# Patient Record
Sex: Female | Born: 1975 | Race: Black or African American | Hispanic: No | Marital: Single | Smoking: Never smoker
Health system: Southern US, Community
[De-identification: ages and names within clinical notes are randomized; demographics above are authoritative.]

## PROBLEM LIST (undated history)

## (undated) ENCOUNTER — Ambulatory Visit: Discharge status: Absent without leave | Payer: BC Managed Care – PPO

## (undated) DIAGNOSIS — E278 Other specified disorders of adrenal gland: Secondary | ICD-10-CM

## (undated) DIAGNOSIS — M549 Dorsalgia, unspecified: Secondary | ICD-10-CM

## (undated) DIAGNOSIS — M2550 Pain in unspecified joint: Secondary | ICD-10-CM

## (undated) DIAGNOSIS — E282 Polycystic ovarian syndrome: Secondary | ICD-10-CM

## (undated) DIAGNOSIS — G43009 Migraine without aura, not intractable, without status migrainosus: Secondary | ICD-10-CM

## (undated) DIAGNOSIS — N6001 Solitary cyst of right breast: Secondary | ICD-10-CM

## (undated) DIAGNOSIS — Z803 Family history of malignant neoplasm of breast: Secondary | ICD-10-CM

## (undated) DIAGNOSIS — C801 Malignant (primary) neoplasm, unspecified: Secondary | ICD-10-CM

## (undated) DIAGNOSIS — E559 Vitamin D deficiency, unspecified: Secondary | ICD-10-CM

## (undated) DIAGNOSIS — R87619 Unspecified abnormal cytological findings in specimens from cervix uteri: Secondary | ICD-10-CM

## (undated) DIAGNOSIS — M7989 Other specified soft tissue disorders: Secondary | ICD-10-CM

## (undated) DIAGNOSIS — K5900 Constipation, unspecified: Secondary | ICD-10-CM

## (undated) DIAGNOSIS — E7800 Pure hypercholesterolemia, unspecified: Secondary | ICD-10-CM

## (undated) DIAGNOSIS — R7303 Prediabetes: Secondary | ICD-10-CM

## (undated) DIAGNOSIS — B009 Herpesviral infection, unspecified: Secondary | ICD-10-CM

## (undated) HISTORY — PX: UMBILICAL HERNIA REPAIR: SHX196

## (undated) HISTORY — DX: Pure hypercholesterolemia, unspecified: E78.00

## (undated) HISTORY — DX: Family history of malignant neoplasm of breast: Z80.3

## (undated) HISTORY — DX: Migraine without aura, not intractable, without status migrainosus: G43.009

## (undated) HISTORY — DX: Herpesviral infection, unspecified: B00.9

## (undated) HISTORY — PX: COLPOSCOPY: SHX161

## (undated) HISTORY — DX: Dorsalgia, unspecified: M54.9

## (undated) HISTORY — DX: Solitary cyst of right breast: N60.01

## (undated) HISTORY — DX: Constipation, unspecified: K59.00

## (undated) HISTORY — DX: Other specified soft tissue disorders: M79.89

## (undated) HISTORY — DX: Vitamin D deficiency, unspecified: E55.9

## (undated) HISTORY — DX: Pain in unspecified joint: M25.50

## (undated) HISTORY — DX: Polycystic ovarian syndrome: E28.2

## (undated) HISTORY — DX: Unspecified abnormal cytological findings in specimens from cervix uteri: R87.619

## (undated) HISTORY — DX: Other specified disorders of adrenal gland: E27.8

---

## 2014-06-02 DIAGNOSIS — R87619 Unspecified abnormal cytological findings in specimens from cervix uteri: Secondary | ICD-10-CM

## 2014-06-02 HISTORY — DX: Unspecified abnormal cytological findings in specimens from cervix uteri: R87.619

## 2014-06-17 ENCOUNTER — Telehealth: Payer: Self-pay | Admitting: Obstetrics and Gynecology

## 2014-06-17 DIAGNOSIS — R87612 Low grade squamous intraepithelial lesion on cytologic smear of cervix (LGSIL): Secondary | ICD-10-CM

## 2014-06-17 NOTE — Telephone Encounter (Signed)
Pre-cert complete

## 2014-06-17 NOTE — Telephone Encounter (Signed)
Order placed for colposcopy.

## 2014-06-17 NOTE — Telephone Encounter (Signed)
Triage, Please enter order for colposcopy.  Thank you

## 2014-06-17 NOTE — Telephone Encounter (Signed)
Thank you for the update.  [place].

## 2014-06-17 NOTE — Telephone Encounter (Signed)
I called the referring doctor's office, [facility], to make sure the patient is aware she will be having a colposcopy at her new patient appointment. The doctor's nurse, Kathlee Nations, will be calling the patient to inform her of the procedure. Kathlee Nations will then call us back to confirm with [place] so we can schedule the patient.  Routing to [place] for pre-authorization per Dr. Quincy Simmonds.  Cc: Dr. Quincy Simmonds for [place].

## 2014-06-18 NOTE — Telephone Encounter (Signed)
Spoke with patient. Advised of message as seen below from [place]. Patient is agreeable. NGYN appointment scheduled for 07/01/2014 at [facility] with Dr.Silva. Patient is agreeable to date and time. Address provided to office.  Routing to provider for final review. Patient agreeable to disposition. Will close encounter

## 2014-06-18 NOTE — Telephone Encounter (Signed)
Spoke with patient. Patient is not currently on any form of birth control. Advised patient will need to call with first day of next menses to schedule NGYN appointment with colposcopy. "My cycles are really irregular. I had my last period right before my pap and before that I had not had my cycle in almost a year. I have no idea when my next one will be." Advised patient will speak with Dr.Silva regarding scheduling with cycles and return call to get appointment set up. Patient is agreeable.

## 2014-06-18 NOTE — Telephone Encounter (Signed)
Kathlee Nations from [facility] called to let our office know the patient is now aware she will be having a colposcopy procedure in our office when she comes in as a new patient. Routing to [place] to relay pre-cert information to patient.

## 2014-06-18 NOTE — Telephone Encounter (Signed)
Gail Erickson is to contact patient for scheduling as procedure needs to be scheduled based on cycle. Patient made aware of pre-cert details.

## 2014-06-18 NOTE — Telephone Encounter (Signed)
Schedule new GYN appointment first - history and exam.  I will need to understand her medical status first before we can schedule the colposcopy.

## 2014-06-25 ENCOUNTER — Telehealth: Payer: Self-pay | Admitting: Obstetrics and Gynecology

## 2014-06-25 NOTE — Telephone Encounter (Signed)
Spoke with patient. Advised patient okay to keep [place] appointment as scheduled for 07/01/2014 with Dr.Silva. Advised we will not be performing colposcopy at time of this appointment.  Advised this appointment will allow her to get to meet Dr.Silva and for them to review her medical history and discuss colposcopy. Patient is agreeable. "Did they only send my pap results to you? Or did they send my labs and mammogram history over as well?" Advised patient will speak with front desk supervisor regarding records that have been sent and return call tomorrow to let her know what we have on file. Patient is agreeable.

## 2014-06-25 NOTE — Telephone Encounter (Signed)
Pt states she was told to call back on first day of her cycle - pt started today. Pt is scheduled for NGYN referral for colpo. Is her scheduled appt on 4/20 still ok or does she need to reschedule sooner?

## 2014-06-26 NOTE — Telephone Encounter (Signed)
Left message to call [place] at [phone].  Need to advise. I have reviewed records received from referring office which include pap results only.

## 2014-06-29 NOTE — Telephone Encounter (Signed)
Spoke with patient. Advised patient I have reviewed incoming records from referring office and we have only received her pap smear. Patient will call to referring office to have other records sent as she would like Dr.Silva to have this on file. Advised once we have received these we will add them to her chart. Patient is agreeable.  Routing to provider for final review. Patient agreeable to disposition. Will close encounter

## 2014-07-01 ENCOUNTER — Encounter: Payer: Self-pay | Admitting: Obstetrics and Gynecology

## 2014-07-01 ENCOUNTER — Ambulatory Visit (INDEPENDENT_AMBULATORY_CARE_PROVIDER_SITE_OTHER): Payer: BLUE CROSS/BLUE SHIELD | Admitting: Obstetrics and Gynecology

## 2014-07-01 VITALS — BP 110/76 | HR 70 | Resp 20 | Ht 68.0 in | Wt 228.8 lb

## 2014-07-01 DIAGNOSIS — R87612 Low grade squamous intraepithelial lesion on cytologic smear of cervix (LGSIL): Secondary | ICD-10-CM | POA: Diagnosis not present

## 2014-07-01 DIAGNOSIS — N915 Oligomenorrhea, unspecified: Secondary | ICD-10-CM | POA: Diagnosis not present

## 2014-07-01 DIAGNOSIS — Z803 Family history of malignant neoplasm of breast: Secondary | ICD-10-CM

## 2014-07-01 NOTE — Progress Notes (Signed)
Patient ID: Gail Erickson, female   DOB: 1975/04/29, 39 y.o.   MRN: [id number] GYNECOLOGY VISIT  PCP:  Marlou Porch, MD  Referring provider:   HPI: 39 y.o.   Single  African American  female   [place]aden with Patient's last menstrual period was 06/25/2014 (exact date).   here for evaluation of abnormal pap smear.  Patient states this is her first abnormal pap smear.    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  No HR HPV testing done.   Irregular menses - June 2015, March 2016, and April 2016.  History of irregular menses life long.  At most 3 cycles per year.  No prior evaluation or hormonal testing.  If looses weight, menses are more regular.  Some acne.  No increased hair growth since doing laser to her face. Some night sweats.   Condoms/vasectomy  for contraception.   FH of breast cancer - mother age 45, maternal cousin died of breat cancer age 10, paternal aunt breast cancer in her 23s.  No family history of ovarian cancer.    GYNECOLOGIC HISTORY: Patient's last menstrual period was 06/25/2014 (exact date). Sexually active:  yes Partner preference: female Contraception:  Condoms sometimes and vasectomy.  Menopausal hormone therapy: n/a DES exposure: no   Blood transfusions:  no Sexually transmitted diseases:  no  GYN procedures and prior surgeries:  no Last mammogram: 05-29-14 dense/nl:[place], [place]               Last pap and high risk HPV testing:  LSIL:no HPV testing--in [place], [place]  History of abnormal pap smear:  no   OB History    Gravida Para Term Preterm AB TAB SAB Ectopic Multiple Living   0 0 0 0 0 0 0 0 0 0        Past Medical History  Diagnosis Date  . Cyst of right breast     --stable in 2015 U/S done in [place], [place]    Past Surgical History  Procedure Laterality Date  . Umbilical hernia repair      --as a child    Current Outpatient Prescriptions  Medication Sig Dispense Refill  . ciclopirox (PENLAC) 8 % solution Apply 1 application topically daily.  0  . Mefenamic  Acid 250 MG CAPS   2  . nystatin-triamcinolone (MYCOLOG II) cream Apply 1 application topically as needed.  0  . Vitamin D, Ergocalciferol, (DRISDOL) 50000 UNITS CAPS capsule Take 1 capsule by mouth once a week.  0   No current facility-administered medications for this visit.     ALLERGIES: Review of patient's allergies indicates no known allergies.  Family History  Problem Relation Age of Onset  . Breast cancer Mother 55  . Diabetes Mother   . Hypertension Mother   . Hyperlipidemia Mother   . Thyroid disease Mother   . Breast cancer Paternal Aunt 92    bil. Breast ca--deceased CHF  . Breast cancer Cousin 35    Dec Breast ca age 66  . Hyperlipidemia Father     History   Social History  . Marital Status: Single    Spouse Name: N/A  . Number of Children: N/A  . Years of Education: N/A   Occupational History  . Not on file.   Social History Main Topics  . Smoking status: Never Smoker   . Smokeless tobacco: Not on file  . Alcohol Use: 0.0 oz/week    0 Standard drinks or equivalent per week     Comment: 1 drink per month  .  Drug Use: No  . Sexual Activity:    Partners: Male     Comment: Condoms-sometimes   Other Topics Concern  . Not on file   Social History Narrative  . No narrative on file    ROS:  Pertinent items are noted in HPI.  PHYSICAL EXAMINATION:    BP 110/76 mmHg  Pulse 70  Resp 20  Ht 5\' 8"  (1.727 m)  Wt 228 lb 12.8 oz (103.783 kg)  BMI 34.80 kg/m2  LMP 06/25/2014 (Exact Date)   Wt Readings from Last 3 Encounters:  07/01/14 228 lb 12.8 oz (103.783 kg)     Ht Readings from Last 3 Encounters:  07/01/14 5\' 8"  (1.727 m)    General appearance: alert, cooperative and appears stated age Head: Normocephalic, without obvious abnormality, atraumatic Neck: no adenopathy, supple, symmetrical, trachea midline and thyroid not enlarged, symmetric, no tenderness/mass/nodules Lungs: clear to auscultation bilaterally Breasts: Inspection negative, No  nipple retraction or dimpling, No nipple discharge or bleeding, No axillary or supraclavicular adenopathy, Normal to palpation without dominant masses Heart: regular rate and rhythm Abdomen: soft, non-tender; no masses,  no organomegaly Extremities: extremities normal, atraumatic, no cyanosis or edema Skin: Skin color, texture, turgor normal. No rashes or lesions Lymph nodes: Cervical, supraclavicular, and axillary nodes normal. No abnormal inguinal nodes palpated Neurologic: Grossly normal  Pelvic: External genitalia:  no lesions              Urethra:  normal appearing urethra with no masses, tenderness or lesions              Bartholins and Skenes: normal                 Vagina: normal appearing vagina with normal color and discharge, no lesions              Cervix: normal appearance                 Bimanual Exam:  Uterus:  uterus is normal size, shape, consistency and nontender                                      Adnexa: normal adnexa in size, nontender and no masses                                    ASSESSMENT  LGSIL pap.  FH of breast cancer.  Oligomenorrhea.  PLAN  PCOS labs.  See EPIC.  Discussion of PCOS.  Discussion of abnormal paps and HVP. Return for colposcopy.  Procedure explained.  Referral to cancer center for genetics counseling and potential testing.    __30_____ minutes face to face time of which over 50% was spent in counseling.    An After Visit Summary was printed and given to the patient.

## 2014-07-01 NOTE — Patient Instructions (Signed)
Abnormal Pap Test Information During a Pap test, the cells on the surface of your cervix are checked to see if they look normal, abnormal, or if they show signs of having been altered by a certain type of virus called human papillomavirus, or HPV. Cervical cells that have been affected by HPV are called dysplasia. Dysplasia is not cancer, but describes abnormal cells found on the surface of the cervix. Depending on the degree of dysplasia, some of the cells may be considered pre-cancerous and may turn into cancer over time if follow up with a caregiver is delayed.  WHAT DOES AN ABNORMAL PAP TEST MEAN? Having an abnormal pap test does not mean that you have cancer. However, certain types of abnormal pap tests can be a sign that a person is at a higher risk of developing cancer. Your caregiver will want to do other tests to find out more about the abnormal cells. Your abnormal Pap test results could show:   Small and uncertain changes that should be carefully watched.   Cervical dysplasia that has caused mild changes and can be followed over time.  Cervical dysplasia that is more severe and needs to be followed and treated to ensure the problem goes away.  Cancer.  When severe cervical dysplasia is found and treated early, it rarely will grow into cancer.  WHAT WILL BE DONE ABOUT MY ABNORMAL PAP TEST?  A colposcopy may be needed. This is a procedure where your cervix is examined using light and magnification.  A small tissue sample of your cervix (biopsy) may need to be removed and then examined. This is often performed if there are areas that appear infected.  A sample of cells from the cervical canal may be removed with either a small brush or scraping instrument (curette). Based on the results of the procedures above, some caregivers may recommend either cryotherapy of the cervix or a surgical LEEP where a portion of the cervix is removed. LEEP is short for "loop electrical excisional  procedure." Rarely, a caregiver may recommend a cone biopsy.This is a procedure where a small, cone-shaped sample of your cervix is taken out. The part that is taken out is the area where the abnormal cells are.  WHAT IF I HAVE A DYSPLASIA OR A CANCER? You may be referred to a specialist. Radiation may also be a treatment for more advanced cancer. Having a hysterectomy is the last treatment option for dysplasia, but it is a more common treatment for someone with cancer. All treatment options will be discussed with you by your caregiver. WHAT SHOULD YOU DO AFTER BEING TREATED? If you have had an abnormal pap test, you should continue to have regular pap tests and check-ups as directed by your caregiver. Your cervical problem will be carefully watched so it does not get worse. Also, your caregiver can watch for, and treat, any new problems that may come up. Document Released: 06/14/2010 Document Revised: 06/24/2012 Document Reviewed: 02/23/2011 [facility] Patient Information 2015 [place], [place]. This information is not intended to replace advice given to you by your health care provider. Make sure you discuss any questions you have with your health care provider.  Polycystic Ovarian Syndrome Polycystic ovarian syndrome (PCOS) is a common hormonal disorder among women of reproductive age. Most women with PCOS grow many small cysts on their ovaries. PCOS can cause problems with your periods and make it difficult to get pregnant. It can also cause an increased risk of miscarriage with pregnancy. If left untreated, PCOS  can lead to serious health problems, such as diabetes and heart disease. CAUSES The cause of PCOS is not fully understood, but genetics may be a factor. SIGNS AND SYMPTOMS   Infrequent or no menstrual periods.   Inability to get pregnant (infertility) because of not ovulating.   Increased growth of hair on the face, chest, stomach, back, thumbs, thighs, or toes.   Acne, oily skin,  or dandruff.   Pelvic pain.   Weight gain or obesity, usually carrying extra weight around the waist.   Type 2 diabetes.   High cholesterol.   High blood pressure.   Female-pattern baldness or thinning hair.   Patches of thickened and dark brown or black skin on the neck, arms, breasts, or thighs.   Tiny excess flaps of skin (skin tags) in the armpits or neck area.   Excessive snoring and having breathing stop at times while asleep (sleep apnea).   Deepening of the voice.   Gestational diabetes when pregnant.  DIAGNOSIS  There is no single test to diagnose PCOS.   Your health care provider will:   Take a medical history.   Perform a pelvic exam.   Have ultrasonography done.   Check your female and female hormone levels.   Measure glucose or sugar levels in the blood.   Do other blood tests.   If you are producing too many female hormones, your health care provider will make sure it is from PCOS. At the physical exam, your health care provider will want to evaluate the areas of increased hair growth. Try to allow natural hair growth for a few days before the visit.   During a pelvic exam, the ovaries may be enlarged or swollen because of the increased number of small cysts. This can be seen more easily by using vaginal ultrasonography or screening to examine the ovaries and lining of the uterus (endometrium) for cysts. The uterine lining may become thicker if you have not been having a regular period.  TREATMENT  Because there is no cure for PCOS, it needs to be managed to prevent problems. Treatments are based on your symptoms. Treatment is also based on whether you want to have a baby or whether you need contraception.  Treatment may include:   Progesterone hormone to start a menstrual period.   Birth control pills to make you have regular menstrual periods.   Medicines to make you ovulate, if you want to get pregnant.   Medicines to control  your insulin.   Medicine to control your blood pressure.   Medicine and diet to control your high cholesterol and triglycerides in your blood.  Medicine to reduce excessive hair growth.  Surgery, making small holes in the ovary, to decrease the amount of female hormone production. This is done through a long, lighted tube (laparoscope) placed into the pelvis through a tiny incision in the lower abdomen.  HOME CARE INSTRUCTIONS  Only take over-the-counter or prescription medicine as directed by your health care provider.  Pay attention to the foods you eat and your activity levels. This can help reduce the effects of PCOS.  Keep your weight under control.  Eat foods that are low in carbohydrate and high in fiber.  Exercise regularly. SEEK MEDICAL CARE IF:  Your symptoms do not get better with medicine.  You have new symptoms. Document Released: 06/23/2004 Document Revised: 12/18/2012 Document Reviewed: 08/15/2012 [facility] Patient Information 2015 [place], [place]. This information is not intended to replace advice given to you by your  health care provider. Make sure you discuss any questions you have with your health care provider.  BRCA-1 and BRCA-2 BRCA-1 and BRCA-2 are 2 genes that are linked with hereditary breast and ovarian cancers. About 200,000 women are diagnosed with invasive breast cancer each year and about 23,000 with ovarian cancer (according to the [place]). Of these cancers, about 5% to 10% will be due to a mutation in one of the BRCA genes. Men can also inherit an increased risk of developing breast cancer, primarily from an alteration in the BRCA-2 gene.  Individuals with mutations in BRCA1 or BRCA2 have significantly elevated risks for breast cancer (up to 80% lifetime risk), ovarian cancer (up to 40% lifetime risk), bilateral breast cancer and other types of cancers. BRCA mutations are inherited and passed from generation to generation. One half of  the time, they are passed from the father's side of the family.  The DNA in white blood cells is used to detect mutations in the BRCA genes. While the gene products (proteins) of the BRCA genes act only in breast and ovarian tissue, the genes are present in every cell of the body and blood is the most easily accessible source of that DNA. PREPARATION FOR TEST The test for BRCA mutations is done on a blood sample collected by needle from a vein in the arm. The test does not require surgical biopsy of breast or ovarian tissue.  NORMAL FINDINGS No genetic mutations. Ranges for normal findings may vary among different laboratories and hospitals. You should always check with your doctor after having lab work or other tests done to discuss the meaning of your test results and whether your values are considered within normal limits. MEANING OF TEST  Your caregiver will go over the test results with you and discuss the importance and meaning of your results, as well as treatment options and the need for additional tests if necessary. OBTAINING THE TEST RESULTS It is your responsibility to obtain your test results. Ask the lab or department performing the test when and how you will get your results. OTHER THINGS TO KNOW Your test results may have implications for other family members. When one member of a family is tested for BRCA mutations, issues often arise about how or whether to share this information with other family members. Seek advice from a genetic counselor about communication of result with your family members.  Pre and post test consultation with a health care provider knowledgeable about genetic testing cannot be overemphasized.  There are many issues to be considered when preparing for a genetic test and upon learning the results, and a genetic counselor has the knowledge and experience to help you sort through them.  If the BRCA test is positive, the options include increased frequency of  check-ups (e.g., mammography, blood tests for CA-125, or transvaginal ultrasonography); medications that could reduce risk (e.g., oral contraceptives or tamoxifen); or surgical removal of the ovaries or breasts. There are a number of variables involved and it is important to discuss your options with your doctor and genetic counselor. Research studies have reported that for every 1000 women negative for BRCA mutations, between 12 and 28 of them will develop breast cancer by age 1 and between 76 and 46 will develop ovarian cancer by age 84. The risk increases with age. The test can be ordered by a doctor, preferably by one who can also offer genetic counseling. The blood sample will be sent to a laboratory that specializes in  BRCA testing. The American Society of Clinical Oncology and the [place] encourage women seeking the test to participate in long-term outcome studies to help gather information on the effectiveness of different check-up and treatment options. Document Released: 03/23/2004 Document Revised: 05/22/2011 Document Reviewed: 05/30/2013 [facility] Patient Information 2015 [place], [place]. This information is not intended to replace advice given to you by your health care provider. Make sure you discuss any questions you have with your health care provider.

## 2014-07-02 LAB — DHEA-SULFATE: DHEA-SO4: 389 ug/dL — ABNORMAL HIGH (ref 23–266)

## 2014-07-02 LAB — TESTOSTERONE: TESTOSTERONE: 53 ng/dL (ref 10–70)

## 2014-07-02 LAB — TSH: TSH: 0.596 u[IU]/mL (ref 0.350–4.500)

## 2014-07-02 LAB — ESTRADIOL: Estradiol: 40.5 pg/mL

## 2014-07-02 LAB — FSH/LH
FSH: 7.3 m[IU]/mL
LH: 6.1 m[IU]/mL

## 2014-07-02 LAB — HEMOGLOBIN A1C
Hgb A1c MFr Bld: 5.1 % (ref <5.7)
Mean Plasma Glucose: 100 mg/dL (ref <117)

## 2014-07-02 LAB — PROLACTIN: Prolactin: 9.2 ng/mL

## 2014-07-05 LAB — 17-HYDROXYPROGESTERONE: 17-OH-Progesterone, LC/MS/MS: 39 ng/dL

## 2014-07-09 ENCOUNTER — Telehealth: Payer: Self-pay | Admitting: Obstetrics and Gynecology

## 2014-07-09 ENCOUNTER — Telehealth: Payer: Self-pay | Admitting: *Deleted

## 2014-07-09 NOTE — Telephone Encounter (Signed)
Call to patient. Advised of benefit quote received for colpo.  Patient agreeable. Scheduled procedure.

## 2014-07-09 NOTE — Telephone Encounter (Signed)
Tried calling patient got a message that says "The mailbox is full and cannot accept new messages, goodbye".

## 2014-07-09 NOTE — Telephone Encounter (Signed)
-----   Message from [name], MD sent at 07/06/2014  7:59 PM EDT ----- Please report lab results to patient.  Her DHEAS is elevated, which can occur with polycystic ovarian disease.  All other labs are normal.   I will see patient for her colposcopy.

## 2014-07-13 ENCOUNTER — Encounter: Payer: Self-pay | Admitting: Obstetrics and Gynecology

## 2014-07-13 ENCOUNTER — Ambulatory Visit (INDEPENDENT_AMBULATORY_CARE_PROVIDER_SITE_OTHER): Payer: BLUE CROSS/BLUE SHIELD | Admitting: Obstetrics and Gynecology

## 2014-07-13 VITALS — BP 126/80 | HR 64 | Ht 68.0 in | Wt 228.0 lb

## 2014-07-13 DIAGNOSIS — R87612 Low grade squamous intraepithelial lesion on cytologic smear of cervix (LGSIL): Secondary | ICD-10-CM

## 2014-07-13 DIAGNOSIS — Z803 Family history of malignant neoplasm of breast: Secondary | ICD-10-CM | POA: Diagnosis not present

## 2014-07-13 DIAGNOSIS — N915 Oligomenorrhea, unspecified: Secondary | ICD-10-CM

## 2014-07-13 NOTE — Progress Notes (Signed)
Subjective:     Patient ID: Gail Erickson, female   DOB: 10-16-1975, 39 y.o.   MRN: [id number]  HPI  Here for colposcopy for pap showing LGSIL.  No HR HPV was done.   LMP 06/25/14  BC - vasectomy.   FH of breast CA.  Doing genetic counseling on 07/30/14.   PCOS labs showed elevated DHEAS, o/w normal.   Review of Systems     Objective:   Physical Exam  Genitourinary:        Colposcopy Consent for procedure.  Speculum placed.  3% acetic acid used.  Satisfactory colposcopy.  Acetowhite patch at 12:00 and thickened acetowhite rim at 6 - 8:00. ECC, biopsy at 8:00 and biopsy at 12:00 sent to pathology separately.  Monsel's placed.  Minimal EBL.   Colposcopy of the vulva with 3% acetic acid soaked gauze.  No lesions.   No complications.     Assessment:     LGSIL pap.  No HR HPV status.  Oligomenorrhea.  FH of breast cancer.     Plan:     Discussion of HPV and abnormal paps. Instructions and precautions given. Discussion of oligomenorrhea and need for regularity of cycles.  Discussed hormonal options.  Complete genetic testing.  Follow up in 2 months to create comprehensive plan regarding all of the above.   __15_____ minutes face to face time of which over 50% was spent in counseling.   After visit summary to patient.

## 2014-07-13 NOTE — Patient Instructions (Signed)

## 2014-07-14 NOTE — Telephone Encounter (Signed)
Patient notified of results at 07/13/14 Office Visit.

## 2014-07-15 LAB — IPS OTHER TISSUE BIOPSY

## 2014-07-16 ENCOUNTER — Telehealth: Payer: Self-pay

## 2014-07-16 NOTE — Telephone Encounter (Signed)
Spoke with patient. Advised of message as seen below from [place]. Patient is agreeable and verbalizes understanding. Patient does not wish to proceed with subtyping at this time. 08 recall entered.  Routing to provider for final review. Patient agreeable to disposition. Patient aware MD will review message and nurse will return call with any additional instructions or change of disposition. Will close encounter.

## 2014-07-16 NOTE — Telephone Encounter (Signed)
-----   Message from [name], MD sent at 07/16/2014  5:54 AM EDT ----- Please report colpo pathology to patient.  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  LGSIL can be caused by either low risk types of HPV or high risk types of HPV.   My recommendation is for co-testing in 12 months, which is the protocol. Subtyping the virus at this point would not change the recommendations but can be done for an extra charge to the patient by the laboratory.  They did not do any special staining as they did not see any high grade disease.   Please enter recall - 08 for LGSIL.  Cc- Marisa Sprinkles

## 2014-07-29 ENCOUNTER — Encounter (HOSPITAL_COMMUNITY): Payer: Self-pay | Admitting: Hematology & Oncology

## 2014-07-29 ENCOUNTER — Encounter (HOSPITAL_COMMUNITY): Payer: BLUE CROSS/BLUE SHIELD | Attending: Hematology & Oncology | Admitting: Hematology & Oncology

## 2014-07-29 ENCOUNTER — Ambulatory Visit (HOSPITAL_COMMUNITY): Payer: Self-pay | Admitting: Hematology & Oncology

## 2014-07-29 VITALS — BP 115/78 | HR 65 | Temp 98.6°F | Resp 20 | Ht 69.0 in | Wt 234.0 lb

## 2014-07-29 DIAGNOSIS — Z808 Family history of malignant neoplasm of other organs or systems: Principal | ICD-10-CM

## 2014-07-29 DIAGNOSIS — Z803 Family history of malignant neoplasm of breast: Secondary | ICD-10-CM | POA: Diagnosis not present

## 2014-07-29 DIAGNOSIS — Z809 Family history of malignant neoplasm, unspecified: Secondary | ICD-10-CM

## 2014-07-29 NOTE — Progress Notes (Signed)
[place] at [place] NOTE   CHIEF COMPLAINTS/PURPOSE OF CONSULTATION:  Family history of breast cancer  HISTORY OF PRESENTING ILLNESS:  Gail Erickson 39 y.o. female is here for consultation regarding a family history of breast cancer. Patient is interested in genetic testing and questions if she qualifies. Her mother was diagnosed with breast cancer at the age of 58 and underwent a right mastectomy, chemotherapy. Her mother is now 45. Mother, 70 yo, diagnosed with breast cancer at 40 yo mastectomy on right side and chemotherapy, no radiation.Her mother was never tested genetically to her knowledge. Her mother had multiple siblings but none of them had malignancy. She denies any family history of ovarian cancer.  She notes that her father sister had breast cancer" both breasts" in her mid to late 74s. She did not die from breast cancer but of heart issues. She also states her mother's first cousin died of breast cancer in her late 40s.  She states she has read a lot about breast cancer risk and "she has accepted that not having children at her age puts her at higher risk." First menstrual cycle at age 63. She is not menopausal. She describes her menstrual cycle as very irregular.She has a Higher education careers adviser at [employer] and has been meaning to go more frequently.  She recently saw her gynecologist and was discussing birth control options. She reports he wanted her to speak with an oncologist in regards to breast cancer risk given her family history. She is up-to-date on mammography with her last one on March 18.  MEDICAL HISTORY:  Past Medical History  Diagnosis Date  . Cyst of right breast     --stable in 2015 U/S done in [place], [place]  . Abnormal Pap smear of cervix 06-02-14    LGSIL:neg HR HPV (1st abn. pap)    SURGICAL HISTORY: Past Surgical History  Procedure Laterality Date  . Umbilical hernia repair      --as a child    SOCIAL HISTORY: History   Social  History  . Marital Status: Single    Spouse Name: N/A  . Number of Children: N/A  . Years of Education: N/A   Occupational History  . Not on file.   Social History Main Topics  . Smoking status: Never Smoker   . Smokeless tobacco: Not on file  . Alcohol Use: 0.0 oz/week    0 Standard drinks or equivalent per week     Comment: 1 drink per month  . Drug Use: No  . Sexual Activity:    Partners: Male     Comment: Condoms-sometimes   Other Topics Concern  . Not on file   Social History Narrative  Single. No children. Works for [employer] in [place] Non smoker ETOH, glass of wine twice a month.   FAMILY HISTORY: Family History  Problem Relation Age of Onset  . Breast cancer Mother 13  . Diabetes Mother   . Hypertension Mother   . Hyperlipidemia Mother   . Thyroid disease Mother   . Breast cancer Paternal Aunt 61    bil. Breast ca--deceased CHF  . Breast cancer Cousin 35    Dec Breast ca age 40  . Hyperlipidemia Father    indicated that her mother is alive. She indicated that her father is alive. She indicated that her brother is alive.   Mother, 62 yo, diagnosed with breast cancer at 40 yo mastectomy on right side and chemotherapy, no radiation. Father, 41  yo, hypertension Her mother was never tested genetically to her knowledge. One older brother, healthy. Mother had brothers and sisters, none of them had cancer. No family history of ovarian cancer. Father's side, father sister had cancer in both breasts in mid to late July 10, 2022, she died of heart issues. Mother's side, first cousin died of breast cancer, she died late 54s-early 40s. She had her first child, was diagnosed with breast cancer No prostate cancer in the immediate family.  Mom had a first cousin who died of colon cancer. The first cousins son had prostate cancer but that is the only person she knows of with prostate cancer. Maternal grandmother, 43 yo, died of rare form of adrenal cancer. Paternal  grandmother, thinks she died of breast cancer but is unsure  ALLERGIES:  has No Known Allergies.  MEDICATIONS:  Current Outpatient Prescriptions  Medication Sig Dispense Refill  . ciclopirox (PENLAC) 8 % solution Apply 1 application topically daily.  0  . Mefenamic Acid 250 MG CAPS   2  . nystatin-triamcinolone (MYCOLOG II) cream Apply 1 application topically as needed.  0  . Vitamin D, Ergocalciferol, (DRISDOL) 50000 UNITS CAPS capsule Take 1 capsule by mouth once a week.  0   No current facility-administered medications for this visit.    Review of Systems  Constitutional: Negative for fever, chills, weight loss and malaise/fatigue.  HENT: Negative for congestion, hearing loss, nosebleeds, sore throat and tinnitus.   Eyes: Negative for blurred vision, double vision, pain and discharge.  Respiratory: Negative for cough, hemoptysis, sputum production, shortness of breath and wheezing.   Cardiovascular: Negative for chest pain, palpitations, claudication, leg swelling and PND.  Gastrointestinal: Negative for heartburn, nausea, vomiting, abdominal pain, diarrhea, constipation, blood in stool and melena.  Genitourinary: Negative for dysuria, urgency, frequency and hematuria.  Musculoskeletal: Negative for myalgias, back pain, joint pain, falls and neck pain.  Skin: Negative for itching and rash.  Neurological: Positive for headaches. Negative for dizziness, tingling, tremors, sensory change, speech change, focal weakness, seizures, loss of consciousness and weakness.  Endo/Heme/Allergies: Does not bruise/bleed easily.  Psychiatric/Behavioral: Negative for depression, suicidal ideas, memory loss and substance abuse. The patient is not nervous/anxious and does not have insomnia.    14 point ROS was done and is otherwise as detailed above or in HPI   PHYSICAL EXAMINATION:   ECOG PERFORMANCE STATUS: 0 - Asymptomatic  Filed Vitals:   07/29/14 1519  BP: 115/78  Pulse: 65  Temp: 98.6  F (37 C)  Resp: 20   Filed Weights   07/29/14 1519  Weight: 234 lb (106.142 kg)     Physical Exam  Constitutional: She is oriented to person, place, and time and well-developed, well-nourished, and in no distress.  HENT:  Head: Normocephalic and atraumatic.  Nose: Nose normal.  Mouth/Throat: Oropharynx is clear and moist. No oropharyngeal exudate.  Eyes: Conjunctivae and EOM are normal. Pupils are equal, round, and reactive to light. Right eye exhibits no discharge. Left eye exhibits no discharge. No scleral icterus.  Neck: Normal range of motion. Neck supple. No tracheal deviation present. No thyromegaly present.  Cardiovascular: Normal rate, regular rhythm and normal heart sounds.  Exam reveals no gallop and no friction rub.   No murmur heard. Pulmonary/Chest: Effort normal and breath sounds normal. She has no wheezes. She has no rales.  Abdominal: Soft. Bowel sounds are normal. She exhibits no distension and no mass. There is no tenderness. There is no rebound and no guarding.  Musculoskeletal: Normal range  of motion. She exhibits no edema.  Lymphadenopathy:    She has no cervical adenopathy.  Neurological: She is alert and oriented to person, place, and time. She has normal reflexes. No cranial nerve deficit. Gait normal. Coordination normal.  Skin: Skin is warm and dry. No rash noted.  Psychiatric: Mood, memory, affect and judgment normal.  Nursing note and vitals reviewed.  Breast exam deferred per patient request, she just had one with her gynecologist.   ASSESSMENT & PLAN:  Family history of breast cancer in her mother at an early age Nulliparity  We discussed risks and benefits of genetic testing. We discussed the limitations of genetic testing. I advised her it would be particularly beneficial if her mother had been tested since she has a history of breast cancer at an early age. The patient states her mother does not recall ever being offered that since her diagnosis  and treatment years ago.   The patient is very interested in pursuing genetic counseling. Given her family history I feel she is a candidate for genetic counseling. We will therefore refer her to [facility]. She would like one additional follow-up with me after her testing is complete for additional discussion regarding breast cancer prevention and to review results, especially if positive.  All questions were answered. The patient knows to call the clinic with any problems, questions or concerns.  This note was electronically signed.    This document serves as a record of services personally performed by Ancil Linsey, MD. It was created on her behalf by Arlyce Harman, a trained medical scribe. The creation of this record is based on the scribe's personal observations and the provider's statements to them. This document has been checked and approved by the attending provider.  I have reviewed the above documentation for accuracy and completeness, and I agree with the above.  Molli Hazard, MD

## 2014-07-29 NOTE — Patient Instructions (Addendum)
[facility] at [facility] Discharge Instructions  RECOMMENDATIONS MADE BY THE CONSULTANT AND ANY TEST RESULTS WILL BE SENT TO YOUR REFERRING PHYSICIAN.  Genetic counseling on May 31 @ 10am. Come to [facility] 4th floor and sign in.   If you are negative on your genetic screening test you will not have to follow up with Dr. Whitney Muse but if you test positive you will be seen back.   Thank you for choosing [place] at [facility] to provide your oncology and hematology care.  To afford each patient quality time with our provider, please arrive at least 15 minutes before your scheduled appointment time.    You need to re-schedule your appointment should you arrive 10 or more minutes late.  We strive to give you quality time with our providers, and arriving late affects you and other patients whose appointments are after yours.  Also, if you no show three or more times for appointments you may be dismissed from the clinic at the providers discretion.     Again, thank you for choosing [facility].  Our hope is that these requests will decrease the amount of time that you wait before being seen by our physicians.       _____________________________________________________________  Should you have questions after your visit to [facility], please contact our office at [phone] between the hours of 8:30 a.m. and 4:30 p.m.  Voicemails left after 4:30 p.m. will not be returned until the following business day.  For prescription refill requests, have your pharmacy contact our office.

## 2014-08-11 ENCOUNTER — Encounter (HOSPITAL_COMMUNITY): Payer: Self-pay | Admitting: Genetic Counselor

## 2014-08-11 ENCOUNTER — Encounter (HOSPITAL_BASED_OUTPATIENT_CLINIC_OR_DEPARTMENT_OTHER): Payer: BLUE CROSS/BLUE SHIELD | Admitting: Genetic Counselor

## 2014-08-11 DIAGNOSIS — Z803 Family history of malignant neoplasm of breast: Secondary | ICD-10-CM

## 2014-08-11 DIAGNOSIS — Z809 Family history of malignant neoplasm, unspecified: Secondary | ICD-10-CM

## 2014-08-11 DIAGNOSIS — Z8051 Family history of malignant neoplasm of kidney: Secondary | ICD-10-CM | POA: Diagnosis not present

## 2014-08-11 DIAGNOSIS — Z315 Encounter for genetic counseling: Secondary | ICD-10-CM

## 2014-08-11 DIAGNOSIS — Z808 Family history of malignant neoplasm of other organs or systems: Secondary | ICD-10-CM

## 2014-08-11 NOTE — Progress Notes (Signed)
REFERRING PROVIDER: Conley Simmonds, MD  Gail Messing, MD  PRIMARY PROVIDER:  No PCP Per Patient  PRIMARY REASON FOR VISIT:  1. Family history of cancer in mother   2. Family history of breast cancer      HISTORY OF PRESENT ILLNESS:   Gail Erickson, a 39 y.o. female, was seen for a [place] cancer genetics consultation at the request of Dr. Conley Simmonds due to a family history of cancer.  Gail Erickson presents to clinic today to discuss the possibility of a hereditary predisposition to cancer, genetic testing, and to further clarify her future cancer risks, as well as potential cancer risks for family members. Gail Erickson is a 39 y.o. female with no personal history of cancer.    CANCER HISTORY:   No history exists.     HORMONAL RISK FACTORS:  Menarche was at age 64-13.  First live birth at age N/A.  OCP use for approximately 0 years.  Ovaries intact: yes.  Hysterectomy: no.  Menopausal status: premenopausal.  HRT use: 0 years. Colonoscopy: no; not examined. Mammogram within the last year: yes. Number of breast biopsies: 0. Up to date with pelvic exams:  yes. Any excessive radiation exposure in the past:  no  Past Medical History  Diagnosis Date  . Cyst of right breast     --stable in 2015 U/S done in [place], [place]  . Abnormal Pap smear of cervix 06-02-14    LGSIL:neg HR HPV (1st abn. pap)  . Family history of breast cancer     Past Surgical History  Procedure Laterality Date  . Umbilical hernia repair      --as a child    History   Social History  . Marital Status: Single    Spouse Name: N/A  . Number of Children: 0  . Years of Education: N/A   Social History Main Topics  . Smoking status: Never Smoker   . Smokeless tobacco: Not on file  . Alcohol Use: 0.0 oz/week    0 Standard drinks or equivalent per week     Comment: 1 drink per month  . Drug Use: No  . Sexual Activity:    Partners: Male     Comment: Condoms-sometimes   Other Topics Concern  . None    Social History Narrative     FAMILY HISTORY:  We obtained a detailed, 4-generation family history.  Significant diagnoses are listed below: Family History  Problem Relation Age of Onset  . Breast cancer Mother 5  . Diabetes Mother   . Hypertension Mother   . Hyperlipidemia Mother   . Thyroid disease Mother   . Breast cancer Paternal Aunt 45    bil. Breast ca--deceased CHF  . Breast cancer Cousin 27    maternal first cousin died Breast ca age 74  . Hyperlipidemia Father   . HIV/AIDS Maternal Uncle   . Cancer Paternal Uncle     NOS - needed BMT  . Cancer Maternal Grandmother 56    adrenal gland cancer  . Breast cancer Paternal Grandmother     dx in her mid to late 31s  . Aneurysm Maternal Aunt 26    Brain  . Kidney disease Maternal Uncle   . Cancer Other     2 maternal great uncles with cancer NOS   The patient has one brother who is healthy and cancer free.  Her mother was diagnosed with breast cancer at age 8, and is currently 67 without an additional cancer diagnosis.  Her mother had three brothers and three sisters.  One sister died of a brain aneursym at age 50, one brother died of HIV/AIDS and another brother died of kidney disease.  This last brother had a daughter with breast cancer diagnosed in her early 67s and died at age 77.  Gail Erickson maternal grandmother was diagnosed with adrenal gland cancer and died at 42.  She had two brothers who had a Cancer NOS and a sister who had a daughter with rectal cancer.  Gail Erickson father had two brothers and a sister.  His sister died of bilateral breast cancer, which was diagnosed at 55.  One brother had a cancer NOS in which he has needed a bone marrow transplant.  Her paternal grandmother also had breast cancer and died in her late 55s.   Patient's maternal ancestors are of Caucasian and African American descent, and paternal ancestors are of African American descent. There is no reported Ashkenazi Jewish ancestry. There is no  known consanguinity.  GENETIC COUNSELING ASSESSMENT: Gail Erickson is a 39 y.o. 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 We, therefore, discussed and recommended the following at today's visit.   DISCUSSION: We reviewed the characteristics, features and inheritance patterns of hereditary cancer syndromes. We also discussed genetic testing, including the appropriate family members to test, the process of testing, insurance coverage and turn-around-time for results. We discussed the implications of a negative, positive and/or variant of uncertain significant result. We recommended Gail Erickson pursue genetic testing for the Breast/Ovarian cancer gene panel. The Breast/Ovarian gene panel offered by [facility] includes sequencing and rearrangement analysis for the following 21 genes:  ATM, BARD1, BRCA1, BRCA2, BRIP1, CDH1, CHEK2, EPCAM, FANCC, MLH1, MSH2, MSH6, NBN, PALB2, PMS2, PTEN, RAD51C, RAD51D, STK11, TP53, and XRCC2.     Based on Gail Erickson's family history of cancer, she meets medical criteria for genetic testing. Despite that she meets criteria, she may still have an out of pocket cost. We discussed that if her out of pocket cost for testing is over $100, the laboratory will call and confirm whether she wants to proceed with testing.  If the out of pocket cost of testing is less than $100 she will be billed by the genetic testing laboratory.   A risk assessment through the use of Tobey Bride Cusik could not be performed today because of the location of our clinic and unavailability of the tool.  However, based on her family history, Gail Erickson may need to consider a breast MRI for screening.  We discussed that I will perform a Tyrer Cusik to determine this risk.  PLAN: Despite our recommendation, Gail Erickson did not wish to pursue genetic testing at today's visit. She feels that she wants to think about this information and  determine whether this is the best time to do the testing.  We understand this decision, and remain available to coordinate genetic testing at any time in the future. We; therefore, recommend Gail Erickson continue to follow the cancer screening guidelines given by her primary healthcare provider.  Based on Gail Erickson's family history, we recommended her mother, who was diagnosed with breast cancer at age 29, have genetic counseling and testing. Gail Erickson will let us know if we can be of any assistance in coordinating genetic counseling and/or testing for this family member.   Lastly, we encouraged Gail Erickson to remain in contact with cancer genetics annually so that we  can continuously update the family history and inform her of any changes in cancer genetics and testing that may be of benefit for this family.   Ms.  Erickson questions were answered to her satisfaction today. Our contact information was provided should additional questions or concerns arise. Thank you for the referral and allowing [place] to share in the care of your patient.   Burnie P. Florene Erickson, [place], [facility] Certified Genetic Counselor [email or web address] phone: [phone]  The patient was seen for a total of 60 minutes in face-to-face genetic counseling.  This patient was discussed with Drs. Magrinat, Lindi Adie and/or Burr Medico who agrees with the above.    _______________________________________________________________________ For Office Staff:  Number of people involved in session: 1 Was an Intern/ student involved with case: no

## 2014-09-16 ENCOUNTER — Ambulatory Visit: Payer: BLUE CROSS/BLUE SHIELD | Admitting: Obstetrics and Gynecology

## 2014-09-18 ENCOUNTER — Ambulatory Visit (INDEPENDENT_AMBULATORY_CARE_PROVIDER_SITE_OTHER): Payer: BLUE CROSS/BLUE SHIELD | Admitting: Obstetrics and Gynecology

## 2014-09-18 ENCOUNTER — Encounter: Payer: Self-pay | Admitting: Obstetrics and Gynecology

## 2014-09-18 VITALS — BP 100/70 | HR 80 | Ht 69.0 in | Wt 229.0 lb

## 2014-09-18 DIAGNOSIS — N926 Irregular menstruation, unspecified: Secondary | ICD-10-CM

## 2014-09-18 DIAGNOSIS — B372 Candidiasis of skin and nail: Secondary | ICD-10-CM | POA: Diagnosis not present

## 2014-09-18 DIAGNOSIS — R896 Abnormal cytological findings in specimens from other organs, systems and tissues: Secondary | ICD-10-CM | POA: Diagnosis not present

## 2014-09-18 DIAGNOSIS — IMO0002 Reserved for concepts with insufficient information to code with codable children: Secondary | ICD-10-CM

## 2014-09-18 DIAGNOSIS — Z803 Family history of malignant neoplasm of breast: Secondary | ICD-10-CM

## 2014-09-18 MED ORDER — ETONOGESTREL-ETHINYL ESTRADIOL 0.12-0.015 MG/24HR VA RING: 1.0000 | VAGINAL_RING | VAGINAL | Status: DC

## 2014-09-18 MED ORDER — NYSTATIN 100000 UNIT/GM EX POWD: Freq: Three times a day (TID) | CUTANEOUS | Status: DC

## 2014-09-18 NOTE — Patient Instructions (Signed)

## 2014-09-18 NOTE — Progress Notes (Signed)
GYNECOLOGY  VISIT   HPI: 39 y.o.   Single  African American  female   [name] with Patient's last menstrual period was 09/02/2014.   here to discuss options.    Irregular menses - June 2015, March 2016, April 2016, May 2016, and June 2016. History of irregular menses life long.  At most 3 cycles per year last year.  Seem to be improving this year.  Had PCOS labs and only DHEAS was modestly elevated.  Went to [facility] in [place] for genetic counseling due to mother with breast cancer in her 28s. Declined genetic testing.   Interested in weight loss. Stress eating.   Having external groin yeast infections.  Wants Rx.  Recent colposcopy with biopsy showing LGSIL.  GYNECOLOGIC HISTORY: Patient's last menstrual period was 09/02/2014. Contraception: Condoms Menopausal hormone therapy:  Last mammogram: 05/29/14 bi-rads 1: benign Last pap smear: 05/29/14 LGSIL had colpo bx done 07/13/14 LGSIL         OB History    Gravida Para Term Preterm AB TAB SAB Ectopic Multiple Living   0 0 0 0 0 0 0 0 0 0          Patient Active Problem List   Diagnosis Date Noted  . Family history of breast cancer     Past Medical History  Diagnosis Date  . Cyst of right breast     --stable in 2015 U/S done in [place], [place]  . Abnormal Pap smear of cervix 06-02-14    LGSIL:neg HR HPV (1st abn. pap)  . Family history of breast cancer     Past Surgical History  Procedure Laterality Date  . Umbilical hernia repair      --as a child    Current Outpatient Prescriptions  Medication Sig Dispense Refill  . ciclopirox (PENLAC) 8 % solution Apply 1 application topically daily.  0  . Mefenamic Acid 250 MG CAPS   2  . nystatin-triamcinolone (MYCOLOG II) cream Apply 1 application topically as needed.  0  . Vitamin D, Ergocalciferol, (DRISDOL) 50000 UNITS CAPS capsule Take 1 capsule by mouth once a week.  0   No current facility-administered medications for this visit.     ALLERGIES:  Review of patient's allergies indicates no known allergies.  Family History  Problem Relation Age of Onset  . Breast cancer Mother 39  . Diabetes Mother   . Hypertension Mother   . Hyperlipidemia Mother   . Thyroid disease Mother   . Breast cancer Paternal Aunt 6    bil. Breast ca--deceased CHF  . Breast cancer Cousin 21    maternal first cousin died Breast ca age 69  . Hyperlipidemia Father   . HIV/AIDS Maternal Uncle   . Cancer Paternal Uncle     NOS - needed BMT  . Cancer Maternal Grandmother 71    adrenal gland cancer  . Breast cancer Paternal Grandmother     dx in her mid to late 17s  . Aneurysm Maternal Aunt 26    Brain  . Kidney disease Maternal Uncle   . Cancer Other     2 maternal great uncles with cancer NOS    History   Social History  . Marital Status: Single    Spouse Name: N/A  . Number of Children: 0  . Years of Education: N/A   Occupational History  . Not on file.   Social History Main Topics  . Smoking status: Never Smoker   . Smokeless tobacco: Not on file  .  Alcohol Use: 0.0 oz/week    0 Standard drinks or equivalent per week     Comment: 1 drink per month  . Drug Use: No  . Sexual Activity:    Partners: Male     Comment: Condoms-sometimes   Other Topics Concern  . Not on file   Social History Narrative    ROS:  Pertinent items are noted in HPI.  PHYSICAL EXAMINATION:    Ht 5\' 9"  (1.753 m)  Wt 229 lb (103.874 kg)  BMI 33.80 kg/m2  LMP 09/02/2014    General appearance: alert, cooperative and appears stated age  ASSESSMENT  FH of breast cancer.  Declined genetic testing.  Skipped cycles last year.  Appear to be normalizing more now.  LGSIL. Weight gain.  Yeast infections.   PLAN  Counseled regarding LGSIL, PCOS and irregular cycles, FH of breast cancer.  Declines genetic testing.  Do yearly mammogram. Discussed options for cycle regulation - OCPs, Nuva Ring, Ortho Evra, progesterone IUDs, Depo Provera, periodic  progesterone.  Patient opts for [employer].  Instructed in use.  Discussed warning signs of DVT, PE, MI, and stroke.  See orders Will do follow up in 3 months for a recheck.  Cotesting - pap and HR HPV in one year.  Encouraged to do weight loss which ultimately can help to control irregular menses and PCOS. Nystatin powder.  See Rx. Discussed using natural fabrics and not staying in a wet bathing suit.   An After Visit Summary was printed and given to the patient.  __25____ minutes face to face time of which over 50% was spent in counseling.

## 2014-12-25 ENCOUNTER — Ambulatory Visit: Payer: BLUE CROSS/BLUE SHIELD | Admitting: Obstetrics and Gynecology

## 2015-01-13 ENCOUNTER — Encounter: Payer: Self-pay | Admitting: Obstetrics and Gynecology

## 2015-01-13 ENCOUNTER — Ambulatory Visit (INDEPENDENT_AMBULATORY_CARE_PROVIDER_SITE_OTHER): Payer: BLUE CROSS/BLUE SHIELD | Admitting: Obstetrics and Gynecology

## 2015-01-13 VITALS — BP 118/70 | HR 76 | Resp 16 | Ht 69.0 in | Wt 238.0 lb

## 2015-01-13 DIAGNOSIS — Z803 Family history of malignant neoplasm of breast: Secondary | ICD-10-CM

## 2015-01-13 DIAGNOSIS — R896 Abnormal cytological findings in specimens from other organs, systems and tissues: Secondary | ICD-10-CM

## 2015-01-13 DIAGNOSIS — IMO0002 Reserved for concepts with insufficient information to code with codable children: Secondary | ICD-10-CM

## 2015-01-13 DIAGNOSIS — N762 Acute vulvitis: Secondary | ICD-10-CM | POA: Diagnosis not present

## 2015-01-13 MED ORDER — NYSTATIN 100000 UNIT/GM EX POWD: Freq: Three times a day (TID) | CUTANEOUS | Status: DC

## 2015-01-13 MED ORDER — FLUCONAZOLE 150 MG PO TABS: 150.0000 mg | ORAL_TABLET | Freq: Once | ORAL | Status: DC

## 2015-01-13 NOTE — Progress Notes (Signed)
GYNECOLOGY  VISIT   HPI: 39 y.o.   Single  African American  female   [place] with Patient's last menstrual period was 01/06/2015.   here for Follow up Nuva Ring    Did not start the NuvaRing.  Has been having cycle monthly.  9/23, 10/26. Cycles lasting 4 - 5 days.  Not too heavy.  Not having much cramping.   Having some rash on her inner thighs. Worried about yeast.  No discharge today. Wants refill of Nystatin.  Going on a cruise to [place] this weekend.   Normal HgbA1C.  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  Did genetics counseling for breast cancer and did not do genetic testing.  She will ask her mother to do the testing but this has not been possible to date due to busy schedules.  GYNECOLOGIC HISTORY: Patient's last menstrual period was 01/06/2015. Contraception: Nuva Ring  Menopausal hormone therapy: None Last mammogram: 05/29/14 BIRADS2:Benign Last pap smear: 05/2014 LGSIL        OB History    Gravida Para Term Preterm AB TAB SAB Ectopic Multiple Living   0 0 0 0 0 0 0 0 0 0          Patient Active Problem List   Diagnosis Date Noted  . Family history of breast cancer     Past Medical History  Diagnosis Date  . Cyst of right breast     --stable in 2015 U/S done in [place], [place]  . Abnormal Pap smear of cervix 06-02-14    LGSIL:neg HR HPV (1st abn. pap)  . Family history of breast cancer     Past Surgical History  Procedure Laterality Date  . Umbilical hernia repair      --as a child    Current Outpatient Prescriptions  Medication Sig Dispense Refill  . ciclopirox (PENLAC) 8 % solution Apply 1 application topically daily.  0  . etonogestrel-ethinyl estradiol (NUVARING) 0.12-0.015 MG/24HR vaginal ring Place 1 each vaginally every 28 (twenty-eight) days. Insert vaginally and leave in place for 3 consecutive weeks, then remove for 1 week. 1 each 3  . Mefenamic Acid 250 MG CAPS   2  . nystatin (MYCOSTATIN) powder Apply topically 3 (three) times daily. Apply to affected  area for up to 7 days 15 g 2  . nystatin-triamcinolone (MYCOLOG II) cream Apply 1 application topically as needed.  0   No current facility-administered medications for this visit.     ALLERGIES: Review of patient's allergies indicates no known allergies.  Family History  Problem Relation Age of Onset  . Breast cancer Mother 45  . Diabetes Mother   . Hypertension Mother   . Hyperlipidemia Mother   . Thyroid disease Mother   . Breast cancer Paternal Aunt 38    bil. Breast ca--deceased CHF  . Breast cancer Cousin 77    maternal first cousin died Breast ca age 57  . Hyperlipidemia Father   . HIV/AIDS Maternal Uncle   . Cancer Paternal Uncle     NOS - needed BMT  . Cancer Maternal Grandmother 38    adrenal gland cancer  . Breast cancer Paternal Grandmother     dx in her mid to late 74s  . Aneurysm Maternal Aunt 26    Brain  . Kidney disease Maternal Uncle   . Cancer Other     2 maternal great uncles with cancer NOS    Social History   Social History  . Marital Status: Single    Spouse Name:  N/A  . Number of Children: 0  . Years of Education: N/A   Occupational History  . Not on file.   Social History Main Topics  . Smoking status: Never Smoker   . Smokeless tobacco: Not on file  . Alcohol Use: 0.0 oz/week    0 Standard drinks or equivalent per week     Comment: 1 drink per month  . Drug Use: No  . Sexual Activity:    Partners: Male     Comment: Condoms-sometimes   Other Topics Concern  . Not on file   Social History Narrative    ROS:  Pertinent items are noted in HPI.  PHYSICAL EXAMINATION:    BP 118/70 mmHg  Pulse 76  Resp 16  Ht 5\' 9"  (1.753 m)  Wt 238 lb (107.956 kg)  BMI 35.13 kg/m2  LMP 01/06/2015    General appearance: alert, cooperative and appears stated age  Pelvic: External genitalia:   Medial thighs with darker colored skin. No fissures or breakdown.               Urethra:  normal appearing urethra with no masses, tenderness or  lesions              Bartholins and Skenes: normal                 Vagina: normal appearing vagina with yellow slightly frothy discharge, no lesions.              Cervix: no lesions.  Affirm taken.              Bimanual Exam:  Uterus:  normal size, contour, position, consistency, mobility, non-tender              Adnexa: normal adnexa and no mass, fullness, tenderness                Chaperone was present for exam.  ASSESSMENT  Vulvovaginitis.  Cycles normal.  PCOS.  FH breast cancer.  LGSIL.  PLAN  Counseled regarding vulvovaginitis.  Affirn done.  Diflucan and refill on Nystatin powder.  OK to stay off NuvaRing.  Will call if cycles become irregular and needs to restart this.  Understands how it helps to reduce endometrial hyperplasia in PCOS if cycles are irregular.  She will need to call for this Rx. Discussed LGSIL colpo results. Annual exam and pap in May 2016.  Annual mammogram - 3D recommended.  Will start going to [place]. Information given.   An After Visit Summary was printed and given to the patient.  ___25___ minutes face to face time of which over 50% was spent in counseling.

## 2015-01-14 ENCOUNTER — Telehealth: Payer: Self-pay

## 2015-01-14 LAB — WET PREP BY MOLECULAR PROBE
Candida species: NEGATIVE
GARDNERELLA VAGINALIS: POSITIVE — AB
Trichomonas vaginosis: NEGATIVE

## 2015-01-14 MED ORDER — METRONIDAZOLE 500 MG PO TABS: 500.0000 mg | ORAL_TABLET | Freq: Two times a day (BID) | ORAL | Status: DC

## 2015-01-14 NOTE — Telephone Encounter (Signed)
-----   Message from [name], MD sent at 01/14/2015  7:12 AM EDT ----- Please inform patient of bacterial vaginosis seen on the Affirm.  I am recommending either Flagyl 500 mg po bid or Metrogel pv at hs for 5 nights.  She may choose. I am also treating her for external yeast, and she already has prescriptions for this.   Cc- Marisa Sprinkles

## 2015-01-14 NOTE — Telephone Encounter (Signed)
Spoke with patient. Advised patient of results as seen below. Patient is agreeable. Would like rx for Flagyl 500 mg bid x 7 days. ETOH precautions given. Rx for Flagyl 500 mg po bid x 7 days #14 0RF sent to pharmacy on file.  Routing to provider for final review. Patient agreeable to disposition. Will close encounter.

## 2015-05-21 ENCOUNTER — Encounter: Payer: Self-pay | Admitting: Obstetrics and Gynecology

## 2015-05-21 ENCOUNTER — Ambulatory Visit (INDEPENDENT_AMBULATORY_CARE_PROVIDER_SITE_OTHER): Payer: BLUE CROSS/BLUE SHIELD | Admitting: Obstetrics and Gynecology

## 2015-05-21 ENCOUNTER — Telehealth: Payer: Self-pay | Admitting: Obstetrics and Gynecology

## 2015-05-21 VITALS — BP 110/68 | HR 70 | Resp 22 | Ht 68.5 in | Wt 236.6 lb

## 2015-05-21 DIAGNOSIS — Z113 Encounter for screening for infections with a predominantly sexual mode of transmission: Secondary | ICD-10-CM | POA: Diagnosis not present

## 2015-05-21 DIAGNOSIS — R7989 Other specified abnormal findings of blood chemistry: Secondary | ICD-10-CM

## 2015-05-21 DIAGNOSIS — A630 Anogenital (venereal) warts: Secondary | ICD-10-CM | POA: Diagnosis not present

## 2015-05-21 DIAGNOSIS — Z23 Encounter for immunization: Secondary | ICD-10-CM | POA: Diagnosis not present

## 2015-05-21 DIAGNOSIS — Z0000 Encounter for general adult medical examination without abnormal findings: Secondary | ICD-10-CM | POA: Diagnosis not present

## 2015-05-21 DIAGNOSIS — E559 Vitamin D deficiency, unspecified: Secondary | ICD-10-CM

## 2015-05-21 DIAGNOSIS — E278 Other specified disorders of adrenal gland: Secondary | ICD-10-CM

## 2015-05-21 DIAGNOSIS — Z01419 Encounter for gynecological examination (general) (routine) without abnormal findings: Secondary | ICD-10-CM

## 2015-05-21 HISTORY — DX: Other specified abnormal findings of blood chemistry: R79.89

## 2015-05-21 HISTORY — DX: Other specified disorders of adrenal gland: E27.8

## 2015-05-21 HISTORY — DX: Vitamin D deficiency, unspecified: E55.9

## 2015-05-21 LAB — HEMOGLOBIN, FINGERSTICK: HEMOGLOBIN, FINGERSTICK: 12.1 g/dL (ref 12.0–16.0)

## 2015-05-21 LAB — COMPREHENSIVE METABOLIC PANEL
ALK PHOS: 49 U/L (ref 33–115)
ALT: 13 U/L (ref 6–29)
AST: 13 U/L (ref 10–30)
Albumin: 4 g/dL (ref 3.6–5.1)
BILIRUBIN TOTAL: 1.1 mg/dL (ref 0.2–1.2)
BUN: 7 mg/dL (ref 7–25)
CALCIUM: 9.5 mg/dL (ref 8.6–10.2)
CO2: 25 mmol/L (ref 20–31)
Chloride: 104 mmol/L (ref 98–110)
Creat: 0.77 mg/dL (ref 0.50–1.10)
GLUCOSE: 89 mg/dL (ref 65–99)
Potassium: 4.1 mmol/L (ref 3.5–5.3)
Sodium: 138 mmol/L (ref 135–146)
TOTAL PROTEIN: 7 g/dL (ref 6.1–8.1)

## 2015-05-21 LAB — POCT URINALYSIS DIPSTICK
BILIRUBIN UA: NEGATIVE
Glucose, UA: NEGATIVE
Ketones, UA: NEGATIVE
LEUKOCYTES UA: NEGATIVE
NITRITE UA: NEGATIVE
PH UA: 8
PROTEIN UA: NEGATIVE
RBC UA: NEGATIVE
UROBILINOGEN UA: NEGATIVE

## 2015-05-21 LAB — CBC
HEMATOCRIT: 36.3 % (ref 36.0–46.0)
Hemoglobin: 12 g/dL (ref 12.0–15.0)
MCH: 28.4 pg (ref 26.0–34.0)
MCHC: 33.1 g/dL (ref 30.0–36.0)
MCV: 86 fL (ref 78.0–100.0)
MPV: 9.3 fL (ref 8.6–12.4)
Platelets: 294 10*3/uL (ref 150–400)
RBC: 4.22 MIL/uL (ref 3.87–5.11)
RDW: 14.4 % (ref 11.5–15.5)
WBC: 5.3 10*3/uL (ref 4.0–10.5)

## 2015-05-21 LAB — TSH: TSH: 0.62 mIU/L

## 2015-05-21 LAB — LIPID PANEL
CHOLESTEROL: 186 mg/dL (ref 125–200)
HDL: 50 mg/dL (ref >=46)
LDL Cholesterol: 120 mg/dL (ref <130)
Total CHOL/HDL Ratio: 3.7 Ratio (ref <=5.0)
Triglycerides: 80 mg/dL (ref <150)
VLDL: 16 mg/dL (ref <30)

## 2015-05-21 NOTE — Patient Instructions (Signed)
EXERCISE AND DIET:  We recommended that you start or continue a regular exercise program for good health. Regular exercise means any activity that makes your heart beat faster and makes you sweat.  We recommend exercising at least 30 minutes per day at least 3 days a week, preferably 4 or 5.  We also recommend a diet low in fat and sugar.  Inactivity, poor dietary choices and obesity can cause diabetes, heart attack, stroke, and kidney damage, among others.    ALCOHOL AND SMOKING:  Women should limit their alcohol intake to no more than 7 drinks/beers/glasses of wine (combined, not each!) per week. Moderation of alcohol intake to this level decreases your risk of breast cancer and liver damage. And of course, no recreational drugs are part of a healthy lifestyle.  And absolutely no smoking or even second hand smoke. Most people know smoking can cause heart and lung diseases, but did you know it also contributes to weakening of your bones? Aging of your skin?  Yellowing of your teeth and nails?  CALCIUM AND VITAMIN D:  Adequate intake of calcium and Vitamin D are recommended.  The recommendations for exact amounts of these supplements seem to change often, but generally speaking 600 mg of calcium (either carbonate or citrate) and 800 units of Vitamin D per day seems prudent. Certain women may benefit from higher intake of Vitamin D.  If you are among these women, your doctor will have told you during your visit.    PAP SMEARS:  Pap smears, to check for cervical cancer or precancers,  have traditionally been done yearly, although recent scientific advances have shown that most women can have pap smears less often.  However, every woman still should have a physical exam from her gynecologist every year. It will include a breast check, inspection of the vulva and vagina to check for abnormal growths or skin changes, a visual exam of the cervix, and then an exam to evaluate the size and shape of the uterus and  ovaries.  And after 40 years of age, a rectal exam is indicated to check for rectal cancers. We will also provide age appropriate advice regarding health maintenance, like when you should have certain vaccines, screening for sexually transmitted diseases, bone density testing, colonoscopy, mammograms, etc.   MAMMOGRAMS:  All women over 40 years old should have a yearly mammogram. Many facilities now offer a "3D" mammogram, which may cost around $50 extra out of pocket. If possible,  we recommend you accept the option to have the 3D mammogram performed.  It both reduces the number of women who will be called back for extra views which then turn out to be normal, and it is better than the routine mammogram at detecting truly abnormal areas.    COLONOSCOPY:  Colonoscopy to screen for colon cancer is recommended for all women at age 50.  We know, you hate the idea of the prep.  We agree, BUT, having colon cancer and not knowing it is worse!!  Colon cancer so often starts as a polyp that can be seen and removed at colonscopy, which can quite literally save your life!  And if your first colonoscopy is normal and you have no family history of colon cancer, most women don't have to have it again for 10 years.  Once every ten years, you can do something that may end up saving your life, right?  We will be happy to help you get it scheduled when you are ready.    Be sure to check your insurance coverage so you understand how much it will cost.  It may be covered as a preventative service at no cost, but you should check your particular policy.     Genital Warts Genital warts are a common STD (sexually transmitted disease). They may appear as small bumps on the tissues of the genital area or anal area. Sometimes, they can become irritated and cause pain. Genital warts are easily passed to other people through sexual contact. Getting treatment is important because genital warts can lead to other problems. In females, the  virus that causes genital warts may increase the risk of cervical cancer. CAUSES Genital warts are caused by a virus that is called human papillomavirus (HPV). HPV is spread by having unprotected sex with an infected person. It can be spread through vaginal, anal, and oral sex. Many people do not know that they are infected. They may be infected for years without problems. However, even if they do not have problems, they can pass the infection to their sexual partners. RISK FACTORS Genital warts are more likely to develop in:  People who have unprotected sex.  People who have multiple sexual partners.  People who become sexually active before they are 41 years of age.  Men who are not circumcised.  Women who have a female sexual partner who is not circumcised.  People who have a weakened body defense system (immune system) due to disease or medicine.  People who smoke. SYMPTOMS Symptoms of genital warts include:  Small growths in the genital area or anal area. These warts often grow in clusters.  Itching and irritation in the genital area or anal area.  Bleeding from the warts.  Painful sexual intercourse. DIAGNOSIS Genital warts can usually be diagnosed from their appearance on the vagina, vulva, penis, perineum, anus, or rectum. Tests may also be done, such as:  Biopsy. A tissue sample is removed so it can be looked at under a microscope.  Colposcopy. In females, a magnifying tool is used to examine the vagina and cervix. Certain solutions may be used to make the HPV cells change color so they can be seen more easily.  A Pap test in females.  Tests for other STDs. TREATMENT Treatment for genital warts may include:  Applying prescription medicines to the warts. These may be solutions or creams.  Freezing the warts with liquid nitrogen (cryotherapy).  Burning the warts with:  Laser treatment.  An electrified probe (electrocautery).  Injecting a substance (Candida  antigen or Trichophyton antigen) into the warts to help the body's immune system to fight off the warts.  Interferon injections.  Surgery to remove the warts. HOME CARE INSTRUCTIONS Medicines  Apply over-the-counter and prescription medicines only as told by your health care provider.  Do not treat genital warts with medicines that are used for treating hand warts.  Talk with your health care provider about using over-the-counter anti-itch creams. General Instructions  Do not touch or scratch the warts.  Do not have sex until your treatment has been completed.  Tell your current and past sexual partners about your condition because they may also need treatment.  Keep all follow-up visits as told by your health care provider. This is important.  After treatment, use condoms during sex to prevent future infections. Other Instructions for Women  Women who have genital warts might need increased screening for cervical cancer. This type of cancer is slow growing and can be cured if it is found early.  Chances of developing cervical cancer are increased with HPV.  If you become pregnant, tell your health care provider that you have had HPV. Your health care provider will monitor you closely during pregnancy to be sure that your baby is safe. PREVENTION Talk with your health care provider about getting the HPV vaccines. These vaccines prevent some HPV infections and cancers. It is recommended that the vaccine be given to males and females who are 57-63 years of age. It will not work if you already have HPV, and it is not recommended for pregnant women. SEEK MEDICAL CARE IF:  You have redness, swelling, or pain in the area of the treated skin.  You have a fever.  You feel generally ill.  You feel lumps in and around your genital area or anal area.  You have bleeding in your genital area or anal area.  You have pain during sexual intercourse.   This information is not intended to  replace advice given to you by your health care provider. Make sure you discuss any questions you have with your health care provider.   Document Released: 02/25/2000 Document Revised: 11/18/2014 Document Reviewed: 05/25/2014 Elsevier Interactive Patient Education 2016 [employer].  HPV Test The human papillomavirus (HPV) test is used to look for high-risk types of HPV infection. HPV is a group of about 100 viruses. Many of these viruses cause growths on, in, or around the genitals. Most HPV viruses cause infections that usually go away without treatment. However, HPV types 6, 11, 16, and 18 are considered high-risk types of HPV that can increase your risk of cancer of the cervix or anus if the infection is left untreated. An HPV test identifies the DNA (genetic) strands of the HPV infection, so it is also referred to as the HPV DNA test. Although HPV is found in both males and females, the HPV test is only used to screen for increased cancer risk in females:  With an abnormal Pap test.  After treatment of an abnormal Pap test.  Between the ages of 88 and 25.  After treatment of a high-risk HPV infection. The HPV test may be done at the same time as a pelvic exam and Pap test in females over the age of 58. Both the HPV test and Pap test require a sample of cells from the cervix. PREPARATION FOR TEST  Do not douche or take a bath for 24-48 hours before the test or as directed by your health care provider.  Do not have sex for 24-48 hours before the test or as directed by your health care provider.  You may be asked to reschedule the test if you are menstruating.  You will be asked to urinate before the test. RESULTS It is your responsibility to obtain your test results. Ask the lab or department performing the test when and how you will get your results. Talk with your health care provider if you have any questions about your results. Your result will be negative or positive. Meaning of  Negative Test Results A negative HPV test result means that no HPV was found, and it is very likely that you do not have HPV. Meaning of Positive Test Results A positive HPV test result indicates that you have HPV.  If your test result shows the presence of any high-risk HPV strains, you may have an increased risk of developing cancer of the cervix or anus if the infection is left untreated.  If any low-risk HPV strains are found,  you are not likely to have an increased risk of cancer. Discuss your test results with your health care provider. He or she will use the results to make a diagnosis and determine a treatment plan that is right for you.   This information is not intended to replace advice given to you by your health care provider. Make sure you discuss any questions you have with your health care provider.   Document Released: 03/24/2004 Document Revised: 03/20/2014 Document Reviewed: 07/15/2013 Elsevier Interactive Patient Education [employer].

## 2015-05-21 NOTE — Progress Notes (Signed)
Patient ID: Gail Erickson, female   DOB: 1975-10-09, 40 y.o.   MRN: [id number] 40 y.o. [id number] Single African American female here for annual exam.    Menses have been every month for the last year since she was here. Menses in February was at the 6 week mark instead of 4 weeks. Did not start NuvaRing due to her cycles regulating.  Elevated DHEAS last year.  Desires STD testing, general labs, and vit D level checked.   FH of breast cancer.  Patient had genetic counseling but did not do testing.   Purchased her own home.  Some stress with family issues - mother and boyfriend.  Mother was living with them for a period of time.  PCP: Dwaine Gale, PA-C  Patient's last menstrual period was 04/28/2015 (exact date).          Sexually active: Yes.   female The current method of family planning is condoms sometimes.   Vasectomy.  Exercising: No.   Smoker:  no  Health Maintenance: Pap:  06-02-14 LGSIL:Neg HR HPV History of abnormal Pap:  Yes, 06-02-14 LGSIL:Neg HR HPV;colpos 07-13-14 showed LGSIL   MMG:  05-29-14 dense/Neg/BiRads2:[facility] Colonoscopy:  n/a BMD:   n/a  Result  n/a TDaP:  Unsure Screening Labs:  Hb today: 12.1, Urine today: Neg   reports that she has never smoked. She does not have any smokeless tobacco history on file. She reports that she drinks alcohol. She reports that she does not use illicit drugs.  Past Medical History  Diagnosis Date  . Cyst of right breast     --stable in 2015 U/S done in [place], [place]  . Abnormal Pap smear of cervix 06-02-14    LGSIL:neg HR HPV (1st abn. pap)  . Family history of breast cancer     Past Surgical History  Procedure Laterality Date  . Umbilical hernia repair      --as a child    Current Outpatient Prescriptions  Medication Sig Dispense Refill  . ciclopirox (PENLAC) 8 % solution Apply 1 application topically daily.  0  . Mefenamic Acid 250 MG CAPS   2  . nystatin (MYCOSTATIN) powder Apply topically 3 (three) times  daily. Apply to affected area for up to 7 days 15 g 2  . nystatin-triamcinolone (MYCOLOG II) cream Apply 1 application topically as needed.  0   No current facility-administered medications for this visit.    Family History  Problem Relation Age of Onset  . Breast cancer Mother 61  . Diabetes Mother   . Hypertension Mother   . Hyperlipidemia Mother   . Thyroid disease Mother   . Transient ischemic attack Mother   . Breast cancer Paternal Aunt 34    bil. Breast ca--deceased CHF  . Breast cancer Cousin 90    maternal first cousin died Breast ca age 53  . Hyperlipidemia Father   . HIV/AIDS Maternal Uncle   . Cancer Paternal Uncle     NOS - needed BMT  . Cancer Maternal Grandmother 51    adrenal gland cancer  . Breast cancer Paternal Grandmother     dx in her mid to late 65s  . Aneurysm Maternal Aunt 26    Brain  . Kidney disease Maternal Uncle   . Cancer Other     2 maternal great uncles with cancer NOS    ROS:  Pertinent items are noted in HPI.  Otherwise, a comprehensive ROS was negative.  Exam:   BP 110/68 mmHg  Pulse  70  Resp 22  Ht 5' 8.5" (1.74 m)  Wt 236 lb 9.6 oz (107.321 kg)  BMI 35.45 kg/m2  LMP 04/28/2015 (Exact Date)    General appearance: alert, cooperative and appears stated age Head: Normocephalic, without obvious abnormality, atraumatic Neck: no adenopathy, supple, symmetrical, trachea midline and thyroid normal to inspection and palpation Lungs: clear to auscultation bilaterally Breasts: normal appearance, no masses or tenderness, Inspection negative, No nipple retraction or dimpling, No nipple discharge or bleeding, No axillary or supraclavicular adenopathy Heart: regular rate and rhythm Abdomen: soft, non-tender; bowel sounds normal; no masses,  no organomegaly Extremities: extremities normal, atraumatic, no cyanosis or edema Skin: Skin color, texture, turgor normal. No rashes or lesions Lymph nodes: Cervical, supraclavicular, and axillary nodes  normal. No abnormal inguinal nodes palpated Neurologic: Grossly normal  Pelvic: External genitalia:  Right labia with condyloma measuring 1 cm in total.  Also 2 mm area at anal opening at 12:00 - condyloma.    (Patient states that I did a vulvar biopsy last year, and that now she has this lump there. There is no documentation of this in colposcopy note or in the specimen section of the lab results.)              Urethra:  normal appearing urethra with no masses, tenderness or lesions              Bartholins and Skenes: normal                 Vagina: normal appearing vagina with normal color and discharge, no lesions              Cervix: no lesions              Pap taken: Yes.   Bimanual Exam:  Uterus:  normal size, contour, position, consistency, mobility, non-tender              Adnexa: normal adnexa and no mass, fullness, tenderness              Rectovaginal: Yes.  .  Confirms.              Anus:  normal sphincter tone, no lesions  Chaperone was present for exam.  Assessment:   Well woman visit. LGSIL.  Vulvar condyloma. Hx elevated DHEAS.  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 FH of breast cancer.  Declines genetic testing.    Plan: Yearly mammogram recommended after age 2.   [facility] Recommended self breast exam.  Pap and HR HPV as above. I did discuss condyloma, HPV, and treatment options for the condyloma - TCA, cryotherapy.  Will await pap prior to initiating treatment. Discussed Calcium, Vitamin D, regular exercise program including cardiovascular and weight bearing exercise. Labs performed.  Yes.  .   See orders.   STD testing, DHEAS. Refills given on medications.  No..    TDap today.  Follow up annually and prn.   Patient received Tdap vaccine today in Right Deltoid. Pt was made aware of all side effects and what to expect after injection. She was given a VIS sheet to read over prior to injecting. All of the patient's questions were answered  and she tolerated the injection well.   After visit summary provided.

## 2015-05-22 ENCOUNTER — Other Ambulatory Visit: Payer: Self-pay | Admitting: Obstetrics and Gynecology

## 2015-05-22 ENCOUNTER — Encounter: Payer: Self-pay | Admitting: Obstetrics and Gynecology

## 2015-05-22 DIAGNOSIS — E559 Vitamin D deficiency, unspecified: Secondary | ICD-10-CM

## 2015-05-22 LAB — STD PANEL
HIV 1&2 Ab, 4th Generation: NONREACTIVE
Hepatitis B Surface Ag: NEGATIVE

## 2015-05-22 LAB — GC/CHLAMYDIA PROBE AMP
CT Probe RNA: NOT DETECTED
GC Probe RNA: NOT DETECTED

## 2015-05-22 LAB — HEPATITIS C ANTIBODY: HCV Ab: NEGATIVE

## 2015-05-22 LAB — VITAMIN D 25 HYDROXY (VIT D DEFICIENCY, FRACTURES): Vit D, 25-Hydroxy: 10 ng/mL — ABNORMAL LOW (ref 30–100)

## 2015-05-22 MED ORDER — VITAMIN D (ERGOCALCIFEROL) 1.25 MG (50000 UNIT) PO CAPS
50000.0000 [IU] | ORAL_CAPSULE | ORAL | Status: DC
Start: 2015-05-22 — End: 2015-09-11

## 2015-05-24 ENCOUNTER — Encounter: Payer: Self-pay | Admitting: Obstetrics and Gynecology

## 2015-05-24 LAB — DHEA-SULFATE: DHEA-SO4: 343 ug/dL — ABNORMAL HIGH (ref 23–266)

## 2015-05-26 LAB — IPS PAP TEST WITH HPV

## 2015-07-14 ENCOUNTER — Ambulatory Visit: Payer: BLUE CROSS/BLUE SHIELD | Admitting: Obstetrics and Gynecology

## 2015-09-11 ENCOUNTER — Telehealth: Payer: Self-pay | Admitting: Obstetrics and Gynecology

## 2015-09-13 NOTE — Telephone Encounter (Signed)
Needs to come in for vit D check in 4 weeks.

## 2015-09-13 NOTE — Telephone Encounter (Signed)
Medication refill request: Vitamin D 50000units Last AEX:  05/21/15 BS Next AEX: 06/01/16 Last MMG (if hormonal medication request): 05-29-14 dense/Neg/BiRads2:[facility] Refill authorized: 05/22/15 #12 w/0 refills; today patient should return for Vitamin D recheck?

## 2015-09-13 NOTE — Telephone Encounter (Signed)
Tried calling patient to schedule lab appointment for Vitamin D recheck. No answer, left message to return call

## 2015-09-15 NOTE — Telephone Encounter (Signed)
Patient is returning a call to [facility]. °

## 2015-09-15 NOTE — Telephone Encounter (Signed)
Spoke with patient, scheduled fro 10/18/15 for Vitamin D recheck. Patient asked for that day in specific

## 2015-10-18 ENCOUNTER — Other Ambulatory Visit (INDEPENDENT_AMBULATORY_CARE_PROVIDER_SITE_OTHER): Payer: BLUE CROSS/BLUE SHIELD

## 2015-10-18 DIAGNOSIS — E559 Vitamin D deficiency, unspecified: Secondary | ICD-10-CM

## 2015-10-19 LAB — VITAMIN D 25 HYDROXY (VIT D DEFICIENCY, FRACTURES): Vit D, 25-Hydroxy: 20 ng/mL — ABNORMAL LOW (ref 30–100)

## 2015-10-19 MED ORDER — VITAMIN D (ERGOCALCIFEROL) 1.25 MG (50000 UNIT) PO CAPS: 50000.0000 [IU] | ORAL_CAPSULE | ORAL | 0 refills | Status: DC

## 2016-01-21 ENCOUNTER — Other Ambulatory Visit (INDEPENDENT_AMBULATORY_CARE_PROVIDER_SITE_OTHER): Payer: BLUE CROSS/BLUE SHIELD

## 2016-01-21 DIAGNOSIS — E559 Vitamin D deficiency, unspecified: Secondary | ICD-10-CM

## 2016-01-22 LAB — VITAMIN D 25 HYDROXY (VIT D DEFICIENCY, FRACTURES): Vit D, 25-Hydroxy: 40 ng/mL (ref 30–100)

## 2016-01-24 ENCOUNTER — Telehealth: Payer: Self-pay

## 2016-01-24 NOTE — Telephone Encounter (Signed)
Called patient to discuss vitamin D results, left message to call me back.

## 2016-01-24 NOTE — Telephone Encounter (Signed)
-----   Message from [name], MD sent at 01/22/2016  6:18 AM EST ----- Results to patient through My Chart.  Hello Gail Erickson,   Your vitamin D level is now in the normal range.  I am recommending that you continue with over the counter vitamin D 4000 IU daily.  We will recheck this at your next routine annual exam.   Please contact the office for questions.   Thank you,   Gail Half, MD

## 2016-01-27 NOTE — Telephone Encounter (Signed)
Spoke with patient and notified of lab results.

## 2016-02-29 ENCOUNTER — Other Ambulatory Visit: Payer: Self-pay | Admitting: Obstetrics and Gynecology

## 2016-02-29 DIAGNOSIS — Z1231 Encounter for screening mammogram for malignant neoplasm of breast: Secondary | ICD-10-CM

## 2016-03-17 ENCOUNTER — Ambulatory Visit
Admission: RE | Admit: 2016-03-17 | Discharge: 2016-03-17 | Disposition: A | Discharge status: Home / Self Care | Payer: BLUE CROSS/BLUE SHIELD | Source: Ambulatory Visit | Attending: Obstetrics and Gynecology | Admitting: Obstetrics and Gynecology

## 2016-03-17 DIAGNOSIS — Z1231 Encounter for screening mammogram for malignant neoplasm of breast: Secondary | ICD-10-CM

## 2016-03-29 ENCOUNTER — Ambulatory Visit: Payer: BLUE CROSS/BLUE SHIELD

## 2016-06-01 ENCOUNTER — Ambulatory Visit (INDEPENDENT_AMBULATORY_CARE_PROVIDER_SITE_OTHER): Payer: BLUE CROSS/BLUE SHIELD | Admitting: Obstetrics and Gynecology

## 2016-06-01 ENCOUNTER — Encounter: Payer: Self-pay | Admitting: Obstetrics and Gynecology

## 2016-06-01 VITALS — BP 122/80 | Ht 68.0 in | Wt 242.0 lb

## 2016-06-01 DIAGNOSIS — Z113 Encounter for screening for infections with a predominantly sexual mode of transmission: Secondary | ICD-10-CM

## 2016-06-01 DIAGNOSIS — E278 Other specified disorders of adrenal gland: Secondary | ICD-10-CM

## 2016-06-01 DIAGNOSIS — R7989 Other specified abnormal findings of blood chemistry: Secondary | ICD-10-CM

## 2016-06-01 DIAGNOSIS — Z01419 Encounter for gynecological examination (general) (routine) without abnormal findings: Secondary | ICD-10-CM | POA: Diagnosis not present

## 2016-06-01 LAB — COMPREHENSIVE METABOLIC PANEL
ALBUMIN: 4.3 g/dL (ref 3.6–5.1)
ALT: 16 U/L (ref 6–29)
AST: 20 U/L (ref 10–30)
Alkaline Phosphatase: 54 U/L (ref 33–115)
BUN: 7 mg/dL (ref 7–25)
CALCIUM: 9.3 mg/dL (ref 8.6–10.2)
CHLORIDE: 104 mmol/L (ref 98–110)
CO2: 24 mmol/L (ref 20–31)
CREATININE: 0.8 mg/dL (ref 0.50–1.10)
Glucose, Bld: 88 mg/dL (ref 65–99)
POTASSIUM: 3.9 mmol/L (ref 3.5–5.3)
Sodium: 137 mmol/L (ref 135–146)
TOTAL PROTEIN: 7.2 g/dL (ref 6.1–8.1)
Total Bilirubin: 1.1 mg/dL (ref 0.2–1.2)

## 2016-06-01 LAB — TSH: TSH: 0.59 mIU/L

## 2016-06-01 LAB — LIPID PANEL
CHOL/HDL RATIO: 3.9 ratio (ref <5.0)
CHOLESTEROL: 189 mg/dL (ref <200)
HDL: 48 mg/dL — AB (ref >50)
LDL Cholesterol: 121 mg/dL — ABNORMAL HIGH (ref <100)
TRIGLYCERIDES: 98 mg/dL (ref <150)
VLDL: 20 mg/dL (ref <30)

## 2016-06-01 LAB — CBC
HEMATOCRIT: 37 % (ref 35.0–45.0)
HEMOGLOBIN: 12.4 g/dL (ref 11.7–15.5)
MCH: 29.2 pg (ref 27.0–33.0)
MCHC: 33.5 g/dL (ref 32.0–36.0)
MCV: 87.3 fL (ref 80.0–100.0)
MPV: 9 fL (ref 7.5–12.5)
Platelets: 318 10*3/uL (ref 140–400)
RBC: 4.24 MIL/uL (ref 3.80–5.10)
RDW: 14.9 % (ref 11.0–15.0)
WBC: 5.2 10*3/uL (ref 3.8–10.8)

## 2016-06-01 MED ORDER — NYSTATIN 100000 UNIT/GM EX POWD: Freq: Three times a day (TID) | CUTANEOUS | 2 refills | Status: DC

## 2016-06-01 NOTE — Patient Instructions (Addendum)
EXERCISE AND DIET:  We recommended that you start or continue a regular exercise program for good health. Regular exercise means any activity that makes your heart beat faster and makes you sweat.  We recommend exercising at least 30 minutes per day at least 3 days a week, preferably 4 or 5.  We also recommend a diet low in fat and sugar.  Inactivity, poor dietary choices and obesity can cause diabetes, heart attack, stroke, and kidney damage, among others.    ALCOHOL AND SMOKING:  Women should limit their alcohol intake to no more than 7 drinks/beers/glasses of wine (combined, not each!) per week. Moderation of alcohol intake to this level decreases your risk of breast cancer and liver damage. And of course, no recreational drugs are part of a healthy lifestyle.  And absolutely no smoking or even second hand smoke. Most people know smoking can cause heart and lung diseases, but did you know it also contributes to weakening of your bones? Aging of your skin?  Yellowing of your teeth and nails?  CALCIUM AND VITAMIN D:  Adequate intake of calcium and Vitamin D are recommended.  The recommendations for exact amounts of these supplements seem to change often, but generally speaking 600 mg of calcium (either carbonate or citrate) and 800 units of Vitamin D per day seems prudent. Certain women may benefit from higher intake of Vitamin D.  If you are among these women, your doctor will have told you during your visit.    PAP SMEARS:  Pap smears, to check for cervical cancer or precancers,  have traditionally been done yearly, although recent scientific advances have shown that most women can have pap smears less often.  However, every woman still should have a physical exam from her gynecologist every year. It will include a breast check, inspection of the vulva and vagina to check for abnormal growths or skin changes, a visual exam of the cervix, and then an exam to evaluate the size and shape of the uterus and  ovaries.  And after 42 years of age, a rectal exam is indicated to check for rectal cancers. We will also provide age appropriate advice regarding health maintenance, like when you should have certain vaccines, screening for sexually transmitted diseases, bone density testing, colonoscopy, mammograms, etc.   MAMMOGRAMS:  All women over 40 years old should have a yearly mammogram. Many facilities now offer a "3D" mammogram, which may cost around $50 extra out of pocket. If possible,  we recommend you accept the option to have the 3D mammogram performed.  It both reduces the number of women who will be called back for extra views which then turn out to be normal, and it is better than the routine mammogram at detecting truly abnormal areas.    COLONOSCOPY:  Colonoscopy to screen for colon cancer is recommended for all women at age 50.  We know, you hate the idea of the prep.  We agree, BUT, having colon cancer and not knowing it is worse!!  Colon cancer so often starts as a polyp that can be seen and removed at colonscopy, which can quite literally save your life!  And if your first colonoscopy is normal and you have no family history of colon cancer, most women don't have to have it again for 10 years.  Once every ten years, you can do something that may end up saving your life, right?  We will be happy to help you get it scheduled when you are ready.    Be sure to check your insurance coverage so you understand how much it will cost.  It may be covered as a preventative service at no cost, but you should check your particular policy.     Fungal Nail Infection Fungal nail infection is a common fungal infection of the toenails or fingernails. This condition affects toenails more often than fingernails. More than one nail may be infected. The condition can be passed from person to person (is contagious). What are the causes? This condition is caused by a fungus. Several types of funguses can cause the  infection. These funguses are common in moist and warm areas. If your hands or feet come into contact with the fungus, it may get into a crack in your fingernail or toenail and cause the infection. What increases the risk? The following factors may make you more likely to develop this condition:  Being female.  Having diabetes.  Being of older age.  Living with someone who has the fungus.  Walking barefoot in areas where the fungus thrives, such as showers or locker rooms.  Having poor circulation.  Wearing shoes and socks that cause your feet to sweat.  Having athlete's foot.  Having a nail injury or history of a recent nail surgery.  Having psoriasis.  Having a weak body defense system (immune system). What are the signs or symptoms? Symptoms of this condition include:  A pale spot on the nail.  Thickening of the nail.  A nail that becomes yellow or brown.  A brittle or ragged nail edge.  A crumbling nail.  A nail that has lifted away from the nail bed. How is this diagnosed? This condition is diagnosed with a physical exam. Your health care provider may take a scraping or clipping from your nail to test for the fungus. How is this treated? Mild infections do not need treatment. If you have significant nail changes, treatment may include:  Oral antifungal medicines. You may need to take the medicine for several weeks or several months, and you may not see the results for a long time. These medicines can cause side effects. Ask your health care provider what problems to watch for.  Antifungal nail polish and nail cream. These may be used along with oral antifungal medicines.  Laser treatment of the nail.  Surgery to remove the nail. This may be needed for the most severe infections. Treatment takes a long time, and the infection may come back. Follow these instructions at home: Medicines   Take or apply over-the-counter and prescription medicines only as told by  your health care provider.  Ask your health care provider about using over-the-counter mentholated ointment on your nails. Lifestyle    Do not share personal items, such as towels or nail clippers.  Trim your nails often.  Wash and dry your hands and feet every day.  Wear absorbent socks, and change your socks frequently.  Wear shoes that allow air to circulate, such as sandals or canvas tennis shoes. Throw out old shoes.  Wear rubber gloves if you are working with your hands in wet areas.  Do not walk barefoot in shower rooms or locker rooms.  Do not use a nail salon that does not use clean instruments.  Do not use artificial nails. General instructions   Keep all follow-up visits as told by your health care provider. This is important.  Use antifungal foot powder on your feet and in your shoes. Contact a health care provider if: Your infection  is not getting better or it is getting worse after several months. This information is not intended to replace advice given to you by your health care provider. Make sure you discuss any questions you have with your health care provider. Document Released: 02/25/2000 Document Revised: 08/05/2015 Document Reviewed: 08/31/2014 Elsevier Interactive Patient Education  2017 [employer].

## 2016-06-01 NOTE — Progress Notes (Signed)
41 y.o. G0P0000 Single African American female here for annual exam.    Unable to donate blood due to low iron.  Hx elevated DHEAS and low vit D.  Has an infected toe nail. Asking for the reason why.  Second cousin had a pulmonary embolism from taking oral contraceptives.  Ended a relationship.  Stress at work.  PCP: None  Patient's last menstrual period was 05/11/2016 (exact date).     Period Cycle (Days): 30 Period Duration (Days): 4 Period Pattern: Regular Menstrual Flow:  (first day heavy then tapers) Menstrual Control: Maxi pad Menstrual Control Change Freq (Hours): every 4-5 hours on heaviest day Dysmenorrhea: (!) Mild Dysmenorrhea Symptoms: Cramping (breast tenderness prior to cycles)     Sexually active: No.  The current method of family planning is abstinence/condoms.    Exercising: No.   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 She has never used smokeless tobacco. She reports that she drinks alcohol. She reports that she does not use drugs.  Past Medical History:  Diagnosis Date  . Abnormal Pap smear of cervix 06-02-14   LGSIL:neg HR HPV (1st abn. pap)  . Cyst of right breast    --stable in 2015 U/S done in [place], [place]  . Elevated DHEA ([place]) 05/21/15   level = 343  . Family history of breast cancer   . Vitamin D deficiency 05/21/15   level = 10    Past Surgical History:  Procedure Laterality Date  . UMBILICAL HERNIA REPAIR     --as a child    Current Outpatient Prescriptions  Medication Sig Dispense Refill  . ciclopirox (PENLAC) 8 % solution Apply 1 application topically daily.  0  . Mefenamic Acid 250 MG CAPS   2   . nystatin (MYCOSTATIN) powder Apply topically 3 (three) times daily. Apply to affected area for up to 7 days 15 g 2  . nystatin-triamcinolone (MYCOLOG II) cream Apply 1 application topically as needed.  0   No current facility-administered medications for this visit.     Family History  Problem Relation Age of Onset  . Breast cancer Mother 48  . Diabetes Mother   . Hypertension Mother   . Hyperlipidemia Mother   . Thyroid disease Mother   . Transient ischemic attack Mother   . Breast cancer Paternal Aunt 35    bil. Breast ca--deceased CHF  . Breast cancer Cousin 47    maternal first cousin died Breast ca age 66  . Hyperlipidemia Father   . HIV/AIDS Maternal Uncle   . Cancer Paternal Uncle     NOS - needed BMT  . Cancer Maternal Grandmother 6    adrenal gland cancer  . Breast cancer Paternal Grandmother     dx in her mid to late 88s  . Aneurysm Maternal Aunt 26    Brain  . Kidney disease Maternal Uncle   . Cancer Other     2 maternal great uncles with cancer NOS    ROS:  Pertinent items are noted in HPI.  Otherwise, a comprehensive ROS was negative.  Exam:   BP 122/80 (BP Location: Right  Arm, Patient Position: Sitting, Cuff Size: Large)   Ht 5\' 8"  (1.727 m)   Wt 242 lb (109.8 kg)   LMP 05/11/2016 (Exact Date)   BMI 36.80 kg/m     General appearance: alert, cooperative and appears stated age Head: Normocephalic, without obvious abnormality, atraumatic Neck: no adenopathy, supple, symmetrical, trachea midline and thyroid normal to inspection and palpation Lungs: clear to auscultation bilaterally Breasts: normal appearance, no masses or tenderness, No nipple retraction or dimpling, No nipple discharge or bleeding, No axillary or supraclavicular adenopathy Heart: regular rate and rhythm Abdomen: soft, non-tender; no masses, no organomegaly Extremities: extremities normal, atraumatic, no cyanosis or edema Skin: Skin color, texture, turgor normal. No rashes or  lesions Lymph nodes: Cervical, supraclavicular, and axillary nodes normal. No abnormal inguinal nodes palpated Neurologic: Grossly normal  Pelvic: External genitalia:  no lesions              Urethra:  normal appearing urethra with no masses, tenderness or lesions              Bartholins and Skenes: normal                 Vagina: normal appearing vagina with normal color and discharge, no lesions              Cervix: no lesions              Pap taken: Yes.   Bimanual Exam:  Uterus:  normal size, contour, position, consistency, mobility, non-tender              Adnexa: no mass, fullness, tenderness              Rectal exam: Yes.  .  Confirms.              Anus:  normal sphincter tone, no lesions  Chaperone was present for exam.  Assessment:   Well woman visit with normal exam. LGSIL.  Vulvar condyloma. Hx elevated DHEAS.  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 FH of breast cancer.  Declines genetic testing.  Toe nail infection.   Plan: Mammogram screening discussed.  Does 3D. Recommended self breast awareness. Pap and HR HPV as above. Guidelines for Calcium, Vitamin D, regular exercise program including cardiovascular and weight bearing exercise. Routine labs and STD testing.  We talked again about genetic counseling and potential testing.  We focused on the education side of the issue and not the testing as much.  She was appreciative of this. Information on toe nail infection.  Refill on Nystatin powder. List of PCPs to patient. Follow up annually and prn.      After visit summary provided.

## 2016-06-02 LAB — STD PANEL
HEP B S AG: NEGATIVE
HIV: NONREACTIVE

## 2016-06-02 LAB — DHEA-SULFATE: DHEA SO4: 416 ug/dL — AB (ref 19–231)

## 2016-06-02 LAB — HEPATITIS C ANTIBODY: HCV AB: NEGATIVE

## 2016-06-02 LAB — VITAMIN D 25 HYDROXY (VIT D DEFICIENCY, FRACTURES): VIT D 25 HYDROXY: 23 ng/mL — AB (ref 30–100)

## 2016-06-05 ENCOUNTER — Other Ambulatory Visit: Payer: Self-pay | Admitting: *Deleted

## 2016-06-05 DIAGNOSIS — E278 Other specified disorders of adrenal gland: Secondary | ICD-10-CM

## 2016-06-05 DIAGNOSIS — R7989 Other specified abnormal findings of blood chemistry: Secondary | ICD-10-CM

## 2016-06-05 MED ORDER — VITAMIN D (ERGOCALCIFEROL) 1.25 MG (50000 UNIT) PO CAPS: 50000.0000 [IU] | ORAL_CAPSULE | ORAL | 0 refills | Status: DC

## 2016-06-05 NOTE — Addendum Note (Signed)
Addended by: Yisroel Ramming, Dietrich Pates E on: 06/05/2016 09:31 AM   Modules accepted: Orders

## 2016-06-06 LAB — IPS PAP TEST WITH HPV

## 2016-06-06 LAB — IPS N GONORRHOEA AND CHLAMYDIA BY PCR

## 2016-06-21 ENCOUNTER — Telehealth: Payer: Self-pay | Admitting: Obstetrics and Gynecology

## 2016-06-21 NOTE — Telephone Encounter (Signed)
Left voicemail regarding referral appointment. The information is listed below. Should the patient need to cancel or reschedule this appointment, please advise them to call the office they've been referred to in order to reschedule.  [facility] [address]  [place], [place] Phone: [phone]   Dr Collene Mares 06-30-16 @ 12pm. Please arrive 15 minutes early and bring your insurance card, photo id and list of medications.

## 2016-08-11 NOTE — Addendum Note (Signed)
Addended by: Zoila Shutter D on: 08/11/2016 12:22 PM   Modules accepted: Orders

## 2016-09-08 ENCOUNTER — Other Ambulatory Visit (INDEPENDENT_AMBULATORY_CARE_PROVIDER_SITE_OTHER): Payer: BLUE CROSS/BLUE SHIELD

## 2016-09-08 DIAGNOSIS — E559 Vitamin D deficiency, unspecified: Secondary | ICD-10-CM

## 2016-09-08 DIAGNOSIS — R7989 Other specified abnormal findings of blood chemistry: Secondary | ICD-10-CM

## 2016-09-09 LAB — VITAMIN D 25 HYDROXY (VIT D DEFICIENCY, FRACTURES): VIT D 25 HYDROXY: 31.7 ng/mL (ref 30.0–100.0)

## 2017-04-27 ENCOUNTER — Other Ambulatory Visit: Payer: Self-pay | Admitting: Obstetrics and Gynecology

## 2017-04-27 DIAGNOSIS — Z139 Encounter for screening, unspecified: Secondary | ICD-10-CM

## 2017-05-16 ENCOUNTER — Ambulatory Visit: Payer: BLUE CROSS/BLUE SHIELD

## 2017-05-25 ENCOUNTER — Ambulatory Visit
Admission: RE | Admit: 2017-05-25 | Discharge: 2017-05-25 | Disposition: A | Discharge status: Home / Self Care | Payer: BLUE CROSS/BLUE SHIELD | Source: Ambulatory Visit | Attending: Obstetrics and Gynecology | Admitting: Obstetrics and Gynecology

## 2017-05-25 DIAGNOSIS — Z139 Encounter for screening, unspecified: Secondary | ICD-10-CM

## 2017-06-13 ENCOUNTER — Other Ambulatory Visit: Payer: Self-pay

## 2017-06-13 ENCOUNTER — Ambulatory Visit: Payer: BLUE CROSS/BLUE SHIELD | Admitting: Obstetrics and Gynecology

## 2017-06-13 ENCOUNTER — Encounter: Payer: Self-pay | Admitting: Obstetrics and Gynecology

## 2017-06-13 ENCOUNTER — Other Ambulatory Visit (HOSPITAL_COMMUNITY)
Admission: RE | Admit: 2017-06-13 | Discharge: 2017-06-13 | Disposition: A | Discharge status: Home / Self Care | Payer: BLUE CROSS/BLUE SHIELD | Source: Ambulatory Visit | Attending: Obstetrics and Gynecology | Admitting: Obstetrics and Gynecology

## 2017-06-13 VITALS — BP 114/70 | HR 68 | Resp 16 | Ht 68.0 in | Wt 227.0 lb

## 2017-06-13 DIAGNOSIS — Z113 Encounter for screening for infections with a predominantly sexual mode of transmission: Secondary | ICD-10-CM

## 2017-06-13 DIAGNOSIS — Z01419 Encounter for gynecological examination (general) (routine) without abnormal findings: Secondary | ICD-10-CM | POA: Diagnosis not present

## 2017-06-13 NOTE — Progress Notes (Signed)
42 y.o. G0P0000 Single African American female here for annual exam.    Saw Dr. Suzette Battiest for consultation for elevated DHEAS.  Told it was due to increased insulin levels, stress, and possible PCOS per patinet.  Also dx with prediabetes.  Loosing weight, about 15 pounds.  Exercising more.    When she is stressed has pain in her left side.  Constipation.   Stress at work. Seeing a Marketing executive.  Menses are monthly, 4 days.   Desires a pap today and STD testing.   Did genetic counseling but not testing.   PCP:  Dr. Dow Adolph - [place], [place].  Has a list of local PCPs she may switch to.   Patient's last menstrual period was 05/27/2017.           Sexually active: Yes.    The current method of family planning is condoms all of the time.    Exercising: Yes.    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 She has never used smokeless tobacco. She reports that she drinks alcohol. She reports that she does not use drugs.  Past Medical History:  Diagnosis Date  . Abnormal Pap smear of cervix 06-02-14   LGSIL:neg HR HPV (1st abn. pap)  . Cyst of right breast    --stable in 2015 U/S done in [place], [place]  . Elevated DHEA ([place]) 05/21/15   level = 343  . Family history of breast cancer   . Vitamin D deficiency 05/21/15   level = 10    Past Surgical History:  Procedure Laterality Date  . UMBILICAL HERNIA REPAIR     --as a child    Current Outpatient Medications  Medication Sig Dispense Refill  . Mefenamic Acid 250 MG CAPS   2   No current facility-administered medications for this visit.     Family History  Problem Relation Age of  Onset  . Breast cancer Mother 50  . Diabetes Mother   . Hypertension Mother   . Hyperlipidemia Mother   . Thyroid disease Mother   . Transient ischemic attack Mother   . Breast cancer Paternal Aunt 43       bil. Breast ca--deceased CHF  . Breast cancer Cousin 83       maternal first cousin died Breast ca age 41  . Hyperlipidemia Father   . HIV/AIDS Maternal Uncle   . Cancer Paternal Uncle        NOS - needed BMT  . Cancer Maternal Grandmother 55       adrenal gland cancer  . Breast cancer Paternal Grandmother        dx in her mid to late 10s  . Aneurysm Maternal Aunt 26       Brain  . Kidney disease Maternal Uncle   . Cancer Other        2 maternal great uncles with cancer NOS    ROS:  Pertinent items are noted in HPI.  Otherwise, a comprehensive ROS was negative.  Exam:   BP 114/70 (BP Location: Right Arm, Patient Position: Sitting, Cuff Size: Large)   Pulse 68   Resp 16  Ht 5\' 8"  (1.727 m)   Wt 227 lb (103 kg)   LMP 05/27/2017   BMI 34.52 kg/m     General appearance: alert, cooperative and appears stated age Head: Normocephalic, without obvious abnormality, atraumatic Neck: no adenopathy, supple, symmetrical, trachea midline and thyroid normal to inspection and palpation Lungs: clear to auscultation bilaterally Breasts: normal appearance, no masses or tenderness, No nipple retraction or dimpling, No nipple discharge or bleeding, No axillary or supraclavicular adenopathy Heart: regular rate and rhythm Abdomen: soft, non-tender; no masses, no organomegaly Extremities: extremities normal, atraumatic, no cyanosis or edema Skin: Skin color, texture, turgor normal. No rashes or lesions Lymph nodes: Cervical, supraclavicular, and axillary nodes normal. No abnormal inguinal nodes palpated Neurologic: Grossly normal  Pelvic: External genitalia:  no lesions              Urethra:  normal appearing urethra with no masses, tenderness or lesions              Bartholins and  Skenes: normal                 Vagina: normal appearing vagina with normal color and discharge, no lesions              Cervix: no lesions              Pap taken: Yes.   Bimanual Exam:  Uterus:  normal size, contour, position, consistency, mobility, non-tender              Adnexa: no mass, fullness, tenderness              Rectal exam: Yes.    Confirms.              Anus:  normal sphincter tone, no lesions  Chaperone was present for exam.  Assessment:   Well woman visit with normal exam. Hx prior LGSIL. Hx elevated DHEAS. 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  Care through endocrinology.  FH of breast cancer. Declines genetic testing again.  Hx low vit D.  Anxiety and stress.   Plan: Mammogram - 3D - yearly.  We also discussed breast MRI.   Recommended self breast awareness. Pap and HR HPV as above. Guidelines for Calcium, Vitamin D, regular exercise program including cardiovascular and weight bearing exercise. Routine labs and STD testing today.  I encouraged her to continue with her counseling.  If stress/anxiety is not controlled, may need medical therapy.  Follow up annually and prn.   After visit summary provided.

## 2017-06-13 NOTE — Patient Instructions (Signed)
EXERCISE AND DIET:  We recommended that you start or continue a regular exercise program for good health. Regular exercise means any activity that makes your heart beat faster and makes you sweat.  We recommend exercising at least 30 minutes per day at least 3 days a week, preferably 4 or 5.  We also recommend a diet low in fat and sugar.  Inactivity, poor dietary choices and obesity can cause diabetes, heart attack, stroke, and kidney damage, among others.    ALCOHOL AND SMOKING:  Women should limit their alcohol intake to no more than 7 drinks/beers/glasses of wine (combined, not each!) per week. Moderation of alcohol intake to this level decreases your risk of breast cancer and liver damage. And of course, no recreational drugs are part of a healthy lifestyle.  And absolutely no smoking or even second hand smoke. Most people know smoking can cause heart and lung diseases, but did you know it also contributes to weakening of your bones? Aging of your skin?  Yellowing of your teeth and nails?  CALCIUM AND VITAMIN D:  Adequate intake of calcium and Vitamin D are recommended.  The recommendations for exact amounts of these supplements seem to change often, but generally speaking 600 mg of calcium (either carbonate or citrate) and 800 units of Vitamin D per day seems prudent. Certain women may benefit from higher intake of Vitamin D.  If you are among these women, your doctor will have told you during your visit.    PAP SMEARS:  Pap smears, to check for cervical cancer or precancers,  have traditionally been done yearly, although recent scientific advances have shown that most women can have pap smears less often.  However, every woman still should have a physical exam from her gynecologist every year. It will include a breast check, inspection of the vulva and vagina to check for abnormal growths or skin changes, a visual exam of the cervix, and then an exam to evaluate the size and shape of the uterus and  ovaries.  And after 43 years of age, a rectal exam is indicated to check for rectal cancers. We will also provide age appropriate advice regarding health maintenance, like when you should have certain vaccines, screening for sexually transmitted diseases, bone density testing, colonoscopy, mammograms, etc.   MAMMOGRAMS:  All women over 40 years old should have a yearly mammogram. Many facilities now offer a "3D" mammogram, which may cost around $50 extra out of pocket. If possible,  we recommend you accept the option to have the 3D mammogram performed.  It both reduces the number of women who will be called back for extra views which then turn out to be normal, and it is better than the routine mammogram at detecting truly abnormal areas.    COLONOSCOPY:  Colonoscopy to screen for colon cancer is recommended for all women at age 50.  We know, you hate the idea of the prep.  We agree, BUT, having colon cancer and not knowing it is worse!!  Colon cancer so often starts as a polyp that can be seen and removed at colonscopy, which can quite literally save your life!  And if your first colonoscopy is normal and you have no family history of colon cancer, most women don't have to have it again for 10 years.  Once every ten years, you can do something that may end up saving your life, right?  We will be happy to help you get it scheduled when you are ready.    Be sure to check your insurance coverage so you understand how much it will cost.  It may be covered as a preventative service at no cost, but you should check your particular policy.      Living With Anxiety After being diagnosed with an anxiety disorder, you may be relieved to know why you have felt or behaved a certain way. It is natural to also feel overwhelmed about the treatment ahead and what it will mean for your life. With care and support, you can manage this condition and recover from it. How to cope with anxiety Dealing with stress Stress is  your body's reaction to life changes and events, both good and bad. Stress can last just a few hours or it can be ongoing. Stress can play a major role in anxiety, so it is important to learn both how to cope with stress and how to think about it differently. Talk with your health care provider or a counselor to learn more about stress reduction. He or she may suggest some stress reduction techniques, such as:  Music therapy. This can include creating or listening to music that you enjoy and that inspires you.  Mindfulness-based meditation. This involves being aware of your normal breaths, rather than trying to control your breathing. It can be done while sitting or walking.  Centering prayer. This is a kind of meditation that involves focusing on a word, phrase, or sacred image that is meaningful to you and that brings you peace.  Deep breathing. To do this, expand your stomach and inhale slowly through your nose. Hold your breath for 3-5 seconds. Then exhale slowly, allowing your stomach muscles to relax.  Self-talk. This is a skill where you identify thought patterns that lead to anxiety reactions and correct those thoughts.  Muscle relaxation. This involves tensing muscles then relaxing them.  Choose a stress reduction technique that fits your lifestyle and personality. Stress reduction techniques take time and practice. Set aside 5-15 minutes a day to do them. Therapists can offer training in these techniques. The training may be covered by some insurance plans. Other things you can do to manage stress include:  Keeping a stress diary. This can help you learn what triggers your stress and ways to control your response.  Thinking about how you respond to certain situations. You may not be able to control everything, but you can control your reaction.  Making time for activities that help you relax, and not feeling guilty about spending your time in this way.  Therapy combined with coping  and stress-reduction skills provides the best chance for successful treatment. Medicines Medicines can help ease symptoms. Medicines for anxiety include:  Anti-anxiety drugs.  Antidepressants.  Beta-blockers.  Medicines may be used as the main treatment for anxiety disorder, along with therapy, or if other treatments are not working. Medicines should be prescribed by a health care provider. Relationships Relationships can play a big part in helping you recover. Try to spend more time connecting with trusted friends and family members. Consider going to couples counseling, taking family education classes, or going to family therapy. Therapy can help you and others better understand the condition. How to recognize changes in your condition Everyone has a different response to treatment for anxiety. Recovery from anxiety happens when symptoms decrease and stop interfering with your daily activities at home or work. This may mean that you will start to:  Have better concentration and focus.  Sleep better.  Be less irritable.  Have more energy.  Have improved memory.  It is important to recognize when your condition is getting worse. Contact your health care provider if your symptoms interfere with home or work and you do not feel like your condition is improving. Where to find help and support: You can get help and support from these sources:  Self-help groups.  Online and [employer].  A trusted spiritual leader.  Couples counseling.  Family education classes.  Family therapy.  Follow these instructions at home:  Eat a healthy diet that includes plenty of vegetables, fruits, whole grains, low-fat dairy products, and lean protein. Do not eat a lot of foods that are high in solid fats, added sugars, or salt.  Exercise. Most adults should do the following: ? Exercise for at least 150 minutes each week. The exercise should increase your heart rate and make you  sweat (moderate-intensity exercise). ? Strengthening exercises at least twice a week.  Cut down on caffeine, tobacco, alcohol, and other potentially harmful substances.  Get the right amount and quality of sleep. Most adults need 7-9 hours of sleep each night.  Make choices that simplify your life.  Take over-the-counter and prescription medicines only as told by your health care provider.  Avoid caffeine, alcohol, and certain over-the-counter cold medicines. These may make you feel worse. Ask your pharmacist which medicines to avoid.  Keep all follow-up visits as told by your health care provider. This is important. Questions to ask your health care provider  Would I benefit from therapy?  How often should I follow up with a health care provider?  How long do I need to take medicine?  Are there any long-term side effects of my medicine?  Are there any alternatives to taking medicine? Contact a health care provider if:  You have a hard time staying focused or finishing daily tasks.  You spend many hours a day feeling worried about everyday life.  You become exhausted by worry.  You start to have headaches, feel tense, or have nausea.  You urinate more than normal.  You have diarrhea. Get help right away if:  You have a racing heart and shortness of breath.  You have thoughts of hurting yourself or others. If you ever feel like you may hurt yourself or others, or have thoughts about taking your own life, get help right away. You can go to your nearest emergency department or call:  Your local emergency services (911 in the [place]  A suicide crisis helpline, such as the [facility] at (209)123-1438. This is open 24-hours a day.  Summary  Taking steps to deal with stress can help calm you.  Medicines cannot cure anxiety disorders, but they can help ease symptoms.  Family, friends, and partners can play a big part in helping you recover  from an anxiety disorder. This information is not intended to replace advice given to you by your health care provider. Make sure you discuss any questions you have with your health care provider. Document Released: 02/22/2016 Document Revised: 02/22/2016 Document Reviewed: 02/22/2016 Elsevier Interactive Patient Education  [employer].

## 2017-06-14 LAB — HEMOGLOBIN A1C
Est. average glucose Bld gHb Est-mCnc: 108 mg/dL
Hgb A1c MFr Bld: 5.4 % (ref 4.8–5.6)

## 2017-06-14 LAB — COMPREHENSIVE METABOLIC PANEL
ALBUMIN: 4.5 g/dL (ref 3.5–5.5)
ALT: 17 IU/L (ref 0–32)
AST: 22 IU/L (ref 0–40)
Albumin/Globulin Ratio: 1.6 (ref 1.2–2.2)
Alkaline Phosphatase: 54 IU/L (ref 39–117)
BILIRUBIN TOTAL: 1 mg/dL (ref 0.0–1.2)
BUN / CREAT RATIO: 6 — AB (ref 9–23)
BUN: 6 mg/dL (ref 6–24)
CHLORIDE: 104 mmol/L (ref 96–106)
CO2: 21 mmol/L (ref 20–29)
Calcium: 9.7 mg/dL (ref 8.7–10.2)
Creatinine, Ser: 1 mg/dL (ref 0.57–1.00)
GFR calc non Af Amer: 70 mL/min/{1.73_m2} (ref >59)
GFR, EST AFRICAN AMERICAN: 80 mL/min/{1.73_m2} (ref >59)
GLOBULIN, TOTAL: 2.8 g/dL (ref 1.5–4.5)
GLUCOSE: 88 mg/dL (ref 65–99)
Potassium: 4.3 mmol/L (ref 3.5–5.2)
SODIUM: 140 mmol/L (ref 134–144)
TOTAL PROTEIN: 7.3 g/dL (ref 6.0–8.5)

## 2017-06-14 LAB — CBC
HEMATOCRIT: 36.6 % (ref 34.0–46.6)
HEMOGLOBIN: 12.5 g/dL (ref 11.1–15.9)
MCH: 29.1 pg (ref 26.6–33.0)
MCHC: 34.2 g/dL (ref 31.5–35.7)
MCV: 85 fL (ref 79–97)
Platelets: 329 10*3/uL (ref 150–379)
RBC: 4.29 x10E6/uL (ref 3.77–5.28)
RDW: 15.3 % (ref 12.3–15.4)
WBC: 4.9 10*3/uL (ref 3.4–10.8)

## 2017-06-14 LAB — TSH: TSH: 0.846 u[IU]/mL (ref 0.450–4.500)

## 2017-06-14 LAB — LIPID PANEL
CHOLESTEROL TOTAL: 204 mg/dL — AB (ref 100–199)
Chol/HDL Ratio: 4.3 ratio (ref 0.0–4.4)
HDL: 48 mg/dL (ref >39)
LDL Calculated: 139 mg/dL — ABNORMAL HIGH (ref 0–99)
TRIGLYCERIDES: 83 mg/dL (ref 0–149)
VLDL CHOLESTEROL CAL: 17 mg/dL (ref 5–40)

## 2017-06-14 LAB — HEP, RPR, HIV PANEL
HEP B S AG: NEGATIVE
HIV Screen 4th Generation wRfx: NONREACTIVE
RPR Ser Ql: NONREACTIVE

## 2017-06-14 LAB — VITAMIN D 25 HYDROXY (VIT D DEFICIENCY, FRACTURES): Vit D, 25-Hydroxy: 28.9 ng/mL — ABNORMAL LOW (ref 30.0–100.0)

## 2017-06-14 LAB — HEPATITIS C ANTIBODY: Hep C Virus Ab: <0.1 s/co ratio (ref 0.0–0.9)

## 2017-06-15 LAB — CYTOLOGY - PAP
Chlamydia: NEGATIVE
DIAGNOSIS: NEGATIVE
HPV: NOT DETECTED
Neisseria Gonorrhea: NEGATIVE
Trichomonas: NEGATIVE

## 2018-04-26 NOTE — Progress Notes (Signed)
43 y.o. G0P0000 Single African American female here for annual exam.    Patient wants to discuss non-hormonal birth control options.  Having night sweats.  Wants pap.   Still seeing Dr. Suzette Battiest once yearly.  She is followed for elevated DHEAS and A1C.  Her A1C has been normal.  PCP: None    Patient's last menstrual period was 04/07/2018 (exact date).     Period Cycle (Days): 30 Period Duration (Days): 4 days Period Pattern: Regular Menstrual Flow: Moderate Menstrual Control: Maxi pad Menstrual Control Change Freq (Hours): changes every 3-4 hours on heaviest day Dysmenorrhea: None     Sexually active: Yes.    The current method of family planning is condoms everytime.    Exercising: Yes.    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    reports that she has never smoked. She has never used smokeless tobacco. She reports current alcohol use. She reports that she does not use drugs.  Past Medical History:  Diagnosis Date  . Abnormal Pap smear of cervix 06-02-14   LGSIL:neg HR HPV (1st abn. pap)  . Cyst of right breast    --stable in 2015 U/S done in [place], [place]  . Elevated DHEA ([place]) 05/21/15   level = 343  . Family history of breast cancer   . Vitamin D deficiency 05/21/15   level = 10    Past Surgical History:  Procedure Laterality Date  . UMBILICAL HERNIA REPAIR     --as a child    Current Outpatient Medications  Medication Sig Dispense Refill  . Mefenamic Acid 250 MG CAPS   2   No current facility-administered medications for this visit.     Family History  Problem Relation Age of Onset  . Breast cancer Mother 30  .  Diabetes Mother   . Hypertension Mother   . Hyperlipidemia Mother   . Thyroid disease Mother   . Transient ischemic attack Mother   . Breast cancer Paternal Aunt 39       bil. Breast ca--deceased CHF  . Breast cancer Cousin 49       maternal first cousin died Breast ca age 71  . Hyperlipidemia Father   . HIV/AIDS Maternal Uncle   . Cancer Paternal Uncle        NOS - needed BMT  . Cancer Maternal Grandmother 19       adrenal gland cancer  . Breast cancer Paternal Grandmother        dx in her mid to late 79s  . Aneurysm Maternal Aunt 26       Brain  . Kidney disease Maternal Uncle   . Cancer Other        2 maternal great uncles with cancer NOS    Review of Systems  HENT: Positive for sinus pressure.   Gastrointestinal: Positive for constipation.       Abdominal bloating  Genitourinary:       Nocturia--once nightly  All other systems reviewed and are negative.   Exam:   BP 140/82 (BP Location: Right Arm, Patient Position: Sitting, Cuff Size: Large)   Pulse 70  Resp 20   Ht 5\' 8"  (1.727 m)   Wt 227 lb 3.2 oz (103.1 kg)   LMP 04/07/2018 (Exact Date)   BMI 34.55 kg/m     General appearance: alert, cooperative and appears stated age Head: Normocephalic, without obvious abnormality, atraumatic Neck: no adenopathy, supple, symmetrical, trachea midline and thyroid normal to inspection and palpation Lungs: clear to auscultation bilaterally Breasts: normal appearance, no masses or tenderness, No nipple retraction or dimpling, No nipple discharge or bleeding, No axillary or supraclavicular adenopathy Heart: regular rate and rhythm Abdomen: soft, non-tender; no masses, no organomegaly Extremities: extremities normal, atraumatic, no cyanosis or edema Skin: Skin color, texture, turgor normal. No rashes or lesions Lymph nodes: Cervical, supraclavicular, and axillary nodes normal. No abnormal inguinal nodes palpated Neurologic: Grossly normal  Pelvic: External genitalia:  no  lesions              Urethra:  normal appearing urethra with no masses, tenderness or lesions              Bartholins and Skenes: normal                 Vagina: normal appearing vagina with normal color and discharge, no lesions              Cervix: no lesions              Pap taken: Yes.   Bimanual Exam:  Uterus:  normal size, contour, position, consistency, mobility, non-tender              Adnexa: no mass, fullness, tenderness              Rectal exam: Yes.  .  Confirms.              Anus:  normal sphincter tone, no lesions  Chaperone was present for exam.  Assessment:   Well woman visit with normal exam. Hx prior LGSIL. Hx elevated DHEAS. 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  Care through endocrinology.  FH of breast cancer.   Hx low vit D.   Plan: Mammogram screening. Recommended self breast awareness. Pap and HR HPV as above. Guidelines for Calcium, Vitamin D, regular exercise program including cardiovascular and weight bearing exercise. STD screening.  Routine labs.  Discussed Gardasil.  Discussed Copper IUD and diaphragm. I discussed genetic counseling and testing.  She will let me know if she decides to do this.  She will search for a PCP. Follow up annually and prn.   After visit summary provided.

## 2018-04-29 ENCOUNTER — Ambulatory Visit (INDEPENDENT_AMBULATORY_CARE_PROVIDER_SITE_OTHER): Payer: BLUE CROSS/BLUE SHIELD | Admitting: Obstetrics and Gynecology

## 2018-04-29 ENCOUNTER — Other Ambulatory Visit (HOSPITAL_COMMUNITY)
Admission: RE | Admit: 2018-04-29 | Discharge: 2018-04-29 | Disposition: A | Discharge status: Home / Self Care | Payer: BLUE CROSS/BLUE SHIELD | Source: Ambulatory Visit | Attending: Obstetrics and Gynecology | Admitting: Obstetrics and Gynecology

## 2018-04-29 ENCOUNTER — Encounter: Payer: Self-pay | Admitting: Obstetrics and Gynecology

## 2018-04-29 ENCOUNTER — Other Ambulatory Visit: Payer: Self-pay

## 2018-04-29 VITALS — BP 140/82 | HR 70 | Resp 20 | Ht 68.0 in | Wt 227.2 lb

## 2018-04-29 DIAGNOSIS — Z113 Encounter for screening for infections with a predominantly sexual mode of transmission: Secondary | ICD-10-CM

## 2018-04-29 DIAGNOSIS — Z01419 Encounter for gynecological examination (general) (routine) without abnormal findings: Secondary | ICD-10-CM

## 2018-04-29 NOTE — Patient Instructions (Signed)
EXERCISE AND DIET:  We recommended that you start or continue a regular exercise program for good health. Regular exercise means any activity that makes your heart beat faster and makes you sweat.  We recommend exercising at least 30 minutes per day at least 3 days a week, preferably 4 or 5.  We also recommend a diet low in fat and sugar.  Inactivity, poor dietary choices and obesity can cause diabetes, heart attack, stroke, and kidney damage, among others.    ALCOHOL AND SMOKING:  Women should limit their alcohol intake to no more than 7 drinks/beers/glasses of wine (combined, not each!) per week. Moderation of alcohol intake to this level decreases your risk of breast cancer and liver damage. And of course, no recreational drugs are part of a healthy lifestyle.  And absolutely no smoking or even second hand smoke. Most people know smoking can cause heart and lung diseases, but did you know it also contributes to weakening of your bones? Aging of your skin?  Yellowing of your teeth and nails?  CALCIUM AND VITAMIN D:  Adequate intake of calcium and Vitamin D are recommended.  The recommendations for exact amounts of these supplements seem to change often, but generally speaking 600 mg of calcium (either carbonate or citrate) and 800 units of Vitamin D per day seems prudent. Certain women may benefit from higher intake of Vitamin D.  If you are among these women, your doctor will have told you during your visit.    PAP SMEARS:  Pap smears, to check for cervical cancer or precancers,  have traditionally been done yearly, although recent scientific advances have shown that most women can have pap smears less often.  However, every woman still should have a physical exam from her gynecologist every year. It will include a breast check, inspection of the vulva and vagina to check for abnormal growths or skin changes, a visual exam of the cervix, and then an exam to evaluate the size and shape of the uterus and  ovaries.  And after 44 years of age, a rectal exam is indicated to check for rectal cancers. We will also provide age appropriate advice regarding health maintenance, like when you should have certain vaccines, screening for sexually transmitted diseases, bone density testing, colonoscopy, mammograms, etc.   MAMMOGRAMS:  All women over 40 years old should have a yearly mammogram. Many facilities now offer a "3D" mammogram, which may cost around $50 extra out of pocket. If possible,  we recommend you accept the option to have the 3D mammogram performed.  It both reduces the number of women who will be called back for extra views which then turn out to be normal, and it is better than the routine mammogram at detecting truly abnormal areas.    COLONOSCOPY:  Colonoscopy to screen for colon cancer is recommended for all women at age 50.  We know, you hate the idea of the prep.  We agree, BUT, having colon cancer and not knowing it is worse!!  Colon cancer so often starts as a polyp that can be seen and removed at colonscopy, which can quite literally save your life!  And if your first colonoscopy is normal and you have no family history of colon cancer, most women don't have to have it again for 10 years.  Once every ten years, you can do something that may end up saving your life, right?  We will be happy to help you get it scheduled when you are ready.    Be sure to check your insurance coverage so you understand how much it will cost.  It may be covered as a preventative service at no cost, but you should check your particular policy.      Intrauterine Device Information An intrauterine device (IUD) is a medical device that is inserted in the uterus to prevent pregnancy. It is a small, T-shaped device that has one or two nylon strings hanging down from it. The strings hang out of the lower part of the uterus (cervix) to allow for future IUD removal. There are two types of IUDs available:  Hormone IUD. This  type of IUD is made of plastic and contains the hormone progestin (synthetic progesterone). A hormone IUD may last 3-5 years.  Copper IUD. This type of IUD has copper wire wrapped around it. A copper IUD may last up to 10 years. How is an IUD inserted? An IUD is inserted through the vagina and placed into the uterus with a minor medical procedure. The exact procedure for IUD insertion may vary among health care providers and hospitals. How does an IUD work? Synthetic progesterone in a hormonal IUD prevents pregnancy by:  Thickening cervical mucus to prevent sperm from entering the uterus.  Thinning the uterine lining to prevent a fertilized egg from being implanted there. Copper in a copper IUD prevents pregnancy by making the uterus and fallopian tubes produce a fluid that kills sperm. What are the advantages of an IUD? Advantages of either type of IUD  It is highly effective in preventing pregnancy.  It is reversible. You can become pregnant shortly after the IUD is removed.  It is low-maintenance and can stay in place for a long time.  There are no estrogen-related side effects.  It can be used when breastfeeding.  It is not associated with weight gain.  It can be inserted right after childbirth, an abortion, or a miscarriage. Advantages of a hormone IUD  If it is inserted within 7 days of your period starting, it works right after it is inserted. If the hormone IUD is inserted at any other time in your cycle, you will need to use a backup method of birth control for 7 days after insertion.  It can make menstrual periods lighter.  It can reduce menstrual cramping.  It can be used for 3-5 years. Advantages of a copper IUD  It works right after it is inserted.  It can be used as a form of emergency birth control if it is inserted within 5 days after having unprotected sex.  It does not interfere with your body's natural hormones.  It can be used for 10 years. What are  the disadvantages of an IUD?  An IUD may cause irregular menstrual bleeding for a period of time after insertion.  You may have pain during insertion and have cramping and vaginal bleeding after insertion.  An IUD may cut the uterus (uterine perforation) when it is inserted. This is rare.  An IUD may cause pelvic inflammatory disease (PID), which is an infection in the uterus and fallopian tubes. This is rare, and it usually happens during the first 20 days after the IUD is inserted.  A copper IUD can make your menstrual flow heavier and more painful. How is an IUD removed?  You will lie on your back with your knees bent and your feet in footrests (stirrups).  A device will be inserted into your vagina to spread apart the vaginal walls (speculum). This will allow your  health care provider to see the strings attached to the IUD.  Your health care provider will use a small instrument (forceps) to grasp the IUD strings and pull firmly until the IUD is removed. You may have some discomfort when the IUD is removed. Your health care provider may recommend taking over-the-counter pain relievers, such as ibuprofen, before the procedure. You may also have minor spotting for a few days after the procedure. The exact procedure for IUD removal may vary among health care providers and hospitals. Is the IUD right for me? Your health care provider will make sure you are a good candidate for an IUD and will discuss the advantages, disadvantages, and possible side effects with you. Summary  An intrauterine device (IUD) is a medical device that is inserted in the uterus to prevent pregnancy. It is a small, T-shaped device that has one or two nylon strings hanging down from it.  A hormone IUD contains the hormone progestin (synthetic progesterone). A copper IUD has copper wire wrapped around it.  Synthetic progesterone in a hormone IUD prevents pregnancy by thickening cervical mucus and thinning the walls  of the uterus. Copper in a copper IUD prevents pregnancy by making the uterus and fallopian tubes produce a fluid that kills sperm.  A hormone IUD can be left in place for 3-5 years. A copper IUD can be left in place for up to 10 years.  An IUD is inserted and removed by a health care provider. You may feel some pain during insertion and removal. Your health care provider may recommend taking over-the-counter pain medicine, such as ibuprofen, before an IUD procedure. This information is not intended to replace advice given to you by your health care provider. Make sure you discuss any questions you have with your health care provider. Document Released: 02/01/2004 Document Revised: 03/28/2016 Document Reviewed: 03/28/2016 Elsevier Interactive Patient Education  2019 [employer].

## 2018-04-30 LAB — CBC
HEMOGLOBIN: 12.2 g/dL (ref 11.1–15.9)
Hematocrit: 36.4 % (ref 34.0–46.6)
MCH: 29.8 pg (ref 26.6–33.0)
MCHC: 33.5 g/dL (ref 31.5–35.7)
MCV: 89 fL (ref 79–97)
Platelets: 301 10*3/uL (ref 150–450)
RBC: 4.09 x10E6/uL (ref 3.77–5.28)
RDW: 13.1 % (ref 11.7–15.4)
WBC: 6.8 10*3/uL (ref 3.4–10.8)

## 2018-04-30 LAB — HEP, RPR, HIV PANEL
HIV SCREEN 4TH GENERATION: NONREACTIVE
Hepatitis B Surface Ag: NEGATIVE
RPR: NONREACTIVE

## 2018-04-30 LAB — COMPREHENSIVE METABOLIC PANEL
ALBUMIN: 4.4 g/dL (ref 3.8–4.8)
ALT: 17 IU/L (ref 0–32)
AST: 21 IU/L (ref 0–40)
Albumin/Globulin Ratio: 1.6 (ref 1.2–2.2)
Alkaline Phosphatase: 58 IU/L (ref 39–117)
BILIRUBIN TOTAL: 0.7 mg/dL (ref 0.0–1.2)
BUN / CREAT RATIO: 7 — AB (ref 9–23)
BUN: 6 mg/dL (ref 6–24)
CHLORIDE: 105 mmol/L (ref 96–106)
CO2: 22 mmol/L (ref 20–29)
Calcium: 8.9 mg/dL (ref 8.7–10.2)
Creatinine, Ser: 0.9 mg/dL (ref 0.57–1.00)
GFR calc non Af Amer: 79 mL/min/{1.73_m2} (ref >59)
GFR, EST AFRICAN AMERICAN: 91 mL/min/{1.73_m2} (ref >59)
GLUCOSE: 94 mg/dL (ref 65–99)
Globulin, Total: 2.7 g/dL (ref 1.5–4.5)
Potassium: 3.7 mmol/L (ref 3.5–5.2)
Sodium: 140 mmol/L (ref 134–144)
TOTAL PROTEIN: 7.1 g/dL (ref 6.0–8.5)

## 2018-04-30 LAB — TSH: TSH: 1.03 u[IU]/mL (ref 0.450–4.500)

## 2018-04-30 LAB — HEPATITIS C ANTIBODY

## 2018-04-30 LAB — LIPID PANEL
CHOL/HDL RATIO: 4.1 ratio (ref 0.0–4.4)
CHOLESTEROL TOTAL: 192 mg/dL (ref 100–199)
HDL: 47 mg/dL (ref >39)
LDL Calculated: 129 mg/dL — ABNORMAL HIGH (ref 0–99)
TRIGLYCERIDES: 82 mg/dL (ref 0–149)
VLDL Cholesterol Cal: 16 mg/dL (ref 5–40)

## 2018-04-30 LAB — VITAMIN D 25 HYDROXY (VIT D DEFICIENCY, FRACTURES): Vit D, 25-Hydroxy: 17.3 ng/mL — ABNORMAL LOW (ref 30.0–100.0)

## 2018-05-01 LAB — CYTOLOGY - PAP
Chlamydia: NEGATIVE
Diagnosis: NEGATIVE
HPV: NOT DETECTED
Neisseria Gonorrhea: NEGATIVE
TRICH (WINDOWPATH): NEGATIVE

## 2018-06-19 ENCOUNTER — Other Ambulatory Visit: Payer: Self-pay | Admitting: Obstetrics and Gynecology

## 2018-06-19 DIAGNOSIS — Z1231 Encounter for screening mammogram for malignant neoplasm of breast: Secondary | ICD-10-CM

## 2018-08-23 ENCOUNTER — Ambulatory Visit
Admission: RE | Admit: 2018-08-23 | Discharge: 2018-08-23 | Disposition: A | Discharge status: Home / Self Care | Payer: BLUE CROSS/BLUE SHIELD | Source: Ambulatory Visit | Attending: Obstetrics and Gynecology | Admitting: Obstetrics and Gynecology

## 2018-08-23 ENCOUNTER — Other Ambulatory Visit: Payer: Self-pay

## 2018-08-23 DIAGNOSIS — Z1231 Encounter for screening mammogram for malignant neoplasm of breast: Secondary | ICD-10-CM

## 2019-03-14 DIAGNOSIS — R7303 Prediabetes: Secondary | ICD-10-CM

## 2019-03-14 HISTORY — DX: Prediabetes: R73.03

## 2019-05-02 ENCOUNTER — Ambulatory Visit: Payer: BLUE CROSS/BLUE SHIELD | Admitting: Obstetrics and Gynecology

## 2019-05-12 ENCOUNTER — Ambulatory Visit: Payer: BC Managed Care – PPO | Attending: Internal Medicine

## 2019-05-12 ENCOUNTER — Other Ambulatory Visit: Payer: Self-pay

## 2019-05-12 DIAGNOSIS — Z20822 Contact with and (suspected) exposure to covid-19: Secondary | ICD-10-CM

## 2019-05-13 ENCOUNTER — Ambulatory Visit: Payer: BLUE CROSS/BLUE SHIELD | Admitting: Obstetrics and Gynecology

## 2019-05-14 LAB — NOVEL CORONAVIRUS, NAA: SARS-CoV-2, NAA: NOT DETECTED

## 2019-05-18 ENCOUNTER — Ambulatory Visit: Payer: Self-pay | Attending: Internal Medicine

## 2019-05-18 DIAGNOSIS — Z23 Encounter for immunization: Secondary | ICD-10-CM | POA: Insufficient documentation

## 2019-05-18 NOTE — Progress Notes (Signed)
   Covid-19 Vaccination Clinic  Name:  [name]    MRN: [id number] DOB: November 12, 1975  05/18/2019  Gail Erickson was observed post Covid-19 immunization for 15 minutes without incident. She was provided with Vaccine Information Sheet and instruction to access the V-Safe system.   [name] was instructed to call 911 with any severe reactions post vaccine: [name] Difficulty breathing  . Swelling of face and throat  . A fast heartbeat  . A bad rash all over body  . Dizziness and weakness   Immunizations Administered    Name Date Dose VIS Date Route   Pfizer COVID-19 Vaccine 05/18/2019  4:10 PM 0.3 mL 02/21/2019 Intramuscular   Manufacturer: [place]   Lot: [id number]   [place]: [id number]

## 2019-05-29 NOTE — Progress Notes (Signed)
44 y.o. G0P0000 Single African American female here for annual exam.    States her menstrual cycle is changing.  Occurs monthly and lasts 4 days. Only one day has steady flow.  Having night sweats off and on.   States she has migraines without aura.  She has headache accompanied by nausea and sometimes has light sensitivity.  HA occur a day or two prior to period and last 1 - 2 days into her menstrual cycle.  Excedrin migraine treats this.   She wants STD testing today.  States her libido has increased.   Asking about HSV.  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   States she wants cervical cancer screening even if she has to pay for it out of pocket.  Increasing her activity and cleaning up her diet per patient.   States her A1C was increased in January.  Has increased hair growth.   Did her first [place] vaccine.   PCP:   None Endocrinology:  Dr. Chalmers Cater.   Patient's last menstrual period was 05/06/2019 (exact date).     Period Cycle (Days): 30 Period Duration (Days): 4 days Period Pattern: Regular Menstrual Flow: Light Menstrual Control: Thin pad Menstrual Control Change Freq (Hours): every 5-6 hours on heaviest day Dysmenorrhea: (!) Moderate Dysmenorrhea Symptoms: Cramping, Headache(headaches prior to cycle)     Sexually active: Yes.    The current method of family planning is condoms everytime.    Exercising: Yes.    walking, hiking and stationary bike Smoker:  no  Health Maintenance: Pap:  04/30/19 - neg pap, neg HR HPV, 06-13-17 Neg:Neg HR HPV, 06-01-16 Neg:Neg HR HPV, 05-21-15 Neg:Neg HR HPV History of abnormal Pap:  Yes,  LGSIL:Neg HR HPV;colposcopy 07-13-14 showed LGSIL MMG: 08-23-18 3D/Neg/density C/BiRads1 Colonoscopy:  n/a BMD:   n/a  Result  n/a TDaP:  05-21-15 Gardasil:   no HIV: 04-29-18 NR Hep C: 04-29-18 Neg Screening Labs:  PCP and endocrinology.   reports that she has never smoked. She has never used smokeless tobacco. She reports current alcohol use. She  reports that she does not use drugs.  Past Medical History:  Diagnosis Date  . Abnormal Pap smear of cervix 06-02-14   LGSIL:neg HR HPV (1st abn. pap)  . Cyst of right breast    --stable in 2015 U/S done in [place], [place]  . Elevated DHEA ([place]) 05/21/15   level = 343  . Family history of breast cancer   . 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 Migraine without aura   . Prediabetes 2021   sees Dr.Balan  . Vitamin D deficiency 05/21/15   level = 10    Past Surgical History:  Procedure Laterality Date  . UMBILICAL HERNIA REPAIR     --as a child    Current Outpatient Medications  Medication Sig Dispense Refill  . Ascorbic Acid (VITAMIN C) 1000 MG tablet Take 1,000 mg by mouth daily.    . Aspirin-Acetaminophen-Caffeine (EXCEDRIN MIGRAINE PO) Take 1 tablet by mouth as needed.    . Cholecalciferol (VITAMIN D3) 125 MCG (5000 UT) CAPS Take 1 capsule by mouth daily.    . Probiotic Product (PROBIOTIC-10 PO) Take 1 tablet by mouth.    . pseudoephedrine (SUDAFED) 30 MG tablet Take 30 mg by mouth every 4 (four) hours as needed for congestion.     No current facility-administered medications for this visit.    Family History  Problem Relation Age of Onset  . Breast cancer  Mother 19  . Diabetes Mother   . Hypertension Mother   . Hyperlipidemia Mother   . Thyroid disease Mother   . Transient ischemic attack Mother   . Breast cancer Paternal Aunt 4       bil. Breast ca--deceased CHF  . Breast cancer Cousin 31       maternal first cousin died Breast ca age 69  . Hyperlipidemia Father   . HIV/AIDS Maternal Uncle   . Cancer Paternal Uncle        NOS - needed BMT  . Cancer Maternal Grandmother 53       adrenal gland cancer  . Breast cancer Paternal Grandmother        dx in her mid to late 58s  . Aneurysm Maternal Aunt 26       Brain  . Kidney disease Maternal Uncle   . Cancer Other        2 maternal great uncles with cancer NOS  . Breast cancer Maternal Aunt 72        Breast cancer    Review of Systems  Constitutional:       Occ. Abdominal discomfort/bloating  Skin:       More facial hair recently  All other systems reviewed and are negative.   Exam:   BP 130/78 (Cuff Size: Large)   Pulse 70   Temp 98.9 F (37.2 C) (Temporal)   Resp 14   Ht 5\' 8"  (1.727 m)   Wt 243 lb (110.2 kg)   LMP 05/06/2019 (Exact Date)   BMI 36.95 kg/m     General appearance: alert, cooperative and appears stated age Head: normocephalic, without obvious abnormality, atraumatic Neck: no adenopathy, supple, symmetrical, trachea midline and thyroid normal to inspection and palpation Lungs: clear to auscultation bilaterally Breasts: normal appearance, no masses or tenderness, No nipple retraction or dimpling, No nipple discharge or bleeding, No axillary adenopathy Heart: regular rate and rhythm Abdomen: soft, non-tender; no masses, no organomegaly Extremities: extremities normal, atraumatic, no cyanosis or edema Skin: skin color, texture, turgor normal. No rashes or lesions Lymph nodes: cervical, supraclavicular, and axillary nodes normal. Neurologic: grossly normal  Pelvic: External genitalia:  no lesions              No abnormal inguinal nodes palpated.              Urethra:  normal appearing urethra with no masses, tenderness or lesions              Bartholins and Skenes: normal                 Vagina: normal appearing vagina with normal color and discharge, no lesions              Cervix: no lesions              Pap taken: Yes.   Bimanual Exam:  Uterus:  normal size, contour, position, consistency, mobility, non-tender              Adnexa: no mass, fullness, tenderness              Rectal exam: Yes.  .  Confirms.              Anus:  normal sphincter tone, no lesions  Chaperone was present for exam.  Assessment:   Well woman visit with normal exam. Hx priorLGSIL.  Neg HR HPV.  Hx elevated DHEAS. 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 Care through endocrinology. FH of breast  cancer.  Strong on maternal side.   Did counseling but not testing.  Hx low vit D.  HSV 1.  Hx migraine without aura.   Plan: Mammogram screening discussed. Self breast awareness reviewed. Pap and HR HPV as above.  New guidelines discussed.  Guidelines for Calcium, Vitamin D, regular exercise program including cardiovascular and weight bearing exercise. STD screening.  Routine labs and vit D.  Gardasil vaccine discussed.   She declines.   Refer back to genetics counselor for consultation and testing.  Follow up annually and prn.   After visit summary provided.

## 2019-06-02 ENCOUNTER — Ambulatory Visit (INDEPENDENT_AMBULATORY_CARE_PROVIDER_SITE_OTHER): Payer: BC Managed Care – PPO | Admitting: Obstetrics and Gynecology

## 2019-06-02 ENCOUNTER — Other Ambulatory Visit: Payer: Self-pay

## 2019-06-02 ENCOUNTER — Encounter: Payer: Self-pay | Admitting: Obstetrics and Gynecology

## 2019-06-02 ENCOUNTER — Telehealth: Payer: Self-pay | Admitting: Obstetrics and Gynecology

## 2019-06-02 ENCOUNTER — Other Ambulatory Visit (HOSPITAL_COMMUNITY)
Admission: RE | Admit: 2019-06-02 | Discharge: 2019-06-02 | Disposition: A | Discharge status: Home / Self Care | Payer: BC Managed Care – PPO | Source: Ambulatory Visit | Attending: Obstetrics and Gynecology | Admitting: Obstetrics and Gynecology

## 2019-06-02 VITALS — BP 130/78 | HR 70 | Temp 98.9°F | Resp 14 | Ht 68.0 in | Wt 243.0 lb

## 2019-06-02 DIAGNOSIS — Z01419 Encounter for gynecological examination (general) (routine) without abnormal findings: Secondary | ICD-10-CM | POA: Insufficient documentation

## 2019-06-02 DIAGNOSIS — Z113 Encounter for screening for infections with a predominantly sexual mode of transmission: Secondary | ICD-10-CM | POA: Insufficient documentation

## 2019-06-02 DIAGNOSIS — Z803 Family history of malignant neoplasm of breast: Secondary | ICD-10-CM | POA: Diagnosis not present

## 2019-06-02 NOTE — Patient Instructions (Signed)

## 2019-06-03 LAB — CYTOLOGY - PAP
Chlamydia: NEGATIVE
Comment: NEGATIVE
Comment: NEGATIVE
Comment: NEGATIVE
Comment: NORMAL
Diagnosis: NEGATIVE
High risk HPV: NEGATIVE
Neisseria Gonorrhea: NEGATIVE
Trichomonas: NEGATIVE

## 2019-06-04 LAB — COMPREHENSIVE METABOLIC PANEL
ALT: 20 IU/L (ref 0–32)
AST: 29 IU/L (ref 0–40)
Albumin/Globulin Ratio: 1.7 (ref 1.2–2.2)
Albumin: 4.5 g/dL (ref 3.8–4.8)
Alkaline Phosphatase: 56 IU/L (ref 39–117)
BUN/Creatinine Ratio: 6 — ABNORMAL LOW (ref 9–23)
BUN: 6 mg/dL (ref 6–24)
Bilirubin Total: 0.7 mg/dL (ref 0.0–1.2)
CO2: 23 mmol/L (ref 20–29)
Calcium: 9.2 mg/dL (ref 8.7–10.2)
Chloride: 103 mmol/L (ref 96–106)
Creatinine, Ser: 0.98 mg/dL (ref 0.57–1.00)
GFR calc Af Amer: 81 mL/min/{1.73_m2} (ref >59)
GFR calc non Af Amer: 70 mL/min/{1.73_m2} (ref >59)
Globulin, Total: 2.6 g/dL (ref 1.5–4.5)
Glucose: 85 mg/dL (ref 65–99)
Potassium: 3.8 mmol/L (ref 3.5–5.2)
Sodium: 139 mmol/L (ref 134–144)
Total Protein: 7.1 g/dL (ref 6.0–8.5)

## 2019-06-04 LAB — LIPID PANEL
Chol/HDL Ratio: 4 ratio (ref 0.0–4.4)
Cholesterol, Total: 194 mg/dL (ref 100–199)
HDL: 48 mg/dL (ref >39)
LDL Chol Calc (NIH): 131 mg/dL — ABNORMAL HIGH (ref 0–99)
Triglycerides: 83 mg/dL (ref 0–149)
VLDL Cholesterol Cal: 15 mg/dL (ref 5–40)

## 2019-06-04 LAB — VITAMIN D 25 HYDROXY (VIT D DEFICIENCY, FRACTURES): Vit D, 25-Hydroxy: 32.8 ng/mL (ref 30.0–100.0)

## 2019-06-04 LAB — HEPATITIS C ANTIBODY: Hep C Virus Ab: <0.1 s/co ratio (ref 0.0–0.9)

## 2019-06-04 LAB — CBC
Hematocrit: 35.8 % (ref 34.0–46.6)
Hemoglobin: 11.6 g/dL (ref 11.1–15.9)
MCH: 28.3 pg (ref 26.6–33.0)
MCHC: 32.4 g/dL (ref 31.5–35.7)
MCV: 87 fL (ref 79–97)
Platelets: 314 10*3/uL (ref 150–450)
RBC: 4.1 x10E6/uL (ref 3.77–5.28)
RDW: 13.8 % (ref 11.7–15.4)
WBC: 5.3 10*3/uL (ref 3.4–10.8)

## 2019-06-04 LAB — HEP, RPR, HIV PANEL
HIV Screen 4th Generation wRfx: NONREACTIVE
Hepatitis B Surface Ag: NEGATIVE
RPR Ser Ql: NONREACTIVE

## 2019-06-08 ENCOUNTER — Ambulatory Visit: Payer: BC Managed Care – PPO | Attending: Internal Medicine

## 2019-06-08 DIAGNOSIS — Z23 Encounter for immunization: Secondary | ICD-10-CM

## 2019-06-08 NOTE — Progress Notes (Signed)
   Covid-19 Vaccination Clinic  Name:  Gail Erickson    MRN: [id number] DOB: [birth year]  06/08/2019  Gail Erickson was observed post Covid-19 immunization for 15 minutes without incident. She was provided with Vaccine Information Sheet and instruction to access the V-Safe system.   Gail Erickson was instructed to call 911 with any severe reactions post vaccine: [name] Difficulty breathing  . Swelling of face and throat  . A fast heartbeat  . A bad rash all over body  . Dizziness and weakness   Immunizations Administered    Name Date Dose VIS Date Route   Pfizer COVID-19 Vaccine 06/08/2019  2:27 PM 0.3 mL 02/21/2019 Intramuscular   Manufacturer: [place]   Lot: [id number]   [place]: [id number]

## 2019-07-20 NOTE — Telephone Encounter (Signed)
Opened in error

## 2019-08-29 ENCOUNTER — Telehealth: Payer: Self-pay

## 2019-08-29 ENCOUNTER — Other Ambulatory Visit: Payer: Self-pay | Admitting: Obstetrics and Gynecology

## 2019-08-29 DIAGNOSIS — Z1231 Encounter for screening mammogram for malignant neoplasm of breast: Secondary | ICD-10-CM

## 2019-08-29 NOTE — Telephone Encounter (Signed)
Patient is calling in regards to mammogram order questions.

## 2019-08-29 NOTE — Telephone Encounter (Signed)
Spoke with patient. Patient tried to schedule her screening MMG, reported right breast pain and tenderness, was unable to schedule. Patient reports a sore/tender area in her right breast that has been present in the past with her menses and increased caffeine intake. Patient reports that pain has increased over the last week. Patient denies any lumps, skin changes, redness, fever/chills. Patient is requesting Dx IMG order to [facility].   Advised patient OV needed for further evaluation, patient agreeable. OV scheduled for 6/28 at 4:30pm with Dr. Quincy Simmonds. Patient declined earlier OV offered on 6/23 at 2:45pm.  Patient has additional questions for front office, call transferred to [facility] for assistance.   Last AEX 06/02/19  Routing to provider for final review. Patient is agreeable to disposition. Will close encounter.

## 2019-09-08 ENCOUNTER — Telehealth: Payer: Self-pay

## 2019-09-08 ENCOUNTER — Ambulatory Visit: Payer: Self-pay | Admitting: Obstetrics and Gynecology

## 2019-09-08 ENCOUNTER — Encounter: Payer: Self-pay | Admitting: Obstetrics and Gynecology

## 2019-09-08 NOTE — Telephone Encounter (Signed)
Encounter reviewed and closed.  

## 2019-09-08 NOTE — Telephone Encounter (Signed)
Patient canceled office visit for breast pain for (09/08/19). Patient will call back to reschedule.

## 2019-10-13 DIAGNOSIS — L680 Hirsutism: Secondary | ICD-10-CM | POA: Diagnosis not present

## 2019-10-13 DIAGNOSIS — E8881 Metabolic syndrome: Secondary | ICD-10-CM | POA: Diagnosis not present

## 2019-10-20 DIAGNOSIS — E8881 Metabolic syndrome: Secondary | ICD-10-CM | POA: Diagnosis not present

## 2019-10-20 DIAGNOSIS — L680 Hirsutism: Secondary | ICD-10-CM | POA: Diagnosis not present

## 2019-10-20 DIAGNOSIS — F4320 Adjustment disorder, unspecified: Secondary | ICD-10-CM | POA: Diagnosis not present

## 2019-10-20 DIAGNOSIS — E7800 Pure hypercholesterolemia, unspecified: Secondary | ICD-10-CM | POA: Diagnosis not present

## 2019-10-20 DIAGNOSIS — R7301 Impaired fasting glucose: Secondary | ICD-10-CM | POA: Diagnosis not present

## 2019-11-03 DIAGNOSIS — F4320 Adjustment disorder, unspecified: Secondary | ICD-10-CM | POA: Diagnosis not present

## 2019-12-08 DIAGNOSIS — F4320 Adjustment disorder, unspecified: Secondary | ICD-10-CM | POA: Diagnosis not present

## 2019-12-22 DIAGNOSIS — F4320 Adjustment disorder, unspecified: Secondary | ICD-10-CM | POA: Diagnosis not present

## 2020-01-12 DIAGNOSIS — F4320 Adjustment disorder, unspecified: Secondary | ICD-10-CM | POA: Diagnosis not present

## 2020-01-14 ENCOUNTER — Ambulatory Visit
Admission: RE | Admit: 2020-01-14 | Discharge: 2020-01-14 | Disposition: A | Discharge status: Home / Self Care | Payer: BC Managed Care – PPO | Source: Ambulatory Visit | Attending: Obstetrics and Gynecology | Admitting: Obstetrics and Gynecology

## 2020-01-14 ENCOUNTER — Other Ambulatory Visit: Payer: Self-pay

## 2020-01-14 DIAGNOSIS — Z803 Family history of malignant neoplasm of breast: Secondary | ICD-10-CM | POA: Diagnosis not present

## 2020-01-14 DIAGNOSIS — Z1231 Encounter for screening mammogram for malignant neoplasm of breast: Secondary | ICD-10-CM

## 2020-01-14 DIAGNOSIS — R921 Mammographic calcification found on diagnostic imaging of breast: Secondary | ICD-10-CM | POA: Diagnosis not present

## 2020-01-19 ENCOUNTER — Other Ambulatory Visit: Payer: Self-pay | Admitting: Obstetrics and Gynecology

## 2020-01-19 DIAGNOSIS — R928 Other abnormal and inconclusive findings on diagnostic imaging of breast: Secondary | ICD-10-CM

## 2020-01-26 DIAGNOSIS — F4320 Adjustment disorder, unspecified: Secondary | ICD-10-CM | POA: Diagnosis not present

## 2020-01-30 ENCOUNTER — Other Ambulatory Visit: Payer: Self-pay

## 2020-01-30 ENCOUNTER — Other Ambulatory Visit: Payer: Self-pay | Admitting: Obstetrics and Gynecology

## 2020-01-30 ENCOUNTER — Ambulatory Visit
Admission: RE | Admit: 2020-01-30 | Discharge: 2020-01-30 | Disposition: A | Discharge status: Home / Self Care | Payer: BC Managed Care – PPO | Source: Ambulatory Visit | Attending: Obstetrics and Gynecology | Admitting: Obstetrics and Gynecology

## 2020-01-30 DIAGNOSIS — R928 Other abnormal and inconclusive findings on diagnostic imaging of breast: Secondary | ICD-10-CM

## 2020-01-30 DIAGNOSIS — N6325 Unspecified lump in the left breast, overlapping quadrants: Secondary | ICD-10-CM | POA: Diagnosis not present

## 2020-01-30 DIAGNOSIS — N6324 Unspecified lump in the left breast, lower inner quadrant: Secondary | ICD-10-CM | POA: Diagnosis not present

## 2020-01-30 DIAGNOSIS — R921 Mammographic calcification found on diagnostic imaging of breast: Secondary | ICD-10-CM | POA: Diagnosis not present

## 2020-01-30 DIAGNOSIS — Z803 Family history of malignant neoplasm of breast: Secondary | ICD-10-CM | POA: Diagnosis not present

## 2020-02-10 ENCOUNTER — Ambulatory Visit: Payer: BC Managed Care – PPO

## 2020-02-12 DIAGNOSIS — L680 Hirsutism: Secondary | ICD-10-CM | POA: Diagnosis not present

## 2020-02-12 DIAGNOSIS — N951 Menopausal and female climacteric states: Secondary | ICD-10-CM | POA: Diagnosis not present

## 2020-02-12 DIAGNOSIS — E7800 Pure hypercholesterolemia, unspecified: Secondary | ICD-10-CM | POA: Diagnosis not present

## 2020-02-12 DIAGNOSIS — E8881 Metabolic syndrome: Secondary | ICD-10-CM | POA: Diagnosis not present

## 2020-02-13 ENCOUNTER — Ambulatory Visit
Admission: RE | Admit: 2020-02-13 | Discharge: 2020-02-13 | Disposition: A | Discharge status: Home / Self Care | Payer: BC Managed Care – PPO | Source: Ambulatory Visit | Attending: Obstetrics and Gynecology | Admitting: Obstetrics and Gynecology

## 2020-02-13 ENCOUNTER — Other Ambulatory Visit: Payer: Self-pay

## 2020-02-13 DIAGNOSIS — R921 Mammographic calcification found on diagnostic imaging of breast: Secondary | ICD-10-CM

## 2020-02-13 DIAGNOSIS — C50811 Malignant neoplasm of overlapping sites of right female breast: Secondary | ICD-10-CM | POA: Diagnosis not present

## 2020-02-13 DIAGNOSIS — Z170 Estrogen receptor positive status [ER+]: Secondary | ICD-10-CM | POA: Diagnosis not present

## 2020-02-13 DIAGNOSIS — D0511 Intraductal carcinoma in situ of right breast: Secondary | ICD-10-CM | POA: Diagnosis not present

## 2020-02-16 ENCOUNTER — Other Ambulatory Visit: Payer: Self-pay | Admitting: Obstetrics and Gynecology

## 2020-02-16 DIAGNOSIS — R928 Other abnormal and inconclusive findings on diagnostic imaging of breast: Secondary | ICD-10-CM

## 2020-02-16 DIAGNOSIS — R921 Mammographic calcification found on diagnostic imaging of breast: Secondary | ICD-10-CM

## 2020-02-16 DIAGNOSIS — Z803 Family history of malignant neoplasm of breast: Secondary | ICD-10-CM

## 2020-02-17 ENCOUNTER — Telehealth: Payer: Self-pay | Admitting: Obstetrics and Gynecology

## 2020-02-17 NOTE — Telephone Encounter (Signed)
Continue MMG hold.   Encounter closed.

## 2020-02-17 NOTE — Telephone Encounter (Signed)
Routing to [employer], Therapist, sports for [employer]

## 2020-02-17 NOTE — Telephone Encounter (Signed)
Phone call to support patient in her recent diagnosis of right breast cancer.   She has an appointment to do an MRI of the breasts this weekend.   She will see her surgeon Dr. Marlou Starks on 02/24/20.   States she was using a tea for weight loss this summer, and that it caused breast pain.  She stopped the tea, and her pain resolved.   Continue in mammogram hold.

## 2020-02-18 DIAGNOSIS — F4320 Adjustment disorder, unspecified: Secondary | ICD-10-CM | POA: Diagnosis not present

## 2020-02-19 DIAGNOSIS — E7800 Pure hypercholesterolemia, unspecified: Secondary | ICD-10-CM | POA: Diagnosis not present

## 2020-02-19 DIAGNOSIS — R7301 Impaired fasting glucose: Secondary | ICD-10-CM | POA: Diagnosis not present

## 2020-02-19 DIAGNOSIS — L680 Hirsutism: Secondary | ICD-10-CM | POA: Diagnosis not present

## 2020-02-19 DIAGNOSIS — E8881 Metabolic syndrome: Secondary | ICD-10-CM | POA: Diagnosis not present

## 2020-02-20 ENCOUNTER — Ambulatory Visit: Payer: BC Managed Care – PPO

## 2020-02-21 ENCOUNTER — Ambulatory Visit
Admission: RE | Admit: 2020-02-21 | Discharge: 2020-02-21 | Disposition: A | Discharge status: Home / Self Care | Payer: BC Managed Care – PPO | Source: Ambulatory Visit | Attending: Obstetrics and Gynecology | Admitting: Obstetrics and Gynecology

## 2020-02-21 ENCOUNTER — Other Ambulatory Visit: Payer: Self-pay

## 2020-02-21 DIAGNOSIS — Z803 Family history of malignant neoplasm of breast: Secondary | ICD-10-CM

## 2020-02-21 DIAGNOSIS — R921 Mammographic calcification found on diagnostic imaging of breast: Secondary | ICD-10-CM

## 2020-02-21 DIAGNOSIS — D0511 Intraductal carcinoma in situ of right breast: Secondary | ICD-10-CM | POA: Diagnosis not present

## 2020-02-21 MED ORDER — GADOBUTROL 1 MMOL/ML IV SOLN
10.0000 mL | Freq: Once | INTRAVENOUS | Status: AC | PRN
  Administered 2020-02-21: 10 mL via INTRAVENOUS

## 2020-02-24 ENCOUNTER — Telehealth: Payer: Self-pay | Admitting: Hematology

## 2020-02-24 DIAGNOSIS — Z170 Estrogen receptor positive status [ER+]: Secondary | ICD-10-CM | POA: Diagnosis not present

## 2020-02-24 DIAGNOSIS — C50311 Malignant neoplasm of lower-inner quadrant of right female breast: Secondary | ICD-10-CM | POA: Diagnosis not present

## 2020-02-24 NOTE — Telephone Encounter (Signed)
Received referrals for both med onc and genetics from [facility]. Gail Erickson has been cld and scheduled to see Dr. Burr Medico on 12/15 at 2pm and genetics on 12/16 at 8am. Pt aware to arrive 15 minutes early for both appts.

## 2020-02-25 ENCOUNTER — Encounter: Payer: Self-pay | Admitting: Hematology

## 2020-02-25 ENCOUNTER — Other Ambulatory Visit: Payer: Self-pay

## 2020-02-25 ENCOUNTER — Inpatient Hospital Stay: Payer: BC Managed Care – PPO | Attending: Hematology | Admitting: Hematology

## 2020-02-25 VITALS — BP 136/83 | HR 66 | Temp 98.1°F | Resp 14 | Wt 222.8 lb

## 2020-02-25 DIAGNOSIS — C50411 Malignant neoplasm of upper-outer quadrant of right female breast: Secondary | ICD-10-CM

## 2020-02-25 DIAGNOSIS — Z170 Estrogen receptor positive status [ER+]: Secondary | ICD-10-CM | POA: Diagnosis not present

## 2020-02-25 DIAGNOSIS — C50811 Malignant neoplasm of overlapping sites of right female breast: Secondary | ICD-10-CM | POA: Insufficient documentation

## 2020-02-25 NOTE — Progress Notes (Signed)
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 7.3 x 5.7 x 3.0 cm area of indeterminate calcifications in medial right breast, involving the lower inner and upper inner quadrants, these extend from the retroareolar region posteriorly, in a segmental distribution, suspicious for DCIS. 2. Two probably benign left breast masses and 2 probably benign left axillary lymph nodes with borderline cortical thickening. 3. Benign right breast cyst.   02/13/2020 Initial Biopsy   Diagnosis 02/13/20 1. Breast, right, needle core biopsy, posterior extent - DUCTAL CARCINOMA IN SITU WITH CALCIFICATIONS AND NECROSIS - SEE COMMENT 2. Breast, right, needle core biopsy, mid-anterior extent - INVASIVE DUCTAL CARCINOMA - DUCTAL CARCINOMA IN SITU - SEE COMMENT Microscopic Comment 1. Based on the biopsy, the ductal carcinoma in situ has a solid pattern, high nuclear grade and measures 0.5 cm in greatest linear extent. Prognostic markers (ER/PR) are pending and will be reported in an addendum. Dr. Jeannie Done reviewed the case and agrees with the above diagnosis. These results were called to [facility] on February 16, 2020. 2. Based on the  biopsy, the carcinoma appears Nottingham grade 3 of 3 and measures 0.15 cm in greatest linear extent. Prognostic markers (ER/PR/ki-67/HER2) are pending and will be reported in an addendum.   02/13/2020 Receptors her2    1. PROGNOSTIC INDICATORS Results: IMMUNOHISTOCHEMICAL AND MORPHOMETRIC ANALYSIS PERFORMED MANUALLY Estrogen Receptor: >95%, POSITIVE, MODERATE-STRONG STAINING INTENSITY Progesterone Receptor: 5%, POSITIVE, MODERATE-STRONG STAINING INTENSITY  2. PROGNOSTIC INDICATORS Results: IMMUNOHISTOCHEMICAL AND MORPHOMETRIC ANALYSIS PERFORMED MANUALLY The tumor cells are NEGATIVE for Her2 (0). Estrogen Receptor: >95%, POSITIVE, STRONG STAINING INTENSITY Progesterone Receptor: 60%, POSITIVE, STRONG STAINING INTENSITY Proliferation Marker Ki67: 1%   02/21/2020 Breast MRI   Breast MRI 02/21/20  IMPRESSION: 1. Involving the sites of biopsy proven DCIS in the medial right breast there is a large area of ill-defined non mass enhancement middle to anterior depth, somewhat difficult to measure due to irregular margins and background enhancement but measures at least 6.3 x 3.3 x 2.0 cm. 2. No MRI evidence of malignancy in the left breast. 3. No axillary adenopathy.   02/25/2020 Initial Diagnosis   Malignant neoplasm of overlapping sites of right breast ([place])   02/25/2020 Cancer Staging   Staging form: Breast, AJCC 8th Edition - Clinical stage from 02/25/2020: Stage IIB (cT3, cN0, cM0, G3, ER+, PR+, HER2-) - Signed by [name], MD on 02/25/2020      HISTORY OF PRESENTING ILLNESS:  Gail Erickson 44 y.o. female is a here because of newly diagnosed breast cancer. The patient was referred by Dr Marlou Starks . The patient presents to the clinic today accompanied by her brother.   Her mass was found by mammogram. She did not feel the mass herself. This is her first  abnormal mammogram. She has had known cyst since she was 85 and has had mammograms since to monitor it.  She denies skin, nipple  change and denies discharge.  She has known history of migraines, no new changes, she denies chest or abdominal pain. She denies any new major pain. She was seen by Dr. Carolynne Edouard who recommended Mastectomy with SLNB.   Socially she lives in [place] and she is single. She does not have children. She work by running a distrubution center in [place]  She has a PMHx of pre-diabetic, not on medication. Her A1c is recently 5.2. She has family history of breast cancer with her mother at age 49 and her maternal aunt had breast cancer in her 26s and paternal aunt and PGM had breast cancer and her maternal aunt had breast cancer. She has genetic testing recommended 2 years ago but did not proceed. She is interested in testing now.     GYN HISTORY  Menarchal: 11 LMP: 01/2020 Contraceptive: No G0    REVIEW OF SYSTEMS:    Constitutional: Denies fevers, chills or abnormal night sweats Eyes: Denies blurriness of vision, double vision or watery eyes Ears, nose, mouth, throat, and face: Denies mucositis or sore throat Respiratory: Denies cough, dyspnea or wheezes Cardiovascular: Denies palpitation, chest discomfort or lower extremity swelling Gastrointestinal:  Denies nausea, heartburn or change in bowel habits Skin: Denies abnormal skin rashes Lymphatics: Denies new lymphadenopathy or easy bruising Neurological:Denies numbness, tingling or new weaknesses Behavioral/Psych: Mood is stable, no new changes  All other systems were reviewed with the patient and are negative.   MEDICAL HISTORY:  Past Medical History:  Diagnosis Date   Abnormal Pap smear of cervix 06-02-14   LGSIL:neg HR HPV (1st abn. 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   Migraine without aura    Prediabetes 2021   sees Dr.Balan   Vitamin D deficiency  05/21/15   level = 10    SURGICAL HISTORY: Past Surgical History:  Procedure Laterality Date   UMBILICAL HERNIA REPAIR     --as a child    SOCIAL HISTORY: Social History   Socioeconomic History   Marital status: Single    Spouse name: Not on file   Number of children: 0   Years of education: Not on file   Highest education level: Not on file  Occupational History   Not on file  Tobacco Use   Smoking status: Never Smoker   Smokeless tobacco: Never Used  Vaping Use   Vaping Use: Never used  Substance and Sexual Activity   Alcohol use: Yes    Alcohol/week: 0.0 standard drinks    Comment: 1 drink per month   Drug use: No   Sexual activity: Yes    Partners: Male    Birth control/protection: Condom    Comment: condoms everytime  Other Topics Concern   Not on file  Social History Narrative   Not on file   Social Determinants of Health   Financial Resource Strain: Not on file  Food Insecurity: Not on file  Transportation Needs: Not on file  Physical Activity: Not on file  Stress: Not on file  Social Connections: Not on file  Intimate Partner Violence: Not on file    FAMILY  HISTORY: Family History  Problem Relation Age of Onset   Breast cancer Mother 21   Diabetes Mother    Hypertension Mother    Hyperlipidemia Mother    Thyroid disease Mother    Transient ischemic attack Mother    Breast cancer Paternal Aunt 30       bil. Breast ca--deceased CHF   Breast cancer Cousin 3       maternal first cousin died Breast ca age 78   Hyperlipidemia Father    HIV/AIDS Maternal Uncle    Cancer Paternal Uncle        NOS - needed BMT   Cancer Maternal Grandmother 69       adrenal gland cancer   Breast cancer Paternal Grandmother        dx in her mid to late 37s   Aneurysm Maternal Aunt 26       Brain   Kidney disease Maternal Uncle    Cancer Other        2 maternal great uncles with cancer NOS   Breast cancer Maternal Aunt 72        Breast cancer    ALLERGIES:  has No Known Allergies.  MEDICATIONS:  Current Outpatient Medications  Medication Sig Dispense Refill   Ascorbic Acid (VITAMIN C) 1000 MG tablet Take 1,000 mg by mouth daily.     Aspirin-Acetaminophen-Caffeine (EXCEDRIN MIGRAINE PO) Take 1 tablet by mouth as needed.     Cholecalciferol (VITAMIN D3) 125 MCG (5000 UT) CAPS Take 1 capsule by mouth daily.     Multiple Vitamin (MULTIVITAMIN) tablet Take 1 tablet by mouth daily.     Probiotic Product (PROBIOTIC-10 PO) Take 1 tablet by mouth.     pseudoephedrine (SUDAFED) 30 MG tablet Take 30 mg by mouth every 4 (four) hours as needed for congestion.     No current facility-administered medications for this visit.    PHYSICAL EXAMINATION: ECOG PERFORMANCE STATUS: 0 - Asymptomatic  Vitals:   02/25/20 1420  BP: 136/83  Pulse: 66  Resp: 14  Temp: 98.1 F (36.7 C)  SpO2: 100%   Filed Weights   02/25/20 1420  Weight: 222 lb 12.8 oz (101.1 kg)    GENERAL:alert, no distress and comfortable SKIN: skin color, texture, turgor are normal, no rashes or significant lesions EYES: normal, Conjunctiva are pink and non-injected, sclera clear  NECK: supple, thyroid normal size, non-tender, without nodularity LYMPH:  no palpable lymphadenopathy in the cervical, axillary  LUNGS: clear to auscultation and percussion with normal breathing effort HEART: regular rate & rhythm and no murmurs and no lower extremity edema ABDOMEN:abdomen soft, non-tender and normal bowel sounds Musculoskeletal:no cyanosis of digits and no clubbing  NEURO: alert & oriented x 3 with fluent speech, no focal motor/sensory deficits BREAST: Palpable 4.5x5cm lump (likely hematoma) in Right breast. Left Breast exam benign.  LABORATORY DATA:  I have reviewed the data as listed CBC Latest Ref Rng & Units 06/02/2019 04/29/2018 06/13/2017  WBC 3.4 - 10.8 x10E3/uL 5.3 6.8 4.9  Hemoglobin 11.1 - 15.9 g/dL 11.6 12.2 12.5  Hematocrit 34.0 - 46.6 %  35.8 36.4 36.6  Platelets 150 - 450 x10E3/uL 314 301 329    CMP Latest Ref Rng & Units 06/02/2019 04/29/2018 06/13/2017  Glucose 65 - 99 mg/dL 85 94 88  BUN 6 - 24 mg/dL $Remove'6 6 6  'RLBpgWQ$ Creatinine 0.57 - 1.00 mg/dL 0.98 0.90 1.00  Sodium 134 - 144 mmol/L 139 140 140  Potassium 3.5 - 5.2 mmol/L 3.8 3.7  4.3  Chloride 96 - 106 mmol/L 103 105 104  CO2 20 - 29 mmol/L $RemoveB'23 22 21  'SepTbZjY$ Calcium 8.7 - 10.2 mg/dL 9.2 8.9 9.7  Total Protein 6.0 - 8.5 g/dL 7.1 7.1 7.3  Total Bilirubin 0.0 - 1.2 mg/dL 0.7 0.7 1.0  Alkaline Phos 39 - 117 IU/L 56 58 54  AST 0 - 40 IU/L $Remov'29 21 22  'GJCiCM$ ALT 0 - 32 IU/L $Remov'20 17 17     'ANXCPX$ RADIOGRAPHIC STUDIES: I have personally reviewed the radiological images as listed and agreed with the findings in the report. 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 Strong family history of breast cancer. LABS:  None.  Is EXAM: BILATERAL BREAST MRI WITH AND WITHOUT CONTRAST TECHNIQUE: Multiplanar, multisequence MR images of both breasts were obtained prior to and following the intravenous administration of 10 ml of Gadavist Three-dimensional MR images were rendered by post-processing of the original MR data on an independent workstation. The three-dimensional MR images were interpreted, and findings are reported in the following complete MRI report for this study. Three dimensional images were evaluated at the independent interpreting workstation using the DynaCAD thin client. COMPARISON:  None. FINDINGS: Breast composition: c. Heterogeneous fibroglandular tissue. Background parenchymal enhancement: Moderate. Right breast: There is susceptibility artifact in the medial right breast middle depth consistent with a biopsy marking clip. A small focus of susceptibility anterior to this may denote the second biopsy marking clip. In the medial right breast middle to anterior depth there is a large area of  ill-defined non mass enhancement, somewhat difficult to measure due to irregular margins and background enhancement but measures at least 6.3 x 3.3 x 2.0 cm (series 7, image 98). No abdominal abnormal areas of enhancement elsewhere in the right breast. Left breast: No mass or abnormal enhancement. Lymph nodes: There is a mildly prominent lymph node in the low right axilla (series 7, image 62) which has been stable on multiple prior mammograms dating back to 2018. No bilateral adenopathy. Ancillary findings:  None. IMPRESSION: 1. Involving the sites of biopsy proven DCIS in the medial right breast there is a large area of ill-defined non mass enhancement middle to anterior depth, somewhat difficult to measure due to irregular margins and background enhancement but measures at least 6.3 x 3.3 x 2.0 cm. 2. No MRI evidence of malignancy in the left breast. 3. No axillary adenopathy. RECOMMENDATION: Continued surgical planning/treatment for known malignancy. BI-RADS CATEGORY  6: Known biopsy-proven malignancy. Electronically Signed   By: Audie Pinto M.D.   On: 02/21/2020 12:19   US BREAST LTD UNI LEFT INC AXILLA  Result Date: 01/30/2020 CLINICAL DATA:  Possible mass in the mid posteromedial left breast, possible mass in the mid posterior medial right breast and calcifications in the right breast a recent screening mammogram. Strong family history of breast cancer including her mother diagnosed in her 71s, a maternal aunt, maternal niece, paternal aunt and paternal grandmother. EXAM: DIGITAL DIAGNOSTIC BILATERAL MAMMOGRAM WITH CAD AND TOMO ULTRASOUND BILATERAL BREAST COMPARISON:  Previous exam(s). ACR Breast Density Category c: The breast tissue is heterogeneously dense, which may obscure small masses. FINDINGS: 3D tomographic and 2D generated spot compression images of both breasts were obtained as well as 2D true lateral and spot magnification views of the right breast. These confirm an oval, circumscribed  mass in the mid to posterior medial left breast. There is also a  rounded, circumscribed mass in the mid to posterior medial right breast. Also demonstrated are extensive, new calcifications in the right breast extending from the posterior to the anterior portion of the breast medially, involving the lower inner and upper inner quadrants. These are in a segmental distribution extending from the periareolar region to the mid posterior aspect of the breast. These span an area measuring 7.3 x 5.7 x 3.0 cm. There are additional scattered punctate calcifications in the right breast, some with dependent layering. Mammographic images were processed with CAD. Targeted ultrasound is performed, showing a 1.1 cm cyst in the 4 o'clock position of the right breast, 3 cm from the nipple, corresponding to the mammographic mass. Ultrasound of the right axilla demonstrated normal appearing right axillary lymph nodes. In the 7 o'clock position of the left breast, a 7 x 6 x 4 mm oval, horizontally oriented, circumscribed, hypoechoic mass demonstrated with mildly increased through transmission of sound. No internal blood flow was seen with power Doppler. In the 9 o'clock position of the left breast, 8 cm from the nipple, a 9 x 6 x 3 mm similar-appearing mass demonstrated, corresponding to the mammographic mass. Ultrasound of the left axilla demonstrates 2 left axillary lymph nodes with borderline cortical thickening, 1 with a maximum cortical thickness of 4.0 mm and the other with a maximum cortical thickness of 3.8 mm. IMPRESSION: 1. 7.3 x 5.7 x 3.0 cm area of indeterminate calcifications in medial right breast, involving the lower inner and upper inner quadrants, these extend from the retroareolar region posteriorly, in a segmental distribution, suspicious for DCIS. 2. Two probably benign left breast masses and 2 probably benign left axillary lymph nodes with borderline cortical thickening. 3. Benign right breast cyst. RECOMMENDATION:  1. 3D stereotactic guided core needle biopsy of the anterior and posterior extents of the 7.3 cm area of right breast calcifications. This has been discussed with patient and scheduled at 8:30 a.m. on 02/13/2020 2. If the 3D stereotactic guided core needle biopsies on the right yield pathologies not requiring surgery, a six-month follow-up left breast and left axillary ultrasound would be recommended. If the 3D stereotactic guided core needle biopsies on the right yield pathologies requiring surgery, ultrasound-guided core needle biopsies of the 2 left breast masses and 1 of the borderline abnormal left axillary lymph nodes would be recommended at that time. This has also been discussed with the patient. 3. Per American Cancer Society guidelines, if the patient has a calculated lifetime risk of developing breast cancer of greater than 20%, annual screening MRI of the breasts would also be recommended. I have discussed the findings and recommendations with the patient. If applicable, a reminder letter will be sent to the patient regarding the next appointment. BI-RADS CATEGORY  4: Suspicious. Electronically Signed   By: Claudie Revering M.D.   On: 01/30/2020 16:51   US BREAST LTD UNI RIGHT INC AXILLA  Result Date: 01/30/2020 CLINICAL DATA:  Possible mass in the mid posteromedial left breast, possible mass in the mid posterior medial right breast and calcifications in the right breast a recent screening mammogram. Strong family history of breast cancer including her mother diagnosed in her 72s, a maternal aunt, maternal niece, paternal aunt and paternal grandmother. EXAM: DIGITAL DIAGNOSTIC BILATERAL MAMMOGRAM WITH CAD AND TOMO ULTRASOUND BILATERAL BREAST COMPARISON:  Previous exam(s). ACR Breast Density Category c: The breast tissue is heterogeneously dense, which may obscure small masses. FINDINGS: 3D tomographic and 2D generated spot compression images of both breasts were obtained as  well as 2D true lateral and  spot magnification views of the right breast. These confirm an oval, circumscribed mass in the mid to posterior medial left breast. There is also a rounded, circumscribed mass in the mid to posterior medial right breast. Also demonstrated are extensive, new calcifications in the right breast extending from the posterior to the anterior portion of the breast medially, involving the lower inner and upper inner quadrants. These are in a segmental distribution extending from the periareolar region to the mid posterior aspect of the breast. These span an area measuring 7.3 x 5.7 x 3.0 cm. There are additional scattered punctate calcifications in the right breast, some with dependent layering. Mammographic images were processed with CAD. Targeted ultrasound is performed, showing a 1.1 cm cyst in the 4 o'clock position of the right breast, 3 cm from the nipple, corresponding to the mammographic mass. Ultrasound of the right axilla demonstrated normal appearing right axillary lymph nodes. In the 7 o'clock position of the left breast, a 7 x 6 x 4 mm oval, horizontally oriented, circumscribed, hypoechoic mass demonstrated with mildly increased through transmission of sound. No internal blood flow was seen with power Doppler. In the 9 o'clock position of the left breast, 8 cm from the nipple, a 9 x 6 x 3 mm similar-appearing mass demonstrated, corresponding to the mammographic mass. Ultrasound of the left axilla demonstrates 2 left axillary lymph nodes with borderline cortical thickening, 1 with a maximum cortical thickness of 4.0 mm and the other with a maximum cortical thickness of 3.8 mm. IMPRESSION: 1. 7.3 x 5.7 x 3.0 cm area of indeterminate calcifications in medial right breast, involving the lower inner and upper inner quadrants, these extend from the retroareolar region posteriorly, in a segmental distribution, suspicious for DCIS. 2. Two probably benign left breast masses and 2 probably benign left axillary lymph  nodes with borderline cortical thickening. 3. Benign right breast cyst. RECOMMENDATION: 1. 3D stereotactic guided core needle biopsy of the anterior and posterior extents of the 7.3 cm area of right breast calcifications. This has been discussed with patient and scheduled at 8:30 a.m. on 02/13/2020 2. If the 3D stereotactic guided core needle biopsies on the right yield pathologies not requiring surgery, a six-month follow-up left breast and left axillary ultrasound would be recommended. If the 3D stereotactic guided core needle biopsies on the right yield pathologies requiring surgery, ultrasound-guided core needle biopsies of the 2 left breast masses and 1 of the borderline abnormal left axillary lymph nodes would be recommended at that time. This has also been discussed with the patient. 3. Per American Cancer Society guidelines, if the patient has a calculated lifetime risk of developing breast cancer of greater than 20%, annual screening MRI of the breasts would also be recommended. I have discussed the findings and recommendations with the patient. If applicable, a reminder letter will be sent to the patient regarding the next appointment. BI-RADS CATEGORY  4: Suspicious. Electronically Signed   By: Claudie Revering M.D.   On: 01/30/2020 16:51   MM DIAG BREAST TOMO BILATERAL  Result Date: 01/30/2020 CLINICAL DATA:  Possible mass in the mid posteromedial left breast, possible mass in the mid posterior medial right breast and calcifications in the right breast a recent screening mammogram. Strong family history of breast cancer including her mother diagnosed in her 64s, a maternal aunt, maternal niece, paternal aunt and paternal grandmother. EXAM: DIGITAL DIAGNOSTIC BILATERAL MAMMOGRAM WITH CAD AND TOMO ULTRASOUND BILATERAL BREAST COMPARISON:  Previous exam(s). ACR  Breast Density Category c: The breast tissue is heterogeneously dense, which may obscure small masses. FINDINGS: 3D tomographic and 2D generated  spot compression images of both breasts were obtained as well as 2D true lateral and spot magnification views of the right breast. These confirm an oval, circumscribed mass in the mid to posterior medial left breast. There is also a rounded, circumscribed mass in the mid to posterior medial right breast. Also demonstrated are extensive, new calcifications in the right breast extending from the posterior to the anterior portion of the breast medially, involving the lower inner and upper inner quadrants. These are in a segmental distribution extending from the periareolar region to the mid posterior aspect of the breast. These span an area measuring 7.3 x 5.7 x 3.0 cm. There are additional scattered punctate calcifications in the right breast, some with dependent layering. Mammographic images were processed with CAD. Targeted ultrasound is performed, showing a 1.1 cm cyst in the 4 o'clock position of the right breast, 3 cm from the nipple, corresponding to the mammographic mass. Ultrasound of the right axilla demonstrated normal appearing right axillary lymph nodes. In the 7 o'clock position of the left breast, a 7 x 6 x 4 mm oval, horizontally oriented, circumscribed, hypoechoic mass demonstrated with mildly increased through transmission of sound. No internal blood flow was seen with power Doppler. In the 9 o'clock position of the left breast, 8 cm from the nipple, a 9 x 6 x 3 mm similar-appearing mass demonstrated, corresponding to the mammographic mass. Ultrasound of the left axilla demonstrates 2 left axillary lymph nodes with borderline cortical thickening, 1 with a maximum cortical thickness of 4.0 mm and the other with a maximum cortical thickness of 3.8 mm. IMPRESSION: 1. 7.3 x 5.7 x 3.0 cm area of indeterminate calcifications in medial right breast, involving the lower inner and upper inner quadrants, these extend from the retroareolar region posteriorly, in a segmental distribution, suspicious for DCIS. 2.  Two probably benign left breast masses and 2 probably benign left axillary lymph nodes with borderline cortical thickening. 3. Benign right breast cyst. RECOMMENDATION: 1. 3D stereotactic guided core needle biopsy of the anterior and posterior extents of the 7.3 cm area of right breast calcifications. This has been discussed with patient and scheduled at 8:30 a.m. on 02/13/2020 2. If the 3D stereotactic guided core needle biopsies on the right yield pathologies not requiring surgery, a six-month follow-up left breast and left axillary ultrasound would be recommended. If the 3D stereotactic guided core needle biopsies on the right yield pathologies requiring surgery, ultrasound-guided core needle biopsies of the 2 left breast masses and 1 of the borderline abnormal left axillary lymph nodes would be recommended at that time. This has also been discussed with the patient. 3. Per American Cancer Society guidelines, if the patient has a calculated lifetime risk of developing breast cancer of greater than 20%, annual screening MRI of the breasts would also be recommended. I have discussed the findings and recommendations with the patient. If applicable, a reminder letter will be sent to the patient regarding the next appointment. BI-RADS CATEGORY  4: Suspicious. Electronically Signed   By: Claudie Revering M.D.   On: 01/30/2020 16:51   MM CLIP PLACEMENT RIGHT  Result Date: 02/13/2020 CLINICAL DATA:  Confirmation clip placement after stereotactic tomosynthesis core needle biopsy x 2 of segmental calcifications involving the INNER RIGHT breast. EXAM: 2D AND TOMOSYNTHESIS DIAGNOSTIC RIGHT MAMMOGRAM POST STEREOTACTIC BIOPSY COMPARISON:  Previous exam(s). FINDINGS: Tomosynthesis and synthesized full  field CC and mediolateral images were obtained following stereotactic tomosynthesis guided biopsy x 2 of segmental calcifications involving the INNER RIGHT breast. The coil shaped tissue marking clip is appropriately position at  the site of the biopsied calcifications at the POSTERIOR margin of the segmental group. The X shaped tissue marking clip is appropriately positioned at the site of the biopsied calcifications in the MID-ANTERIOR portion of the segmental group. The ANTERIOR extent of the calcifications were too superficial to safely biopsy. The most ANTERIOR calcifications in the UPPER INNER subareolar location are approximately 7 cm from the coil clip at the biopsied POSTERIOR extent of the calcifications. Expected post biopsy changes are present at both sites without evidence of hematoma. IMPRESSION: 1. Appropriate positioning of the coil shaped tissue marking clip at the site of the biopsied calcifications at the POSTERIOR extent of the 7 cm segmental group in the INNER RIGHT breast. 2. Appropriate positioning of the X shaped tissue marking clip at the site of the biopsied calcifications at the MID-ANTERIOR portion of the 7 cm segmental group in the INNER RIGHT breast. 3. The ANTERIOR extent of the segmental calcifications were too superficial to biopsy. The ANTERIOR calcifications in the UPPER INNER subareolar location are approximately 7 cm from the coil shaped clip. Final Assessment: Post Procedure Mammograms for Marker Placement Electronically Signed   By: Evangeline Dakin M.D.   On: 02/13/2020 09:51   MM RT BREAST BX W LOC DEV 1ST LESION IMAGE BX SPEC STEREO GUIDE  Addendum Date: 02/16/2020   ADDENDUM REPORT: 02/16/2020 15:04 ADDENDUM: Pathology revealed HIGH GRADE DUCTAL CARCINOMA IN SITU WITH CALCIFICATIONS AND NECROSIS of the RIGHT breast, posterior extent. This was found to be concordant by Dr. Peggye Fothergill. Pathology revealed GRADE III INVASIVE DUCTAL CARCINOMA, DUCTAL CARCINOMA IN SITU of the RIGHT breast, mid-anterior extent. This was found to be concordant by Dr. Peggye Fothergill. Pathology results were discussed with the patient by telephone. The patient reported doing well after the biopsies with tenderness at  the sites. Post biopsy instructions and care were reviewed and questions were answered. The patient was encouraged to call [facility] for any additional concerns. As discussed with the patient by Dr. Joneen Caraway at the time of the diagnostic mammogram and ultrasound, ultrasound-guided core needle biopsies of the 2 left breast masses and 1 of the borderline abnormal left axillary lymph nodes are recommended. Breast MRI is recommended due to bilateral breast calcifications. The left breast ultrasound biopsies are scheduled for February 27, 2020 at 7:30 AM at [facility]. Surgical consultation has been arranged with Dr. Autumn Messing at [facility] on February 24, 2020. Pathology results reported by Stacie Acres RN on 02/16/2020. Electronically Signed   By: Evangeline Dakin M.D.   On: 02/16/2020 15:04   Result Date: 02/16/2020 CLINICAL DATA:  44 year old with a strong family history of breast cancer who has a screening detected 7 cm group of segmental calcifications involving the INNER RIGHT breast to include the [place] and minimally the [place] (the [place] calcifications are in the immediate subareolar location). Biopsy of the ANTERIOR and POSTERIOR extent of the calcifications is performed. EXAM: RIGHT BREAST STEREOTACTIC CORE NEEDLE BIOPSY x 2 COMPARISON:  Previous exams. FINDINGS: The patient and I discussed the procedure of stereotactic-guided biopsy including benefits and alternatives. We discussed the high likelihood of a successful procedure. We discussed the risks of the procedure including infection, bleeding, tissue injury, clip migration,  and inadequate sampling. Informed written consent was given. The usual time out protocol was performed immediately prior to the procedure. # 1) Lesion quadrant: LOWER INNER QUADRANT. Using sterile technique with chlorhexidine as skin antisepsis, 1% lidocaine and 1% lidocaine  with epinephrine as local anesthetic, under stereotactic tomosynthesis guidance, a 9 gauge Brevera vacuum assisted device was used to perform core needle biopsy of the POSTERIOR extent of the segmental calcifications involving the INNER RIGHT breast using a medial approach. Specimen radiograph was performed showing calcifications in 4 of the core samples. Specimens with calcifications are identified for pathology. At the conclusion of the procedure, a coil shaped tissue marker clip was deployed into the biopsy cavity. # 2) Lesion quadrant: LOWER INNER QUADRANT. Using sterile technique with chlorhexidine as skin antisepsis, 1% lidocaine and 1% lidocaine with epinephrine as local anesthetic, under stereotactic tomosynthesis guidance, a 9 gauge Brevera vacuum assisted device was used perform core needle biopsy of the MIDDLE to ANTERIOR extent of the segmental calcifications involving the INNER RIGHT breast using a medial approach. Specimen radiograph was performed showing calcifications in at least 2 of the core samples. Specimens with calcifications are identified for pathology. At the conclusion of the procedure, an X shaped tissue marker clip was deployed into the biopsy cavity. Follow-up 2-view mammogram was performed and dictated separately. The most ANTERIOR calcifications directly behind the nipple and in the UPPER INNER subareolar location were too superficial to safely biopsy. IMPRESSION: Stereotactic-guided biopsy of segmental calcifications involving the INNER RIGHT breast x 2. No apparent complications. Electronically Signed: By: Evangeline Dakin M.D. On: 02/13/2020 09:52   MM RT BREAST BX W LOC DEV EA AD LESION IMG BX SPEC STEREO GUIDE  Addendum Date: 02/16/2020   ADDENDUM REPORT: 02/16/2020 15:04 ADDENDUM: Pathology revealed HIGH GRADE DUCTAL CARCINOMA IN SITU WITH CALCIFICATIONS AND NECROSIS of the RIGHT breast, posterior extent. This was found to be concordant by Dr. Peggye Fothergill. Pathology  revealed GRADE III INVASIVE DUCTAL CARCINOMA, DUCTAL CARCINOMA IN SITU of the RIGHT breast, mid-anterior extent. This was found to be concordant by Dr. Peggye Fothergill. Pathology results were discussed with the patient by telephone. The patient reported doing well after the biopsies with tenderness at the sites. Post biopsy instructions and care were reviewed and questions were answered. The patient was encouraged to call [facility] for any additional concerns. As discussed with the patient by Dr. Joneen Caraway at the time of the diagnostic mammogram and ultrasound, ultrasound-guided core needle biopsies of the 2 left breast masses and 1 of the borderline abnormal left axillary lymph nodes are recommended. Breast MRI is recommended due to bilateral breast calcifications. The left breast ultrasound biopsies are scheduled for February 27, 2020 at 7:30 AM at [facility]. Surgical consultation has been arranged with Dr. Autumn Messing at [facility] on February 24, 2020. Pathology results reported by Stacie Acres RN on 02/16/2020. Electronically Signed   By: Evangeline Dakin M.D.   On: 02/16/2020 15:04   Result Date: 02/16/2020 CLINICAL DATA:  44 year old with a strong family history of breast cancer who has a screening detected 7 cm group of segmental calcifications involving the INNER RIGHT breast to include the [place] and minimally the [place] (the [place] calcifications are in the immediate subareolar location). Biopsy of the ANTERIOR and POSTERIOR extent of the calcifications is performed. EXAM: RIGHT BREAST STEREOTACTIC CORE NEEDLE BIOPSY x 2 COMPARISON:  Previous exams. FINDINGS: The patient and  I discussed the procedure of stereotactic-guided biopsy including benefits and alternatives. We discussed the high likelihood of a successful procedure. We discussed the risks of the procedure including infection, bleeding,  tissue injury, clip migration, and inadequate sampling. Informed written consent was given. The usual time out protocol was performed immediately prior to the procedure. # 1) Lesion quadrant: LOWER INNER QUADRANT. Using sterile technique with chlorhexidine as skin antisepsis, 1% lidocaine and 1% lidocaine with epinephrine as local anesthetic, under stereotactic tomosynthesis guidance, a 9 gauge Brevera vacuum assisted device was used to perform core needle biopsy of the POSTERIOR extent of the segmental calcifications involving the INNER RIGHT breast using a medial approach. Specimen radiograph was performed showing calcifications in 4 of the core samples. Specimens with calcifications are identified for pathology. At the conclusion of the procedure, a coil shaped tissue marker clip was deployed into the biopsy cavity. # 2) Lesion quadrant: LOWER INNER QUADRANT. Using sterile technique with chlorhexidine as skin antisepsis, 1% lidocaine and 1% lidocaine with epinephrine as local anesthetic, under stereotactic tomosynthesis guidance, a 9 gauge Brevera vacuum assisted device was used perform core needle biopsy of the MIDDLE to ANTERIOR extent of the segmental calcifications involving the INNER RIGHT breast using a medial approach. Specimen radiograph was performed showing calcifications in at least 2 of the core samples. Specimens with calcifications are identified for pathology. At the conclusion of the procedure, an X shaped tissue marker clip was deployed into the biopsy cavity. Follow-up 2-view mammogram was performed and dictated separately. The most ANTERIOR calcifications directly behind the nipple and in the UPPER INNER subareolar location were too superficial to safely biopsy. IMPRESSION: Stereotactic-guided biopsy of segmental calcifications involving the INNER RIGHT breast x 2. No apparent complications. Electronically Signed: By: Evangeline Dakin M.D. On: 02/13/2020 09:52    ASSESSMENT & PLAN:  Tamya Denardo is a 44 y.o. African [place] female with   1. 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 Large 6.3cm (on MRI)-7.3cm (on [place]) mass with calcification in the right breast, and biopsy showed invasive ductal carcinoma and components of DCIS. The exact size of her invasive component is unknown (only 1 out of 2 biopsy showed IDC, the other biopsy is DCIS alone) -Her Mammogram also showed 2 masses in the left breast, but her 02/21/20 breast MRI showed no presence of malignancy in the left breast. She plans to proceed with left breast biopsy soon.  -Given the size of her mass, she was recommended to have Mastectomy with Sentinel LN biopsy by Dr Marlou Starks. She is agreeable and plans to proceed with surgery soon.   -I recommend a Oncotype Dx test on the surgical sample and we'll make a decision about adjuvant chemotherapy based on the Oncotype result. Written material of this test was given to her. She is young and fit, would be a good candidate for chemotherapy if her Oncotype recurrence score is high. -If her surgical sentinel lymph node positive, I recommend mammaprint for further risk stratification and guide adjuvant chemotherapy. -The risk of recurrence depends on the stage and biology of the tumor. She has ER/PR positive and HER2 negative disease. I discussed this is the more common type of slow growing tumor, likely low risk disease.  -She was also seen by radiation oncologist this week. If her surgical sentinel lymph nodes were negative, she would not need post mastectomy  radiation. -Given the strong ER and PR expression in her postmenopausal status, I recommend adjuvant endocrine therapy with Tamoxifen or ovarian suppression and aromatase inhibitor for a total of  5-10 years to reduce the risk of cancer recurrence. Potential benefits and  side effects were discussed with patient and she is interested. If high risk, I would strongly recommend ovarian suppression with AI -We also discussed the breast cancer surveillance after her surgery. She will continue annual screening mammogram, self exam, and a routine office visit with lab and exam with [place].   2. Family history, Genetic testing  -She has an extensive family history of cancer, mainly breast cancer on both sides of her family.  -Her Gyn recommended her for genetic testing 2 years ago, she declined. She is now open to proceeding with genetic testing since her breast cancer diagnosis. Will proceed with testing tomorrow.  -I discussed if she is found to have BRCA1/2 positive mutation, prophylactic b/l mastectomy would be recommended.    3. Pre-Diabetic, Stress -She is seen by Endocrinologist, not on medication  -She is seen by Therapist as needed, not on medication.    PLAN:  -Lab and Genetic counseling tomorrow.  -Per pt request, I referred her to Dietician to discuss health diet  -Proceed with surgery soon  -Oncotype (if node -) or mammaprint (if node+) on her surgical sample  -F/u after surgery or Radiation   Orders Placed This Encounter  Procedures   Ambulatory referral to Social Work    Referral Priority:   Routine    Referral Type:   Consultation    Referral Reason:   Specialty Services Required    Number of Visits Requested:   1   Ambulatory referral to Nutrition and Diabetic E    Referral Priority:   Routine    Referral Type:   Consultation    Referral Reason:   Specialty Services Required    Number of Visits Requested:   1    All questions were answered. The patient knows to call the clinic with any problems, questions or concerns. The total time spent in the appointment was 50 minutes.     [name], MD 02/25/2020   I, Joslyn Devon, am acting as scribe for [name], MD.   I have reviewed the above documentation for accuracy and completeness,  and I agree with the above.

## 2020-02-26 ENCOUNTER — Telehealth: Payer: Self-pay | Admitting: Hematology

## 2020-02-26 ENCOUNTER — Inpatient Hospital Stay: Payer: BC Managed Care – PPO

## 2020-02-26 ENCOUNTER — Encounter: Payer: Self-pay | Admitting: Genetic Counselor

## 2020-02-26 ENCOUNTER — Inpatient Hospital Stay (HOSPITAL_BASED_OUTPATIENT_CLINIC_OR_DEPARTMENT_OTHER): Payer: BC Managed Care – PPO | Admitting: Genetic Counselor

## 2020-02-26 ENCOUNTER — Other Ambulatory Visit: Payer: Self-pay

## 2020-02-26 ENCOUNTER — Encounter: Payer: Self-pay | Admitting: General Practice

## 2020-02-26 ENCOUNTER — Ambulatory Visit: Payer: BC Managed Care – PPO | Attending: General Surgery

## 2020-02-26 DIAGNOSIS — C50811 Malignant neoplasm of overlapping sites of right female breast: Secondary | ICD-10-CM

## 2020-02-26 DIAGNOSIS — Z803 Family history of malignant neoplasm of breast: Secondary | ICD-10-CM | POA: Diagnosis not present

## 2020-02-26 DIAGNOSIS — Z170 Estrogen receptor positive status [ER+]: Secondary | ICD-10-CM | POA: Insufficient documentation

## 2020-02-26 DIAGNOSIS — C50411 Malignant neoplasm of upper-outer quadrant of right female breast: Secondary | ICD-10-CM | POA: Diagnosis not present

## 2020-02-26 LAB — CBC WITH DIFFERENTIAL (CANCER CENTER ONLY)
Abs Immature Granulocytes: 0.02 10*3/uL (ref 0.00–0.07)
Basophils Absolute: 0.1 10*3/uL (ref 0.0–0.1)
Basophils Relative: 1 %
Eosinophils Absolute: 0.1 10*3/uL (ref 0.0–0.5)
Eosinophils Relative: 3 %
HCT: 34.4 % — ABNORMAL LOW (ref 36.0–46.0)
Hemoglobin: 11.8 g/dL — ABNORMAL LOW (ref 12.0–15.0)
Immature Granulocytes: 1 %
Lymphocytes Relative: 36 %
Lymphs Abs: 1.5 10*3/uL (ref 0.7–4.0)
MCH: 29.1 pg (ref 26.0–34.0)
MCHC: 34.3 g/dL (ref 30.0–36.0)
MCV: 84.9 fL (ref 80.0–100.0)
Monocytes Absolute: 0.3 10*3/uL (ref 0.1–1.0)
Monocytes Relative: 8 %
Neutro Abs: 2.2 10*3/uL (ref 1.7–7.7)
Neutrophils Relative %: 51 %
Platelet Count: 304 10*3/uL (ref 150–400)
RBC: 4.05 MIL/uL (ref 3.87–5.11)
RDW: 13.2 % (ref 11.5–15.5)
WBC Count: 4.2 10*3/uL (ref 4.0–10.5)
nRBC: 0 % (ref 0.0–0.2)

## 2020-02-26 LAB — CMP (CANCER CENTER ONLY)
ALT: 18 U/L (ref 0–44)
AST: 18 U/L (ref 15–41)
Albumin: 4 g/dL (ref 3.5–5.0)
Alkaline Phosphatase: 57 U/L (ref 38–126)
Anion gap: 8 (ref 5–15)
BUN: 13 mg/dL (ref 6–20)
CO2: 26 mmol/L (ref 22–32)
Calcium: 9.3 mg/dL (ref 8.9–10.3)
Chloride: 107 mmol/L (ref 98–111)
Creatinine: 1.03 mg/dL — ABNORMAL HIGH (ref 0.44–1.00)
GFR, Estimated: >60 mL/min (ref >60)
Glucose, Bld: 96 mg/dL (ref 70–99)
Potassium: 4.1 mmol/L (ref 3.5–5.1)
Sodium: 141 mmol/L (ref 135–145)
Total Bilirubin: 1.1 mg/dL (ref 0.3–1.2)
Total Protein: 7.6 g/dL (ref 6.5–8.1)

## 2020-02-26 NOTE — Progress Notes (Signed)
[facility] Psychosocial Distress Screening Clinical Social Work  Clinical Social Work was referred by distress screening protocol.  The patient scored a 6 on the Psychosocial Distress Thermometer which indicates moderate distress. Clinical Social Worker contacted patient by phone to assess for distress and other psychosocial needs. Patient was on a work call when CSW called, states she will call us back when convenient.  She has our contact information.  [facility] DISTRESS SCREENING 02/25/2020  Screening Type Initial Screening  Distress experienced in past week (1-10) 6  Practical problem type Work/school  Emotional problem type Adjusting to illness  Information Concerns Type Lack of info about diagnosis;Lack of info about treatment;Lack of info about maintaining fitness  Physician notified of physical symptoms No  Referral to clinical psychology No  Referral to clinical social work Yes  Referral to dietition No  Referral to financial advocate No  Referral to support programs No  Referral to palliative care No    Clinical Social Worker follow up needed: No.  Await patient call.   If yes, follow up plan:  Beverely Pace, [place], LCSW Clinical Social Worker Phone:  [phone]

## 2020-02-26 NOTE — Therapy (Signed)
[place], [place], [address] Phone: [phone]   Fax:  [phone]  Physical Therapy Treatment  Patient Details  Name: Gail Erickson MRN: [id number] Date of Birth: December 07, 1975 Referring Provider (PT): Dr. Marlou Starks   Encounter Date: 02/26/2020   PT End of Session - 02/26/20 1714    Visit Number 1    Number of Visits 2    Date for PT Re-Evaluation 04/08/20    PT Start Time 7408    PT Stop Time 1448    PT Time Calculation (min) 49 min           Past Medical History:  Diagnosis Date  . Abnormal Pap smear of cervix 06-02-14   LGSIL:neg HR HPV (1st abn. pap)  . Cyst of right breast    --stable in 2015 U/S done in [place], [place]  . Elevated DHEA ([place]) 05/21/15   level = 343  . Family history of breast cancer   . 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 Migraine without aura   . Prediabetes 2021   sees Dr.Balan  . Vitamin D deficiency 05/21/15   level = 10    Past Surgical History:  Procedure Laterality Date  . UMBILICAL HERNIA REPAIR     --as a child    There were no vitals filed for this visit.   Subjective Assessment - 02/26/20 1412    Subjective Here for Pre-op Screen    Pertinent History December 6th learned of Cancer from a biopsy done on Dec 3.  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 Did gene testing today, and having a biopsy on left breast tomorrow. Presently planning for right breast mastectomy but awaiting gene testing results.    Currently in Pain? No/denies              [facility] PT Assessment - 02/26/20 0001      Assessment   Referring Provider (PT) Dr. Marlou Starks    Hand Dominance Right      Precautions   Precaution Comments will have lymphedema precautions      Restrictions   Weight Bearing Restrictions No      Balance Screen   Has the patient fallen in the past 6 months No    Has the patient had a decrease in activity level because of a fear of falling?  No    Is the patient  reluctant to leave their home because of a fear of falling?  No      Home Social worker Private residence    Living Arrangements Alone    Available Help at Discharge Family      Prior Function   Level of Independence Independent    Vocation Full time employment    Vocation Requirements [employer]      Cognition   Overall Cognitive Status Within Functional Limits for tasks assessed      Observation/Other Assessments   Observations WNL      AROM   Right Shoulder Extension 65 Degrees    Right Shoulder Flexion 165 Degrees    Right Shoulder ABduction 179 Degrees    Right Shoulder Internal Rotation 70 Degrees    Right Shoulder External Rotation 70 Degrees    Left Shoulder Extension 71 Degrees    Left Shoulder Flexion 161 Degrees    Left Shoulder ABduction 180 Degrees    Left Shoulder Internal Rotation 68 Degrees    Left Shoulder External Rotation 78  Degrees             LYMPHEDEMA/ONCOLOGY QUESTIONNAIRE - 02/26/20 0001      Type   Cancer Type DCIS      Surgeries   Mastectomy Date --   unknown   Number Lymph Nodes Removed --   unknown     Treatment   Active Chemotherapy Treatment No    Past Chemotherapy Treatment No    Active Radiation Treatment No    Past Radiation Treatment No    Current Hormone Treatment No    Past Hormone Therapy No      What other symptoms do you have   Are you Having Heaviness or Tightness No    Are you having Pain No    Are you having pitting edema No    Is it Hard or Difficult finding clothes that fit --    Do you have infections --    Is there Decreased scar mobility --    Stemmer Sign --      Right Upper Extremity Lymphedema   15 cm Proximal to Olecranon Process 36 cm    10 cm Proximal to Olecranon Process 35 cm    Olecranon Process 28.9 cm    15 cm Proximal to Ulnar Styloid Process 26.8 cm    10 cm Proximal to Ulnar Styloid Process 23.3 cm    Just Proximal to Ulnar Styloid Process 17.3 cm    Across  Hand at [employer] 21.9 cm    At [place] of 2nd Digit 6.3 cm      Left Upper Extremity Lymphedema   15 cm Proximal to Olecranon Process 36.5 cm    10 cm Proximal to Olecranon Process 35.2 cm    Olecranon Process 28.6 cm    15 cm Proximal to Ulnar Styloid Process 26.3 cm    10 cm Proximal to Ulnar Styloid Process 23.2 cm    Just Proximal to Ulnar Styloid Process 17.1 cm    Across Hand at [employer] 22 cm    At [place] of 2nd Digit 6.4 cm           L-DEX FLOWSHEETS - 02/26/20 1400      L-DEX LYMPHEDEMA SCREENING   Measurement Type Unilateral    L-DEX MEASUREMENT EXTREMITY Upper Extremity    POSITION  Standing    DOMINANT SIDE Right    At Risk Side Right    BASELINE SCORE (UNILATERAL) -2.7                             PT Education - 02/26/20 1711    Education Details Pt was instructed in benefits of attending ABC class.  She was instructed in 4 post op exercises to begin as allowed by MD depending on her type of surgery    Person(s) Educated Patient    Methods Explanation;Handout    Comprehension Verbalized understanding;Returned demonstration                Breast Clinic Goals - 02/26/20 1604      Patient will be able to verbalize understanding of pertinent lymphedema risk reduction practices relevant to her diagnosis specifically related to skin care.   Period Weeks    Status New    Target Date 04/08/20      Patient will be able to return demonstrate and/or verbalize understanding of the post-op home exercise program related to regaining shoulder range of motion.  Time 6    Period Weeks    Status New    Target Date 04/08/20      Patient will be able to verbalize understanding of the importance of attending the postoperative After Breast Cancer Class for further lymphedema risk reduction education and therapeutic exercise.   Time 6    Period Weeks    Status New    Target Date 04/08/20      Additional Goals   Additional Goals Yes                 Plan - 02/26/20 1717    Clinical Impression Statement Pt was seen for baseline evaluation pre-surgery today.  She will be having a right Mastectomy but is awaiting an MRI of left breast and genetic testing to determine if she will require bilateral mastectomies.  She was given 4 post op exercises, ABC class information, the breast Cancer Book and was screened with LDex.  She will return for follow up after surgery    Stability/Clinical Decision Making Stable/Uncomplicated    Clinical Decision Making Low    Rehab Potential Excellent    PT Frequency 1x / week    PT Duration 6 weeks    PT Treatment/Interventions ADLs/Self Care Home Management;Therapeutic exercise;Patient/family education    PT Next Visit Plan REassess ROM and circumference post surgery, Schedule SOZO 3 months after surgery    PT Home Exercise Plan Exercises to start as allowed by MD depending on surgery    Consulted and Agree with Plan of Care Patient         Patient will follow up at outpatient cancer rehab 3-4 weeks following surgery.  If the patient requires physical therapy at that time, a specific plan will be dictated and sent to the referring physician for approval. The patient was educated today on appropriate basic range of motion exercises to begin post operatively and the importance of attending the After Breast Cancer class following surgery.  Patient was educated today on lymphedema risk reduction practices as it pertains to recommendations that will benefit the patient immediately following surgery.  She verbalized good understanding.   L-DEX LYMPHEDEMA SCREENING Measurement Type: Unilateral L-DEX MEASUREMENT EXTREMITY: Upper Extremity POSITION : Standing DOMINANT SIDE: Right At Risk Side: Right BASELINE SCORE (UNILATERAL): -2.7     Patient will benefit from skilled therapeutic intervention in order to improve the following deficits and impairments:  Decreased knowledge of  precautions,Postural dysfunction,Decreased safety awareness  Visit Diagnosis: Malignant neoplasm of upper-outer quadrant of right breast in female, estrogen receptor positive ([place])  Estrogen receptor positive     Problem List Patient Active Problem List   Diagnosis Date Noted  . Malignant neoplasm of overlapping sites of right breast ([place]) 02/25/2020  . Family history of breast cancer     Claris Pong 02/26/2020, 5:24 PM  [place], [place], [address] Phone: [phone]   Fax:  [phone]  Name: Gail Erickson MRN: [id number] Date of Birth: Feb 27, 1976  Cheral Almas, PT 02/26/20 5:26 PM

## 2020-02-26 NOTE — Telephone Encounter (Signed)
No 12/15 los. No changes made to pt's schedule.  °

## 2020-02-26 NOTE — Telephone Encounter (Signed)
Scheduled appt per 12/16 sch msg for nutritionist - pt is aware of appt date and time

## 2020-02-26 NOTE — Patient Instructions (Addendum)
Patient was instructed today in a home exercise program today for post op shoulder range of motion. These included active assist shoulder flexion in sitting, scapular retraction, wall walking with shoulder abduction, and hands behind head external rotation.  She was encouraged to do these twice a day, holding 3 seconds and repeating 5 times when permitted by her physician.   Physical Therapy Information for After Breast Cancer Surgery/Treatment:   Lymphedema is a swelling condition that you may be at risk for in your arm if you have lymph nodes removed from the armpit area.  After a sentinel node biopsy, the risk is approximately 5-9% and is higher after an axillary node dissection.  There is treatment available for this condition and it is not life-threatening.  Contact your physician or physical therapist with concerns.  You may begin the 4 shoulder/posture exercises (see additional sheet) when permitted by your physician (typically a week after surgery).  If you have drains, you may need to wait until those are removed before beginning range of motion exercises.  A general recommendation is to not lift your arms above shoulder height until drains are removed.  These exercises should be done to your tolerance and gently.  This is not a "no pain/no gain" type of recovery so listen to your body and stretch into the range of motion that you can tolerate, stopping if you have pain.  If you are having immediate reconstruction, ask your plastic surgeon about doing exercises as he or she may want you to wait.  We encourage you to attend the free one time ABC (After Breast Cancer) class offered by [facility].  You will learn information related to lymphedema risk, prevention and treatment and additional exercises to regain mobility following surgery.  You can call [phone] for more information.  This is offered the 1st and 3rd Monday of each month.  You only attend the class one  time.  While undergoing any medical procedure or treatment, try to avoid blood pressure being taken or needle sticks from occurring on the arm on the side of cancer.   This recommendation begins after surgery and continues for the rest of your life.  This may help reduce your risk of getting lymphedema (swelling in your arm).  An excellent resource for those seeking information on lymphedema is the National Lymphedema Network's web site. It can be accessed at www.lymphnet.org  If you notice swelling in your hand, arm or breast at any time following surgery (even if it is many years from now), please contact your doctor or physical therapist to discuss this.  Lymphedema can be treated at any time but it is easier for you if it is treated early on.  If you feel like your shoulder motion is not returning to normal in a reasonable amount of time, please contact your surgeon or physical therapist.  Marti C. Smith, PT, CLT [phone]; [address] [place], [place] [address] ABC CLASS After Breast Cancer Class  After Breast Cancer Class is a specially designed exercise class to assist you in a safe recover after having breast cancer surgery.  In this class you will learn how to get back to full function whether your drains were just removed or if you had surgery a month ago.  This one-time class is held the 1st and 3rd Monday of every month from 11:00 a.m. until 12:00 noon at the [facility] located at [address] [place], [place] [address]    This class is FREE and space is limited. For more information or to register for the next available class, call [phone].  Class Goals   Understand specific stretches to improve the flexibility of you chest and shoulder.  Learn ways to safely strengthen your upper body and improve your posture.  Understand the warning signs of infection and why you may be at risk for an arm infection.  Learn about Lymphedema and  prevention.  ** You do not attend this class until after surgery.  Drains must be removed to participate  Patient was instructed today in a home exercise program today for post op shoulder range of motion. These included active assist shoulder flexion in sitting, scapular retraction, wall walking with shoulder abduction, and hands behind head external rotation.  She was encouraged to do these twice a day, holding 3 seconds and repeating 5 times when permitted by her physician.     

## 2020-02-26 NOTE — Progress Notes (Signed)
REFERRING PROVIDER: [name], [address] [place],  [place] [address]  PRIMARY PROVIDER:  Pcp, No  PRIMARY REASON FOR VISIT:  1. Malignant neoplasm of overlapping sites of right breast (Gail Erickson)   2. Family history of breast cancer      HISTORY OF PRESENT ILLNESS:   Ms. Gail Erickson, a 44 y.o. female, was seen for a [place] cancer genetics consultation at the request of Dr. Marlou Starks due to a personal and family history of breast cancer.  Ms. Baldwin presents to clinic today to discuss the possibility of a hereditary predisposition to cancer, genetic testing, and to further clarify her future cancer risks, as well as potential cancer risks for family members.   In December 2021, at the age of 70, Ms. Gail Erickson was diagnosed with cancer of the right breast. The treatment plan includes another biopsy next week, and genetic testing before determining surgical decisions.      CANCER HISTORY:  Oncology History Overview Note  Cancer Staging Malignant neoplasm of overlapping sites of right breast [facility]) Staging form: Breast, AJCC 8th Edition - Clinical stage from 02/25/2020: Stage IIB (cT3, cN0, cM0, G3, ER+, PR+, HER2-) - Signed by [name], MD on 02/25/2020    Malignant neoplasm of overlapping sites of right breast ([place])  01/30/2020 Mammogram   Mammogram 01/30/20  IMPRESSION: 1. 7.3 x 5.7 x 3.0 cm area of indeterminate calcifications in medial right breast, involving the lower inner and upper inner quadrants, these extend from the retroareolar region posteriorly, in a segmental distribution, suspicious for DCIS. 2. Two probably benign left breast masses and 2 probably benign left axillary lymph nodes with borderline cortical thickening. 3. Benign right breast cyst.   02/13/2020 Initial Biopsy   Diagnosis 02/13/20 1. Breast, right, needle core biopsy, posterior extent - DUCTAL CARCINOMA IN SITU WITH CALCIFICATIONS AND NECROSIS - SEE COMMENT 2. Breast, right, needle core biopsy,  mid-anterior extent - INVASIVE DUCTAL CARCINOMA - DUCTAL CARCINOMA IN SITU - SEE COMMENT Microscopic Comment 1. Based on the biopsy, the ductal carcinoma in situ has a solid pattern, high nuclear grade and measures 0.5 cm in greatest linear extent. Prognostic markers (ER/PR) are pending and will be reported in an addendum. Dr. Jeannie Done reviewed the case and agrees with the above diagnosis. These results were called to [facility] on February 16, 2020. 2. Based on the biopsy, the carcinoma appears Nottingham grade 3 of 3 and measures 0.15 cm in greatest linear extent. Prognostic markers (ER/PR/ki-67/HER2) are pending and will be reported in an addendum.   02/13/2020 Receptors her2    1. PROGNOSTIC INDICATORS Results: IMMUNOHISTOCHEMICAL AND MORPHOMETRIC ANALYSIS PERFORMED MANUALLY Estrogen Receptor: >95%, POSITIVE, MODERATE-STRONG STAINING INTENSITY Progesterone Receptor: 5%, POSITIVE, MODERATE-STRONG STAINING INTENSITY  2. PROGNOSTIC INDICATORS Results: IMMUNOHISTOCHEMICAL AND MORPHOMETRIC ANALYSIS PERFORMED MANUALLY The tumor cells are NEGATIVE for Her2 (0). Estrogen Receptor: >95%, POSITIVE, STRONG STAINING INTENSITY Progesterone Receptor: 60%, POSITIVE, STRONG STAINING INTENSITY Proliferation Marker Ki67: 1%   02/21/2020 Breast MRI   Breast MRI 02/21/20  IMPRESSION: 1. Involving the sites of biopsy proven DCIS in the medial right breast there is a large area of ill-defined non mass enhancement middle to anterior depth, somewhat difficult to measure due to irregular margins and background enhancement but measures at least 6.3 x 3.3 x 2.0 cm. 2. No MRI evidence of malignancy in the left breast. 3. No axillary adenopathy.   02/25/2020 Initial Diagnosis   Malignant neoplasm of overlapping sites of right breast ([place])  02/25/2020 Cancer Staging   Staging form: Breast, AJCC 8th Edition - Clinical stage from 02/25/2020: Stage IIB (cT3, cN0, cM0, G3, ER+, PR+,  HER2-) - Signed by [name], MD on 02/25/2020      RISK FACTORS:  Menarche was at age 39-13.  First live birth at age N/A.  OCP use for approximately 0 years.  Ovaries intact: yes.  Hysterectomy: no.  Menopausal status: premenopausal.  HRT use: 0 years. Colonoscopy: no; not examined. Mammogram within the last year: yes. Number of breast biopsies: 1. Up to date with pelvic exams: yes. Any excessive radiation exposure in the past: no  Past Medical History:  Diagnosis Date  . Abnormal Pap smear of cervix 06-02-14   LGSIL:neg HR HPV (1st abn. pap)  . Cyst of right breast    --stable in 2015 U/S done in [place], [place]  . Elevated DHEA ([place]) 05/21/15   level = 343  . Family history of breast cancer   . 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 Migraine without aura   . Prediabetes 2021   sees Dr.Balan  . Vitamin D deficiency 05/21/15   level = 10    Past Surgical History:  Procedure Laterality Date  . UMBILICAL HERNIA REPAIR     --as a child    Social History   Socioeconomic History  . Marital status: Single    Spouse name: Not on file  . Number of children: 0  . Years of education: Not on file  . Highest education level: Not on file  Occupational History  . Not on file  Tobacco Use  . Smoking status: Never Smoker  . Smokeless tobacco: Never Used  Vaping Use  . Vaping Use: Never used  Substance and Sexual Activity  . Alcohol use: Yes    Alcohol/week: 0.0 standard drinks    Comment: 1 drink per month  . Drug use: No  . Sexual activity: Yes    Partners: Male    Birth control/protection: Condom    Comment: condoms everytime  Other Topics Concern  . Not on file  Social History Narrative  . Not on file   Social Determinants of Health   Financial Resource Strain: Not on file  Food Insecurity: Not on file  Transportation Needs: Not on file  Physical Activity: Not on file  Stress: Not on file  Social Connections: Not on file     FAMILY  HISTORY:  We obtained a detailed, 4-generation family history.  Significant diagnoses are listed below: Family History  Problem Relation Age of Onset  . Breast cancer Mother 87  . Diabetes Mother   . Hypertension Mother   . Hyperlipidemia Mother   . Thyroid disease Mother   . Transient ischemic attack Mother   . Breast cancer Paternal Aunt 30       bil. Breast ca--deceased CHF  . Breast cancer Cousin 45       maternal first cousin died Breast ca age 70  . Hyperlipidemia Father   . HIV/AIDS Maternal Uncle   . Cancer Paternal Uncle        NOS - needed BMT  . Cancer Maternal Grandmother 68       adrenal gland cancer  . Breast cancer Paternal Grandmother        dx in her mid to late 56s  . Aneurysm Maternal Aunt 26       Brain  . Kidney disease Maternal Uncle   . Cancer  Other        2 maternal great uncles with cancer NOS  . Breast cancer Maternal Aunt 72       Breast cancer  . Breast cancer Maternal Aunt 72    The patient does not have children.  She has one brother who is healthy and cancer free.  Both parents are living.  Her mother was diagnosed with breast cancer at age 63.  Her mother had three brothers and three sisters.  One sister died of a brain aneurysm at age 73, one sister was diagnosed last year with breast cancer at 44, one brother died of HIV/AIDS and another brother died of kidney disease.  This last brother had a daughter with breast cancer diagnosed in her early 63's and died at age 61.  Ms. Lawhorn maternal grandmother was diagnosed with adrenal gland cancer and died at 93.  She had two brothers who had a Cancer NOS and a sister who had a daughter with rectal cancer.    Ms. Lerner father had two brothers and a sister.  His sister died of bilateral breast cancer, which was diagnosed at 45.  One brother had a cancer NOS in which he has needed a bone marrow transplant.  Her paternal grandmother also had breast cancer and died in her late 48's.    Patient's maternal  ancestors are of Caucasian and African American descent, and paternal ancestors are of African American descent. There is no reported Ashkenazi Jewish ancestry. There is no known consanguinity.  GENETIC COUNSELING ASSESSMENT: Ms. Hetz is a 44 y.o. female with a personal and family history of breast cancer which is somewhat suggestive of a hereditary breast cancer syndrome and predisposition to cancer given the number of women with breast cancer, on both sides of the family, as well as young ages of onset. We, therefore, discussed and recommended the following at today's visit.   DISCUSSION: We discussed that 5 - 10% of breast cancer is hereditary, with most cases associated with BRCA mutations.  There are other genes that can be associated with hereditary breast cancer syndromes.  These include ATM, CHEK2 and PALB2. Based on the patient's family history, she meets testing criteria on both sides of her family, and therefore has bilineal risk for breast cancer.  We reviewed that there could be more than one pathogenic variant identified based on this bilineal risk.  We discussed that testing is beneficial for several reasons including knowing how to follow individuals after completing their treatment, identifying whether potential treatment options such as PARP inhibitors would be beneficial, and understand if other family members could be at risk for cancer and allow them to undergo genetic testing.   We reviewed the characteristics, features and inheritance patterns of hereditary cancer syndromes. We also discussed genetic testing, including the appropriate family members to test, the process of testing, insurance coverage and turn-around-time for results. We discussed the implications of a negative, positive and/or variant of uncertain significant result. In order to get genetic test results in a timely manner so that Ms. Stahl can use these genetic test results for surgical decisions, we recommended Ms.  Kimble pursue genetic testing for the BRCAPlus. Once complete, we recommend Ms. Brunner pursue reflex genetic testing to the CancerNext-Expanded+RNAinsight gene panel. The CancerNext-Expanded gene panel offered by [facility] and includes sequencing and rearrangement analysis for the following 77 genes: AIP, ALK, APC*, ATM*, AXIN2, BAP1, BARD1, BLM, BMPR1A, BRCA1*, BRCA2*, BRIP1*, CDC73, CDH1*, CDK4, CDKN1B, CDKN2A, CHEK2*, CTNNA1, DICER1, FANCC,  FH, FLCN, GALNT12, KIF1B, LZTR1, MAX, MEN1, MET, MLH1*, MSH2*, MSH3, MSH6*, MUTYH*, NBN, NF1*, NF2, NTHL1, PALB2*, PHOX2B, PMS2*, POT1, PRKAR1A, PTCH1, PTEN*, RAD51C*, RAD51D*, RB1, RECQL, RET, SDHA, SDHAF2, SDHB, SDHC, SDHD, SMAD4, SMARCA4, SMARCB1, SMARCE1, STK11, SUFU, TMEM127, TP53*, TSC1, TSC2, VHL and XRCC2 (sequencing and deletion/duplication); EGFR, EGLN1, HOXB13, KIT, MITF, PDGFRA, POLD1, and POLE (sequencing only); EPCAM and GREM1 (deletion/duplication only). DNA and RNA analyses performed for * genes.   Based on Ms. Markov's personal and family history of cancer, she meets medical criteria for genetic testing. Despite that she meets criteria, she may still have an out of pocket cost.   PLAN: After considering the risks, benefits, and limitations, Ms. Brumm provided informed consent to pursue genetic testing and the blood sample was sent to [facility] for analysis of the CancerNext-Expanded+RNAinsight. Results should be available within approximately 2-3 weeks' time, at which point they will be disclosed by telephone to Ms. Bergquist, as will any additional recommendations warranted by these results. Ms. Bently will receive a summary of her genetic counseling visit and a copy of her results once available. This information will also be available in Epic.   Ms. Oberman questions were answered to her satisfaction today. Our contact information was provided should additional questions or concerns arise. Thank you for the referral and allowing [place] to share in  the care of your patient.   Breona P. Florene Glen, [place], [facility] Licensed, Insurance risk surveyor [email or web address] phone: [phone]  The patient was seen for a total of 30 minutes in face-to-face genetic counseling.  This patient was discussed with Drs. Magrinat, Lindi Adie and/or Burr Medico who agrees with the above.    _______________________________________________________________________ For Office Staff:  Number of people involved in session: 1 Was an Intern/ student involved with case: no

## 2020-02-27 ENCOUNTER — Other Ambulatory Visit: Payer: BC Managed Care – PPO

## 2020-02-27 ENCOUNTER — Ambulatory Visit
Admission: RE | Admit: 2020-02-27 | Discharge: 2020-02-27 | Disposition: A | Discharge status: Home / Self Care | Payer: BC Managed Care – PPO | Source: Ambulatory Visit | Attending: Obstetrics and Gynecology | Admitting: Obstetrics and Gynecology

## 2020-02-27 ENCOUNTER — Other Ambulatory Visit: Payer: Self-pay | Admitting: Obstetrics and Gynecology

## 2020-02-27 DIAGNOSIS — R928 Other abnormal and inconclusive findings on diagnostic imaging of breast: Secondary | ICD-10-CM

## 2020-02-27 DIAGNOSIS — N6324 Unspecified lump in the left breast, lower inner quadrant: Secondary | ICD-10-CM | POA: Diagnosis not present

## 2020-02-27 DIAGNOSIS — R590 Localized enlarged lymph nodes: Secondary | ICD-10-CM | POA: Diagnosis not present

## 2020-02-27 DIAGNOSIS — N6325 Unspecified lump in the left breast, overlapping quadrants: Secondary | ICD-10-CM | POA: Diagnosis not present

## 2020-02-27 DIAGNOSIS — I899 Noninfective disorder of lymphatic vessels and lymph nodes, unspecified: Secondary | ICD-10-CM | POA: Diagnosis not present

## 2020-02-28 ENCOUNTER — Encounter: Payer: Self-pay | Admitting: Hematology

## 2020-03-01 ENCOUNTER — Other Ambulatory Visit: Payer: Self-pay

## 2020-03-01 ENCOUNTER — Telehealth: Payer: Self-pay | Admitting: *Deleted

## 2020-03-01 ENCOUNTER — Encounter: Payer: Self-pay | Admitting: *Deleted

## 2020-03-01 ENCOUNTER — Ambulatory Visit: Payer: BC Managed Care – PPO | Admitting: Plastic Surgery

## 2020-03-01 ENCOUNTER — Encounter: Payer: Self-pay | Admitting: Plastic Surgery

## 2020-03-01 VITALS — BP 113/75 | HR 66 | Temp 98.2°F | Ht 69.0 in | Wt 220.6 lb

## 2020-03-01 DIAGNOSIS — C50811 Malignant neoplasm of overlapping sites of right female breast: Secondary | ICD-10-CM

## 2020-03-01 NOTE — Telephone Encounter (Signed)
Spoke to pt, provided navigation resources and contact information. Discussed treatment plan. Pt waiting on genetic results to determine final surgical plan. Encourage pt to call with questions or needs. Received verbal understanding.

## 2020-03-01 NOTE — Progress Notes (Addendum)
Patient ID: Gail Erickson, female    DOB: 1975/07/27, 44 y.o.   MRN: [id number]   Chief Complaint  Patient presents with   Breast Cancer    The patient is a 44 year old female here for a consultation for breast reconstruction.  She was referred by Dr. Marlou Starks.  The patient has a history of cystic breasts first noted when she was 89.  As a result she has been diligent about mammograms.  This was her first abnormal mammogram done in 11/21.  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  It is estrogen and progesterone positive with Ki-67 1% and HER-2 negative.   Her past medical history is positive for migraines and prediabetes.  Her last hemoglobin A1c was 5.2.  Her mother had breast cancer at the age of 60 as well as her maternal aunt and a paternal aunt. She is 5 feet 9 inches tall and weighs 220 pounds.  Her preoperative bra size is a 40 D cup.  She has grade 2 ptosis of both breasts.  She has a little bit of swelling today from the biopsies.  She is not a smoker.  She has a past medical history positive for prediabetes with her last hemoglobin A1c 5.2.  Dr. Marlou Starks has recommended a mastectomy with sentinel lymph node biopsy.  She is waiting on the genetic results before she makes her final decision.   Review of Systems  Constitutional: Negative for activity change and appetite change.  HENT: Negative.   Eyes: Negative.   Respiratory: Negative.  Negative for chest tightness and shortness of breath.   Cardiovascular: Negative.   Gastrointestinal: Negative.   Endocrine: Negative.   Genitourinary: Negative.   Musculoskeletal: Negative.   Skin: Negative for color change and wound.  Hematological: Negative.   Psychiatric/Behavioral: Negative.     Past Medical History:  Diagnosis Date   Abnormal Pap smear of cervix 06-02-14   LGSIL:neg HR HPV (1st abn. 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   Migraine without aura    Prediabetes 2021   sees Dr.Balan   Vitamin D deficiency 05/21/15   level = 10    Past Surgical History:  Procedure Laterality Date   UMBILICAL HERNIA REPAIR     --as a child      Current Outpatient Medications:    Ascorbic Acid (VITAMIN C) 1000 MG tablet, Take 1,000 mg by mouth daily., Disp: , Rfl:    Aspirin-Acetaminophen-Caffeine (EXCEDRIN MIGRAINE PO), Take 1 tablet by mouth as needed., Disp: , Rfl:    Cholecalciferol (VITAMIN D3) 125 MCG (5000 UT) CAPS, Take 1 capsule by mouth daily., Disp: , Rfl:    Multiple Vitamin (MULTIVITAMIN) tablet, Take 1 tablet by mouth daily., Disp: , Rfl:    Probiotic Product (PROBIOTIC-10 PO), Take 1 tablet by mouth., Disp: , Rfl:    pseudoephedrine (SUDAFED) 30 MG tablet, Take 30 mg by mouth every 4 (four) hours as needed for congestion., Disp: , Rfl:    Objective:   Vitals:   03/01/20 1032  BP: 113/75  Pulse: 66  Temp: 98.2 F (36.8 C)  SpO2: 100%    Physical  Exam Vitals and nursing note reviewed.  Constitutional:      Appearance: Normal appearance.  HENT:     Head: Normocephalic and atraumatic.  Cardiovascular:     Rate and Rhythm: Normal rate.     Pulses: Normal pulses.  Pulmonary:     Effort: Pulmonary effort is normal.  Abdominal:     General: Abdomen is flat. There is no distension.     Tenderness: There is no abdominal tenderness.  Skin:    General: Skin is warm.     Capillary Refill: Capillary refill takes less than 2 seconds.     Coloration: Skin is not jaundiced.  Neurological:     General: No focal deficit present.     Mental Status: She is alert and oriented to person, place, and time. Mental status is at baseline.  Psychiatric:        Mood and Affect: Mood normal.        Behavior: Behavior  normal.        Thought Content: Thought content normal.     Assessment & Plan:  Malignant neoplasm of overlapping sites of right breast [facility])  We had a detailed conversation about the patients options for breast reconstruction. Several reconstruction options were explained to the patient.  It is important to remember that breast reconstruction is an optional procedure. Reconstruction often requires several stages of surgery and this means more than one operation.  The surgeries are often done several months apart.  The entire process from start to finish can take a year or more. The major goal of breast reconstruction is to look normal in clothing. There will always be scars and a difference noticeable without clothes.  This is true for asymmetries where both breasts will not be identical.  Surgery may be needed or desired to the non-cancerous breast in order to achieve better symmetry and satisfactory results.  Regardless of the reconstructive method, there is always risks and the possibility that the procedure will fail or have complications.  This could required additional surgeries.    We discussed the available methods of breast reconstruction and included:  1. Tissue expander with Acellular dermal matrix followed by implant based reconstruction. This can be done as one surgery or multiple surgeries.  2. Autologous reconstruction can include using a muscle or tissue from another area of the body for the reconstruction.  3. Combined procedures like the latissismus dorsi flaps that often uses the muscle with an expander or implant.  For each of the method discussed the risks, benefits, scars and recovery time were discussed in detail. Specific risks included bleeding, infection, hematoma, seroma, scarring, pain, wound healing complications, flap loss, fat necrosis, capsular contracture, need for implant removal, donor site complications, bulge, hernia, umbilical necrosis, need for urgent  reoperation, and need for dressing changes.   After the options were discussed we focused on the patient's desires and the procedure that was best for her based on all the information.  A total of 45 minutes of face-to-face time was spent in this encounter, of which >60% was spent in counseling.   We discussed the options as listed above.  She is most likely than ago with implant-based reconstruction of the right breast with expander.  She would then have expansion over 3 months and then the implant placed and a left mastopexy.  This is if her genetics is negative.  If her genetics is positive she will most likely go with bilateral mastectomies.  She still not 100% sure  if she wants mastectomy versus partial mastectomy.  We will do a telemetry visit in 2 weeks to discuss further because her genetic results should be back by that time.  I have reviewed her chart personally and have confirmed the information is correct with the patient.  I also have a call placed to Dr. Marlou Starks to discuss.  Given the size and location with the proximity to the NAC it is best for her to have a mastectomy and not NAC conserving treatment.    Pictures were obtained of the patient and placed in the chart with the patient's or guardian's permission.   [place], DO

## 2020-03-02 ENCOUNTER — Telehealth: Payer: Self-pay

## 2020-03-02 NOTE — Telephone Encounter (Addendum)
Call to pt regarding her questions about possible nipple/areola tattoo following breast reconstruction.  She has met with Dr.Toth & Dr. Marla Roe & she is awaiting genetic results for her & her physicians to decide on the next plan of care/surgery.   I informed her of the process of nipple tattooing & that this will be an option after successful reconstruction is complete. We discussed the need for more than one session with the tattoo- because of the touch-up process to make the NAC as realistic as possible. Pt understands the option for nipple/areola tattoo following reconstruction. I reminded her to call for any further questions & if she would like to come by the office to look at the tattoo machine &/or photos she can call me.

## 2020-03-08 ENCOUNTER — Encounter: Payer: Self-pay | Admitting: Genetic Counselor

## 2020-03-08 ENCOUNTER — Telehealth: Payer: Self-pay | Admitting: Genetic Counselor

## 2020-03-08 DIAGNOSIS — Z1379 Encounter for other screening for genetic and chromosomal anomalies: Secondary | ICD-10-CM | POA: Insufficient documentation

## 2020-03-08 NOTE — Telephone Encounter (Signed)
Revealed negative genetic testing on the BRCAPlus STAT panel.  The remainder of the panel will be back around 03/20/2020.

## 2020-03-10 ENCOUNTER — Other Ambulatory Visit: Payer: BC Managed Care – PPO

## 2020-03-10 ENCOUNTER — Telehealth: Payer: Self-pay | Admitting: Genetic Counselor

## 2020-03-10 ENCOUNTER — Ambulatory Visit: Payer: Self-pay | Admitting: Genetic Counselor

## 2020-03-10 DIAGNOSIS — C50811 Malignant neoplasm of overlapping sites of right female breast: Secondary | ICD-10-CM

## 2020-03-10 NOTE — Telephone Encounter (Signed)
Revealed negative genetic testing.  Discussed that we do not know why she has breast cancer or why there is cancer in the family. It could be due to a different gene that we are not testing, or maybe our current technology may not be able to pick something up.  It will be important for her to keep in contact with genetics to keep up with whether additional testing may be needed. 

## 2020-03-10 NOTE — Progress Notes (Signed)
HPI:  Ms. Lobdell was previously seen in the [place] clinic due to a personal and family history of cancer and concerns regarding a hereditary predisposition to cancer. Please refer to our prior cancer genetics clinic note for more information regarding our discussion, assessment and recommendations, at the time. Ms. Moulin recent genetic test results were disclosed to her, as were recommendations warranted by these results. These results and recommendations are discussed in more detail below.  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 7.3 x 5.7 x 3.0 cm area of indeterminate calcifications in medial right breast, involving the lower inner and upper inner quadrants, these extend from the retroareolar region posteriorly, in a segmental distribution, suspicious for DCIS. 2. Two probably benign left breast masses and 2 probably benign left axillary lymph nodes with borderline cortical thickening. 3. Benign right breast cyst.   02/13/2020 Initial Biopsy   Diagnosis 02/13/20 1. Breast, right, needle core biopsy, posterior extent - DUCTAL CARCINOMA IN SITU WITH CALCIFICATIONS AND NECROSIS - SEE COMMENT 2. Breast, right, needle core biopsy, mid-anterior extent - INVASIVE DUCTAL CARCINOMA - DUCTAL CARCINOMA IN SITU - SEE COMMENT Microscopic Comment 1. Based on the biopsy, the ductal carcinoma in situ has a solid pattern, high nuclear grade and measures 0.5 cm in greatest linear extent. Prognostic markers (ER/PR) are pending and will be reported in an addendum. Dr. Jeannie Done reviewed the case and agrees with the  above diagnosis. These results were called to [facility] on February 16, 2020. 2. Based on the biopsy, the carcinoma appears Nottingham grade 3 of 3 and measures 0.15 cm in greatest linear extent. Prognostic markers (ER/PR/ki-67/HER2) are pending and will be reported in an addendum.   02/13/2020 Receptors her2    1. PROGNOSTIC INDICATORS Results: IMMUNOHISTOCHEMICAL AND MORPHOMETRIC ANALYSIS PERFORMED MANUALLY Estrogen Receptor: >95%, POSITIVE, MODERATE-STRONG STAINING INTENSITY Progesterone Receptor: 5%, POSITIVE, MODERATE-STRONG STAINING INTENSITY  2. PROGNOSTIC INDICATORS Results: IMMUNOHISTOCHEMICAL AND MORPHOMETRIC ANALYSIS PERFORMED MANUALLY The tumor cells are NEGATIVE for Her2 (0). Estrogen Receptor: >95%, POSITIVE, STRONG STAINING INTENSITY Progesterone Receptor: 60%, POSITIVE, STRONG STAINING INTENSITY Proliferation Marker Ki67: 1%   02/21/2020 Breast MRI   Breast MRI 02/21/20  IMPRESSION: 1. Involving the sites of biopsy proven DCIS in the medial right breast there is a large area of ill-defined non mass enhancement middle to anterior depth, somewhat difficult to measure due to irregular margins and background enhancement but measures at least 6.3 x 3.3 x 2.0 cm. 2. No MRI evidence of malignancy in the left breast. 3. No axillary adenopathy.   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  Significant diagnoses are listed below: Family History  Problem Relation Age of Onset  . Breast cancer Mother 33  . Diabetes Mother   . Hypertension Mother   . Hyperlipidemia Mother   .  Thyroid disease Mother   . Transient ischemic attack Mother    . Breast cancer Paternal Aunt 60       bil. Breast ca--deceased CHF  . Breast cancer Cousin 26       maternal first cousin died Breast ca age 17  . Hyperlipidemia Father   . HIV/AIDS Maternal Uncle   . Cancer Paternal Uncle        NOS - needed BMT  . Cancer Maternal Grandmother 34       adrenal gland cancer  . Breast cancer Paternal Grandmother        dx in her mid to late 51s  . Aneurysm Maternal Aunt 26       Brain  . Kidney disease Maternal Uncle   . Cancer Other        2 maternal great uncles with cancer NOS  . Breast cancer Maternal Aunt 72       Breast cancer  . Breast cancer Maternal Aunt 72    The patient does not have children.  She has one brother who is healthy and cancer free. Both parents are living.  Her mother was diagnosed with breast cancer at age 67. Her mother had three brothers and three sisters. One sister died of a brain aneurysm at age 66, one sister was diagnosed last year with breast cancer at 4, one brother died of HIV/AIDS and another brother died of kidney disease. This last brother had a daughter with breast cancer diagnosed in her early 62's and died at age 42. Ms. Craker maternal grandmother was diagnosed with adrenal gland cancer and died at 37. She had two brothers who had a Cancer NOS and a sister who had a daughter with rectal cancer.   Ms. Zaucha father had two brothers and a sister. His sister died of bilateral breast cancer, which was diagnosed at 60. One brother had a cancer NOS in which he has needed a bone marrow transplant. Her paternal grandmother also had breast cancer and died in her late 48's.   Patient's maternal ancestors are of Caucasian and African Americandescent, and paternal ancestors are of African Americandescent. There is noreported Ashkenazi Jewish ancestry. There is noknown consanguinity.   GENETIC TEST RESULTS: Genetic testing reported out on March 10, 2020 through the CancerNext-Expanded+RNAinsight  cancer panel found no pathogenic mutations. The CancerNext-Expanded gene panel offered by [facility] and includes sequencing and rearrangement analysis for the following 77 genes: AIP, ALK, APC*, ATM*, AXIN2, BAP1, BARD1, BLM, BMPR1A, BRCA1*, BRCA2*, BRIP1*, CDC73, CDH1*, CDK4, CDKN1B, CDKN2A, CHEK2*, CTNNA1, DICER1, FANCC, FH, FLCN, GALNT12, KIF1B, LZTR1, MAX, MEN1, MET, MLH1*, MSH2*, MSH3, MSH6*, MUTYH*, NBN, NF1*, NF2, NTHL1, PALB2*, PHOX2B, PMS2*, POT1, PRKAR1A, PTCH1, PTEN*, RAD51C*, RAD51D*, RB1, RECQL, RET, SDHA, SDHAF2, SDHB, SDHC, SDHD, SMAD4, SMARCA4, SMARCB1, SMARCE1, STK11, SUFU, TMEM127, TP53*, TSC1, TSC2, VHL and XRCC2 (sequencing and deletion/duplication); EGFR, EGLN1, HOXB13, KIT, MITF, PDGFRA, POLD1, and POLE (sequencing only); EPCAM and GREM1 (deletion/duplication only). DNA and RNA analyses performed for * genes. The test report has been scanned into EPIC and is located under the Molecular Pathology section of the Results Review tab.  A portion of the result report is included below for reference.     We discussed with Ms. Rochette that because current genetic testing is not perfect, it is possible there may be a gene mutation in one of these genes that current testing cannot detect, but that chance is small.  We also discussed, that there could be another gene that  has not yet been discovered, or that we have not yet tested, that is responsible for the cancer diagnoses in the family. It is also possible there is a hereditary cause for the cancer in the family that Ms. Oloughlin did not inherit and therefore was not identified in her testing.  Therefore, it is important to remain in touch with cancer genetics in the future so that we can continue to offer Ms. Modesto the most up to date genetic testing.   ADDITIONAL GENETIC TESTING: We discussed with Ms. Zakarian that her genetic testing was fairly extensive.  If there are genes identified to increase cancer risk that can be analyzed in the future,  we would be happy to discuss and coordinate this testing at that time.    CANCER SCREENING RECOMMENDATIONS: Ms. Herbig test result is considered negative (normal).  This means that we have not identified a hereditary cause for her personal and family history of breast cancer at this time. Most cancers happen by chance and this negative test suggests that her cancer may fall into this category.    While reassuring, this does not definitively rule out a hereditary predisposition to cancer. It is still possible that there could be genetic mutations that are undetectable by current technology. There could be genetic mutations in genes that have not been tested or identified to increase cancer risk.  Therefore, it is recommended she continue to follow the cancer management and screening guidelines provided by her oncology and primary healthcare provider.   An individual's cancer risk and medical management are not determined by genetic test results alone. Overall cancer risk assessment incorporates additional factors, including personal medical history, family history, and any available genetic information that may result in a personalized plan for cancer prevention and surveillance  RECOMMENDATIONS FOR FAMILY MEMBERS:  Individuals in this family might be at some increased risk of developing cancer, over the general population risk, simply due to the family history of cancer.  We recommended women in this family have a yearly mammogram beginning at age 76, or 57 years younger than the earliest onset of cancer, an annual clinical breast exam, and perform monthly breast self-exams. Women in this family should also have a gynecological exam as recommended by their primary provider. All family members should be referred for colonoscopy starting at age 17.  FOLLOW-UP: Lastly, we discussed with Ms. Entwistle that cancer genetics is a rapidly advancing field and it is possible that new genetic tests will be appropriate  for her and/or her family members in the future. We encouraged her to remain in contact with cancer genetics on an annual basis so we can update her personal and family histories and let her know of advances in cancer genetics that may benefit this family.   Our contact number was provided. Ms. Vahey questions were answered to her satisfaction, and she knows she is welcome to call us at anytime with additional questions or concerns.   Roma Kayser, [place], [facility] Licensed, Certified Genetic Counselor [email or web address]

## 2020-03-13 DIAGNOSIS — C50919 Malignant neoplasm of unspecified site of unspecified female breast: Secondary | ICD-10-CM

## 2020-03-13 HISTORY — DX: Malignant neoplasm of unspecified site of unspecified female breast: C50.919

## 2020-03-16 ENCOUNTER — Ambulatory Visit: Payer: Self-pay | Admitting: General Surgery

## 2020-03-16 ENCOUNTER — Other Ambulatory Visit: Payer: Self-pay

## 2020-03-16 ENCOUNTER — Telehealth (INDEPENDENT_AMBULATORY_CARE_PROVIDER_SITE_OTHER): Payer: Self-pay | Admitting: Plastic Surgery

## 2020-03-16 DIAGNOSIS — C50811 Malignant neoplasm of overlapping sites of right female breast: Secondary | ICD-10-CM

## 2020-03-16 NOTE — Progress Notes (Signed)
   Subjective:    Patient ID: Gail Erickson, female    DOB: [birth year], 45 y.o.   MRN: [id number]      The patient is a 45 year old female here for a consultation for breast reconstruction.  She was referred by Dr. Marlou Starks.  The patient has a history of cystic breasts first noted when she was 50.  As a result she has been diligent about mammograms.  This was her first abnormal mammogram done in 11/21.  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  It is estrogen and progesterone positive with Ki-67 1% and HER-2 negative.   Her past medical history is positive for migraines and prediabetes.  Her last hemoglobin A1c was 5.2.  Her mother had breast cancer at the age of 59 as well as her maternal aunt and a paternal aunt. She is 5 feet 9 inches tall and weighs 220 pounds.  Her preoperative bra size is a 40 D cup.  She has grade 2 ptosis of both breasts.  She has a little bit of swelling today from the biopsies.  She is not a smoker.  She has a past medical history positive for prediabetes with her last hemoglobin A1c 5.2.  Dr. Marlou Starks has recommended a mastectomy with sentinel lymph node biopsy.  She is waiting on the genetic results before she makes her final decision     Review of Systems  Constitutional: Negative.   HENT: Negative.   Eyes: Negative.   Respiratory: Negative.   Cardiovascular: Negative.   Gastrointestinal: Negative.   Genitourinary: Negative.   Musculoskeletal: Negative.   Hematological: Negative.   Psychiatric/Behavioral: Negative.        Objective:   Physical Exam - Virtual      Assessment & Plan:     ICD-10-CM   1. 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 The patient gave consent to have this visit done by telemedicine / virtual visit.   This is also consent for access the chart and treat the patient via this visit. The patient is located at home.  I, the provider, am at the office.  We spent 10 minutes together for the visit.

## 2020-03-17 ENCOUNTER — Inpatient Hospital Stay: Payer: BC Managed Care – PPO | Attending: Hematology

## 2020-03-17 DIAGNOSIS — F4320 Adjustment disorder, unspecified: Secondary | ICD-10-CM | POA: Diagnosis not present

## 2020-03-17 NOTE — Progress Notes (Signed)
Nutrition Assessment   Reason for Assessment:  Referral from Dr Mosetta Putt as patient interested in healthy diet information   ASSESSMENT:  45 year old female with right breast cancer.  Planning mastectomy with SLNB, oncotype and tamoxifen.  Past medical history of pre-DM, vit D deficency.  Spoke with patient via phone for nutrition visit.  Patient with questions regarding nutrition for breast cancer.      Medications: reviewed   Labs: reviewed   Anthropometrics:   Height: 69 inches Weight: 220 lb  BMI: 32   NUTRITION DIAGNOSIS: Food and nutrition related knowledge deficit related to new diagnosis of breast cancer as evidenced by patient with questions regarding foods to eat with cancer.    INTERVENTION:  Discussed AICR recommendations with patient. Will email handout on Nutrition for Breast Cancer Contact information included   Next Visit: no follow-up  Gail Erickson, RD, LDN Registered Dietitian [phone] (mobile)

## 2020-03-18 ENCOUNTER — Other Ambulatory Visit: Payer: BC Managed Care – PPO

## 2020-03-19 ENCOUNTER — Encounter: Payer: Self-pay | Admitting: *Deleted

## 2020-03-23 ENCOUNTER — Telehealth: Payer: Self-pay | Admitting: *Deleted

## 2020-03-23 ENCOUNTER — Encounter: Payer: Self-pay | Admitting: *Deleted

## 2020-03-23 NOTE — Telephone Encounter (Signed)
Spoke to pt regarding concerns around surgery date. Reached out to both [facility] and Dr. Eusebio Friendly office. Pt was offered earlier sx date of 1/20, but d/t pt "stress level" she declined earlier sx date. Pt appreciative for work being done by the offices to move up date. Denies further questions or needs at this time. Encourage pt to reach out should any questions arise. Received verbal understanding.

## 2020-03-26 ENCOUNTER — Encounter: Payer: BC Managed Care – PPO | Admitting: Surgical

## 2020-03-29 ENCOUNTER — Other Ambulatory Visit (HOSPITAL_COMMUNITY): Payer: BC Managed Care – PPO

## 2020-03-31 DIAGNOSIS — F4320 Adjustment disorder, unspecified: Secondary | ICD-10-CM | POA: Diagnosis not present

## 2020-04-01 ENCOUNTER — Encounter (HOSPITAL_COMMUNITY): Payer: BC Managed Care – PPO

## 2020-04-11 NOTE — H&P (View-Only) (Signed)
ICD-10-CM   1. 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   MRN: [id number]   History of Present Illness: Gail Erickson is a 45 y.o.  female  with a history of right breast cancer (DCIS).  She presents for preoperative evaluation for upcoming procedure, right mastectomy with sentinel lymph node biopsy with Dr. Marlou Starks and placement of tissue expander and Flex HD with Dr. Marla Roe, scheduled for 04/28/2020.  Summary from previous visit: Patient has history of cystic breasts first noted when she was 48.  She had her first abnormal mammogram in November 2021.  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  It is ER/PR positive with Ki-67 1% and HER-2 negative.  Her mother had breast cancer at the age of 91 as well as her maternal aunt and a paternal aunt.  She is 5 feet 9 inches tall and weighs 220 pounds.  Her preoperative bra size is a 40D.  She has grade 2 ptosis of both breasts.  Job: Freight forwarder at [employer]  [place] Significant for: Migraines, prediabetes (last hemoglobin A1c was 5.2)  The patient has not had problems with anesthesia.   Past Medical History: Allergies: No Known Allergies  Current Medications:  Current Outpatient Medications:  .  Ascorbic Acid (VITAMIN C) 1000 MG tablet, Take 1,000 mg by mouth daily., Disp: , Rfl:  .  Aspirin-Acetaminophen-Caffeine (EXCEDRIN MIGRAINE PO), Take 1 tablet by mouth as needed., Disp: , Rfl:  .  Cholecalciferol (VITAMIN D3) 125 MCG (5000 UT) CAPS, Take 1 capsule by mouth daily., Disp: , Rfl:  .  Multiple Vitamin (MULTIVITAMIN) tablet, Take 1 tablet by mouth daily., Disp: , Rfl:  .  Probiotic Product (PROBIOTIC-10 PO), Take 1 tablet by mouth., Disp: , Rfl:  .  pseudoephedrine (SUDAFED) 30 MG tablet, Take 30 mg by mouth every 4 (four)  hours as needed for congestion., Disp: , Rfl:   Past Medical Problems: Past Medical History:  Diagnosis Date  . Abnormal Pap smear of cervix 06-02-14   LGSIL:neg HR HPV (1st abn. pap)  . Cyst of right breast    --stable in 2015 U/S done in [place], [place]  . Elevated DHEA ([place]) 05/21/15   level = 343  . Family history of breast cancer   . 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 Migraine without aura   . Prediabetes 2021   sees Dr.Balan  . Vitamin D deficiency 05/21/15   level = 10    Past Surgical History: Past Surgical History:  Procedure Laterality Date  . UMBILICAL HERNIA REPAIR     --as a child    Social History: Social History   Socioeconomic History  . Marital status: Single    Spouse name: Not on file  . Number of children: 0  . Years of education: Not on file  . Highest education level: Not on file  Occupational History  . Not on file  Tobacco Use  . Smoking status: Never Smoker  . Smokeless tobacco: Never Used  Vaping Use  . Vaping Use: Never used  Substance and Sexual Activity  . Alcohol use: Yes    Alcohol/week: 0.0 standard drinks    Comment: 1 drink per month  . Drug use: No  . Sexual activity:  Yes    Partners: Male    Birth control/protection: Condom    Comment: condoms everytime  Other Topics Concern  . Not on file  Social History Narrative  . Not on file   Social Determinants of Health   Financial Resource Strain: Not on file  Food Insecurity: Not on file  Transportation Needs: Not on file  Physical Activity: Not on file  Stress: Not on file  Social Connections: Not on file  Intimate Partner Violence: Not on file    Family History: Family History  Problem Relation Age of Onset  . Breast cancer Mother 14  . Diabetes Mother   . Hypertension Mother   . Hyperlipidemia Mother   . Thyroid disease Mother   . Transient ischemic attack Mother   . Breast cancer Paternal Aunt 36       bil. Breast ca--deceased CHF  . Breast  cancer Cousin 29       maternal first cousin died Breast ca age 38  . Hyperlipidemia Father   . HIV/AIDS Maternal Uncle   . Cancer Paternal Uncle        NOS - needed BMT  . Cancer Maternal Grandmother 72       adrenal gland cancer  . Breast cancer Paternal Grandmother        dx in her mid to late 2s  . Aneurysm Maternal Aunt 26       Brain  . Kidney disease Maternal Uncle   . Cancer Other        2 maternal great uncles with cancer NOS  . Breast cancer Maternal Aunt 72       Breast cancer  . Breast cancer Maternal Aunt 72    Review of Systems: Review of Systems  Constitutional: Negative for chills and fever.  HENT: Negative for congestion and sore throat.   Respiratory: Negative for cough and shortness of breath.   Cardiovascular: Negative for chest pain.  Gastrointestinal: Negative for abdominal pain, nausea and vomiting.  Skin: Negative for itching and rash.    Physical Exam: Vital Signs BP 118/74 (BP Location: Left Arm, Patient Position: Sitting, Cuff Size: Large)   Pulse 72   Ht $R'5\' 9"'qH$  (1.753 m)   Wt 223 lb (101.2 kg)   LMP 03/18/2020 (Exact Date)   SpO2 99%   BMI 32.93 kg/m  Physical Exam Vitals and nursing note reviewed.  Constitutional:      General: She is not in acute distress.    Appearance: Normal appearance. She is not ill-appearing.  HENT:     Head: Normocephalic and atraumatic.  Eyes:     Extraocular Movements: Extraocular movements intact.  Cardiovascular:     Rate and Rhythm: Normal rate and regular rhythm.     Pulses: Normal pulses.     Heart sounds: Normal heart sounds.  Pulmonary:     Effort: Pulmonary effort is normal.     Breath sounds: Normal breath sounds. No wheezing, rhonchi or rales.  Abdominal:     General: Bowel sounds are normal.     Palpations: Abdomen is soft.  Musculoskeletal:        General: No swelling. Normal range of motion.     Cervical back: Normal range of motion.  Skin:    General: Skin is warm and dry.      Coloration: Skin is not pale.     Findings: No erythema or rash.  Neurological:     General: No focal deficit present.  Mental Status: She is alert and oriented to person, place, and time.  Psychiatric:        Mood and Affect: Mood normal.        Behavior: Behavior normal.        Thought Content: Thought content normal.        Judgment: Judgment normal.     Assessment/Plan:  Ms. Schrom scheduled for right mastectomy with sentinel lymph node biopsy with Dr. Marlou Starks and placement of tissue expander and Flex HD with Dr. Marla Roe.  Risks, benefits, and alternatives of procedure discussed, questions answered and consent obtained.    Smoking Status: Non-smoker; Counseling Given?  N/A Last Mammogram: MRI-12/11; Results: DCIS in the right breast; no MRI evidence of malignancy in left breast  Caprini Score: High; Risk Factors include: 45 year old female, breast cancer, BMI > 25, and length of planned surgery. Recommendation for mechanical and pharmacological prophylaxis during surgery. Encourage early ambulation.   Pictures obtained: 03/01/2020  Post-op Rx sent to pharmacy: Norco, Zofran, Keflex, Valium, Ibuprofen  Patient was provided with the tissue expander risks and General Surgical Risk consent document and Pain Medication Agreement prior to their appointment.  They had adequate time to read through the risk consent documents and Pain Medication Agreement. We also discussed them in person together during this preop appointment. All of their questions were answered to their satisfaction.  Recommended calling if they have any further questions.  Risk consent form and Pain Medication Agreement to be scanned into patient's chart.  The risks that can be encountered with and after placement of a breast expander placement were discussed and include the following but not limited to these: bleeding, infection, delayed healing, anesthesia risks, skin sensation changes, injury to structures including  nerves, blood vessels, and muscles which may be temporary or permanent, allergies to tape, suture materials and glues, blood products, topical preparations or injected agents, skin contour irregularities, skin discoloration and swelling, deep vein thrombosis, cardiac and pulmonary complications, pain, which may persist, fluid accumulation, wrinkling of the skin over the expander, changes in nipple or breast sensation, expander leakage or rupture, faulty position of the expander, persistent pain, formation of tight scar tissue around the expander (capsular contracture), possible need for revisional surgery or staged procedures.  Electronically signed by: Threasa Heads, PA-C 04/13/2020 12:17 PM

## 2020-04-11 NOTE — Progress Notes (Signed)
ICD-10-CM   1. Malignant neoplasm of overlapping sites of right breast [facility])  C50.811       Patient ID: Gail Erickson, female    DOB: 17-Mar-1975, 45 y.o.   MRN: [id number]   History of Present Illness: Gail Erickson is a 45 y.o.  female  with a history of right breast cancer (DCIS).  She presents for preoperative evaluation for upcoming procedure, right mastectomy with sentinel lymph node biopsy with Dr. Marlou Starks and placement of tissue expander and Flex HD with Dr. Marla Roe, scheduled for 04/28/2020.  Summary from previous visit: Patient has history of cystic breasts first noted when she was 71.  She had her first abnormal mammogram in November 2021.  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  It is ER/PR positive with Ki-67 1% and HER-2 negative.  Her mother had breast cancer at the age of 49 as well as her maternal aunt and a paternal aunt.  She is 5 feet 9 inches tall and weighs 220 pounds.  Her preoperative bra size is a 40D.  She has grade 2 ptosis of both breasts.  Job: Freight forwarder at [employer]  [place] Significant for: Migraines, prediabetes (last hemoglobin A1c was 5.2)  The patient has not had problems with anesthesia.   Past Medical History: Allergies: No Known Allergies  Current Medications:  Current Outpatient Medications:  .  Ascorbic Acid (VITAMIN C) 1000 MG tablet, Take 1,000 mg by mouth daily., Disp: , Rfl:  .  Aspirin-Acetaminophen-Caffeine (EXCEDRIN MIGRAINE PO), Take 1 tablet by mouth as needed., Disp: , Rfl:  .  Cholecalciferol (VITAMIN D3) 125 MCG (5000 UT) CAPS, Take 1 capsule by mouth daily., Disp: , Rfl:  .  Multiple Vitamin (MULTIVITAMIN) tablet, Take 1 tablet by mouth daily., Disp: , Rfl:  .  Probiotic Product (PROBIOTIC-10 PO), Take 1 tablet by mouth., Disp: , Rfl:  .  pseudoephedrine (SUDAFED) 30 MG tablet, Take 30 mg by mouth every 4 (four)  hours as needed for congestion., Disp: , Rfl:   Past Medical Problems: Past Medical History:  Diagnosis Date  . Abnormal Pap smear of cervix 06-02-14   LGSIL:neg HR HPV (1st abn. pap)  . Cyst of right breast    --stable in 2015 U/S done in [place], [place]  . Elevated DHEA ([place]) 05/21/15   level = 343  . Family history of breast cancer   . 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 Migraine without aura   . Prediabetes 2021   sees Dr.Balan  . Vitamin D deficiency 05/21/15   level = 10    Past Surgical History: Past Surgical History:  Procedure Laterality Date  . UMBILICAL HERNIA REPAIR     --as a child    Social History: Social History   Socioeconomic History  . Marital status: Single    Spouse name: Not on file  . Number of children: 0  . Years of education: Not on file  . Highest education level: Not on file  Occupational History  . Not on file  Tobacco Use  . Smoking status: Never Smoker  . Smokeless tobacco: Never Used  Vaping Use  . Vaping Use: Never used  Substance and Sexual Activity  . Alcohol use: Yes    Alcohol/week: 0.0 standard drinks    Comment: 1 drink per month  . Drug use: No  . Sexual activity:  Yes    Partners: Male    Birth control/protection: Condom    Comment: condoms everytime  Other Topics Concern  . Not on file  Social History Narrative  . Not on file   Social Determinants of Health   Financial Resource Strain: Not on file  Food Insecurity: Not on file  Transportation Needs: Not on file  Physical Activity: Not on file  Stress: Not on file  Social Connections: Not on file  Intimate Partner Violence: Not on file    Family History: Family History  Problem Relation Age of Onset  . Breast cancer Mother 41  . Diabetes Mother   . Hypertension Mother   . Hyperlipidemia Mother   . Thyroid disease Mother   . Transient ischemic attack Mother   . Breast cancer Paternal Aunt 38       bil. Breast ca--deceased CHF  . Breast  cancer Cousin 21       maternal first cousin died Breast ca age 53  . Hyperlipidemia Father   . HIV/AIDS Maternal Uncle   . Cancer Paternal Uncle        NOS - needed BMT  . Cancer Maternal Grandmother 79       adrenal gland cancer  . Breast cancer Paternal Grandmother        dx in her mid to late 63s  . Aneurysm Maternal Aunt 26       Brain  . Kidney disease Maternal Uncle   . Cancer Other        2 maternal great uncles with cancer NOS  . Breast cancer Maternal Aunt 72       Breast cancer  . Breast cancer Maternal Aunt 72    Review of Systems: Review of Systems  Constitutional: Negative for chills and fever.  HENT: Negative for congestion and sore throat.   Respiratory: Negative for cough and shortness of breath.   Cardiovascular: Negative for chest pain.  Gastrointestinal: Negative for abdominal pain, nausea and vomiting.  Skin: Negative for itching and rash.    Physical Exam: Vital Signs BP 118/74 (BP Location: Left Arm, Patient Position: Sitting, Cuff Size: Large)   Pulse 72   Ht $R'5\' 9"'QD$  (1.753 m)   Wt 223 lb (101.2 kg)   LMP 03/18/2020 (Exact Date)   SpO2 99%   BMI 32.93 kg/m  Physical Exam Vitals and nursing note reviewed.  Constitutional:      General: She is not in acute distress.    Appearance: Normal appearance. She is not ill-appearing.  HENT:     Head: Normocephalic and atraumatic.  Eyes:     Extraocular Movements: Extraocular movements intact.  Cardiovascular:     Rate and Rhythm: Normal rate and regular rhythm.     Pulses: Normal pulses.     Heart sounds: Normal heart sounds.  Pulmonary:     Effort: Pulmonary effort is normal.     Breath sounds: Normal breath sounds. No wheezing, rhonchi or rales.  Abdominal:     General: Bowel sounds are normal.     Palpations: Abdomen is soft.  Musculoskeletal:        General: No swelling. Normal range of motion.     Cervical back: Normal range of motion.  Skin:    General: Skin is warm and dry.      Coloration: Skin is not pale.     Findings: No erythema or rash.  Neurological:     General: No focal deficit present.  Mental Status: She is alert and oriented to person, place, and time.  Psychiatric:        Mood and Affect: Mood normal.        Behavior: Behavior normal.        Thought Content: Thought content normal.        Judgment: Judgment normal.     Assessment/Plan:  Ms. Bergerson scheduled for right mastectomy with sentinel lymph node biopsy with Dr. Marlou Starks and placement of tissue expander and Flex HD with Dr. Marla Roe.  Risks, benefits, and alternatives of procedure discussed, questions answered and consent obtained.    Smoking Status: Non-smoker; Counseling Given?  N/A Last Mammogram: MRI-12/11; Results: DCIS in the right breast; no MRI evidence of malignancy in left breast  Caprini Score: High; Risk Factors include: 45 year old female, breast cancer, BMI > 25, and length of planned surgery. Recommendation for mechanical and pharmacological prophylaxis during surgery. Encourage early ambulation.   Pictures obtained: 03/01/2020  Post-op Rx sent to pharmacy: Norco, Zofran, Keflex, Valium, Ibuprofen  Patient was provided with the tissue expander risks and General Surgical Risk consent document and Pain Medication Agreement prior to their appointment.  They had adequate time to read through the risk consent documents and Pain Medication Agreement. We also discussed them in person together during this preop appointment. All of their questions were answered to their satisfaction.  Recommended calling if they have any further questions.  Risk consent form and Pain Medication Agreement to be scanned into patient's chart.  The risks that can be encountered with and after placement of a breast expander placement were discussed and include the following but not limited to these: bleeding, infection, delayed healing, anesthesia risks, skin sensation changes, injury to structures including  nerves, blood vessels, and muscles which may be temporary or permanent, allergies to tape, suture materials and glues, blood products, topical preparations or injected agents, skin contour irregularities, skin discoloration and swelling, deep vein thrombosis, cardiac and pulmonary complications, pain, which may persist, fluid accumulation, wrinkling of the skin over the expander, changes in nipple or breast sensation, expander leakage or rupture, faulty position of the expander, persistent pain, formation of tight scar tissue around the expander (capsular contracture), possible need for revisional surgery or staged procedures.  Electronically signed by: Threasa Heads, PA-C 04/13/2020 12:17 PM

## 2020-04-13 ENCOUNTER — Ambulatory Visit (INDEPENDENT_AMBULATORY_CARE_PROVIDER_SITE_OTHER): Payer: BC Managed Care – PPO | Admitting: Plastic Surgery

## 2020-04-13 ENCOUNTER — Encounter: Payer: Self-pay | Admitting: Plastic Surgery

## 2020-04-13 ENCOUNTER — Other Ambulatory Visit: Payer: Self-pay

## 2020-04-13 ENCOUNTER — Encounter: Payer: BC Managed Care – PPO | Admitting: Plastic Surgery

## 2020-04-13 VITALS — BP 118/74 | HR 72 | Ht 69.0 in | Wt 223.0 lb

## 2020-04-13 DIAGNOSIS — C50811 Malignant neoplasm of overlapping sites of right female breast: Secondary | ICD-10-CM

## 2020-04-13 MED ORDER — CEPHALEXIN 500 MG PO CAPS: 500.0000 mg | ORAL_CAPSULE | Freq: Four times a day (QID) | ORAL | 0 refills | Status: AC

## 2020-04-13 MED ORDER — ONDANSETRON 4 MG PO TBDP: 4.0000 mg | ORAL_TABLET | Freq: Three times a day (TID) | ORAL | 0 refills | Status: DC | PRN

## 2020-04-13 MED ORDER — DIAZEPAM 2 MG PO TABS: 2.0000 mg | ORAL_TABLET | Freq: Two times a day (BID) | ORAL | 0 refills | Status: DC | PRN

## 2020-04-13 MED ORDER — IBUPROFEN 600 MG PO TABS: 600.0000 mg | ORAL_TABLET | Freq: Four times a day (QID) | ORAL | 0 refills | Status: DC | PRN

## 2020-04-13 MED ORDER — HYDROCODONE-ACETAMINOPHEN 5-325 MG PO TABS: 1.0000 | ORAL_TABLET | Freq: Three times a day (TID) | ORAL | 0 refills | Status: AC | PRN

## 2020-04-19 DIAGNOSIS — F4320 Adjustment disorder, unspecified: Secondary | ICD-10-CM | POA: Diagnosis not present

## 2020-04-21 ENCOUNTER — Encounter (HOSPITAL_BASED_OUTPATIENT_CLINIC_OR_DEPARTMENT_OTHER): Payer: Self-pay | Admitting: General Surgery

## 2020-04-22 NOTE — Progress Notes (Signed)

## 2020-04-24 ENCOUNTER — Other Ambulatory Visit (HOSPITAL_COMMUNITY): Payer: Self-pay | Admitting: Orthopedic Surgery

## 2020-04-24 ENCOUNTER — Other Ambulatory Visit (HOSPITAL_COMMUNITY)
Admission: RE | Admit: 2020-04-24 | Discharge: 2020-04-24 | Disposition: A | Discharge status: Home / Self Care | Payer: BC Managed Care – PPO | Source: Ambulatory Visit | Attending: General Surgery | Admitting: General Surgery

## 2020-04-24 ENCOUNTER — Other Ambulatory Visit (HOSPITAL_COMMUNITY): Payer: BC Managed Care – PPO

## 2020-04-24 DIAGNOSIS — Z01812 Encounter for preprocedural laboratory examination: Secondary | ICD-10-CM | POA: Insufficient documentation

## 2020-04-24 DIAGNOSIS — Z20822 Contact with and (suspected) exposure to covid-19: Secondary | ICD-10-CM | POA: Insufficient documentation

## 2020-04-24 LAB — SARS CORONAVIRUS 2 (TAT 6-24 HRS): SARS Coronavirus 2: NEGATIVE

## 2020-04-28 ENCOUNTER — Observation Stay (HOSPITAL_BASED_OUTPATIENT_CLINIC_OR_DEPARTMENT_OTHER)
Admission: RE | Admit: 2020-04-28 | Discharge: 2020-04-29 | Disposition: A | Discharge status: Home / Self Care | Payer: BC Managed Care – PPO | Attending: Plastic Surgery | Admitting: Plastic Surgery

## 2020-04-28 ENCOUNTER — Other Ambulatory Visit (HOSPITAL_COMMUNITY): Payer: BC Managed Care – PPO

## 2020-04-28 ENCOUNTER — Ambulatory Visit (HOSPITAL_BASED_OUTPATIENT_CLINIC_OR_DEPARTMENT_OTHER): Payer: BC Managed Care – PPO | Admitting: Anesthesiology

## 2020-04-28 ENCOUNTER — Encounter (HOSPITAL_BASED_OUTPATIENT_CLINIC_OR_DEPARTMENT_OTHER): Payer: Self-pay | Admitting: General Surgery

## 2020-04-28 ENCOUNTER — Encounter (HOSPITAL_BASED_OUTPATIENT_CLINIC_OR_DEPARTMENT_OTHER)
Admission: RE | Disposition: A | Discharge status: Home / Self Care | Payer: Self-pay | Source: Home / Self Care | Attending: Plastic Surgery

## 2020-04-28 ENCOUNTER — Other Ambulatory Visit: Payer: Self-pay

## 2020-04-28 ENCOUNTER — Ambulatory Visit (HOSPITAL_COMMUNITY)
Admission: RE | Admit: 2020-04-28 | Discharge: 2020-04-28 | Disposition: A | Discharge status: Home / Self Care | Payer: BC Managed Care – PPO | Source: Ambulatory Visit | Attending: General Surgery | Admitting: General Surgery

## 2020-04-28 DIAGNOSIS — C50311 Malignant neoplasm of lower-inner quadrant of right female breast: Principal | ICD-10-CM | POA: Insufficient documentation

## 2020-04-28 DIAGNOSIS — Z170 Estrogen receptor positive status [ER+]: Secondary | ICD-10-CM | POA: Diagnosis not present

## 2020-04-28 DIAGNOSIS — Z20822 Contact with and (suspected) exposure to covid-19: Secondary | ICD-10-CM | POA: Diagnosis not present

## 2020-04-28 DIAGNOSIS — C50919 Malignant neoplasm of unspecified site of unspecified female breast: Secondary | ICD-10-CM | POA: Diagnosis present

## 2020-04-28 DIAGNOSIS — C50811 Malignant neoplasm of overlapping sites of right female breast: Secondary | ICD-10-CM

## 2020-04-28 DIAGNOSIS — G43009 Migraine without aura, not intractable, without status migrainosus: Secondary | ICD-10-CM | POA: Diagnosis not present

## 2020-04-28 DIAGNOSIS — G8918 Other acute postprocedural pain: Secondary | ICD-10-CM | POA: Diagnosis not present

## 2020-04-28 DIAGNOSIS — R7303 Prediabetes: Secondary | ICD-10-CM | POA: Diagnosis not present

## 2020-04-28 DIAGNOSIS — Z791 Long term (current) use of non-steroidal anti-inflammatories (NSAID): Secondary | ICD-10-CM | POA: Insufficient documentation

## 2020-04-28 DIAGNOSIS — C50911 Malignant neoplasm of unspecified site of right female breast: Secondary | ICD-10-CM | POA: Diagnosis not present

## 2020-04-28 DIAGNOSIS — Z421 Encounter for breast reconstruction following mastectomy: Secondary | ICD-10-CM | POA: Diagnosis not present

## 2020-04-28 DIAGNOSIS — N6489 Other specified disorders of breast: Secondary | ICD-10-CM | POA: Diagnosis not present

## 2020-04-28 HISTORY — DX: Prediabetes: R73.03

## 2020-04-28 HISTORY — PX: MASTECTOMY: SHX3

## 2020-04-28 HISTORY — PX: REDUCTION MAMMAPLASTY: SUR839

## 2020-04-28 HISTORY — PX: BREAST RECONSTRUCTION WITH PLACEMENT OF TISSUE EXPANDER AND FLEX HD (ACELLULAR HYDRATED DERMIS): SHX6295

## 2020-04-28 HISTORY — PX: MASTECTOMY W/ SENTINEL NODE BIOPSY: SHX2001

## 2020-04-28 LAB — POCT PREGNANCY, URINE: Preg Test, Ur: NEGATIVE

## 2020-04-28 SURGERY — MASTECTOMY WITH SENTINEL LYMPH NODE BIOPSY
Anesthesia: General | Site: Breast | Laterality: Right

## 2020-04-28 MED ORDER — CEFAZOLIN SODIUM-DEXTROSE 2-4 GM/100ML-% IV SOLN
2.0000 g | Freq: Three times a day (TID) | INTRAVENOUS | Status: DC
  Administered 2020-04-28 – 2020-04-29 (×2): 2 g via INTRAVENOUS
  Filled 2020-04-28 (×2): qty 100

## 2020-04-28 MED ORDER — DIPHENHYDRAMINE HCL 50 MG/ML IJ SOLN
INTRAMUSCULAR | Status: DC | PRN
  Administered 2020-04-28: 12.5 mg via INTRAVENOUS

## 2020-04-28 MED ORDER — GABAPENTIN 300 MG PO CAPS
ORAL_CAPSULE | ORAL | Status: AC
  Filled 2020-04-28: qty 1

## 2020-04-28 MED ORDER — CEFAZOLIN SODIUM-DEXTROSE 2-4 GM/100ML-% IV SOLN: 2.0000 g | INTRAVENOUS | Status: AC

## 2020-04-28 MED ORDER — DIPHENHYDRAMINE HCL 12.5 MG/5ML PO ELIX: 12.5000 mg | ORAL_SOLUTION | Freq: Four times a day (QID) | ORAL | Status: DC | PRN

## 2020-04-28 MED ORDER — SODIUM CHLORIDE (PF) 0.9 % IJ SOLN
INTRAMUSCULAR | Status: DC | PRN
  Administered 2020-04-28: 500 mL

## 2020-04-28 MED ORDER — SODIUM CHLORIDE 0.9% FLUSH: 3.0000 mL | INTRAVENOUS | Status: DC | PRN

## 2020-04-28 MED ORDER — ONDANSETRON HCL 4 MG/2ML IJ SOLN
INTRAMUSCULAR | Status: AC
  Filled 2020-04-28: qty 2

## 2020-04-28 MED ORDER — MIDAZOLAM HCL 2 MG/2ML IJ SOLN
INTRAMUSCULAR | Status: AC
  Filled 2020-04-28: qty 2

## 2020-04-28 MED ORDER — SODIUM CHLORIDE 0.9 % IV SOLN
INTRAVENOUS | Status: AC
  Filled 2020-04-28: qty 10

## 2020-04-28 MED ORDER — CHLORHEXIDINE GLUCONATE CLOTH 2 % EX PADS: 6.0000 | MEDICATED_PAD | Freq: Once | CUTANEOUS | Status: DC

## 2020-04-28 MED ORDER — MIDAZOLAM HCL 5 MG/5ML IJ SOLN
INTRAMUSCULAR | Status: DC | PRN
  Administered 2020-04-28: 1 mg via INTRAVENOUS

## 2020-04-28 MED ORDER — PROPOFOL 10 MG/ML IV BOLUS
INTRAVENOUS | Status: DC | PRN
  Administered 2020-04-28: 200 mg via INTRAVENOUS

## 2020-04-28 MED ORDER — OXYCODONE HCL 5 MG PO TABS
5.0000 mg | ORAL_TABLET | Freq: Once | ORAL | Status: DC | PRN
Start: 2020-04-28 — End: 2020-04-29
  Filled 2020-04-28: qty 1

## 2020-04-28 MED ORDER — ONDANSETRON HCL 4 MG/2ML IJ SOLN
4.0000 mg | Freq: Four times a day (QID) | INTRAMUSCULAR | Status: DC | PRN
  Administered 2020-04-28: 4 mg via INTRAVENOUS

## 2020-04-28 MED ORDER — EPHEDRINE 5 MG/ML INJ
INTRAVENOUS | Status: AC
  Filled 2020-04-28: qty 10

## 2020-04-28 MED ORDER — SODIUM CHLORIDE 0.9 % IV SOLN
INTRAVENOUS | Status: DC | PRN
  Administered 2020-04-28: 500 mL

## 2020-04-28 MED ORDER — 0.9 % SODIUM CHLORIDE (POUR BTL) OPTIME
TOPICAL | Status: DC | PRN
  Administered 2020-04-28: 1000 mL

## 2020-04-28 MED ORDER — LIDOCAINE 2% (20 MG/ML) 5 ML SYRINGE
INTRAMUSCULAR | Status: AC
  Filled 2020-04-28: qty 5

## 2020-04-28 MED ORDER — LACTATED RINGERS IV SOLN: INTRAVENOUS | Status: DC

## 2020-04-28 MED ORDER — OXYCODONE HCL 5 MG/5ML PO SOLN: 5.0000 mg | Freq: Once | ORAL | Status: DC | PRN

## 2020-04-28 MED ORDER — MIDAZOLAM HCL 2 MG/2ML IJ SOLN
2.0000 mg | Freq: Once | INTRAMUSCULAR | Status: AC
  Administered 2020-04-28: 2 mg via INTRAVENOUS

## 2020-04-28 MED ORDER — ONDANSETRON 4 MG PO TBDP: 4.0000 mg | ORAL_TABLET | Freq: Four times a day (QID) | ORAL | Status: DC | PRN

## 2020-04-28 MED ORDER — EPHEDRINE SULFATE 50 MG/ML IJ SOLN
INTRAMUSCULAR | Status: DC | PRN
  Administered 2020-04-28: 10 mg via INTRAVENOUS

## 2020-04-28 MED ORDER — PHENYLEPHRINE 40 MCG/ML (10ML) SYRINGE FOR IV PUSH (FOR BLOOD PRESSURE SUPPORT)
PREFILLED_SYRINGE | INTRAVENOUS | Status: AC
  Filled 2020-04-28: qty 10

## 2020-04-28 MED ORDER — CELECOXIB 200 MG PO CAPS
ORAL_CAPSULE | ORAL | Status: AC
  Filled 2020-04-28: qty 1

## 2020-04-28 MED ORDER — ONDANSETRON HCL 4 MG/2ML IJ SOLN
4.0000 mg | Freq: Four times a day (QID) | INTRAMUSCULAR | Status: DC | PRN
  Filled 2020-04-28: qty 2

## 2020-04-28 MED ORDER — ZOLPIDEM TARTRATE 5 MG PO TABS: 5.0000 mg | ORAL_TABLET | Freq: Every evening | ORAL | Status: DC | PRN

## 2020-04-28 MED ORDER — IBUPROFEN 200 MG PO TABS
400.0000 mg | ORAL_TABLET | Freq: Four times a day (QID) | ORAL | Status: DC
  Administered 2020-04-28 – 2020-04-29 (×3): 400 mg via ORAL
  Filled 2020-04-28 (×2): qty 2

## 2020-04-28 MED ORDER — SODIUM CHLORIDE 0.9% FLUSH: 3.0000 mL | Freq: Two times a day (BID) | INTRAVENOUS | Status: DC

## 2020-04-28 MED ORDER — FENTANYL CITRATE (PF) 100 MCG/2ML IJ SOLN
25.0000 ug | INTRAMUSCULAR | Status: DC | PRN
  Administered 2020-04-28: 50 ug via INTRAVENOUS

## 2020-04-28 MED ORDER — ACETAMINOPHEN 500 MG PO TABS
1000.0000 mg | ORAL_TABLET | ORAL | Status: AC
  Administered 2020-04-28: 1000 mg via ORAL

## 2020-04-28 MED ORDER — IBUPROFEN 200 MG PO TABS
ORAL_TABLET | ORAL | Status: AC
  Filled 2020-04-28: qty 2

## 2020-04-28 MED ORDER — ACETAMINOPHEN 325 MG PO TABS
325.0000 mg | ORAL_TABLET | Freq: Four times a day (QID) | ORAL | Status: DC
  Administered 2020-04-28 – 2020-04-29 (×3): 325 mg via ORAL
  Filled 2020-04-28 (×2): qty 1

## 2020-04-28 MED ORDER — SODIUM CHLORIDE 0.9 % IV SOLN: 250.0000 mL | INTRAVENOUS | Status: DC | PRN

## 2020-04-28 MED ORDER — SENNA 8.6 MG PO TABS: 1.0000 | ORAL_TABLET | Freq: Two times a day (BID) | ORAL | Status: DC

## 2020-04-28 MED ORDER — FENTANYL CITRATE (PF) 100 MCG/2ML IJ SOLN
INTRAMUSCULAR | Status: DC | PRN
  Administered 2020-04-28 (×2): 50 ug via INTRAVENOUS

## 2020-04-28 MED ORDER — ACETAMINOPHEN 500 MG PO TABS
ORAL_TABLET | ORAL | Status: AC
  Filled 2020-04-28: qty 2

## 2020-04-28 MED ORDER — SCOPOLAMINE 1 MG/3DAYS TD PT72
MEDICATED_PATCH | TRANSDERMAL | Status: DC | PRN
  Administered 2020-04-28: 1 via TRANSDERMAL

## 2020-04-28 MED ORDER — ROCURONIUM BROMIDE 100 MG/10ML IV SOLN
INTRAVENOUS | Status: DC | PRN
  Administered 2020-04-28: 70 mg via INTRAVENOUS

## 2020-04-28 MED ORDER — HYDROMORPHONE HCL 1 MG/ML IJ SOLN
1.0000 mg | INTRAMUSCULAR | Status: DC | PRN
  Administered 2020-04-28: 1 mg via INTRAVENOUS
  Filled 2020-04-28: qty 1

## 2020-04-28 MED ORDER — DEXAMETHASONE SODIUM PHOSPHATE 4 MG/ML IJ SOLN
INTRAMUSCULAR | Status: DC | PRN
  Administered 2020-04-28: 10 mg via INTRAVENOUS

## 2020-04-28 MED ORDER — SCOPOLAMINE 1 MG/3DAYS TD PT72
MEDICATED_PATCH | TRANSDERMAL | Status: AC
  Filled 2020-04-28: qty 1

## 2020-04-28 MED ORDER — KCL IN DEXTROSE-NACL 20-5-0.45 MEQ/L-%-% IV SOLN
INTRAVENOUS | Status: DC
  Filled 2020-04-28: qty 1000

## 2020-04-28 MED ORDER — FENTANYL CITRATE (PF) 100 MCG/2ML IJ SOLN
100.0000 ug | Freq: Once | INTRAMUSCULAR | Status: AC
  Administered 2020-04-28: 50 ug via INTRAVENOUS

## 2020-04-28 MED ORDER — DEXAMETHASONE SODIUM PHOSPHATE 10 MG/ML IJ SOLN
INTRAMUSCULAR | Status: AC
  Filled 2020-04-28: qty 1

## 2020-04-28 MED ORDER — FENTANYL CITRATE (PF) 100 MCG/2ML IJ SOLN
INTRAMUSCULAR | Status: AC
  Filled 2020-04-28: qty 2

## 2020-04-28 MED ORDER — SUCCINYLCHOLINE CHLORIDE 200 MG/10ML IV SOSY
PREFILLED_SYRINGE | INTRAVENOUS | Status: AC
  Filled 2020-04-28: qty 10

## 2020-04-28 MED ORDER — DIAZEPAM 2 MG PO TABS: 2.0000 mg | ORAL_TABLET | Freq: Two times a day (BID) | ORAL | Status: DC | PRN

## 2020-04-28 MED ORDER — SUGAMMADEX SODIUM 200 MG/2ML IV SOLN
INTRAVENOUS | Status: DC | PRN
  Administered 2020-04-28: 200 mg via INTRAVENOUS

## 2020-04-28 MED ORDER — CEFAZOLIN SODIUM-DEXTROSE 2-4 GM/100ML-% IV SOLN
INTRAVENOUS | Status: AC
  Filled 2020-04-28: qty 100

## 2020-04-28 MED ORDER — ACETAMINOPHEN 325 MG PO TABS: 650.0000 mg | ORAL_TABLET | ORAL | Status: DC | PRN

## 2020-04-28 MED ORDER — CEFAZOLIN SODIUM-DEXTROSE 2-4 GM/100ML-% IV SOLN
2.0000 g | INTRAVENOUS | Status: AC
  Administered 2020-04-28: 2 g via INTRAVENOUS

## 2020-04-28 MED ORDER — PROPOFOL 10 MG/ML IV BOLUS
INTRAVENOUS | Status: AC
  Filled 2020-04-28: qty 20

## 2020-04-28 MED ORDER — OXYCODONE HCL 5 MG PO TABS
5.0000 mg | ORAL_TABLET | ORAL | Status: DC | PRN
  Administered 2020-04-28: 5 mg via ORAL
  Administered 2020-04-28 – 2020-04-29 (×3): 10 mg via ORAL
  Filled 2020-04-28: qty 2

## 2020-04-28 MED ORDER — OXYCODONE HCL 5 MG PO TABS
5.0000 mg | ORAL_TABLET | ORAL | Status: DC | PRN
Start: 2020-04-28 — End: 2020-04-29
  Filled 2020-04-28 (×2): qty 2

## 2020-04-28 MED ORDER — DIPHENHYDRAMINE HCL 50 MG/ML IJ SOLN: 12.5000 mg | Freq: Four times a day (QID) | INTRAMUSCULAR | Status: DC | PRN

## 2020-04-28 MED ORDER — GABAPENTIN 300 MG PO CAPS
300.0000 mg | ORAL_CAPSULE | ORAL | Status: AC
  Administered 2020-04-28: 300 mg via ORAL

## 2020-04-28 MED ORDER — LIDOCAINE HCL (CARDIAC) PF 100 MG/5ML IV SOSY
PREFILLED_SYRINGE | INTRAVENOUS | Status: DC | PRN
  Administered 2020-04-28: 50 mg via INTRAVENOUS

## 2020-04-28 MED ORDER — ACETAMINOPHEN 325 MG RE SUPP: 650.0000 mg | RECTAL | Status: DC | PRN

## 2020-04-28 MED ORDER — PROPOFOL 500 MG/50ML IV EMUL
INTRAVENOUS | Status: AC
  Filled 2020-04-28: qty 50

## 2020-04-28 MED ORDER — FENTANYL CITRATE (PF) 100 MCG/2ML IJ SOLN: 25.0000 ug | INTRAMUSCULAR | Status: DC | PRN

## 2020-04-28 MED ORDER — PROPOFOL 500 MG/50ML IV EMUL
INTRAVENOUS | Status: DC | PRN
  Administered 2020-04-28: 25 ug/kg/min via INTRAVENOUS

## 2020-04-28 MED ORDER — BUPIVACAINE-EPINEPHRINE (PF) 0.5% -1:200000 IJ SOLN
INTRAMUSCULAR | Status: DC | PRN
Start: 2020-04-28 — End: 2020-04-28
  Administered 2020-04-28: 20 mL via PERINEURAL

## 2020-04-28 MED ORDER — ROCURONIUM BROMIDE 10 MG/ML (PF) SYRINGE
PREFILLED_SYRINGE | INTRAVENOUS | Status: AC
  Filled 2020-04-28: qty 10

## 2020-04-28 MED ORDER — ONDANSETRON HCL 4 MG/2ML IJ SOLN
INTRAMUSCULAR | Status: DC | PRN
  Administered 2020-04-28: 4 mg via INTRAVENOUS

## 2020-04-28 MED ORDER — CELECOXIB 200 MG PO CAPS
200.0000 mg | ORAL_CAPSULE | ORAL | Status: AC
  Administered 2020-04-28: 200 mg via ORAL

## 2020-04-28 MED ORDER — POLYETHYLENE GLYCOL 3350 17 G PO PACK: 17.0000 g | PACK | Freq: Every day | ORAL | Status: DC | PRN

## 2020-04-28 MED ORDER — TECHNETIUM TC 99M TILMANOCEPT KIT
1.0000 | PACK | Freq: Once | INTRAVENOUS | Status: AC | PRN
  Administered 2020-04-28: 1 via INTRADERMAL

## 2020-04-28 MED ORDER — DIPHENHYDRAMINE HCL 50 MG/ML IJ SOLN
INTRAMUSCULAR | Status: AC
  Filled 2020-04-28: qty 1

## 2020-04-28 SURGICAL SUPPLY — 87 items
APPLIER CLIP 9.375 MED OPEN (MISCELLANEOUS) ×2
BAG DECANTER FOR FLEXI CONT (MISCELLANEOUS) ×2 IMPLANT
BINDER BREAST LRG (GAUZE/BANDAGES/DRESSINGS) IMPLANT
BINDER BREAST MEDIUM (GAUZE/BANDAGES/DRESSINGS) IMPLANT
BINDER BREAST XLRG (GAUZE/BANDAGES/DRESSINGS) IMPLANT
BINDER BREAST XXLRG (GAUZE/BANDAGES/DRESSINGS) ×2 IMPLANT
BIOPATCH RED 1 DISK 7.0 (GAUZE/BANDAGES/DRESSINGS) ×2 IMPLANT
BLADE HEX COATED 2.75 (ELECTRODE) IMPLANT
BLADE SURG 10 STRL SS (BLADE) ×2 IMPLANT
BLADE SURG 15 STRL LF DISP TIS (BLADE) ×1 IMPLANT
BLADE SURG 15 STRL SS (BLADE) ×1
BNDG GAUZE ELAST 4 BULKY (GAUZE/BANDAGES/DRESSINGS) IMPLANT
CANISTER SUCT 1200ML W/VALVE (MISCELLANEOUS) ×4 IMPLANT
CHLORAPREP W/TINT 26 (MISCELLANEOUS) ×4 IMPLANT
CLIP APPLIE 9.375 MED OPEN (MISCELLANEOUS) ×1 IMPLANT
COVER BACK TABLE 60X90IN (DRAPES) ×2 IMPLANT
COVER MAYO STAND STRL (DRAPES) ×2 IMPLANT
COVER PROBE W GEL 5X96 (DRAPES) IMPLANT
COVER WAND RF STERILE (DRAPES) IMPLANT
DECANTER SPIKE VIAL GLASS SM (MISCELLANEOUS) IMPLANT
DERMABOND ADVANCED (GAUZE/BANDAGES/DRESSINGS) ×1
DERMABOND ADVANCED .7 DNX12 (GAUZE/BANDAGES/DRESSINGS) ×1 IMPLANT
DEVICE DISSECT PLASMABLAD 3.0S (MISCELLANEOUS) IMPLANT
DEVICE DSSCT PLSMBLD 3.0S LGHT (MISCELLANEOUS) ×1 IMPLANT
DRAIN CHANNEL 19F RND (DRAIN) ×2 IMPLANT
DRAPE GAMMA PROBE CRDLSS 10X38 (DRAPES) ×2 IMPLANT
DRAPE LAPAROSCOPIC ABDOMINAL (DRAPES) ×2 IMPLANT
DRAPE SURG 17X23 STRL (DRAPES) ×2 IMPLANT
DRAPE UTILITY XL STRL (DRAPES) ×2 IMPLANT
DRSG OPSITE POSTOP 4X6 (GAUZE/BANDAGES/DRESSINGS) ×2 IMPLANT
DRSG PAD ABDOMINAL 8X10 ST (GAUZE/BANDAGES/DRESSINGS) ×4 IMPLANT
ELECT BLADE 4.0 EZ CLEAN MEGAD (MISCELLANEOUS) ×2
ELECT COATED BLADE 2.86 ST (ELECTRODE) IMPLANT
ELECT REM PT RETURN 9FT ADLT (ELECTROSURGICAL) ×2
ELECTRODE BLDE 4.0 EZ CLN MEGD (MISCELLANEOUS) ×1 IMPLANT
ELECTRODE REM PT RTRN 9FT ADLT (ELECTROSURGICAL) ×1 IMPLANT
EVACUATOR SILICONE 100CC (DRAIN) ×2 IMPLANT
FUNNEL KELLER 2 DISP (MISCELLANEOUS) IMPLANT
GAUZE SPONGE 4X4 12PLY STRL LF (GAUZE/BANDAGES/DRESSINGS) IMPLANT
GLOVE SURG ENC MOIS LTX SZ6.5 (GLOVE) ×4 IMPLANT
GLOVE SURG ENC MOIS LTX SZ7.5 (GLOVE) ×2 IMPLANT
GOWN STRL REUS W/ TWL LRG LVL3 (GOWN DISPOSABLE) ×4 IMPLANT
GOWN STRL REUS W/TWL LRG LVL3 (GOWN DISPOSABLE) ×4
GRAFT FLEX HD 6X16 PLIABLE (Tissue) ×2 IMPLANT
ILLUMINATOR WAVEGUIDE N/F (MISCELLANEOUS) IMPLANT
IMPL EXPANDER BREAST 535CC (Breast) ×1 IMPLANT
IMPLANT BREAST 535CC (Breast) ×1 IMPLANT
IMPLANT EXPANDER BREAST 535CC (Breast) ×1 IMPLANT
IV NS 1000ML (IV SOLUTION)
IV NS 1000ML BAXH (IV SOLUTION) IMPLANT
IV NS 500ML (IV SOLUTION) ×1
IV NS 500ML BAXH (IV SOLUTION) ×1 IMPLANT
KIT FILL ASEPTIC TRANSFER (MISCELLANEOUS) ×2 IMPLANT
KIT FILL SYSTEM UNIVERSAL (SET/KITS/TRAYS/PACK) ×2 IMPLANT
LIGHT WAVEGUIDE WIDE FLAT (MISCELLANEOUS) IMPLANT
NEEDLE HYPO 25X1 1.5 SAFETY (NEEDLE) IMPLANT
NS IRRIG 1000ML POUR BTL (IV SOLUTION) ×2 IMPLANT
PACK BASIN DAY SURGERY FS (CUSTOM PROCEDURE TRAY) ×2 IMPLANT
PAD FOAM SILICONE BACKED (GAUZE/BANDAGES/DRESSINGS) IMPLANT
PENCIL SMOKE EVACUATOR (MISCELLANEOUS) ×2 IMPLANT
PIN SAFETY STERILE (MISCELLANEOUS) ×2 IMPLANT
PLASMABLADE 3.0S (MISCELLANEOUS)
PLASMABLADE 3.0S W/LIGHT (MISCELLANEOUS) ×2
SLEEVE SCD COMPRESS KNEE MED (MISCELLANEOUS) ×2 IMPLANT
SPONGE LAP 18X18 RF (DISPOSABLE) ×4 IMPLANT
STRIP SUTURE WOUND CLOSURE 1/2 (MISCELLANEOUS) ×2 IMPLANT
SUT ETHILON 2 0 FS 18 (SUTURE) IMPLANT
SUT MNCRL AB 4-0 PS2 18 (SUTURE) ×4 IMPLANT
SUT MON AB 3-0 SH 27 (SUTURE) ×1
SUT MON AB 3-0 SH27 (SUTURE) ×1 IMPLANT
SUT MON AB 5-0 PS2 18 (SUTURE) ×4 IMPLANT
SUT PDS 3-0 CT2 (SUTURE)
SUT PDS AB 2-0 CT2 27 (SUTURE) ×10 IMPLANT
SUT PDS II 3-0 CT2 27 ABS (SUTURE) IMPLANT
SUT SILK 2 0 SH (SUTURE) ×6 IMPLANT
SUT SILK 3 0 PS 1 (SUTURE) IMPLANT
SUT VIC AB 3-0 SH 27 (SUTURE)
SUT VIC AB 3-0 SH 27X BRD (SUTURE) IMPLANT
SUT VICRYL 3-0 CR8 SH (SUTURE) ×2 IMPLANT
SYR BULB IRRIG 60ML STRL (SYRINGE) ×2 IMPLANT
SYR CONTROL 10ML LL (SYRINGE) IMPLANT
TOWEL GREEN STERILE FF (TOWEL DISPOSABLE) ×4 IMPLANT
TRAY DSU PREP LF (CUSTOM PROCEDURE TRAY) IMPLANT
TRAY FOLEY W/BAG SLVR 14FR LF (SET/KITS/TRAYS/PACK) IMPLANT
TUBE CONNECTING 20X1/4 (TUBING) ×4 IMPLANT
UNDERPAD 30X36 HEAVY ABSORB (UNDERPADS AND DIAPERS) ×4 IMPLANT
YANKAUER SUCT BULB TIP NO VENT (SUCTIONS) ×2 IMPLANT

## 2020-04-28 NOTE — Anesthesia Preprocedure Evaluation (Signed)
Anesthesia Evaluation  Patient identified by MRN, date of birth, ID band Patient awake    Reviewed: Allergy & Precautions, H&P , NPO status , Patient's Chart, lab work & pertinent test results  Airway Mallampati: II   Neck ROM: full    Dental   Pulmonary neg pulmonary ROS,    breath sounds clear to auscultation       Cardiovascular negative cardio ROS   Rhythm:regular Rate:Normal     Neuro/Psych  Headaches,    GI/Hepatic   Endo/Other    Renal/GU      Musculoskeletal   Abdominal   Peds  Hematology   Anesthesia Other Findings   Reproductive/Obstetrics Breast CA                            Anesthesia Physical Anesthesia Plan  ASA: II  Anesthesia Plan: General   Post-op Pain Management:  Regional for Post-op pain   Induction: Intravenous  PONV Risk Score and Plan: 3 and Ondansetron, Dexamethasone, Midazolam and Treatment may vary due to age or medical condition  Airway Management Planned: Oral ETT  Additional Equipment:   Intra-op Plan:   Post-operative Plan: Extubation in OR  Informed Consent: I have reviewed the patients History and Physical, chart, labs and discussed the procedure including the risks, benefits and alternatives for the proposed anesthesia with the patient or authorized representative who has indicated his/her understanding and acceptance.       Plan Discussed with: CRNA, Anesthesiologist and Surgeon  Anesthesia Plan Comments:         Anesthesia Quick Evaluation

## 2020-04-28 NOTE — Anesthesia Procedure Notes (Signed)
Anesthesia Regional Block: Pectoralis block   Pre-Anesthetic Checklist: ,, timeout performed, Correct Patient, Correct Site, Correct Laterality, Correct Procedure, Correct Position, site marked, Risks and benefits discussed,  Surgical consent,  Pre-op evaluation,  At surgeon's request and post-op pain management  Laterality: Right  Prep: chloraprep       Needles:  Injection technique: Single-shot  Needle Type: Echogenic Needle     Needle Length: 9cm  Needle Gauge: 21     Additional Needles:   Narrative:  Start time: 04/28/2020 7:47 AM End time: 04/28/2020 7:59 AM Injection made incrementally with aspirations every 5 mL.  Performed by: Personally  Anesthesiologist: Albertha Ghee, MD  Additional Notes: Pt tolerated the procedure well.

## 2020-04-28 NOTE — Anesthesia Procedure Notes (Signed)
Procedure Name: Intubation Date/Time: 04/28/2020 8:37 AM Performed by: Willa Frater, CRNA Pre-anesthesia Checklist: Patient identified, Emergency Drugs available, Suction available and Patient being monitored Patient Re-evaluated:Patient Re-evaluated prior to induction Oxygen Delivery Method: Circle system utilized Preoxygenation: Pre-oxygenation with 100% oxygen Induction Type: IV induction Ventilation: Mask ventilation without difficulty Tube type: Oral Tube size: 7.0 mm Number of attempts: 1 Airway Equipment and Method: Stylet and Oral airway Placement Confirmation: ETT inserted through vocal cords under direct vision,  positive ETCO2 and breath sounds checked- equal and bilateral Secured at: 22 cm Tube secured with: Tape Dental Injury: Teeth and Oropharynx as per pre-operative assessment

## 2020-04-28 NOTE — Progress Notes (Signed)
  Assisted Dr. Hodierne with right, ultrasound guided, pectoralis block. Side rails up, monitors on throughout procedure. See vital signs in flow sheet. Tolerated Procedure well. 

## 2020-04-28 NOTE — Transfer of Care (Signed)
Immediate Anesthesia Transfer of Care Note  Patient: Gail Erickson  Procedure(s) Performed: RIGHT MASTECTOMY WITH SENTINEL LYMPH NODE BIOPSY (Right Breast) IMMEDIATE RIGHT BREAST RECONSTRUCTION WITH PLACEMENT OF TISSUE EXPANDER AND FLEX HD (ACELLULAR HYDRATED DERMIS) (Right Breast)  Patient Location: PACU  Anesthesia Type:General and GA combined with regional for post-op pain  Level of Consciousness: awake, alert , oriented, drowsy and patient cooperative  Airway & Oxygen Therapy: Patient Spontanous Breathing and Patient connected to face mask oxygen  Post-op Assessment: Report given to RN and Post -op Vital signs reviewed and stable  Post vital signs: Reviewed and stable  Last Vitals:  Vitals Value Taken Time  BP 112/72 04/28/20 1058  Temp    Pulse 84 04/28/20 1059  Resp 25 04/28/20 1059  SpO2 100 % 04/28/20 1059    Last Pain:  Vitals:   04/28/20 0752  TempSrc:   PainSc: 0-No pain      Patients Stated Pain Goal: 5 (57/50/51 8335)  Complications: No complications documented.

## 2020-04-28 NOTE — Anesthesia Postprocedure Evaluation (Signed)
Anesthesia Post Note  Patient: Gail Erickson  Procedure(s) Performed: RIGHT MASTECTOMY WITH SENTINEL LYMPH NODE BIOPSY (Right Breast) IMMEDIATE RIGHT BREAST RECONSTRUCTION WITH PLACEMENT OF TISSUE EXPANDER AND FLEX HD (ACELLULAR HYDRATED DERMIS) (Right Breast)     Patient location during evaluation: PACU Anesthesia Type: General Level of consciousness: awake and alert Pain management: pain level controlled Vital Signs Assessment: post-procedure vital signs reviewed and stable Respiratory status: spontaneous breathing, nonlabored ventilation, respiratory function stable and patient connected to nasal cannula oxygen Cardiovascular status: blood pressure returned to baseline and stable Postop Assessment: no apparent nausea or vomiting Anesthetic complications: no   No complications documented.  Last Vitals:  Vitals:   04/28/20 1130 04/28/20 1210  BP: 115/76 109/73  Pulse: 70 66  Resp: (!) 23 20  Temp:  36.6 C  SpO2: 98% 99%    Last Pain:  Vitals:   04/28/20 1210  TempSrc:   PainSc: [place]

## 2020-04-28 NOTE — Op Note (Signed)
04/28/2020  11:41 AM  PATIENT:  Gail Erickson  45 y.o. female  PRE-OPERATIVE DIAGNOSIS:  DCIS RIGHT BREAST  POST-OPERATIVE DIAGNOSIS:  DCIS RIGHT BREAST  PROCEDURE:  Procedure(s) with comments: RIGHT MASTECTOMY WITH SENTINEL LYMPH NODE BIOPSY (Right) - PECTORAL BLOCK  SURGEON:  Surgeon(s) and Role: Panel 1:    [name], MD - Primary  PHYSICIAN ASSISTANT:   ASSISTANTS: none   ANESTHESIA:   general  EBL:  25 mL   BLOOD ADMINISTERED:none  DRAINS: none   LOCAL MEDICATIONS USED:  NONE  SPECIMEN:  Source of Specimen:  right mastectomy and sentinel node  DISPOSITION OF SPECIMEN:  PATHOLOGY  COUNTS:  YES  TOURNIQUET:  * No tourniquets in log *  DICTATION: .Dragon Dictation   After informed consent was obtained the patient was brought to the operating room and placed in the supine position on the operating table.  After adequate induction of general anesthesia the patient's bilateral chest, breast, and axillary areas were prepped with ChloraPrep, allowed to dry, and draped in usual sterile manner.  An appropriate timeout was performed.  Earlier in the day the patient underwent injection of 1 mCi of technetium sulfur colloid in the subareolar position on the right.  The neoprobe was set to technetium and there was a good signal in the right axilla.  Next an elliptical incision was made around the nipple and areola complex in order to spare the skin.  The incision was carried through the skin and subcutaneous tissue sharply with the PlasmaBlade.  Breast hooks were used to elevate the skin flaps anteriorly towards the ceiling.  Thin skin flaps were then created by dissecting between the breast tissue and the subcutaneous fat and skin.  This dissection was carried all the way to the chest wall circumferentially.  Next the breast was removed from the pectoralis muscle with the pectoralis fascia.  Once this was accomplished the entire right breast was removed from the patient.  It was  marked with a stitch on the lateral skin and sent to pathology for further evaluation.  The neoprobe was used to find the area of radioactivity in the right axilla.  The radioactive lymph node was then excised sharply with the PlasmaBlade and the surrounding small vessels and lymphatics were controlled with clips.  Ex vivo counts on this node were approximately 800.  No other hot or palpable nodes were identified in the right axilla.  Hemostasis was achieved using the PlasmaBlade.  The wound was irrigated with saline and packed with a moistened lap sponge.  The patient tolerated the procedure well.  All needle sponge and instrument counts were correct.  At this point the operation was turned over to Dr. Marla Roe for the reconstruction portion.  Her portion will be dictated separately.  PLAN OF CARE: Admit for overnight observation  PATIENT DISPOSITION:  PACU - hemodynamically stable.   Delay start of Pharmacological VTE agent (>24hrs) due to surgical blood loss or risk of bleeding: not applicable

## 2020-04-28 NOTE — Op Note (Addendum)
Op report    DATE OF OPERATION:  04/28/2020  LOCATION: [place]  SURGICAL DIVISION: Plastic Surgery  PREOPERATIVE DIAGNOSES:  1. Right Breast cancer.    POSTOPERATIVE DIAGNOSES:  1. Right Breast cancer.   PROCEDURE:  1. Right immediate breast reconstruction with placement of Acellular Dermal Matrix and tissue expander.  SURGEON:  [place] Rosevear Sanger Yeshaya Vath, DO  ASSISTANT: Phoebe Sharps, PA  ANESTHESIA:  General.   COMPLICATIONS: None.   IMPLANTS: Right - Mentor 535 cc. Ref #[id number].  Serial Number [id number], 250 cc of injectable saline placed in the expander. Acellular Dermal Matrix 6 x 16 cm Flex HD  INDICATIONS FOR PROCEDURE:  The patient, Gail Erickson, is a 45 y.o. female born on October 16, 1975, is here for  immediate first stage breast reconstruction with placement of a right tissue expander and Acellular dermal matrix. MRN: [id number]  CONSENT:  Informed consent was obtained directly from the patient. Risks, benefits and alternatives were fully discussed. Specific risks including but not limited to bleeding, infection, hematoma, seroma, scarring, pain, implant infection, implant extrusion, capsular contracture, asymmetry, wound healing problems, and need for further surgery were all discussed. The patient did have an ample opportunity to have her questions answered to her satisfaction.   DESCRIPTION OF PROCEDURE:  The patient was taken to the operating room by the general surgery team. SCDs were placed and IV antibiotics were given. The patient's chest was prepped and draped in a sterile fashion. A time out was performed and the implants to be used were identified.  A right mastectomy was performed.  Once the general surgery team had completed their portion of the case the patient was rendered to the plastic and reconstructive surgery team.  Right:  The pectoralis major muscle was lifted from the chest wall with release of the lateral edge and lateral  inframammary fold.  The pocket was irrigated with antibiotic solution and hemostasis was achieved with electrocautery.  The ADM was then prepared according to the manufacture guidelines and slits placed to help with postoperative fluid management.  The ADM was then sutured to the inferior and lateral edge of the inframammary fold with 2-0 PDS starting with an interrupted stitch and then a running stitch.  The lateral portion was sutured to with interrupted sutures after the expander was placed.  The expander was prepared according to the manufacture guidelines, the air evacuated and then it was placed under the ADM and pectoralis major muscle.  The inferior and lateral tabs were used to secure the expander to the chest wall with 2-0 PDS.  The drain was placed at the inframammary fold over the ADM and secured to the skin with 3-0 Silk.    The deep layers were closed with 3-0 Monocryl followed by 4-0 Monocryl.  The skin was closed with 5-0 Monocryl and then dermabond was applied.  The ABDs and breast binder were placed.  The patient tolerated the procedure well and there were no complications.  The patient was allowed to wake from anesthesia and taken to the recovery room in satisfactory condition.   The advanced practice practitioner (APP) assisted throughout the case.  The APP was essential in retraction and counter traction when needed to make the case progress smoothly.  This retraction and assistance made it possible to see the tissue plans for the procedure.  The assistance was needed for blood control, tissue re-approximation and assisted with closure of the incision site.

## 2020-04-28 NOTE — H&P (Signed)
Gail Erickson  Location: [facility] Surgery Patient #: [id number] DOB: 04-19-1975 Single / Language: [name] / Race: Black or African American Female   History of Present Illness The patient is a 45 year old female who presents with breast cancer. We are asked to see the patient in consultation by Dr. Judeth Horn to evaluate her for a new right breast cancer. The patient is a 45 year old black female who recently went for a routine screening mammogram. At that time she was found to have abnormal calcifications in the medial aspect of the right breast covering about 7 cm and extending all the way to the nipple. 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 No abnormal-looking lymph nodes were seen on the right side on her MRI. There were 2 small masses in the medial left breast that were seen on mammogram but not on MRI. These are scheduled to be biopsied on Friday. She does have a family history of breast cancer in her mother, paternal grandmother and aunt, and maternal cousin and aunt. She is otherwise in good health and does not smoke.   Past Surgical History  Breast Biopsy  Right. Ventral / Umbilical Hernia Surgery  Left.  Diagnostic Studies History  Colonoscopy  never Mammogram  within last year Pap Smear  1-5 years ago  Allergies  No Known Drug Allergies    Medication History No Current Medications Medications Reconciled  Social History  Alcohol use  Occasional alcohol use. Caffeine use  Carbonated beverages, Coffee, Tea. No drug use  Tobacco use  Never smoker.  Family History  Arthritis  Mother. Breast Cancer  Mother. Diabetes Mellitus  Mother. Hypertension  Brother, Father, Mother. Prostate Cancer  Father. Thyroid problems   Mother.  Pregnancy / Birth History  Age at menarche  46 years. Gravida  0 Para  0 Regular periods   Other Problems  Umbilical Hernia Repair     Review of Systems General Not Present- Appetite Loss, Chills, Fatigue, Fever, Night Sweats, Weight Gain and Weight Loss. HEENT Not Present- Earache, Hearing Loss, Hoarseness, Nose Bleed, Oral Ulcers, Ringing in the Ears, Seasonal Allergies, Sinus Pain, Sore Throat, Visual Disturbances, Wears glasses/contact lenses and Yellow Eyes. Respiratory Not Present- Bloody sputum, Chronic Cough, Difficulty Breathing, Snoring and Wheezing. Breast Not Present- Breast Mass, Breast Pain, Nipple Discharge and Skin Changes. Cardiovascular Not Present- Chest Pain, Difficulty Breathing Lying Down, Leg Cramps, Palpitations, Rapid Heart Rate, Shortness of Breath and Swelling of Extremities. Gastrointestinal Not Present- Abdominal Pain, Bloating, Bloody Stool, Change in Bowel Habits, Chronic diarrhea, Constipation, Difficulty Swallowing, Excessive gas, Gets full quickly at meals, Hemorrhoids, Indigestion, Nausea, Rectal Pain and Vomiting. Female Genitourinary Not Present- Frequency, Nocturia, Painful Urination, Pelvic Pain and Urgency. Musculoskeletal Not Present- Back Pain, Joint Pain, Joint Stiffness, Muscle Pain, Muscle Weakness and Swelling of Extremities. Neurological Not Present- Decreased Memory, Fainting, Headaches, Numbness, Seizures, Tingling, Tremor, Trouble walking and Weakness. Psychiatric Not Present- Anxiety, Bipolar, Change in Sleep Pattern, Depression, Fearful and Frequent crying. Endocrine Not Present- Cold Intolerance, Excessive Hunger, Hair Changes, Heat Intolerance, Hot flashes and New Diabetes. Hematology Not Present- Blood Thinners, Easy Bruising, Excessive bleeding, Gland problems, HIV and Persistent Infections.  Vitals  Weight: 222.8 lb Height: 69in Body Surface Area: 2.16 m Body Mass Index: 32.9 kg/m  Temp.: 98.19F  Pulse:  104 (Regular)  BP: 122/86(Sitting, Left Arm, Standard)       Physical Exam  General Mental Status-Alert. General Appearance-Consistent with stated age. Hydration-Well hydrated. Voice-Normal.  Head and Neck Head-normocephalic, atraumatic with no lesions or palpable masses. Trachea-midline. Thyroid Gland Characteristics - normal size and consistency.  Eye Eyeball - Bilateral-Extraocular movements intact. Sclera/Conjunctiva - Bilateral-No scleral icterus.  Chest and Lung Exam Chest and lung exam reveals -quiet, even and easy respiratory effort with no use of accessory muscles and on auscultation, normal breath sounds, no adventitious sounds and normal vocal resonance. Inspection Chest Wall - Normal. Back - normal.  Breast Note: There is no palpable mass in either breast. There is no palpable axillary, supraclavicular, or cervical lymphadenopathy   Cardiovascular Cardiovascular examination reveals -normal heart sounds, regular rate and rhythm with no murmurs and normal pedal pulses bilaterally.  Abdomen Inspection Inspection of the abdomen reveals - No Hernias. Skin - Scar - no surgical scars. Palpation/Percussion Palpation and Percussion of the abdomen reveal - Soft, Non Tender, No Rebound tenderness, No Rigidity (guarding) and No hepatosplenomegaly. Auscultation Auscultation of the abdomen reveals - Bowel sounds normal.  Neurologic Neurologic evaluation reveals -alert and oriented x 3 with no impairment of recent or remote memory. Mental Status-Normal.  Musculoskeletal Normal Exam - Left-Upper Extremity Strength Normal and Lower Extremity Strength Normal. Normal Exam - Right-Upper Extremity Strength Normal and Lower Extremity Strength Normal.  Lymphatic Head & Neck  General Head & Neck Lymphatics: Bilateral - Description - Normal. Axillary  General Axillary Region: Bilateral - Description - Normal. Tenderness - Non  Tender. Femoral & Inguinal  Generalized Femoral & Inguinal Lymphatics: Bilateral - Description - Normal. Tenderness - Non Tender.    Assessment & Plan  MALIGNANT NEOPLASM OF LOWER-INNER QUADRANT OF RIGHT BREAST OF FEMALE, ESTROGEN RECEPTOR POSITIVE (C50.311) Impression: The patient appears to have a 7 cm area of both ductal carcinoma in situ and invasive breast cancer in the medial aspect of the right breast with calcifications behind the nipple. I have talked to her in detail about the different options for treatment and given the large area in the location of my recommendation at this point would be for mastectomy with sentinel node biopsy. I have discussed with her in detail the risks and benefits of the operation as well as some of the technical aspects and she understands. She would be a good candidate for reconstruction so we will also refer her to plastic surgery to talk about the options. I will also refer her to medical and radiation oncology to discuss adjuvant therapy. She is also a good candidate for genetic testing given her family history. I will talk to her again once she has met with all of these doctors to finalize a plan. This patient encounter took 60 minutes today to perform the following: take history, perform exam, review outside records, interpret imaging, counsel the patient on their diagnosis and document encounter, findings & plan in the EHR Current Plans Referred to Oncology, for evaluation and follow up (Oncology). Routine. Referred to Surgery - Plastic, for evaluation and follow up (Plastic Surgery). Routine. Referred to Genetic Counseling, for evaluation and follow up [employer]). Routine.

## 2020-04-28 NOTE — Discharge Instructions (Signed)
INSTRUCTIONS FOR AFTER SURGERY   You will likely have some questions about what to expect following your operation.  The following information will help you and your family understand what to expect when you are discharged from the hospital.  Following these guidelines will help ensure a smooth recovery and reduce risks of complications.  Postoperative instructions include information on: diet, wound care, medications and physical activity.  AFTER SURGERY Expect to go home after the procedure.  In some cases, you may need to spend one night in the hospital for observation.  DIET This surgery does not require a specific diet.  However, I have to mention that the healthier you eat the better your body can start healing. It is important to increasing your protein intake.  This means limiting the foods with added sugar.  Focus on fruits and vegetables and some meat. It is very important to drink water after your surgery.  If your urine is bright yellow, then it is concentrated, and you need to drink more water.  As a general rule after surgery, you should have 8 ounces of water every hour while awake.  If you find you are persistently nauseated or unable to take in liquids let us know.  NO TOBACCO USE or EXPOSURE.  This will slow your healing process and increase the risk of a wound.  WOUND CARE Clean with baby wipes until the drain is removed or at least wait to shower for 3 days.   If you have steri-strips / tape directly attached to your skin leave them in place. It is OK to get these wet.  No baths, pools or hot tubs for two weeks. We close your incision to leave the smallest and best-looking scar. No ointment or creams on your incisions until given the go ahead.  Especially not Neosporin (Too many skin reactions with this one).  A few weeks after surgery you can use Mederma and start massaging the scar. We ask you to wear your binder or sports bra for the first 6 weeks around the clock, including  while sleeping. This provides added comfort and helps reduce the fluid accumulation at the surgery site.  ACTIVITY No heavy lifting until cleared by the doctor.  It is OK to walk and climb stairs. In fact, moving your legs is very important to decrease your risk of a blood clot.  It will also help keep you from getting deconditioned.  Every 1 to 2 hours get up and walk for 5 minutes. This will help with a quicker recovery back to normal.  Let pain be your guide so you don't do too much.  NO, you cannot do the spring cleaning and don't plan on taking care of anyone else.  This is your time for TLC.   WORK Everyone returns to work at different times. As a rough guide, most people take at least 1 - 2 weeks off prior to returning to work. If you need documentation for your job, bring the forms to your postoperative follow up visit.  DRIVING Arrange for someone to bring you home from the hospital.  You may be able to drive a few days after surgery but not while taking any narcotics or valium.  BOWEL MOVEMENTS Constipation can occur after anesthesia and while taking pain medication.  It is important to stay ahead for your comfort.  We recommend taking Milk of Magnesia (2 tablespoons; twice a day) while taking the pain pills.  SEROMA This is fluid your body tried  to put in the surgical site.  This is normal but if it creates excessive pain and swelling let us know.  It usually decreases in a few weeks.  MEDICATIONS and PAIN CONTROL At your preoperative visit for you history and physical you were given the following medications: 1. An antibiotic: Start this medication when you get home and take according to the instructions on the bottle. 2. Zofran 4 mg:  This is to treat nausea and vomiting.  You can take this every 6 hours as needed and only if needed. 3. Norco (hydrocodone/acetaminophen) 5/325 mg:  This is only to be used after you have taken the motrin or the tylenol. Every 8 hours as needed. Over  the counter Medication to take: 4. Ibuprofen (Motrin) 600 mg:  Take this every 6 hours.  If you have additional pain then take 500 mg of the tylenol.  Only take the Norco after you have tried these two. 5. Miralax or stool softener of choice: Take this according to the bottle if you take the [place] Call your surgeon's office if any of the following occur: . Fever 101 degrees F or greater . Excessive bleeding or fluid from the incision site. . Pain that increases over time without aid from the medications . Redness, warmth, or pus draining from incision sites . Persistent nausea or inability to take in liquids . Severe misshapen area that underwent the operation.  [facility] Plastic Surgery Specialist  What is the benefit of having a drain?  During surgery your tissue layers are separated.  This raw surface stimulates your body to fill the space with serous fluid.  This is normal but you don't want that fluid to collect and prevent healing.  A fluid collection can also become infected.  The Jackson-Pratt (JP) drain is used to eliminate this collection of fluid and allow the tissue to heal together.    Jackson-Pratt (JP) bulb    How to care for your drainage and suction unit at home Your drainage catheter will be connected to a collection device. The vacuum caused when the device is compressed allows drainage to collect in the device.    Wendee Copp your hands with soap and water before and after touching the system. . Empty the JP drain every 12 hours once you get home from your procedure. . Record the fluid amount on the record sheet included. . Start with stripping the drain tube to push the clots or excess fluid to the bulb.  Do this by pinching the tube with one hand near your skin.  Then with the other hand squeeze the tubing and work it toward the bulb.  This should be done several times a day.  This may collapse the tube which will correct on its own.   . Use a safety pin to  attach your collection device to your clothing so there is no tension on the insertion site.   . If you have drainage at the skin insertion site, you can apply a gauze dressing and secure it with tape. . If the drain falls out, apply a gauze dressing over the drain insertion site and secure with tape.   To empty the collection device:   . Release the stopper on the top of the collection unit (bulb).  Signa Kell contents into a measuring container such as a plastic medicine cup.  . Record the day and amount of drainage on the attached sheet. . This should be done at  least twice a day.    To compress the Jackson-Pratt Bulb:  . Release the stopper at the top of the bulb. [name] Squeeze the bulb tightly in your fist, squeezing air out of the bulb.  . Replace the stopper while the bulb is compressed.  . Be careful not to spill the contents when squeezing the bulb. . The drainage will start bright red and turn to pink and then yellow with time. . IMPORTANT: If the bulb is not squeezed before adding the stopper it will not draw out the fluid.  Care for the JP drain site and your skin daily:  . You may shower three days after surgery. . Secure the drain to a ribbon or cloth around your waist while showering so it does not pull out while showering. . Be sure your hands are cleaned with soap and water. . Use a clean wet cotton swab to clean the skin around the drain site.  . Use another cotton swab to place Vaseline or antibiotic ointment on the skin around the drain.     Contact your physician if any of the following occur:  [name] The fluid in the bulb becomes cloudy. . Your temperature is greater than 101.4.  [name] The incision opens. . If you have drainage at the skin insertion site, you can apply a gauze dressing and secure it with tape. . If the drain falls out, apply a gauze dressing over the drain insertion site and secure with tape.  . You will usually have more drainage when you are active than while you  rest or are asleep. If the drainage increases significantly or is bloody call the physician                             Bring this record with you to each office visit Date  Drainage Volume  Date   Drainage volume

## 2020-04-28 NOTE — Interval H&P Note (Signed)
History and Physical Interval Note:  04/28/2020 8:13 AM  Gail Erickson  has presented today for surgery, with the diagnosis of RIGHT BREAST CANCER.  The various methods of treatment have been discussed with the patient and family. After consideration of risks, benefits and other options for treatment, the patient has consented to  Procedure(s) with comments: RIGHT MASTECTOMY WITH SENTINEL LYMPH NODE BIOPSY (Right) - PECTORAL BLOCK IMMEDIATE RIGHT BREAST RECONSTRUCTION WITH PLACEMENT OF TISSUE EXPANDER AND FLEX HD (ACELLULAR HYDRATED DERMIS) (Right) as a surgical intervention.  The patient's history has been reviewed, patient examined, no change in status, stable for surgery.  I have reviewed the patient's chart and labs.  Questions were answered to the patient's satisfaction.     Autumn Messing III

## 2020-04-28 NOTE — Interval H&P Note (Signed)
History and Physical Interval Note:  04/28/2020 8:11 AM  Gail Erickson  has presented today for surgery, with the diagnosis of RIGHT BREAST CANCER.  The various methods of treatment have been discussed with the patient and family. After consideration of risks, benefits and other options for treatment, the patient has consented to  Procedure(s) with comments: RIGHT MASTECTOMY WITH SENTINEL LYMPH NODE BIOPSY (Right) - PECTORAL BLOCK IMMEDIATE RIGHT BREAST RECONSTRUCTION WITH PLACEMENT OF TISSUE EXPANDER AND FLEX HD (ACELLULAR HYDRATED DERMIS) (Right) as a surgical intervention.  The patient's history has been reviewed, patient examined, no change in status, stable for surgery.  I have reviewed the patient's chart and labs.  Questions were answered to the patient's satisfaction.     Loel Lofty [name]

## 2020-04-29 ENCOUNTER — Encounter (HOSPITAL_BASED_OUTPATIENT_CLINIC_OR_DEPARTMENT_OTHER): Payer: Self-pay | Admitting: General Surgery

## 2020-04-29 DIAGNOSIS — Z170 Estrogen receptor positive status [ER+]: Secondary | ICD-10-CM | POA: Diagnosis not present

## 2020-04-29 DIAGNOSIS — C50311 Malignant neoplasm of lower-inner quadrant of right female breast: Secondary | ICD-10-CM | POA: Diagnosis not present

## 2020-04-29 DIAGNOSIS — Z20822 Contact with and (suspected) exposure to covid-19: Secondary | ICD-10-CM | POA: Diagnosis not present

## 2020-04-29 DIAGNOSIS — R7303 Prediabetes: Secondary | ICD-10-CM | POA: Diagnosis not present

## 2020-04-29 DIAGNOSIS — Z791 Long term (current) use of non-steroidal anti-inflammatories (NSAID): Secondary | ICD-10-CM | POA: Diagnosis not present

## 2020-04-29 NOTE — Discharge Summary (Signed)
Physician Discharge Summary  Patient ID: Gail Erickson MRN: [id number] DOB/AGE: 46/02/1976 46 y.o.  Admit date: 04/28/2020 Discharge date: 04/29/2020  Admission Diagnoses:  Discharge Diagnoses:  Active Problems:   Breast cancer [facility])   Discharged Condition: good  Hospital Course: Patient is resting comfortably in bed.  Denies fever/chills, nausea/vomiting.  Reports pain is well controlled.  Right breast incision is intact, clean and dry.  Honeycomb dressing and Steri-Strips in place.  No signs of infection, redness, drainage, seroma/hematoma.  Drain in place with serosanguineous fluid in bulb.  Drain site shows no signs of infection, redness, drainage.  Consults: None  Significant Diagnostic Studies: Surgical specimen sent to pathology  Treatments: surgery: Right breast mastectomy with placement of tissue expander and Flex HD  Discharge Exam: Blood pressure 108/64, pulse 80, temperature 98 F (36.7 C), resp. rate 20, height 5\' 9"  (1.753 m), weight 102.5 kg, last menstrual period 04/15/2020, SpO2 100 %. General appearance: alert, cooperative and no distress Head: Normocephalic, without obvious abnormality, atraumatic Eyes: EOMs intact Resp: Nonlabored Breasts: Right breast incision is intact, clean and dry.  No signs of infection, redness, drainage, seroma/hematoma.  Honeycomb dressing and Steri-Strips in place.  Drain in place with serosanguineous fluid in bulb.  Drain site shows no signs of infection, redness, drainage.  Disposition: Discharge disposition: 01-Home or Self Care       Discharge Instructions    Call MD for:  difficulty breathing, headache or visual disturbances   Complete by: As directed    Call MD for:  extreme fatigue   Complete by: As directed    Call MD for:  hives   Complete by: As directed    Call MD for:  persistant dizziness or light-headedness   Complete by: As directed    Call MD for:  persistant nausea and vomiting   Complete by: As directed     Call MD for:  redness, tenderness, or signs of infection (pain, swelling, redness, odor or green/yellow discharge around incision site)   Complete by: As directed    Call MD for:  severe uncontrolled pain   Complete by: As directed    Call MD for:  temperature >100.4   Complete by: As directed    Diet - low sodium heart healthy   Complete by: As directed    Increase activity slowly   Complete by: As directed      Allergies as of 04/29/2020   No Known Allergies     Medication List    TAKE these medications   diazepam 2 MG tablet Commonly known as: Valium Take 1 tablet (2 mg total) by mouth every 12 (twelve) hours as needed for muscle spasms.   EXCEDRIN MIGRAINE PO Take 1 tablet by mouth as needed.   Fish Oil 1000 MG Caps Take by mouth.   ibuprofen 600 MG tablet Commonly known as: ADVIL Take 1 tablet (600 mg total) by mouth every 6 (six) hours as needed for mild pain or moderate pain. For use AFTER surgery   multivitamin tablet Take 1 tablet by mouth daily.   ondansetron 4 MG disintegrating tablet Commonly known as: ZOFRAN-ODT Take 1 tablet (4 mg total) by mouth every 8 (eight) hours as needed for nausea or vomiting.   PROBIOTIC-10 PO Take 1 tablet by mouth.   pseudoephedrine 30 MG tablet Commonly known as: SUDAFED Take 30 mg by mouth every 4 (four) hours as needed for congestion.   vitamin C 1000 MG tablet Take 1,000 mg by mouth daily.  Vitamin D3 125 MCG (5000 UT) Caps Take 1 capsule by mouth daily.       Follow-up Information    Dillingham, Loel Lofty, DO In 10 days.   Specialty: Plastic Surgery Contact information: [address] [address] [phone]        Autumn Messing III, MD In 2 weeks.   Specialty: General Surgery Contact information: [place]  [address] [phone]                Signed: Threasa Heads 04/29/2020, 7:32 AM

## 2020-04-29 NOTE — Progress Notes (Signed)
1 Day Post-Op   Subjective/Chief Complaint: No complaints other than some pressure   Objective: Vital signs in last 24 hours: Temp:  [97.9 F (36.6 C)-98.4 F (36.9 C)] 98 F (36.7 C) (02/17 0445) Pulse Rate:  [63-84] 80 (02/17 0700) Resp:  [16-25] 20 (02/17 0445) BP: (108-121)/(64-93) 108/64 (02/17 0445) SpO2:  [98 %-100 %] 100 % (02/17 0700)    Intake/Output from previous day: 02/16 0701 - 02/17 0700 In: 2600 [P.O.:1200; I.V.:1400] Out: 9373 [Urine:3300; Drains:210; Blood:25] Intake/Output this shift: No intake/output data recorded.  General appearance: alert and cooperative Resp: clear to auscultation bilaterally Chest wall: skin flaps look good Cardio: regular rate and rhythm GI: soft, non-tender; bowel sounds normal; no masses,  no organomegaly  Lab Results:  No results for input(s): WBC, HGB, HCT, PLT in the last 72 hours. BMET No results for input(s): NA, K, CL, CO2, GLUCOSE, BUN, CREATININE, CALCIUM in the last 72 hours. PT/INR No results for input(s): LABPROT, INR in the last 72 hours. ABG No results for input(s): PHART, HCO3 in the last 72 hours.  Invalid input(s): PCO2, PO2  Studies/Results: NM Sentinel Node Inj-No Rpt (Breast)  Result Date: 04/28/2020 Sulfur colloid was injected by the nuclear medicine technologist for melanoma sentinel node.    Anti-infectives: Anti-infectives (From admission, onward)   Start     Dose/Rate Route Frequency Ordered Stop   04/28/20 1400  ceFAZolin (ANCEF) IVPB 2g/100 mL premix        2 g 200 mL/hr over 30 Minutes Intravenous Every 8 hours 04/28/20 1125     04/28/20 1040  ceFAZolin 1 g / gentamicin 80 mg in NS 500 mL surgical irrigation  Status:  Discontinued          As needed 04/28/20 1112 04/28/20 1113   04/28/20 0645  ceFAZolin (ANCEF) IVPB 2g/100 mL premix        2 g 200 mL/hr over 30 Minutes Intravenous On call to O.R. 04/28/20 0642 04/28/20 0845   04/28/20 0645  ceFAZolin (ANCEF) IVPB 2g/100 mL premix         2 g 200 mL/hr over 30 Minutes Intravenous On call to O.R. 04/28/20 0643 04/29/20 0559      Assessment/Plan: s/p Procedure(s) with comments: RIGHT MASTECTOMY WITH SENTINEL LYMPH NODE BIOPSY (Right) - PECTORAL BLOCK IMMEDIATE RIGHT BREAST RECONSTRUCTION WITH PLACEMENT OF TISSUE EXPANDER AND FLEX HD (ACELLULAR HYDRATED DERMIS) (Right) Advance diet Discharge  LOS: 0 days    Autumn Messing III 04/29/2020

## 2020-04-30 LAB — SURGICAL PATHOLOGY

## 2020-05-04 ENCOUNTER — Telehealth: Payer: Self-pay | Admitting: *Deleted

## 2020-05-04 NOTE — Telephone Encounter (Signed)
Per Dr. Burr Medico no oncotype needed. Called pt to inform Dr. Burr Medico recommendations of no oncotype. Scheduled and confirmed to see Dr. Burr Medico on 2/24 at 12pm to discuss anti-estrogen.

## 2020-05-05 NOTE — Progress Notes (Signed)
[name]   [phone] [phone]   Clinic Follow up Note   Patient Care Team: Patient, No Pcp Per as PCP - General ([facility]) [name], MD as Consulting Physician (Obstetrics and Gynecology) [name], MD as Consulting Physician (General Surgery) [name], MD as Consulting Physician (Hematology) [name], RN as Oncology Nurse Navigator [name], RN as Oncology Nurse Navigator  Date of Service:  05/06/2020  CHIEF COMPLAINT: F/u of right breast cancer   SUMMARY OF ONCOLOGIC HISTORY: Oncology History Overview Note  Cancer Staging Malignant neoplasm of overlapping sites of right breast [facility]) Staging form: Breast, AJCC 8th Edition - Clinical stage from 02/25/2020: Stage IIB (cT3, cN0, cM0, G3, ER+, PR+, HER2-) - Signed by [name], MD on 02/25/2020 - Pathologic stage from 04/28/2020: Stage IA (pT1a, pN0, cM0, G2, ER+, PR+, HER2-) - Signed by [name], MD on 05/06/2020 Histologic grading system: 3 grade system    Malignant neoplasm of overlapping sites of right breast ([place])  01/30/2020 Mammogram   Mammogram 01/30/20  IMPRESSION: 1. 7.3 x 5.7 x 3.0 cm area of indeterminate calcifications in medial right breast, involving the lower inner and upper inner quadrants, these extend from the retroareolar region posteriorly, in a segmental distribution, suspicious for DCIS. 2. Two probably benign left breast masses and 2 probably benign left axillary lymph nodes with borderline cortical thickening. 3. Benign right breast cyst.   02/13/2020 Initial Biopsy   Diagnosis 02/13/20 1. Breast, right, needle core biopsy, posterior extent - DUCTAL CARCINOMA IN SITU WITH CALCIFICATIONS AND NECROSIS - SEE COMMENT 2. Breast, right, needle core biopsy, mid-anterior extent - INVASIVE DUCTAL CARCINOMA - DUCTAL CARCINOMA IN SITU - SEE COMMENT Microscopic Comment 1. Based on the biopsy, the ductal carcinoma in situ has  a solid pattern, high nuclear grade and measures 0.5 cm in greatest linear extent. Prognostic markers (ER/PR) are pending and will be reported in an addendum. Dr. Jeannie Done reviewed the case and agrees with the above diagnosis. These results were called to [facility] on February 16, 2020. 2. Based on the biopsy, the carcinoma appears Nottingham grade 3 of 3 and measures 0.15 cm in greatest linear extent. Prognostic markers (ER/PR/ki-67/HER2) are pending and will be reported in an addendum.   02/13/2020 Receptors her2    1. PROGNOSTIC INDICATORS Results: IMMUNOHISTOCHEMICAL AND MORPHOMETRIC ANALYSIS PERFORMED MANUALLY Estrogen Receptor: >95%, POSITIVE, MODERATE-STRONG STAINING INTENSITY Progesterone Receptor: 5%, POSITIVE, MODERATE-STRONG STAINING INTENSITY  2. PROGNOSTIC INDICATORS Results: IMMUNOHISTOCHEMICAL AND MORPHOMETRIC ANALYSIS PERFORMED MANUALLY The tumor cells are NEGATIVE for Her2 (0). Estrogen Receptor: >95%, POSITIVE, STRONG STAINING INTENSITY Progesterone Receptor: 60%, POSITIVE, STRONG STAINING INTENSITY Proliferation Marker Ki67: 1%   02/21/2020 Breast MRI   Breast MRI 02/21/20  IMPRESSION: 1. Involving the sites of biopsy proven DCIS in the medial right breast there is a large area of ill-defined non mass enhancement middle to anterior depth, somewhat difficult to measure due to irregular margins and background enhancement but measures at least 6.3 x 3.3 x 2.0 cm. 2. No MRI evidence of malignancy in the left breast. 3. No axillary adenopathy.   02/25/2020 Initial Diagnosis   Malignant neoplasm of overlapping sites of right breast ([place])   02/25/2020 Cancer Staging   Staging form: Breast, AJCC 8th Edition - Clinical stage from 02/25/2020: Stage IIB (cT3, cN0, cM0, G3, ER+, PR+, HER2-) - Signed by [name], MD on 02/25/2020   02/27/2020 Pathology Results   Diagnosis Lymph node,  needle/core biopsy, axillary - NO CARCINOMA IDENTIFIED IN NODAL  TISSUE Microscopic Comment These results were called to [facility] on March 09, 2020.   04/28/2020 Surgery   RIGHT MASTECTOMY WITH SENTINEL LYMPH NODE BIOPSY by DR Marlou Starks  IMMEDIATE RIGHT BREAST RECONSTRUCTION WITH PLACEMENT OF TISSUE EXPANDER AND FLEX HD (ACELLULAR HYDRATED DERMIS) by Dr Marla Roe   04/28/2020 Pathology Results   FINAL MICROSCOPIC DIAGNOSIS:   A. 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 LYMPH NODE, RIGHT AXILLARY, SENTINEL, EXCISION:  -  No carcinoma identified in one lymph node (0/1)    04/28/2020 Cancer Staging   Staging form: Breast, AJCC 8th Edition - Pathologic stage from 04/28/2020: Stage IA (pT1a, pN0, cM0, G2, ER+, PR+, HER2-) - Signed by [name], MD on 05/06/2020 Histologic grading system: 3 grade system      CURRENT THERAPY:  Pending Tamoxifen   INTERVAL HISTORY:  Gail Erickson is here for a follow up. She presents to the clinic with her brother. She underwent right breast mastectomy and tissue expander placement on April 28, 2020.  She tolerated surgery well, and is recovering as expected.  She still has a draining tube in, pain is tolerable.  No discharge or tenderness at his incision.  Her appetite is normal, no other new complains.   All other systems were reviewed with the patient and are negative.  MEDICAL HISTORY:  Past Medical History:  Diagnosis Date  . Abnormal Pap smear of cervix 06-02-14   LGSIL:neg HR HPV (1st abn. pap)  . Cyst of right breast    --stable in 2015 U/S done in [place], [place]  . Elevated DHEA ([place]) 05/21/15   level = 343  . Family history of breast cancer   . 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 Migraine without  aura   . Pre-diabetes   . Prediabetes 2021   sees Dr.Balan  . Vitamin D deficiency 05/21/15   level = 10    SURGICAL HISTORY: Past Surgical History:  Procedure Laterality Date  . BREAST RECONSTRUCTION WITH PLACEMENT OF TISSUE EXPANDER AND FLEX HD (ACELLULAR HYDRATED DERMIS) Right 04/28/2020   Procedure: IMMEDIATE RIGHT BREAST RECONSTRUCTION WITH PLACEMENT OF TISSUE EXPANDER AND FLEX HD (ACELLULAR HYDRATED DERMIS);  Surgeon: Wallace Going, DO;  Location: [facility];  Service: Plastics;  Laterality: Right;  . MASTECTOMY W/ SENTINEL NODE BIOPSY Right 04/28/2020   Procedure: RIGHT MASTECTOMY WITH SENTINEL LYMPH NODE BIOPSY;  Surgeon: [name], MD;  Location: [facility];  Service: General;  Laterality: Right;  PECTORAL BLOCK  . UMBILICAL HERNIA REPAIR     --as a child    I have reviewed the social history and family history with the patient and they are unchanged from previous note.  ALLERGIES:  has No Known Allergies.  MEDICATIONS:  Current Outpatient Medications  Medication Sig Dispense Refill  . tamoxifen (NOLVADEX) 20 MG tablet Take 1 tablet (20 mg total) by mouth daily. 30 tablet 3  . Ascorbic Acid (VITAMIN C) 1000 MG tablet Take 1,000 mg by mouth daily.    . Aspirin-Acetaminophen-Caffeine (EXCEDRIN MIGRAINE PO) Take 1 tablet by mouth as  needed.    . Cholecalciferol (VITAMIN D3) 125 MCG (5000 UT) CAPS Take 1 capsule by mouth daily.    . diazepam (VALIUM) 2 MG tablet Take 1 tablet (2 mg total) by mouth every 12 (twelve) hours as needed for muscle spasms. 20 tablet 0  . ibuprofen (ADVIL) 600 MG tablet Take 1 tablet (600 mg total) by mouth every 6 (six) hours as needed for mild pain or moderate pain. For use AFTER surgery 30 tablet 0  . Multiple Vitamin (MULTIVITAMIN) tablet Take 1 tablet by mouth daily.    . Omega-3 Fatty Acids (FISH OIL) 1000 MG CAPS Take by mouth.    . ondansetron (ZOFRAN-ODT) 4 MG disintegrating tablet Take 1 tablet (4 mg  total) by mouth every 8 (eight) hours as needed for nausea or vomiting. 20 tablet 0  . Probiotic Product (PROBIOTIC-10 PO) Take 1 tablet by mouth.    . pseudoephedrine (SUDAFED) 30 MG tablet Take 30 mg by mouth every 4 (four) hours as needed for congestion.     No current facility-administered medications for this visit.    PHYSICAL EXAMINATION: ECOG PERFORMANCE STATUS: 1 - Symptomatic but completely ambulatory  Vitals:   05/06/20 1219  BP: 128/82  Pulse: 64  Resp: 16  Temp: 99.6 F (37.6 C)  SpO2: 100%   Filed Weights   05/06/20 1219  Weight: 230 lb 3.2 oz (104.4 kg)    GENERAL:alert, no distress and comfortable SKIN: skin color, texture, turgor are normal, no rashes or significant lesions EYES: normal, Conjunctiva are pink and non-injected, sclera clear NECK: supple, thyroid normal size, non-tender, without nodularity LYMPH:  no palpable lymphadenopathy in the cervical, axillary  LUNGS: clear to auscultation and percussion with normal breathing effort HEART: regular rate & rhythm and no murmurs and no lower extremity edema ABDOMEN:abdomen soft, non-tender and normal bowel sounds Musculoskeletal:no cyanosis of digits and no clubbing  NEURO: alert & oriented x 3 with fluent speech, no focal motor/sensory deficits Breasts: Breast inspection showed status post right mastectomy and tissue expander placement.  The incision is healing well, no discharge or skin erythema. Palpation of the left breast and axilla revealed no obvious mass that I could appreciate.   LABORATORY DATA:  I have reviewed the data as listed CBC Latest Ref Rng & Units 02/26/2020 06/02/2019 04/29/2018  WBC 4.0 - 10.5 K/uL 4.2 5.3 6.8  Hemoglobin 12.0 - 15.0 g/dL 11.8(L) 11.6 12.2  Hematocrit 36.0 - 46.0 % 34.4(L) 35.8 36.4  Platelets 150 - 400 K/uL 304 314 301     CMP Latest Ref Rng & Units 02/26/2020 06/02/2019 04/29/2018  Glucose 70 - 99 mg/dL 96 85 94  BUN 6 - 20 mg/dL $Remove'13 6 6  'ATIUHQI$ Creatinine 0.44 - 1.00  mg/dL 1.03(H) 0.98 0.90  Sodium 135 - 145 mmol/L 141 139 140  Potassium 3.5 - 5.1 mmol/L 4.1 3.8 3.7  Chloride 98 - 111 mmol/L 107 103 105  CO2 22 - 32 mmol/L $RemoveB'26 23 22  'HVZDcIQM$ Calcium 8.9 - 10.3 mg/dL 9.3 9.2 8.9  Total Protein 6.5 - 8.1 g/dL 7.6 7.1 7.1  Total Bilirubin 0.3 - 1.2 mg/dL 1.1 0.7 0.7  Alkaline Phos 38 - 126 U/L 57 56 58  AST 15 - 41 U/L $Remo'18 29 21  'bXnPE$ ALT 0 - 44 U/L $Remo'18 20 17      'qkMwn$ RADIOGRAPHIC STUDIES: I have personally reviewed the radiological images as listed and agreed with the findings in the report. No results found.   ASSESSMENT & PLAN:  [place]  Alumbaugh is a 45 y.o. female with    1. 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 Separate right Axillary LN biopsy was negative.  -She underwent right mastectomy and reconstruction surgery with Dr Marlou Starks and Dr Marla Roe on 04/28/20. She is recovering from surgery  -I reviewed her surgical pathology results with patient and her brother in detail.  Her invasive ductal carcinoma was 0.5 cm, grade 2, margins were negative, 1 lymph nodes were negative.  Her surgical stage is IA.  -Given the T1a tumor, she does not need Oncotype or adjuvant chemotherapy. -I do not think she needs postmastectomy radiation ---Giving her strongly ER and PR positivity of the tumor cells, and her pre-menopause status, I recommend adjuvant endocrine therapy with tamoxifen. The potential side effects, which includes but not limited to, hot flash, skin and vaginal dryness, slightly increased risk of cardiovascular disease and cataract, small risk of thrombosis and endometrial cancer, were discussed with her in great details. Preventive strategies for thrombosis, such as being physically active, using  compression stocks, avoid cigarette smoking, etc., were reviewed with her. I also recommend her to follow-up with her gynecologist once a year, and watch for vaginal spotting or bleeding, as a clinically sign of endometrial cancer, etc. She voiced good understanding, and agrees to proceed. Will start in 3-4 weeks when she recovers well from surgery  -Alternative adjuvant endocrine therapy, such as aromatase inhibitor and overexpression were discussed with her also, given her very early stage disease and low risk of recurrence, I do not strongly feel this is necessary. -We also reviewed the breast cancer surveillance, including annual mammogram, and physical exam.  -will f/u her in 3 months   2. Family history, Genetic testing negative for BRCA 1/2 -She has an extensive family history of cancer, mainly breast cancer on both sides of her family.    3. Pre-Diabetic, Stress -She is seen by Endocrinologist, not on medication  -She is seen by Therapist as needed, not on medication.    PLAN:  -Surgical pathology reviewed with patient -I called in tamoxifen 20 mg daily today, she will start in 3 to 4 weeks when she recovers well from surgery. -Follow-up in 3 months   No problem-specific Assessment & Plan notes found for this encounter.   No orders of the defined types were placed in this encounter.  All questions were answered. The patient knows to call the clinic with any problems, questions or concerns. No barriers to learning was detected. The total time spent in the appointment was 30 minutes.     [name], MD 05/06/2020   I, Joslyn Devon, am acting as scribe for [name], MD.   I have reviewed the above documentation for accuracy and completeness, and I agree with the above.

## 2020-05-06 ENCOUNTER — Inpatient Hospital Stay: Payer: BC Managed Care – PPO | Attending: Hematology | Admitting: Hematology

## 2020-05-06 ENCOUNTER — Telehealth: Payer: Self-pay | Admitting: Hematology

## 2020-05-06 ENCOUNTER — Other Ambulatory Visit: Payer: Self-pay

## 2020-05-06 ENCOUNTER — Encounter: Payer: Self-pay | Admitting: Hematology

## 2020-05-06 VITALS — BP 128/82 | HR 64 | Temp 99.6°F | Resp 16 | Ht 69.0 in | Wt 230.2 lb

## 2020-05-06 DIAGNOSIS — C50811 Malignant neoplasm of overlapping sites of right female breast: Secondary | ICD-10-CM

## 2020-05-06 DIAGNOSIS — Z170 Estrogen receptor positive status [ER+]: Secondary | ICD-10-CM | POA: Insufficient documentation

## 2020-05-06 DIAGNOSIS — R7303 Prediabetes: Secondary | ICD-10-CM | POA: Insufficient documentation

## 2020-05-06 MED ORDER — TAMOXIFEN CITRATE 20 MG PO TABS: 20.0000 mg | ORAL_TABLET | Freq: Every day | ORAL | 3 refills | Status: DC

## 2020-05-06 NOTE — Telephone Encounter (Signed)
Scheduled appointments per 2/24 los. Spoke to patient who is aware of appointments date and times.

## 2020-05-07 ENCOUNTER — Encounter: Payer: Self-pay | Admitting: Plastic Surgery

## 2020-05-07 ENCOUNTER — Other Ambulatory Visit: Payer: Self-pay

## 2020-05-07 ENCOUNTER — Encounter: Payer: Self-pay | Admitting: *Deleted

## 2020-05-07 ENCOUNTER — Ambulatory Visit (INDEPENDENT_AMBULATORY_CARE_PROVIDER_SITE_OTHER): Payer: BC Managed Care – PPO | Admitting: Plastic Surgery

## 2020-05-07 VITALS — BP 116/78 | HR 68

## 2020-05-07 DIAGNOSIS — C50811 Malignant neoplasm of overlapping sites of right female breast: Secondary | ICD-10-CM

## 2020-05-07 MED ORDER — DIAZEPAM 2 MG PO TABS: 2.0000 mg | ORAL_TABLET | Freq: Two times a day (BID) | ORAL | 0 refills | Status: AC | PRN

## 2020-05-07 NOTE — Progress Notes (Signed)
The patient is a 45 year old female here with her brother for follow-up on her right breast surgery.  She had a right breast mastectomy with reconstruction.  She had a [place] 535 cc expander placed with 250 in the expander.  She is doing well and her pain is well controlled.  No sign of redness or infection.  Tenderness and swelling as expected.  We did a fill today so she would like a refill on her Valium and I have sent that to her pharmacy.  We will likely be able to remove the drain next week.  We placed injectable saline in the Expander using a sterile technique: Right: 50 cc for a total of 300 / 535 cc

## 2020-05-12 DIAGNOSIS — F4320 Adjustment disorder, unspecified: Secondary | ICD-10-CM | POA: Diagnosis not present

## 2020-05-14 ENCOUNTER — Telehealth: Payer: Self-pay

## 2020-05-14 ENCOUNTER — Other Ambulatory Visit: Payer: Self-pay

## 2020-05-14 ENCOUNTER — Encounter: Payer: Self-pay | Admitting: Surgical

## 2020-05-14 ENCOUNTER — Ambulatory Visit (INDEPENDENT_AMBULATORY_CARE_PROVIDER_SITE_OTHER): Payer: BC Managed Care – PPO | Admitting: Surgical

## 2020-05-14 VITALS — BP 117/74 | HR 65

## 2020-05-14 DIAGNOSIS — C50811 Malignant neoplasm of overlapping sites of right female breast: Secondary | ICD-10-CM

## 2020-05-14 NOTE — Telephone Encounter (Signed)
Patient called to say that the letter we wrote has her returning to work on 05/19/2020.  Patient feels like she needs to be out of work longer, possibly until 06/02/2020.  Please advise.

## 2020-05-14 NOTE — Progress Notes (Signed)
Patient is a 45 year old female here for follow-up on her right breast reconstruction.  She reports she tolerated her last fill without any issues.  She is 2 weeks postop.  She reports she has used Valium which has been helpful.  On exam her incision is intact, no sign of redness or infection.  Swelling is improved.  Right JP drain in place without any surrounding erythema.  No cellulitic changes noted.  No foul odor is noted.  Recommend continue her compressive garment 24/7.  Recommend avoiding strenuous activity or heavy lifting for total of 6 weeks after surgery.  Recommend using Valium as needed.  Recommend avoiding driving while taking Valium.  We placed injectable saline in the Expander using a sterile technique: Right: 50 cc for a total of 350 / 535 cc  No sign of infection, seroma, hematoma.  Recommend following up in 2 weeks for reevaluation/additional fill.

## 2020-05-17 NOTE — Telephone Encounter (Signed)
Patient can return to work on 05/31/20. Please write a letter stating return to work 05/31/20 with restrictions of no lifting > 15lbs until 6 weeks after surgery (06/09/20)

## 2020-05-19 DIAGNOSIS — F4320 Adjustment disorder, unspecified: Secondary | ICD-10-CM | POA: Diagnosis not present

## 2020-05-21 ENCOUNTER — Encounter: Payer: Self-pay | Admitting: Surgical

## 2020-05-26 ENCOUNTER — Ambulatory Visit (INDEPENDENT_AMBULATORY_CARE_PROVIDER_SITE_OTHER): Payer: BC Managed Care – PPO | Admitting: Surgical

## 2020-05-26 ENCOUNTER — Encounter: Payer: Self-pay | Admitting: Surgical

## 2020-05-26 ENCOUNTER — Other Ambulatory Visit: Payer: Self-pay

## 2020-05-26 VITALS — BP 131/81 | HR 70

## 2020-05-26 DIAGNOSIS — C50811 Malignant neoplasm of overlapping sites of right female breast: Secondary | ICD-10-CM

## 2020-05-26 NOTE — Progress Notes (Addendum)
Patient is a 45 year old female here for follow-up on her right breast reconstruction.  She underwent right mastectomy followed by placement of right breast tissue expander on 04/28/2020.  She is 1 month postop.  Patient reports overall she is doing well, she does report that she had pain for 2 days after her last fill.  She is not having any infectious symptoms she reports she still having numbness over her right breast.  She has been doing Vaseline gauze over the JP drain insertion site.  She reports that she is scheduled to return to work next week, however reports that she does not feel as if this is something she can do given the pain she is experiencing.  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  She reports that she is having some tenderness over her right back  Chaperone present on exam On exam right breast incision intact, no erythema noted.  No cellulitic changes are noted.  Right JP drain insertion site is healing nicely.  She has good shape of the right breast. Discontinues noted on the right mid back.  Recommend 2 more weeks of avoiding strenuous activities or heavy lifting.  Recommend continue her compressive garment 24/7 for a few more weeks.  She can begin with physical therapy in 2 weeks to help with improving range of motion and strengthening.  We discussed some exercises she can do at home at this point to help increase her range of motion and decrease her stiffness.  We placed injectable saline in the Expander using a sterile technique: Right: 50 cc for a total of 400 / 535 cc There is no sign of infection, seroma, hematoma  Recommend patient send [place] forms for [place] to complete in order for her to remain out of work until 06/14/2020.  Recommend following up in 1 to 2 weeks for reevaluation and additional fill.

## 2020-06-02 DIAGNOSIS — F4320 Adjustment disorder, unspecified: Secondary | ICD-10-CM | POA: Diagnosis not present

## 2020-06-03 ENCOUNTER — Ambulatory Visit: Payer: BC Managed Care – PPO | Admitting: Rehabilitation

## 2020-06-08 ENCOUNTER — Ambulatory Visit: Payer: BC Managed Care – PPO | Attending: General Surgery

## 2020-06-08 ENCOUNTER — Other Ambulatory Visit: Payer: Self-pay

## 2020-06-08 DIAGNOSIS — R293 Abnormal posture: Secondary | ICD-10-CM | POA: Diagnosis not present

## 2020-06-08 DIAGNOSIS — Z170 Estrogen receptor positive status [ER+]: Secondary | ICD-10-CM | POA: Insufficient documentation

## 2020-06-08 DIAGNOSIS — M25511 Pain in right shoulder: Secondary | ICD-10-CM | POA: Diagnosis not present

## 2020-06-08 DIAGNOSIS — Z483 Aftercare following surgery for neoplasm: Secondary | ICD-10-CM | POA: Insufficient documentation

## 2020-06-08 DIAGNOSIS — C50411 Malignant neoplasm of upper-outer quadrant of right female breast: Secondary | ICD-10-CM | POA: Insufficient documentation

## 2020-06-08 NOTE — Patient Instructions (Signed)
Access Code: [email or web address] URL: [email or web address] Date: 06/08/2020 Prepared by: Cheral Almas  Exercises Supine Shoulder Flexion Extension AAROM with Dowel - 1 x daily - 7 x weekly - 1 sets - 5 reps - 5 hold   Also with hands further apart in scaption  X 5

## 2020-06-08 NOTE — Therapy (Signed)
[place], [place], [address] Phone: [phone]   Fax:  [phone]  Physical Therapy Treatment  Patient Details  Name: Gail Erickson MRN: [id number] Date of Birth: [birth year] Referring Provider (PT): Dr. Marlou Starks   Encounter Date: 06/08/2020   PT End of Session - 06/08/20 1357    Visit Number 2    Number of Visits 14    Date for PT Re-Evaluation 07/20/20    PT Start Time 1300    PT Stop Time 1353    PT Time Calculation (min) 53 min    Activity Tolerance Patient tolerated treatment well    Behavior During Therapy [facility] for tasks assessed/performed           Past Medical History:  Diagnosis Date  . Abnormal Pap smear of cervix 06-02-14   LGSIL:neg HR HPV (1st abn. pap)  . Cyst of right breast    --stable in 2015 U/S done in [place], [place]  . Elevated DHEA ([place]) 05/21/15   level = 343  . Family history of breast cancer   . 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 Migraine without aura   . Pre-diabetes   . Prediabetes 2021   sees Dr.Balan  . Vitamin D deficiency 05/21/15   level = 10    Past Surgical History:  Procedure Laterality Date  . BREAST RECONSTRUCTION WITH PLACEMENT OF TISSUE EXPANDER AND FLEX HD (ACELLULAR HYDRATED DERMIS) Right 04/28/2020   Procedure: IMMEDIATE RIGHT BREAST RECONSTRUCTION WITH PLACEMENT OF TISSUE EXPANDER AND FLEX HD (ACELLULAR HYDRATED DERMIS);  Surgeon: Wallace Going, DO;  Location: [place];  Service: Plastics;  Laterality: Right;  . MASTECTOMY W/ SENTINEL NODE BIOPSY Right 04/28/2020   Procedure: RIGHT MASTECTOMY WITH SENTINEL LYMPH NODE BIOPSY;  Surgeon: [name], MD;  Location: [place];  Service: General;  Laterality: Right;  PECTORAL BLOCK  . UMBILICAL HERNIA REPAIR     --as a child    There were no vitals filed for this visit.   Subjective Assessment - 06/08/20 1257    Subjective Pt. is s/p Right  mastectomy with SLNB and placement of tissue expanders on 04/28/2020.  She had her most recent fill on 05/26/2020 and has another this Friday.  Still has alot of discomfort from the expander. Driving longer distances has been a little tough.  I feel like I am letting my left arm take over some. No present pain but took some ibuprofen and tylenol before coming.  Drain was removed on March 4. 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 Feel like I may be swelling under my arm. Will start Tamoxifen some time this week.    Pertinent History December 6th learned of Cancer from a biopsy done on Dec 3.  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  Pt had right mastectomy with SLNB and tissue expanders on 04/28/2020    Patient Stated Goals Reassess right UE post surgery.    Currently in Pain? No/denies              [facility] PT Assessment - 06/08/20 0001      Assessment   Medical Diagnosis S/p Right mastectomy    Referring Provider (PT) Dr. Marlou Starks    Onset Date/Surgical Date 04/28/20    Hand Dominance Right      Precautions   Precaution Comments will have lymphedema precautions      Observation/Other Assessments   Observations incision  healing;glue still present over medial incision      Observation/Other Assessments-Edema    Edema --   ? mild edema inferior to right axilla     Sensation   Light Touch Impaired by gross assessment      Posture/Postural Control   Posture/Postural Control Postural limitations    Postural Limitations Rounded Shoulders;Forward head      AROM   Right Shoulder Extension 58 Degrees    Right Shoulder Flexion 123 Degrees    Right Shoulder ABduction 107 Degrees    Right Shoulder External Rotation 75 Degrees      Palpation   Palpation comment tender right pectorals/lats             LYMPHEDEMA/ONCOLOGY QUESTIONNAIRE - 06/08/20 0001      Type   Cancer Type DCIS      Surgeries   Lumpectomy Date 04/28/20    Sentinel Lymph Node Biopsy Date 04/28/20      Treatment    Active Chemotherapy Treatment No    Past Chemotherapy Treatment No    Active Radiation Treatment No    Past Radiation Treatment No    Current Hormone Treatment Yes   starts this week   Drug Name Tamoxifen      What other symptoms do you have   Are you Having Heaviness or Tightness Yes    Are you having Pain Yes   right upper quarter   Are you having pitting edema No    Is it Hard or Difficult finding clothes that fit No    Do you have infections No      Right Upper Extremity Lymphedema   15 cm Proximal to Olecranon Process 36.4 cm    10 cm Proximal to Olecranon Process 35.4 cm    Olecranon Process 29.5 cm    15 cm Proximal to Ulnar Styloid Process 26.9 cm    10 cm Proximal to Ulnar Styloid Process 22.7 cm    Just Proximal to Ulnar Styloid Process 17.2 cm    At Base of 2nd Digit 6.1 cm              Quick Dash - 06/08/20 0001    Open a tight or new jar Mild difficulty    Do heavy household chores (wash walls, wash floors) Moderate difficulty    Carry a shopping bag or briefcase Mild difficulty    Wash your back Moderate difficulty    Use a knife to cut food No difficulty    Recreational activities in which you take some force or impact through your arm, shoulder, or hand (golf, hammering, tennis) Moderate difficulty    During the past week, to what extent has your arm, shoulder or hand problem interfered with your normal social activities with family, friends, neighbors, or groups? Slightly    During the past week, to what extent has your arm, shoulder or hand problem limited your work or other regular daily activities Slightly    Arm, shoulder, or hand pain. Moderate    Tingling (pins and needles) in your arm, shoulder, or hand Moderate    Difficulty Sleeping Moderate difficulty    DASH Score 36.36 %                  [facility] Adult PT Treatment/Exercise - 06/08/20 0001      Shoulder Exercises: Pulleys   Flexion 2 minutes    Flexion Limitations therapist demo     Scaption 1 minute    ABduction 1  minute                  PT Education - 06/08/20 1346    Education Details Pt. was educated in  4 post op exercises and supine wand for flexion and scaption.    Person(s) Educated Patient    Methods Explanation;Handout    Comprehension Returned demonstration               PT Long Term Goals - 06/08/20 1409      PT LONG TERM GOAL #1   Title Pt will be independent with HEP to increase right shoulder ROM and strength    Time 6    Period Weeks    Status New    Target Date 07/20/20      PT LONG TERM GOAL #2   Title Pt will have right shoulder flexion and abduction within 10 degrees of baseline    Baseline abd 175, flex 165    Time 6    Period Weeks    Status New    Target Date 07/20/20      PT LONG TERM GOAL #3   Title Pts quick dash will be no greater than 10%    Baseline 36%    Time 6    Period Weeks    Status New    Target Date 07/20/20      PT LONG TERM GOAL #4   Title Pt will be fit for compression garments prn (sleeve,bra)    Time 6    Period Weeks    Status New    Target Date 07/20/20      PT LONG TERM GOAL #5   Title Pt will have decreased pain/tightness by greater than 50%    Time 6    Period Weeks    Status New    Target Date 07/20/20                 Plan - 06/08/20 1359    Clinical Impression Statement Pt is now s/p Right mastectomy with SLNB and immediate tissue expanders.  She had drains removed March 4 and was not allowed to exercise until March 16th.  She presents with limitations in A/PROM of the right shoulder and limitations in function with a Quick dash score of 36%.  Incision is healing well with glue still present at medial incision.  She has mild edema inferior to axilla. Mild tenderness at pecs and axillary border.  She will benefit from skilled PT to address deficits and return to PLOF.    Personal Factors and Comorbidities Comorbidity 1    Comorbidities Right breast CA with expanders     Examination-Activity Limitations Reach Overhead;Sleep;Dressing    Examination-Participation Restrictions Driving    Stability/Clinical Decision Making Stable/Uncomplicated    Rehab Potential Excellent    PT Frequency 2x / week    PT Duration 6 weeks    PT Treatment/Interventions ADLs/Self Care Home Management;Therapeutic exercise;Manual techniques;Patient/family education;Manual lymph drainage;Scar mobilization;Passive range of motion    PT Next Visit Plan AAROM, STW pecs/lats, MFR prn, PROM, pulleys, check swelling    PT Home Exercise Plan 4 post op exercises, supine wand flex/scaption    Recommended Other Services sleeve?    Consulted and Agree with Plan of Care Patient           Patient will benefit from skilled therapeutic intervention in order to improve the following deficits and impairments:  Decreased activity tolerance,Decreased knowledge of precautions,Decreased range of motion,Decreased strength,Decreased scar mobility,Increased  edema,Impaired sensation,Postural dysfunction,Impaired UE functional use,Pain  Visit Diagnosis: Malignant neoplasm of upper-outer quadrant of right breast in female, estrogen receptor positive ([place])  Acute pain of right shoulder  Aftercare following surgery for neoplasm  Abnormal posture     Problem List Patient Active Problem List   Diagnosis Date Noted  . Genetic testing 03/08/2020  . Malignant neoplasm of overlapping sites of right breast ([place]) 02/25/2020  . Family history of breast cancer     Claris Pong 06/08/2020, 2:28 PM  [place], [place], [address] Phone: [phone]   Fax:  [phone]  Name: Gearline Spilman MRN: [id number] Date of Birth: [birth year] Cheral Almas, PT 06/08/20 2:30 PM

## 2020-06-11 ENCOUNTER — Other Ambulatory Visit: Payer: Self-pay

## 2020-06-11 ENCOUNTER — Ambulatory Visit (INDEPENDENT_AMBULATORY_CARE_PROVIDER_SITE_OTHER): Payer: BC Managed Care – PPO | Admitting: Surgical

## 2020-06-11 ENCOUNTER — Encounter: Payer: Self-pay | Admitting: Surgical

## 2020-06-11 VITALS — BP 110/74 | HR 74

## 2020-06-11 DIAGNOSIS — C50811 Malignant neoplasm of overlapping sites of right female breast: Secondary | ICD-10-CM

## 2020-06-11 MED ORDER — DIAZEPAM 2 MG PO TABS: 2.0000 mg | ORAL_TABLET | Freq: Two times a day (BID) | ORAL | 0 refills | Status: DC | PRN

## 2020-06-11 NOTE — Progress Notes (Signed)
Patient is a 49-year female here for follow-up on her right breast reconstruction.  She underwent placement of right breast tissue expander on 04/28/2020.  She reports overall she is doing really well.  She reports she tolerated her last fill without any issues.  She had very minimal pain.  She reports she has been using the Valium as needed and reports she needs an additional prescription today.  She is not having any infectious symptoms.  Chaperone present on exam On exam right breast incision is intact, mastectomy flap is viable with good color.  Incision is healing nicely.  She does have some scarring causing a divot along the left inferior medial border.  There is no erythema.  No cellulitic changes.  No fluid wave noted with palpation.  We placed injectable saline in the Expander using a sterile technique: Right: 75 cc for a total of 475 / 535 cc  She is planning to return to work next week, I discussed with her if she has any issues with this to please let us know and we can discuss restrictions or other options. There is no sign of infection, seroma, hematoma. We discussed we wait at least 3 months after initial surgery until exchange.  She is doing really well with expansion.  There is no signs of concern at this time.  She is doing great.  Recommend following up in 2 weeks for reevaluation

## 2020-06-14 ENCOUNTER — Ambulatory Visit: Payer: BC Managed Care – PPO | Admitting: Surgical

## 2020-06-15 ENCOUNTER — Ambulatory Visit: Payer: BC Managed Care – PPO | Attending: General Surgery | Admitting: Rehabilitation

## 2020-06-15 ENCOUNTER — Other Ambulatory Visit: Payer: Self-pay

## 2020-06-15 ENCOUNTER — Encounter: Payer: Self-pay | Admitting: Rehabilitation

## 2020-06-15 DIAGNOSIS — Z483 Aftercare following surgery for neoplasm: Secondary | ICD-10-CM | POA: Insufficient documentation

## 2020-06-15 DIAGNOSIS — Z170 Estrogen receptor positive status [ER+]: Secondary | ICD-10-CM | POA: Insufficient documentation

## 2020-06-15 DIAGNOSIS — C50411 Malignant neoplasm of upper-outer quadrant of right female breast: Secondary | ICD-10-CM | POA: Diagnosis not present

## 2020-06-15 DIAGNOSIS — R293 Abnormal posture: Secondary | ICD-10-CM | POA: Diagnosis not present

## 2020-06-15 DIAGNOSIS — M25511 Pain in right shoulder: Secondary | ICD-10-CM

## 2020-06-15 NOTE — Therapy (Signed)
[place], [place], [address] Phone: [phone]   Fax:  [phone]  Physical Therapy Treatment  Patient Details  Name: Gail Erickson MRN: [id number] Date of Birth: 1975/12/15 Referring Provider (PT): Dr. Marlou Starks   Encounter Date: 06/15/2020   PT End of Session - 06/15/20 1652    Visit Number 3    Number of Visits 14    Date for PT Re-Evaluation 07/20/20    PT Start Time 1602    PT Stop Time 1642    PT Time Calculation (min) 40 min    Activity Tolerance Patient tolerated treatment well    Behavior During Therapy Gail Erickson for tasks assessed/performed           Past Medical History:  Diagnosis Date  . Abnormal Pap smear of cervix 06-02-14   LGSIL:neg HR HPV (1st abn. pap)  . Cyst of right breast    --stable in 2015 U/S done in [place], [place]  . Elevated DHEA ([place]) 05/21/15   level = 343  . Family history of breast cancer   . 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 Migraine without aura   . Pre-diabetes   . Prediabetes 2021   sees Dr.Balan  . Vitamin D deficiency 05/21/15   level = 10    Past Surgical History:  Procedure Laterality Date  . BREAST RECONSTRUCTION WITH PLACEMENT OF TISSUE EXPANDER AND FLEX HD (ACELLULAR HYDRATED DERMIS) Right 04/28/2020   Procedure: IMMEDIATE RIGHT BREAST RECONSTRUCTION WITH PLACEMENT OF TISSUE EXPANDER AND FLEX HD (ACELLULAR HYDRATED DERMIS);  Surgeon: Wallace Going, DO;  Location: [place];  Service: Plastics;  Laterality: Right;  . MASTECTOMY W/ SENTINEL NODE BIOPSY Right 04/28/2020   Procedure: RIGHT MASTECTOMY WITH SENTINEL LYMPH NODE BIOPSY;  Surgeon: [name], MD;  Location: [place];  Service: General;  Laterality: Right;  PECTORAL BLOCK  . UMBILICAL HERNIA REPAIR     --as a child    There were no vitals filed for this visit.   Subjective Assessment - 06/15/20 1601    Subjective I am feeling more  comfortable using my arm.  I am still apprehesive though.  I had the fill last friday.    Pertinent History December 6th learned of Cancer from a biopsy done on Dec 3.  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 Did gene testing today, and having a biopsy on left breast tomorrow. Presently planning for right breast mastectomy but awaiting gene testing results. Pt had right mastectomy with SLNB and tissue expanders on 04/28/2020    Currently in Pain? No/denies                             [name] - 06/15/20 0001      Exercises   Exercises Shoulder      Shoulder Exercises: Pulleys   Flexion 2 minutes    ABduction 2 minutes      Shoulder Exercises: Therapy Ball   Flexion Both;5 reps    ABduction 5 reps;Right      Manual Therapy   Manual Therapy Soft tissue mobilization;Passive ROM    Soft tissue mobilization to the pectoralis and lateral expander border    Passive ROM to the right shoulder limited by spasm in the pectoralis: movements into flexion, abduction, and ER  PT Long Term Goals - 06/08/20 1409      PT LONG TERM GOAL #1   Title Pt will be independent with HEP to increase right shoulder ROM and strength    Time 6    Period Weeks    Status New    Target Date 07/20/20      PT LONG TERM GOAL #2   Title Pt will have right shoulder flexion and abduction within 10 degrees of baseline    Baseline abd 175, flex 165    Time 6    Period Weeks    Status New    Target Date 07/20/20      PT LONG TERM GOAL #3   Title Pts quick dash will be no greater than 10%    Baseline 36%    Time 6    Period Weeks    Status New    Target Date 07/20/20      PT LONG TERM GOAL #4   Title Pt will be fit for compression garments prn (sleeve,bra)    Time 6    Period Weeks    Status New    Target Date 07/20/20      PT LONG TERM GOAL #5   Title Pt will have decreased pain/tightness by greater than 50%    Time 6    Period Weeks     Status New    Target Date 07/20/20                 Plan - 06/15/20 1652    Clinical Impression Statement Pt reports not as much stiffness and last week and less fear of using the arm but overall still apprehensive.  Started AAROM/PROM with limitations from spasm in the pectoralis towards the end of treatment.  Pt tolerated overall well but ended session when chest started to tolerate less.  overall expander is very tight.  pt was able to go back to work this week and feels more mentally tired than physically.    PT Frequency 2x / week    PT Duration 6 weeks    PT Treatment/Interventions ADLs/Self Care Home Management;Therapeutic exercise;Manual techniques;Patient/family education;Manual lymph drainage;Scar mobilization;Passive range of motion    PT Next Visit Plan AAROM, STW pecs/lats, MFR prn, PROM, pulleys, check swelling    Consulted and Agree with Plan of Care Patient           Patient will benefit from skilled therapeutic intervention in order to improve the following deficits and impairments:     Visit Diagnosis: Malignant neoplasm of upper-outer quadrant of right breast in female, estrogen receptor positive ([place])  Acute pain of right shoulder  Aftercare following surgery for neoplasm  Abnormal posture     Problem List Patient Active Problem List   Diagnosis Date Noted  . Genetic testing 03/08/2020  . Malignant neoplasm of overlapping sites of right breast ([place]) 02/25/2020  . Family history of breast cancer     Stark Bray 06/15/2020, 4:55 PM  [place], [place], [address] Phone: [phone]   Fax:  [phone]  Name: Gail Erickson MRN: [id number] Date of Birth: May 07, 1975

## 2020-06-16 ENCOUNTER — Other Ambulatory Visit: Payer: Self-pay

## 2020-06-16 DIAGNOSIS — C50811 Malignant neoplasm of overlapping sites of right female breast: Secondary | ICD-10-CM

## 2020-06-16 DIAGNOSIS — F4320 Adjustment disorder, unspecified: Secondary | ICD-10-CM | POA: Diagnosis not present

## 2020-06-16 MED ORDER — TAMOXIFEN CITRATE 20 MG PO TABS: 20.0000 mg | ORAL_TABLET | Freq: Every day | ORAL | 3 refills | Status: DC

## 2020-06-17 ENCOUNTER — Encounter: Payer: Self-pay | Admitting: Rehabilitation

## 2020-06-17 ENCOUNTER — Ambulatory Visit: Payer: BC Managed Care – PPO | Admitting: Rehabilitation

## 2020-06-17 ENCOUNTER — Other Ambulatory Visit: Payer: Self-pay

## 2020-06-17 DIAGNOSIS — Z170 Estrogen receptor positive status [ER+]: Secondary | ICD-10-CM | POA: Diagnosis not present

## 2020-06-17 DIAGNOSIS — R293 Abnormal posture: Secondary | ICD-10-CM

## 2020-06-17 DIAGNOSIS — M25511 Pain in right shoulder: Secondary | ICD-10-CM

## 2020-06-17 DIAGNOSIS — Z483 Aftercare following surgery for neoplasm: Secondary | ICD-10-CM

## 2020-06-17 DIAGNOSIS — C50411 Malignant neoplasm of upper-outer quadrant of right female breast: Secondary | ICD-10-CM

## 2020-06-17 NOTE — Therapy (Signed)
[place], [place], [address] Phone: [phone]   Fax:  [phone]  Physical Therapy Treatment  Patient Details  Name: Gail Erickson MRN: [id number] Date of Birth: 03-May-1975 Referring Provider (PT): Dr. Marlou Starks   Encounter Date: 06/17/2020   PT End of Session - 06/17/20 1654    Visit Number 4    Number of Visits 14    Date for PT Re-Evaluation 07/20/20    PT Start Time 1602    PT Stop Time 1649    PT Time Calculation (min) 47 min    Activity Tolerance Patient tolerated treatment well    Behavior During Therapy [facility] for tasks assessed/performed           Past Medical History:  Diagnosis Date  . Abnormal Pap smear of cervix 06-02-14   LGSIL:neg HR HPV (1st abn. pap)  . Cyst of right breast    --stable in 2015 U/S done in [place], [place]  . Elevated DHEA ([place]) 05/21/15   level = 343  . Family history of breast cancer   . 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 Migraine without aura   . Pre-diabetes   . Prediabetes 2021   sees Dr.Balan  . Vitamin D deficiency 05/21/15   level = 10    Past Surgical History:  Procedure Laterality Date  . BREAST RECONSTRUCTION WITH PLACEMENT OF TISSUE EXPANDER AND FLEX HD (ACELLULAR HYDRATED DERMIS) Right 04/28/2020   Procedure: IMMEDIATE RIGHT BREAST RECONSTRUCTION WITH PLACEMENT OF TISSUE EXPANDER AND FLEX HD (ACELLULAR HYDRATED DERMIS);  Surgeon: Wallace Going, DO;  Location: [place];  Service: Plastics;  Laterality: Right;  . MASTECTOMY W/ SENTINEL NODE BIOPSY Right 04/28/2020   Procedure: RIGHT MASTECTOMY WITH SENTINEL LYMPH NODE BIOPSY;  Surgeon: [name], MD;  Location: [place];  Service: General;  Laterality: Right;  PECTORAL BLOCK  . UMBILICAL HERNIA REPAIR     --as a child    There were no vitals filed for this visit.   Subjective Assessment - 06/17/20 1603    Subjective nothing new     Pertinent History December 6th learned of Cancer from a biopsy done on Dec 3.  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 Did gene testing today, and having a biopsy on left breast tomorrow. Presently planning for right breast mastectomy but awaiting gene testing results. Pt had right mastectomy with SLNB and tissue expanders on 04/28/2020    Patient Stated Goals Reassess right UE post surgery.    Currently in Pain? No/denies                             [facility] Adult PT Treatment/Exercise - 06/17/20 0001      Shoulder Exercises: Pulleys   Flexion 2 minutes    ABduction 2 minutes      Shoulder Exercises: Therapy Ball   Flexion Both;5 reps    ABduction 5 reps;Right      Shoulder Exercises: Stretch   Corner Stretch 3 reps;20 seconds    Corner Stretch Limitations doorway stretch      Manual Therapy   Manual Therapy Myofascial release    Soft tissue mobilization to the pectoralis and lateral expander border    Myofascial Release Rt axilla    Passive ROM to the Rt shoulder into flexion, abduction, Er and horizontal abduction not limited by spasm today.  vcs for relaxation  PT Long Term Goals - 06/08/20 1409      PT LONG TERM GOAL #1   Title Pt will be independent with HEP to increase right shoulder ROM and strength    Time 6    Period Weeks    Status New    Target Date 07/20/20      PT LONG TERM GOAL #2   Title Pt will have right shoulder flexion and abduction within 10 degrees of baseline    Baseline abd 175, flex 165    Time 6    Period Weeks    Status New    Target Date 07/20/20      PT LONG TERM GOAL #3   Title Pts quick dash will be no greater than 10%    Baseline 36%    Time 6    Period Weeks    Status New    Target Date 07/20/20      PT LONG TERM GOAL #4   Title Pt will be fit for compression garments prn (sleeve,bra)    Time 6    Period Weeks    Status New    Target Date 07/20/20      PT LONG TERM GOAL #5   Title Pt will have  decreased pain/tightness by greater than 50%    Time 6    Period Weeks    Status New    Target Date 07/20/20                 Plan - 06/17/20 1654    Clinical Impression Statement Pt improved today compared to last visit with no spasm and improved PROM.  Still feeling the pull in the Rt axilla and pectoralis with end range flexion and abduction due to expander tightness.  Lateral trunk seems not edema related but skin fold.    PT Frequency 2x / week    PT Duration 6 weeks    PT Treatment/Interventions ADLs/Self Care Home Management;Therapeutic exercise;Manual techniques;Patient/family education;Manual lymph drainage;Scar mobilization;Passive range of motion    PT Next Visit Plan advance TE to include some stretches like doorway LTR, etc., , AAROM, STW pecs/lats, MFR prn, PROM, pulleys,    PT Home Exercise Plan 4 post op exercises, supine wand flex/scaption    Consulted and Agree with Plan of Care Patient           Patient will benefit from skilled therapeutic intervention in order to improve the following deficits and impairments:     Visit Diagnosis: Malignant neoplasm of upper-outer quadrant of right breast in female, estrogen receptor positive ([place])  Acute pain of right shoulder  Aftercare following surgery for neoplasm  Estrogen receptor positive  Abnormal posture     Problem List Patient Active Problem List   Diagnosis Date Noted  . Genetic testing 03/08/2020  . Malignant neoplasm of overlapping sites of right breast ([place]) 02/25/2020  . Family history of breast cancer     Stark Bray 06/17/2020, 4:58 PM  [place], [place], [address] Phone: [phone]   Fax:  [phone]  Name: Belissa Kooy MRN: [id number] Date of Birth: Dec 22, 1975

## 2020-06-22 ENCOUNTER — Other Ambulatory Visit: Payer: Self-pay

## 2020-06-22 ENCOUNTER — Ambulatory Visit: Payer: BC Managed Care – PPO

## 2020-06-22 DIAGNOSIS — C50411 Malignant neoplasm of upper-outer quadrant of right female breast: Secondary | ICD-10-CM | POA: Diagnosis not present

## 2020-06-22 DIAGNOSIS — M25511 Pain in right shoulder: Secondary | ICD-10-CM | POA: Diagnosis not present

## 2020-06-22 DIAGNOSIS — Z483 Aftercare following surgery for neoplasm: Secondary | ICD-10-CM

## 2020-06-22 DIAGNOSIS — Z170 Estrogen receptor positive status [ER+]: Secondary | ICD-10-CM | POA: Diagnosis not present

## 2020-06-22 DIAGNOSIS — R293 Abnormal posture: Secondary | ICD-10-CM | POA: Diagnosis not present

## 2020-06-22 NOTE — Patient Instructions (Signed)
Access Code: [email or web address] URL: [email or web address] Date: 06/22/2020 Prepared by: Cheral Almas  Exercises Scapular Retraction with Resistance - 1 x daily - 1 sets - 10 reps Scapular Retraction with Resistance Advanced - 1 x daily - 3 x weekly - 1 sets - 10 reps Shoulder External Rotation and Scapular Retraction with Resistance - 1 x daily - 3 x weekly - 1 sets - 10 reps Corner [place] Stretch - 1-2 x daily - 7 x weekly - 1 sets - 3 reps - 15 hold

## 2020-06-22 NOTE — Therapy (Signed)
[place], [place], [address] Phone: [phone]   Fax:  [phone]  Physical Therapy Treatment  Patient Details  Name: Gail Erickson MRN: [id number] Date of Birth: [birth year] Referring Provider (PT): Dr. Marlou Starks   Encounter Date: 06/22/2020   PT End of Session - 06/22/20 1704    Visit Number 5    Number of Visits 14    Date for PT Re-Evaluation 07/20/20    PT Start Time 3254    PT Stop Time 1700    PT Time Calculation (min) 55 min    Activity Tolerance Patient tolerated treatment well    Behavior During Therapy [facility] for tasks assessed/performed           Past Medical History:  Diagnosis Date  . Abnormal Pap smear of cervix 06-02-14   LGSIL:neg HR HPV (1st abn. pap)  . Cyst of right breast    --stable in 2015 U/S done in [place], [place]  . Elevated DHEA ([place]) 05/21/15   level = 343  . Family history of breast cancer   . 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 Migraine without aura   . Pre-diabetes   . Prediabetes 2021   sees Dr.Balan  . Vitamin D deficiency 05/21/15   level = 10    Past Surgical History:  Procedure Laterality Date  . BREAST RECONSTRUCTION WITH PLACEMENT OF TISSUE EXPANDER AND FLEX HD (ACELLULAR HYDRATED DERMIS) Right 04/28/2020   Procedure: IMMEDIATE RIGHT BREAST RECONSTRUCTION WITH PLACEMENT OF TISSUE EXPANDER AND FLEX HD (ACELLULAR HYDRATED DERMIS);  Surgeon: Wallace Going, DO;  Location: [place];  Service: Plastics;  Laterality: Right;  . MASTECTOMY W/ SENTINEL NODE BIOPSY Right 04/28/2020   Procedure: RIGHT MASTECTOMY WITH SENTINEL LYMPH NODE BIOPSY;  Surgeon: [name], MD;  Location: [place];  Service: General;  Laterality: Right;  PECTORAL BLOCK  . UMBILICAL HERNIA REPAIR     --as a child    There were no vitals filed for this visit.   Subjective Assessment - 06/22/20 1606    Subjective I had a little spasm  in my right chest yesterday but not painful.  Went pack to work last week.  My driving has gotten better, Still notice some tightness when I reach to cabinets.  Have my next fill on April 22nd.    Pertinent History December 6th learned of Cancer from a biopsy done on Dec 3.  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 Did gene testing today, and having a biopsy on left breast tomorrow. Presently planning for right breast mastectomy but awaiting gene testing results. Pt had right mastectomy with SLNB and tissue expanders on 04/28/2020.    Patient Stated Goals Reassess right UE post surgery.                             [facility] Adult PT Treatment/Exercise - 06/22/20 0001      Shoulder Exercises: Supine   Other Supine Exercises AROM supine flexion, scaption, horizontal abd x 5      Shoulder Exercises: Standing   External Rotation Strengthening;Both;10 reps    Theraband Level (Shoulder External Rotation) Level 1 (Yellow)    Extension Strengthening;Both;10 reps    Theraband Level (Shoulder Extension) Level 1 (Yellow)    Retraction Both;10 reps;Strengthening    Theraband Level (Shoulder Retraction) Level 1 (Yellow)      Shoulder Exercises: Pulleys  Flexion 2 minutes    ABduction 2 minutes      Shoulder Exercises: Stretch   Corner Stretch 3 reps   15 sec     Manual Therapy   Soft tissue mobilization to the pectoralis and lateral expander border    Myofascial Release Rt axilla    Passive ROM to the Rt shoulder into flexion, abduction, ER   vcs for relaxation                  PT Education - 06/22/20 1703    Education Details Pt was educated in standing scap. retraction, shoulder extension and bilateral ER with yellow band    Person(s) Educated Patient    Methods Demonstration;Handout    Comprehension Returned demonstration;Verbalized understanding               PT Long Term Goals - 06/08/20 1409      PT LONG TERM GOAL #1   Title Pt will be independent with HEP to increase  right shoulder ROM and strength    Time 6    Period Weeks    Status New    Target Date 07/20/20      PT LONG TERM GOAL #2   Title Pt will have right shoulder flexion and abduction within 10 degrees of baseline    Baseline abd 175, flex 165    Time 6    Period Weeks    Status New    Target Date 07/20/20      PT LONG TERM GOAL #3   Title Pts quick dash will be no greater than 10%    Baseline 36%    Time 6    Period Weeks    Status New    Target Date 07/20/20      PT LONG TERM GOAL #4   Title Pt will be fit for compression garments prn (sleeve,bra)    Time 6    Period Weeks    Status New    Target Date 07/20/20      PT LONG TERM GOAL #5   Title Pt will have decreased pain/tightness by greater than 50%    Time 6    Period Weeks    Status New    Target Date 07/20/20                 Plan - 06/22/20 1704    Clinical Impression Statement Pt with continued tenderness at pectoralis and lats but loosened up nicely especially  pecs with hand behind head.  Continues with end range tightness and has greatest difficulty with abduction.  She did well with standing postural band exercises and pt was able to do with good form and without pain.  She was given illustrated instructions.  Expanders continue to be uncomfortable especially laterally    Personal Factors and Comorbidities Comorbidity 1    Comorbidities Right breast CA with expanders    Examination-Activity Limitations Reach Overhead;Sleep;Dressing    Examination-Participation Restrictions Driving    Stability/Clinical Decision Making Stable/Uncomplicated    Rehab Potential Excellent    PT Frequency 2x / week    PT Duration 6 weeks    PT Treatment/Interventions ADLs/Self Care Home Management;Therapeutic exercise;Manual techniques;Patient/family education;Manual lymph drainage;Scar mobilization;Passive range of motion    PT Next Visit Plan Cont STM pecs, lats, PROM, pulleys, review standing postural TB exs    PT Home  Exercise Plan 4 post op exercises, supine wand flex/scaption, corner stretch, standing band(SR, shoulder extension, bilateral ER)  Consulted and Agree with Plan of Care Patient           Patient will benefit from skilled therapeutic intervention in order to improve the following deficits and impairments:  Decreased activity tolerance,Decreased knowledge of precautions,Decreased range of motion,Decreased strength,Decreased scar mobility,Increased edema,Impaired sensation,Postural dysfunction,Impaired UE functional use,Pain  Visit Diagnosis: Acute pain of right shoulder  Aftercare following surgery for neoplasm  Abnormal posture  Malignant neoplasm of upper-outer quadrant of right breast in female, estrogen receptor positive ([place])  Estrogen receptor positive     Problem List Patient Active Problem List   Diagnosis Date Noted  . Genetic testing 03/08/2020  . Malignant neoplasm of overlapping sites of right breast ([place]) 02/25/2020  . Family history of breast cancer     Claris Pong 06/22/2020, 5:09 PM  [place], [place], [address] Phone: [phone]   Fax:  [phone]  Name: Arnola Crittendon MRN: [id number] Date of Birth: [birth year]  Cheral Almas, PT 06/22/20 5:11 PM

## 2020-06-24 ENCOUNTER — Ambulatory Visit: Payer: BC Managed Care – PPO

## 2020-06-24 ENCOUNTER — Other Ambulatory Visit: Payer: Self-pay

## 2020-06-24 DIAGNOSIS — Z483 Aftercare following surgery for neoplasm: Secondary | ICD-10-CM

## 2020-06-24 DIAGNOSIS — M25511 Pain in right shoulder: Secondary | ICD-10-CM | POA: Diagnosis not present

## 2020-06-24 DIAGNOSIS — R293 Abnormal posture: Secondary | ICD-10-CM

## 2020-06-24 DIAGNOSIS — Z170 Estrogen receptor positive status [ER+]: Secondary | ICD-10-CM | POA: Diagnosis not present

## 2020-06-24 DIAGNOSIS — C50411 Malignant neoplasm of upper-outer quadrant of right female breast: Secondary | ICD-10-CM | POA: Diagnosis not present

## 2020-06-24 NOTE — Therapy (Signed)
[place], [place], [address] Phone: [phone]   Fax:  [phone]  Physical Therapy Treatment  Patient Details  Name: Gail Erickson MRN: [id number] Date of Birth: 09-30-1975 Referring Provider (PT): Dr. Marlou Starks   Encounter Date: 06/24/2020   PT End of Session - 06/24/20 1659    Visit Number 6    Number of Visits 14    Date for PT Re-Evaluation 07/20/20    PT Start Time 2633    PT Stop Time 1655    PT Time Calculation (min) 50 min    Activity Tolerance Patient tolerated treatment well    Behavior During Therapy [facility] for tasks assessed/performed           Past Medical History:  Diagnosis Date  . Abnormal Pap smear of cervix 06-02-14   LGSIL:neg HR HPV (1st abn. pap)  . Cyst of right breast    --stable in 2015 U/S done in [place], [place]  . Elevated DHEA ([place]) 05/21/15   level = 343  . Family history of breast cancer   . 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 Migraine without aura   . Pre-diabetes   . Prediabetes 2021   sees Dr.Balan  . Vitamin D deficiency 05/21/15   level = 10    Past Surgical History:  Procedure Laterality Date  . BREAST RECONSTRUCTION WITH PLACEMENT OF TISSUE EXPANDER AND FLEX HD (ACELLULAR HYDRATED DERMIS) Right 04/28/2020   Procedure: IMMEDIATE RIGHT BREAST RECONSTRUCTION WITH PLACEMENT OF TISSUE EXPANDER AND FLEX HD (ACELLULAR HYDRATED DERMIS);  Surgeon: Wallace Going, DO;  Location: [place];  Service: Plastics;  Laterality: Right;  . MASTECTOMY W/ SENTINEL NODE BIOPSY Right 04/28/2020   Procedure: RIGHT MASTECTOMY WITH SENTINEL LYMPH NODE BIOPSY;  Surgeon: [name], MD;  Location: [place];  Service: General;  Laterality: Right;  PECTORAL BLOCK  . UMBILICAL HERNIA REPAIR     --as a child    There were no vitals filed for this visit.   Subjective Assessment - 06/24/20 1604    Subjective Pain is feeling OK  today.  I did the exercises but the band kept falling off the doorknob. Still tender at lateral side of breast.  Did fine after I left last time, but took tylenol later that night. Overall pain improved by 35%   Pertinent History December 6th learned of Cancer from a biopsy done on Dec 3.  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 Did gene testing today, and having a biopsy on left breast tomorrow. Presently planning for right breast mastectomy but awaiting gene testing results. Pt had right mastectomy with SLNB and tissue expanders on 04/28/2020.                             [place] Adult PT Treatment/Exercise - 06/24/20 0001      Shoulder Exercises: Supine   Other Supine Exercises AROM supine flexion, scaption, horizontal abd x 5 with 1#                    Manual Therapy   Manual Therapy Myofascial release;Passive ROM;Soft tissue mobilization    Soft tissue mobilization to the pectoralis and lateral expander border and in SL to lateral border and interscapular area.    Myofascial Release Rt axilla    Passive ROM to the Rt shoulder into flexion, abduction, ER , D2 flex  vcs for relaxation                       PT Long Term Goals - 06/24/20 1647      PT LONG TERM GOAL #1   Title Pt will be independent with HEP to increase right shoulder ROM and strength    Time 6    Period Weeks    Status Achieved      PT LONG TERM GOAL #2   Title Pt will have right shoulder flexion and abduction within 10 degrees of baseline    Baseline abd 175, flex 165    Time 6    Period Weeks    Status New      PT LONG TERM GOAL #3   Title Pts quick dash will be no greater than 10%    Baseline 36%    Time 6    Period Weeks    Status New      PT LONG TERM GOAL #4   Title Pt will be fit for compression garments prn (sleeve,bra)    Time 6    Period Weeks    Status New      PT LONG TERM GOAL #5   Title Pt will have decreased pain/tightness by greater than 50%    Baseline now 35%    Period  Weeks    Status New                 Plan - 06/24/20 1701    Clinical Impression Statement Pt continues with tenderness especially at lateral breast and lats.  Overall PROM has improved greatly.  She did have some lateral breast discomfort with AROM in supine that was eased with a pillow under her right arm.  She is very motivated and compliant.    Personal Factors and Comorbidities Comorbidity 1    Comorbidities Right breast CA with expanders    Examination-Activity Limitations Reach Overhead;Sleep;Dressing    Examination-Participation Restrictions Driving    Stability/Clinical Decision Making Stable/Uncomplicated    Rehab Potential Excellent    PT Frequency 2x / week    PT Duration 6 weeks    PT Treatment/Interventions ADLs/Self Care Home Management;Therapeutic exercise;Manual techniques;Patient/family education;Manual lymph drainage;Scar mobilization;Passive range of motion    PT Next Visit Plan Cont STM pecs, lats, PROM, pulleys, review standing postural TB exs,    PT Home Exercise Plan 4 post op exercises, supine wand flex/scaption, corner stretch, standing band(SR, shoulder extension, bilateral ER)    Recommended Other Services sleeve; sent request to Dr. Burr Medico for script    Consulted and Agree with Plan of Care Patient           Patient will benefit from skilled therapeutic intervention in order to improve the following deficits and impairments:  Decreased activity tolerance,Decreased knowledge of precautions,Decreased range of motion,Decreased strength,Decreased scar mobility,Increased edema,Impaired sensation,Postural dysfunction,Impaired UE functional use,Pain  Visit Diagnosis: Acute pain of right shoulder  Aftercare following surgery for neoplasm  Abnormal posture  Malignant neoplasm of upper-outer quadrant of right breast in female, estrogen receptor positive ([place])  Estrogen receptor positive     Problem List Patient Active Problem List   Diagnosis Date  Noted  . Genetic testing 03/08/2020  . Malignant neoplasm of overlapping sites of right breast ([place]) 02/25/2020  . Family history of breast cancer     Claris Pong 06/24/2020, 5:10 PM  [place], [place], [address] Phone: [phone]  Fax:  [phone]  Name: Gail Erickson MRN: [id number] Date of Birth: Aug 08, 1975 Cheral Almas, PT 06/24/20 5:13 PM

## 2020-07-02 ENCOUNTER — Ambulatory Visit (INDEPENDENT_AMBULATORY_CARE_PROVIDER_SITE_OTHER): Payer: BC Managed Care – PPO | Admitting: Plastic Surgery

## 2020-07-02 ENCOUNTER — Encounter: Payer: Self-pay | Admitting: Plastic Surgery

## 2020-07-02 ENCOUNTER — Other Ambulatory Visit: Payer: Self-pay

## 2020-07-02 VITALS — BP 113/79 | HR 96

## 2020-07-02 DIAGNOSIS — C50811 Malignant neoplasm of overlapping sites of right female breast: Secondary | ICD-10-CM

## 2020-07-02 NOTE — Progress Notes (Addendum)
   Subjective:    Patient ID: Gail Erickson, female    DOB: 03/01/1976, 45 y.o.   MRN: [id number]  The patient is a 45 year old female here for follow-up on her breast reconstruction.  She has a right breast expander in place and is doing very well with the expansion.  She thinks that she will likely need a little bit more expansion to match the other side.  There is no sign of hematoma or seroma.  Her pain is well controlled and her incision is healing nicely.     Review of Systems  Constitutional: Negative.   HENT: Negative.   Eyes: Negative.   Respiratory: Negative.   Cardiovascular: Negative.        Objective:   Physical Exam Vitals and nursing note reviewed.  Constitutional:      Appearance: Normal appearance.  Cardiovascular:     Rate and Rhythm: Normal rate.     Pulses: Normal pulses.  Pulmonary:     Effort: Pulmonary effort is normal.  Neurological:     General: No focal deficit present.     Mental Status: She is alert and oriented to person, place, and time.  Psychiatric:        Mood and Affect: Mood normal.        Behavior: Behavior normal.        Thought Content: Thought content normal.           Assessment & Plan:     ICD-10-CM   1. Malignant neoplasm of overlapping sites of right breast ([place])  C50.811     Plan on at least 1-2 more fills and exchanged in June.   Surgery will be removal of right breast expander and placement of silicone implant.  We placed injectable saline in the Expander using a sterile technique: Right: 50 cc for a total of 525 / 535 cc

## 2020-07-05 ENCOUNTER — Telehealth: Payer: Self-pay | Admitting: *Deleted

## 2020-07-05 ENCOUNTER — Ambulatory Visit: Payer: BC Managed Care – PPO

## 2020-07-05 DIAGNOSIS — U071 COVID-19: Secondary | ICD-10-CM

## 2020-07-05 DIAGNOSIS — Z20822 Contact with and (suspected) exposure to covid-19: Secondary | ICD-10-CM | POA: Diagnosis not present

## 2020-07-05 NOTE — Telephone Encounter (Signed)
Pt called informing she has tested positive for covid. Symptoms of lost of taste, body aches, chills, and sore throat. Abefrile. Pt would like to receive covid treatment. Msg sent to Mendel Ryder, NP to assist.  Referral placed for covid treatment.

## 2020-07-06 ENCOUNTER — Other Ambulatory Visit: Payer: Self-pay | Admitting: Adult Health

## 2020-07-06 ENCOUNTER — Encounter: Payer: Self-pay | Admitting: Adult Health

## 2020-07-06 DIAGNOSIS — U071 COVID-19: Secondary | ICD-10-CM

## 2020-07-06 MED ORDER — NIRMATRELVIR/RITONAVIR (PAXLOVID)TABLET: 3.0000 | ORAL_TABLET | Freq: Two times a day (BID) | ORAL | 0 refills | Status: AC

## 2020-07-06 MED ORDER — NIRMATRELVIR/RITONAVIR (PAXLOVID)TABLET: 3.0000 | ORAL_TABLET | Freq: Two times a day (BID) | ORAL | 0 refills | Status: DC

## 2020-07-06 NOTE — Progress Notes (Signed)
Outpatient Oral COVID Treatment Note  I connected with Gail Erickson on 07/06/2020/10:09 AM by telephone and verified that I am speaking with the correct person using two identifiers.  I discussed the limitations, risks, security, and privacy concerns of performing an evaluation and management service by telephone and the availability of in person appointments. I also discussed with the patient that there may be a patient responsible charge related to this service. The patient expressed understanding and agreed to proceed.  Patient location: home Provider location: home  Diagnosis: COVID-19 infection  Purpose of visit: Discussion of potential use of Molnupiravir or Paxlovid, a new treatment for mild to moderate COVID-19 viral infection in non-hospitalized patients.   Subjective: Patient is a 45 y.o. female who has been diagnosed with COVID 19 viral infection.  Their symptoms began on 07/04/2020 with fatige, and sore throat.    Past Medical History:  Diagnosis Date  . Abnormal Pap smear of cervix 06-02-14   LGSIL:neg HR HPV (1st abn. pap)  . Cyst of right breast    --stable in 2015 U/S done in [place], [place]  . Elevated DHEA ([place]) 05/21/15   level = 343  . Family history of breast cancer   . 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 Migraine without aura   . Pre-diabetes   . Prediabetes 2021   sees Dr.Balan  . Vitamin D deficiency 05/21/15   level = 10    No Known Allergies   Current Outpatient Medications:  .  Ascorbic Acid (VITAMIN C) 1000 MG tablet, Take 1,000 mg by mouth daily., Disp: , Rfl:  .  Aspirin-Acetaminophen-Caffeine (EXCEDRIN MIGRAINE PO), Take 1 tablet by mouth as needed., Disp: , Rfl:  .  Cholecalciferol (VITAMIN D3) 125 MCG (5000 UT) CAPS, Take 1 capsule by mouth daily., Disp: , Rfl:  .  diazepam (VALIUM) 2 MG tablet, Take 1 tablet (2 mg total) by mouth every 12 (twelve) hours as needed for muscle spasms., Disp: 20 tablet, Rfl: 0 .  ibuprofen (ADVIL) 600 MG  tablet, Take 1 tablet (600 mg total) by mouth every 6 (six) hours as needed for mild pain or moderate pain. For use AFTER surgery, Disp: 30 tablet, Rfl: 0 .  Multiple Vitamin (MULTIVITAMIN) tablet, Take 1 tablet by mouth daily., Disp: , Rfl:  .  Omega-3 Fatty Acids (FISH OIL) 1000 MG CAPS, Take by mouth., Disp: , Rfl:  .  ondansetron (ZOFRAN-ODT) 4 MG disintegrating tablet, Take 1 tablet (4 mg total) by mouth every 8 (eight) hours as needed for nausea or vomiting., Disp: 20 tablet, Rfl: 0 .  Probiotic Product (PROBIOTIC-10 PO), Take 1 tablet by mouth., Disp: , Rfl:  .  pseudoephedrine (SUDAFED) 30 MG tablet, Take 30 mg by mouth every 4 (four) hours as needed for congestion., Disp: , Rfl:  .  tamoxifen (NOLVADEX) 20 MG tablet, Take 1 tablet (20 mg total) by mouth daily., Disp: 30 tablet, Rfl: 3  Objective: Patient sounds tired and hoarse.  They are in no apparent distress.  Breathing is non labored.  Mood and behavior are normal.  Laboratory Data:  Recent Results (from the past 2160 hour(s))  SARS CORONAVIRUS 2 (TAT 6-24 HRS) Nasopharyngeal Nasopharyngeal Swab     Status: None   Collection Time: 04/24/20  2:46 PM   Specimen: Nasopharyngeal Swab  Result Value Ref Range   SARS Coronavirus 2 NEGATIVE NEGATIVE    Comment: (NOTE) SARS-CoV-2 target nucleic acids are NOT DETECTED.  The SARS-CoV-2 RNA  is generally detectable in upper and lower respiratory specimens during the acute phase of infection. Negative results do not preclude SARS-CoV-2 infection, do not rule out co-infections with other pathogens, and should not be used as the sole basis for treatment or other patient management decisions. Negative results must be combined with clinical observations, patient history, and epidemiological information. The expected result is Negative.  Fact Sheet for Patients: SugarRoll.be  Fact Sheet for Healthcare  Providers: https://www.woods-mathews.com/  This test is not yet approved or cleared by the [place] FDA and  has been authorized for detection and/or diagnosis of SARS-CoV-2 by FDA under an Emergency Use Authorization (EUA). This EUA will remain  in effect (meaning this test can be used) for the duration of the COVID-19 declaration under Se ction 564(b)(1) of the Act, 21 U.S.C. section 360bbb-3(b)(1), unless the authorization is terminated or revoked sooner.  Performed at [facility], [place] [address] [place], [place] [address]   Pregnancy, urine POC     Status: None   Collection Time: 04/28/20  7:14 AM  Result Value Ref Range   Preg Test, Ur NEGATIVE NEGATIVE    Comment:        THE SENSITIVITY OF THIS METHODOLOGY IS >24 mIU/mL   Surgical pathology     Status: None   Collection Time: 04/28/20  9:04 AM  Result Value Ref Range   SURGICAL PATHOLOGY      SURGICAL PATHOLOGY CASE: [id number] PATIENT: Gail Erickson Surgical Pathology Report     Clinical History: right breast cancer (cm)   FINAL MICROSCOPIC DIAGNOSIS:  A. 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 LYMPH NODE, RIGHT AXILLARY, SENTINEL, EXCISION: -  No carcinoma identified in one lymph node (0/1)  ONCOLOGY TABLE:  INVASIVE CARCINOMA OF THE BREAST:  Resection  Procedure: Mastectomy and sentinel lymph node biopsy Specimen Laterality: Right Histologic Type: Invasive carcinoma of no special type (ductal, NOS) Histologic Grade:      Glandular (Acinar)/Tubular Differentiation: Score 2      Nuclear Pleomorphism: Score 3      Mitotic Rate: Score 1      Overall Grade: Nottingham grade 2 Tumor  Size: 0.5 cm Ductal Carcinoma In  Situ: Present Tumor Extent: Limited to breast parenchyma Treatment Effect in the Breast: No known presurgical therapy Margins: All margins negative for invasive carcinoma      Distance from Closest Margin (mm):> 20      Specify Closest Margin (required only if <52m): Deep DCIS Margins: Uninvolved by DCIS      Distance from Closest Margin (mm):> 20      Specify Closest Margin (required only if <12m: Deep Regional Lymph Nodes:      Number of Lymph Nodes Examined: 1      Number of Sentinel Nodes Examined: 1      Number of Lymph Nodes with Macrometastases (>2 mm): 0      Number of Lymph Nodes with Micrometastases: 0      Number of Lymph Nodes with Isolated Tumor Cells (=0.2 mm or =200 cells): 0      Size of Largest Metastatic Deposit (mm): N/A      Extranodal Extension: N/A Distant Metastasis:      Distant Site(s)  Involved: N/A Breast Biomarker Testing Performed on Previous Biopsy:      Testing Performed on Case Number: [id number]            Estrogen Receptor: POSITIVE, > 95% STRONG            Progesterone Receptor: POSITIVE, 60% STRONG            HER2: NEGATIVE (0, IHC)            Ki-67: 1% Pathologic Stage Classification (pTNM, AJCC 8th Edition): pT1a, pN0 Representative Tumor Block: A7 Comment(s): None (v4.5.0.0)  GROSS DESCRIPTION:  A: Specimen: Right simple mastectomy, received fresh.  The specimen is placed in formalin at 11:55 AM on 04/28/2020. Specimen integrity (intact/disrupted): Intact Weight: 865 g Size: 22 x 18.5 x 6 cm Skin: There is an attached 12 x 7 cm portion of skin with a central unremarkable nipple. Tumor/cavity: In the mid medial breast there are 2 biopsy clips present (coil and X shaped).  The clips are located approximately 2 cm apart.  A discrete mass is not grossly identified.  The clips are each located greater than 2 cm from the deep margin.  A specimen radiograph shows calcifications surrounding the clips extending towards the midportion  of the breast.  Ther e is palpably nodular dense fibrous tissue in the area of the clips. Uninvolved parenchyma: The breast tissue consists of soft yellow adipose tissue and dense white fibrous tissue. Prognostic indicators: Obtained from paraffin blocks if needed. Lymph nodes: There are no lymph nodes present. Block summary: 14 blocks submitted 1 = nipple 2 = deep margin at coil clip 3 = deep margin at X clip 4-10 = sections extending from coil clip (farthest medial) through X clip to mid breast in area of calcifications 11 = upper lateral 12 = lower lateral 13 = upper medial 14 = lower medial  B: Received fresh is a portion of fibroadipose tissue.  There is a 1.0 x 0.9 x 0.6 cm rubbery tan-pink nodule grossly consistent with a lymph node.  The nodule is bisected and entirely submitted in 1 cassette. [facility] 04/29/2020)    Final Diagnosis performed by Thressa Sheller, MD.   Electronically signed 04/30/2020 Technical and / or Professional components performed at [facility]. [facility], [address], [place], [place] [address].  Immunohistochemistry Technical component (if applicable) was performed at [facility]. [address] [place], [place], [place] [address].   IMMUNOHISTOCHEMISTRY DISCLAIMER (if applicable): Some of these immunohistochemical stains may have been developed and the performance characteristics determine by [facility]. Some may not have been cleared or approved by the U.S. Food and Drug Administration. The FDA has determined that such clearance or approval is not necessary. This test is used for clinical purposes. It should not be regarded as investigational or for research. This laboratory is certified under the [place] (CLIA-88) as qualified to perform high complexity clinical laboratory testing.  The controls stained appropriately.      Assessment: 45 y.o. female with  mild/moderate COVID 19 viral infection diagnosed on 07/05/2020 at high risk for progression to severe COVID 19.  Plan:  This patient is a 46 y.o. female that meets the following criteria for Emergency Use Authorization of: Paxlovid 1. Age >12 yr AND > 40 kg 2. SARS-COV-2 positive test 3. Symptom onset < 5 days 4. Mild-to-moderate COVID disease with high risk for severe progression to hospitalization or death  I have spoken  and communicated the following to the patient or parent/caregiver regarding: 1. Paxlovid is an unapproved drug that is authorized for use under an Emergency Use Authorization.  2. There are no adequate, approved, available products for the treatment of COVID-19 in adults who have mild-to-moderate COVID-19 and are at high risk for progressing to severe COVID-19, including hospitalization or death. 3. Other therapeutics are currently authorized. For additional information on all products authorized for treatment or prevention of COVID-19, please see TanEmporium.pl.  4. There are benefits and risks of taking this treatment as outlined in the "Fact Sheet for Patients and Caregivers."  5. "Fact Sheet for Patients and Caregivers" was reviewed with patient. A hard copy will be provided to patient from pharmacy prior to the patient receiving treatment. 6. Patients should continue to self-isolate and use infection control measures (e.g., wear mask, isolate, social distance, avoid sharing personal items, clean and disinfect "high touch" surfaces, and frequent handwashing) according to CDC guidelines.  7. The patient or parent/caregiver has the option to accept or refuse treatment. 8. Patient medication history was reviewed for potential drug interactions:Interaction with home meds: Valium, recommended to hold while taking paxlovid and can resume 24 hours later 9. Patient's GFR was  calculated to be greater than 60, and they were therefore prescribed Normal dose (GFR>60) - nirmatrelvir 176m tab (2 tablet) by mouth twice daily AND ritonavir 1059mtab (1 tablet) by mouth twice daily   After reviewing above information with the patient, the patient agrees to receive Paxlovid.  Follow up instructions:    . Take prescription BID x 5 days as directed . Reach out to pharmacist for counseling on medication if desired . For concerns regarding further COVID symptoms please follow up with your PCP or urgent care . For urgent or life-threatening issues, seek care at your local emergency department  The patient was provided an opportunity to ask questions, and all were answered. The patient agreed with the plan and demonstrated an understanding of the instructions.   [facility] and opted to pick up RX.  The patient was advised to call their PCP or seek an in-person evaluation if the symptoms worsen or if the condition fails to improve as anticipated.   I provided 15 minutes of non face-to-face telephone visit time during this encounter, and > 50% was spent counseling as documented under my assessment & plan.  LiScot DockNP 07/06/2020 /10:09 AM

## 2020-07-07 ENCOUNTER — Ambulatory Visit: Payer: BC Managed Care – PPO | Admitting: Obstetrics and Gynecology

## 2020-07-08 DIAGNOSIS — F4320 Adjustment disorder, unspecified: Secondary | ICD-10-CM | POA: Diagnosis not present

## 2020-07-12 ENCOUNTER — Encounter: Payer: Self-pay | Admitting: Obstetrics and Gynecology

## 2020-07-13 DIAGNOSIS — R7301 Impaired fasting glucose: Secondary | ICD-10-CM | POA: Diagnosis not present

## 2020-07-13 DIAGNOSIS — E8881 Metabolic syndrome: Secondary | ICD-10-CM | POA: Diagnosis not present

## 2020-07-13 DIAGNOSIS — L680 Hirsutism: Secondary | ICD-10-CM | POA: Diagnosis not present

## 2020-07-13 DIAGNOSIS — E7800 Pure hypercholesterolemia, unspecified: Secondary | ICD-10-CM | POA: Diagnosis not present

## 2020-07-13 NOTE — Progress Notes (Deleted)
45 y.o. G0P0000 Single African American female here for annual exam.    PCP:     No LMP recorded.           Sexually active: {yes no:314532}  The current method of family planning is ***condoms everytime.    Exercising: {yes no:314532}  {types:19826} Smoker:  no  Health Maintenance: Pap: 04-30-19 Neg:Neg HR HPV, 06-13-17 Neg:Neg HR HPV, 06-01-16 Neg:Neg HR HPV History of abnormal Pap:  Yes, 2016 LGSIL:Neg HR HPV;07-13-14 colpo showed LGSIL MMG: SEE Epic beginning 01/2020 Colonoscopy:  ***NEVER  BMD:   N/A  Result  N/A TDaP:  05-21-15 Gardasil:   no HIV:04-29-18 NR Hep C: 04-29-18 Neg Screening Labs:  Hb today: ***, Urine today: ***   reports that she has never smoked. She has never used smokeless tobacco. She reports current alcohol use. She reports that she does not use drugs.  Past Medical History:  Diagnosis Date  . Abnormal Pap smear of cervix 06-02-14   LGSIL:neg HR HPV (1st abn. pap)  . Cyst of right breast    --stable in 2015 U/S done in [place], [place]  . Elevated DHEA ([place]) 05/21/15   level = 343  . Family history of breast cancer   . 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 Migraine without aura   . Pre-diabetes   . Prediabetes 2021   sees Dr.Balan  . Vitamin D deficiency 05/21/15   level = 10    Past Surgical History:  Procedure Laterality Date  . BREAST RECONSTRUCTION WITH PLACEMENT OF TISSUE EXPANDER AND FLEX HD (ACELLULAR HYDRATED DERMIS) Right 04/28/2020   Procedure: IMMEDIATE RIGHT BREAST RECONSTRUCTION WITH PLACEMENT OF TISSUE EXPANDER AND FLEX HD (ACELLULAR HYDRATED DERMIS);  Surgeon: Wallace Going, DO;  Location: [facility];  Service: Plastics;  Laterality: Right;  . MASTECTOMY W/ SENTINEL NODE BIOPSY Right 04/28/2020   Procedure: RIGHT MASTECTOMY WITH SENTINEL LYMPH NODE BIOPSY;  Surgeon: [name], MD;  Location: [facility];  Service: General;  Laterality: Right;  PECTORAL BLOCK  . UMBILICAL HERNIA REPAIR      --as a child    Current Outpatient Medications  Medication Sig Dispense Refill  . Ascorbic Acid (VITAMIN C) 1000 MG tablet Take 1,000 mg by mouth daily.    . Aspirin-Acetaminophen-Caffeine (EXCEDRIN MIGRAINE PO) Take 1 tablet by mouth as needed.    . Cholecalciferol (VITAMIN D3) 125 MCG (5000 UT) CAPS Take 1 capsule by mouth daily.    . diazepam (VALIUM) 2 MG tablet Take 1 tablet (2 mg total) by mouth every 12 (twelve) hours as needed for muscle spasms. 20 tablet 0  . ibuprofen (ADVIL) 600 MG tablet Take 1 tablet (600 mg total) by mouth every 6 (six) hours as needed for mild pain or moderate pain. For use AFTER surgery 30 tablet 0  . Multiple Vitamin (MULTIVITAMIN) tablet Take 1 tablet by mouth daily.    . Omega-3 Fatty Acids (FISH OIL) 1000 MG CAPS Take by mouth.    . ondansetron (ZOFRAN-ODT) 4 MG disintegrating tablet Take 1 tablet (4 mg total) by mouth every 8 (eight) hours as needed for nausea or vomiting. 20 tablet 0  . Probiotic Product (PROBIOTIC-10 PO) Take 1 tablet by mouth.    . pseudoephedrine (SUDAFED) 30 MG tablet Take 30 mg by mouth every 4 (four) hours as needed for congestion.    . tamoxifen (NOLVADEX) 20 MG tablet Take 1 tablet (20 mg total) by mouth daily.  30 tablet 3   No current facility-administered medications for this visit.    Family History  Problem Relation Age of Onset  . Breast cancer Mother 7  . Diabetes Mother   . Hypertension Mother   . Hyperlipidemia Mother   . Thyroid disease Mother   . Transient ischemic attack Mother   . Breast cancer Paternal Aunt 38       bil. Breast ca--deceased CHF  . Breast cancer Cousin 34       maternal first cousin died Breast ca age 83  . Hyperlipidemia Father   . HIV/AIDS Maternal Uncle   . Cancer Paternal Uncle        NOS - needed BMT  . Cancer Maternal Grandmother 64       adrenal gland cancer  . Breast cancer Paternal Grandmother        dx in her mid to late 86s  . Aneurysm Maternal Aunt 26       Brain  .  Kidney disease Maternal Uncle   . Cancer Other        2 maternal great uncles with cancer NOS  . Breast cancer Maternal Aunt 72       Breast cancer  . Breast cancer Maternal Aunt 72    Review of Systems  Exam:   There were no vitals taken for this visit.    General appearance: alert, cooperative and appears stated age Head: normocephalic, without obvious abnormality, atraumatic Neck: no adenopathy, supple, symmetrical, trachea midline and thyroid normal to inspection and palpation Lungs: clear to auscultation bilaterally Breasts: normal appearance, no masses or tenderness, No nipple retraction or dimpling, No nipple discharge or bleeding, No axillary adenopathy Heart: regular rate and rhythm Abdomen: soft, non-tender; no masses, no organomegaly Extremities: extremities normal, atraumatic, no cyanosis or edema Skin: skin color, texture, turgor normal. No rashes or lesions Lymph nodes: cervical, supraclavicular, and axillary nodes normal. Neurologic: grossly normal  Pelvic: External genitalia:  no lesions              No abnormal inguinal nodes palpated.              Urethra:  normal appearing urethra with no masses, tenderness or lesions              Bartholins and Skenes: normal                 Vagina: normal appearing vagina with normal color and discharge, no lesions              Cervix: no lesions              Pap taken: {yes no:314532} Bimanual Exam:  Uterus:  normal size, contour, position, consistency, mobility, non-tender              Adnexa: no mass, fullness, tenderness              Rectal exam: {yes no:314532}.  Confirms.              Anus:  normal sphincter tone, no lesions  Chaperone was present for exam.  Assessment:   Well woman visit with normal exam.   Plan: Mammogram screening discussed. Self breast awareness reviewed. Pap and HR HPV as above. Guidelines for Calcium, Vitamin D, regular exercise program including cardiovascular and weight bearing  exercise.   Follow up annually and prn.   Additional counseling given.  {yes [id number]. _______ minutes face to face time of which over 50% was  spent in counseling.    After visit summary provided.

## 2020-07-14 ENCOUNTER — Ambulatory Visit: Payer: BC Managed Care – PPO | Admitting: Obstetrics and Gynecology

## 2020-07-16 DIAGNOSIS — L680 Hirsutism: Secondary | ICD-10-CM | POA: Diagnosis not present

## 2020-07-16 DIAGNOSIS — R7301 Impaired fasting glucose: Secondary | ICD-10-CM | POA: Diagnosis not present

## 2020-07-16 DIAGNOSIS — E8881 Metabolic syndrome: Secondary | ICD-10-CM | POA: Diagnosis not present

## 2020-07-16 DIAGNOSIS — E7800 Pure hypercholesterolemia, unspecified: Secondary | ICD-10-CM | POA: Diagnosis not present

## 2020-07-20 ENCOUNTER — Other Ambulatory Visit: Payer: Self-pay

## 2020-07-20 ENCOUNTER — Encounter: Payer: Self-pay | Admitting: Rehabilitation

## 2020-07-20 ENCOUNTER — Ambulatory Visit: Payer: BC Managed Care – PPO | Attending: General Surgery | Admitting: Rehabilitation

## 2020-07-20 DIAGNOSIS — C50811 Malignant neoplasm of overlapping sites of right female breast: Secondary | ICD-10-CM | POA: Insufficient documentation

## 2020-07-20 DIAGNOSIS — Z170 Estrogen receptor positive status [ER+]: Secondary | ICD-10-CM | POA: Insufficient documentation

## 2020-07-20 DIAGNOSIS — M25511 Pain in right shoulder: Secondary | ICD-10-CM | POA: Insufficient documentation

## 2020-07-20 DIAGNOSIS — Z483 Aftercare following surgery for neoplasm: Secondary | ICD-10-CM | POA: Insufficient documentation

## 2020-07-20 DIAGNOSIS — M25611 Stiffness of right shoulder, not elsewhere classified: Secondary | ICD-10-CM | POA: Insufficient documentation

## 2020-07-20 DIAGNOSIS — C50411 Malignant neoplasm of upper-outer quadrant of right female breast: Secondary | ICD-10-CM | POA: Insufficient documentation

## 2020-07-20 DIAGNOSIS — R293 Abnormal posture: Secondary | ICD-10-CM | POA: Insufficient documentation

## 2020-07-20 NOTE — Therapy (Signed)
[place], [place], [address] Phone: [phone]   Fax:  [phone]  Physical Therapy Treatment  Patient Details  Name: Gail Erickson MRN: [id number] Date of Birth: Jul 27, 1975 Referring Provider (PT): Dr. Marlou Starks   Encounter Date: 07/20/2020   PT End of Session - 07/20/20 1653    Visit Number 7    Number of Visits 14    Date for PT Re-Evaluation 07/20/20    PT Start Time 1600    PT Stop Time 6962    PT Time Calculation (min) 47 min    Activity Tolerance Patient tolerated treatment well    Behavior During Therapy [facility] for tasks assessed/performed           Past Medical History:  Diagnosis Date  . Abnormal Pap smear of cervix 06-02-14   LGSIL:neg HR HPV (1st abn. pap)  . Cyst of right breast    --stable in 2015 U/S done in [place], [place]  . Elevated DHEA ([place]) 05/21/15   level = 343  . Family history of breast cancer   . 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 Migraine without aura   . Pre-diabetes   . Prediabetes 2021   sees Dr.Balan  . Vitamin D deficiency 05/21/15   level = 10    Past Surgical History:  Procedure Laterality Date  . BREAST RECONSTRUCTION WITH PLACEMENT OF TISSUE EXPANDER AND FLEX HD (ACELLULAR HYDRATED DERMIS) Right 04/28/2020   Procedure: IMMEDIATE RIGHT BREAST RECONSTRUCTION WITH PLACEMENT OF TISSUE EXPANDER AND FLEX HD (ACELLULAR HYDRATED DERMIS);  Surgeon: Wallace Going, DO;  Location: [facility];  Service: Plastics;  Laterality: Right;  . MASTECTOMY W/ SENTINEL NODE BIOPSY Right 04/28/2020   Procedure: RIGHT MASTECTOMY WITH SENTINEL LYMPH NODE BIOPSY;  Surgeon: [name], MD;  Location: [facility];  Service: General;  Laterality: Right;  PECTORAL BLOCK  . UMBILICAL HERNIA REPAIR     --as a child    There were no vitals filed for this visit.   Subjective Assessment - 07/20/20 1601    Subjective I had covid and  couldn't take the muscle relaxer so I am tight    Pertinent History December 6th learned of Cancer from a biopsy done on Dec 3.  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 Did gene testing today, and having a biopsy on left breast tomorrow. Presently planning for right breast mastectomy but awaiting gene testing results. Pt had right mastectomy with SLNB and tissue expanders on 04/28/2020.    Currently in Pain? No/denies              [facility] PT Assessment - 07/20/20 0001      AROM   Right Shoulder Flexion 146 Degrees   pull in the lat   Right Shoulder ABduction 155 Degrees   in the lat pull                        [facility] Adult PT Treatment/Exercise - 07/20/20 0001      Shoulder Exercises: Pulleys   Flexion 2 minutes      Shoulder Exercises: Therapy Ball   Flexion Both;5 reps      Manual Therapy   Soft tissue mobilization to the pectoralis and lateral expander border and in SL to lateral border and interscapular area.    Myofascial Release Rt axilla    Passive ROM to the Rt shoulder into flexion, abduction, ER  vcs for relaxation                       PT Long Term Goals - 06/24/20 1647      PT LONG TERM GOAL #1   Title Pt will be independent with HEP to increase right shoulder ROM and strength    Time 6    Period Weeks    Status Achieved      PT LONG TERM GOAL #2   Title Pt will have right shoulder flexion and abduction within 10 degrees of baseline    Baseline abd 175, flex 165    Time 6    Period Weeks    Status New      PT LONG TERM GOAL #3   Title Pts quick dash will be no greater than 10%    Baseline 36%    Time 6    Period Weeks    Status New      PT LONG TERM GOAL #4   Title Pt will be fit for compression garments prn (sleeve,bra)    Time 6    Period Weeks    Status New      PT LONG TERM GOAL #5   Title Pt will have decreased pain/tightness by greater than 50%    Baseline now 35%    Period Weeks    Status New                 Plan -  07/20/20 1653    Clinical Impression Statement Pt was worried about losing progress when out with COVID but has actually pain excellent gains in her ROM.  Pt continues with decreased AROM compared to baseline, tightness in the expander, and pull in the latissimus with overhead activities.    PT Frequency 2x / week    PT Duration 6 weeks    PT Treatment/Interventions ADLs/Self Care Home Management;Therapeutic exercise;Manual techniques;Patient/family education;Manual lymph drainage;Scar mobilization;Passive range of motion    PT Next Visit Plan Cont STM pecs, lats, PROM, pulleys, review standing postural TB exs,    Consulted and Agree with Plan of Care Patient           Patient will benefit from skilled therapeutic intervention in order to improve the following deficits and impairments:     Visit Diagnosis: Aftercare following surgery for neoplasm  Abnormal posture  Malignant neoplasm of upper-outer quadrant of right breast in female, estrogen receptor positive ([place])     Problem List Patient Active Problem List   Diagnosis Date Noted  . Genetic testing 03/08/2020  . Malignant neoplasm of overlapping sites of right breast ([place]) 02/25/2020  . Family history of breast cancer     Stark Bray 07/20/2020, 4:55 PM  [place], [place], [address] Phone: [phone]   Fax:  [phone]  Name: Gail Erickson MRN: [id number] Date of Birth: 1975-04-09

## 2020-07-21 DIAGNOSIS — F4320 Adjustment disorder, unspecified: Secondary | ICD-10-CM | POA: Diagnosis not present

## 2020-07-21 NOTE — Progress Notes (Deleted)
45 y.o. G0P0000 Single African American female here for annual exam.    PCP:     No LMP recorded.           Sexually active: {yes no:314532}  The current method of family planning is ***condoms everytime.    Exercising: {yes no:314532}  {types:19826} Smoker:  no  Health Maintenance: Pap:  04-30-19 Neg:Neg HR HPV, 06-13-17 Neg:Neg HR HPV, 06-01-16 Neg:Neg HR HPV History of abnormal Pap:  Yes, 2016 LGSIL:Neg HR HPV;07-13-14 colpo showed LGSIL MMG: 01/2020 SEE EPIC Colonoscopy: ***NEVER BMD:   n/a  Result  n/a TDaP:  05-21-15 Gardasil:   no HIV: 04-29-18 NR Hep C:  04-29-18 Neg Screening Labs:  Hb today: ***, Urine today: ***   reports that she has never smoked. She has never used smokeless tobacco. She reports current alcohol use. She reports that she does not use drugs.  Past Medical History:  Diagnosis Date  . Abnormal Pap smear of cervix 06-02-14   LGSIL:neg HR HPV (1st abn. pap)  . Cyst of right breast    --stable in 2015 U/S done in [place], [place]  . Elevated DHEA ([place]) 05/21/15   level = 343  . Family history of breast cancer   . 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 Migraine without aura   . Pre-diabetes   . Prediabetes 2021   sees Dr.Balan  . Vitamin D deficiency 05/21/15   level = 10    Past Surgical History:  Procedure Laterality Date  . BREAST RECONSTRUCTION WITH PLACEMENT OF TISSUE EXPANDER AND FLEX HD (ACELLULAR HYDRATED DERMIS) Right 04/28/2020   Procedure: IMMEDIATE RIGHT BREAST RECONSTRUCTION WITH PLACEMENT OF TISSUE EXPANDER AND FLEX HD (ACELLULAR HYDRATED DERMIS);  Surgeon: Wallace Going, DO;  Location: [facility];  Service: Plastics;  Laterality: Right;  . MASTECTOMY W/ SENTINEL NODE BIOPSY Right 04/28/2020   Procedure: RIGHT MASTECTOMY WITH SENTINEL LYMPH NODE BIOPSY;  Surgeon: [name], MD;  Location: [facility];  Service: General;  Laterality: Right;  PECTORAL BLOCK  . UMBILICAL HERNIA REPAIR     --as  a child    Current Outpatient Medications  Medication Sig Dispense Refill  . Ascorbic Acid (VITAMIN C) 1000 MG tablet Take 1,000 mg by mouth daily.    . Aspirin-Acetaminophen-Caffeine (EXCEDRIN MIGRAINE PO) Take 1 tablet by mouth as needed.    . Cholecalciferol (VITAMIN D3) 125 MCG (5000 UT) CAPS Take 1 capsule by mouth daily.    . diazepam (VALIUM) 2 MG tablet Take 1 tablet (2 mg total) by mouth every 12 (twelve) hours as needed for muscle spasms. 20 tablet 0  . ibuprofen (ADVIL) 600 MG tablet Take 1 tablet (600 mg total) by mouth every 6 (six) hours as needed for mild pain or moderate pain. For use AFTER surgery 30 tablet 0  . Multiple Vitamin (MULTIVITAMIN) tablet Take 1 tablet by mouth daily.    . Omega-3 Fatty Acids (FISH OIL) 1000 MG CAPS Take by mouth.    . ondansetron (ZOFRAN-ODT) 4 MG disintegrating tablet Take 1 tablet (4 mg total) by mouth every 8 (eight) hours as needed for nausea or vomiting. 20 tablet 0  . Probiotic Product (PROBIOTIC-10 PO) Take 1 tablet by mouth.    . pseudoephedrine (SUDAFED) 30 MG tablet Take 30 mg by mouth every 4 (four) hours as needed for congestion.    . tamoxifen (NOLVADEX) 20 MG tablet Take 1 tablet (20 mg total) by mouth daily.  30 tablet 3   No current facility-administered medications for this visit.    Family History  Problem Relation Age of Onset  . Breast cancer Mother 66  . Diabetes Mother   . Hypertension Mother   . Hyperlipidemia Mother   . Thyroid disease Mother   . Transient ischemic attack Mother   . Breast cancer Paternal Aunt 39       bil. Breast ca--deceased CHF  . Breast cancer Cousin 50       maternal first cousin died Breast ca age 5  . Hyperlipidemia Father   . HIV/AIDS Maternal Uncle   . Cancer Paternal Uncle        NOS - needed BMT  . Cancer Maternal Grandmother 71       adrenal gland cancer  . Breast cancer Paternal Grandmother        dx in her mid to late 96s  . Aneurysm Maternal Aunt 26       Brain  . Kidney  disease Maternal Uncle   . Cancer Other        2 maternal great uncles with cancer NOS  . Breast cancer Maternal Aunt 72       Breast cancer  . Breast cancer Maternal Aunt 72    Review of Systems  Exam:   There were no vitals taken for this visit.    General appearance: alert, cooperative and appears stated age Head: normocephalic, without obvious abnormality, atraumatic Neck: no adenopathy, supple, symmetrical, trachea midline and thyroid normal to inspection and palpation Lungs: clear to auscultation bilaterally Breasts: normal appearance, no masses or tenderness, No nipple retraction or dimpling, No nipple discharge or bleeding, No axillary adenopathy Heart: regular rate and rhythm Abdomen: soft, non-tender; no masses, no organomegaly Extremities: extremities normal, atraumatic, no cyanosis or edema Skin: skin color, texture, turgor normal. No rashes or lesions Lymph nodes: cervical, supraclavicular, and axillary nodes normal. Neurologic: grossly normal  Pelvic: External genitalia:  no lesions              No abnormal inguinal nodes palpated.              Urethra:  normal appearing urethra with no masses, tenderness or lesions              Bartholins and Skenes: normal                 Vagina: normal appearing vagina with normal color and discharge, no lesions              Cervix: no lesions              Pap taken: {yes no:314532} Bimanual Exam:  Uterus:  normal size, contour, position, consistency, mobility, non-tender              Adnexa: no mass, fullness, tenderness              Rectal exam: {yes no:314532}.  Confirms.              Anus:  normal sphincter tone, no lesions  Chaperone was present for exam.  Assessment:   Well woman visit with normal exam.   Plan: Mammogram screening discussed. Self breast awareness reviewed. Pap and HR HPV as above. Guidelines for Calcium, Vitamin D, regular exercise program including cardiovascular and weight bearing exercise.    Follow up annually and prn.   Additional counseling given.  {yes [id number]. _______ minutes face to face time of which over 50% was  spent in counseling.    After visit summary provided.

## 2020-07-22 ENCOUNTER — Ambulatory Visit: Payer: BC Managed Care – PPO | Admitting: Rehabilitation

## 2020-07-22 ENCOUNTER — Encounter: Payer: Self-pay | Admitting: Rehabilitation

## 2020-07-22 ENCOUNTER — Other Ambulatory Visit: Payer: Self-pay

## 2020-07-22 DIAGNOSIS — R293 Abnormal posture: Secondary | ICD-10-CM

## 2020-07-22 DIAGNOSIS — M25511 Pain in right shoulder: Secondary | ICD-10-CM | POA: Diagnosis not present

## 2020-07-22 DIAGNOSIS — C50411 Malignant neoplasm of upper-outer quadrant of right female breast: Secondary | ICD-10-CM | POA: Diagnosis not present

## 2020-07-22 DIAGNOSIS — M25611 Stiffness of right shoulder, not elsewhere classified: Secondary | ICD-10-CM | POA: Diagnosis not present

## 2020-07-22 DIAGNOSIS — Z170 Estrogen receptor positive status [ER+]: Secondary | ICD-10-CM | POA: Diagnosis not present

## 2020-07-22 DIAGNOSIS — Z483 Aftercare following surgery for neoplasm: Secondary | ICD-10-CM | POA: Diagnosis not present

## 2020-07-22 DIAGNOSIS — C50811 Malignant neoplasm of overlapping sites of right female breast: Secondary | ICD-10-CM

## 2020-07-22 NOTE — Therapy (Signed)
[place], [place], [address] Phone: [phone]   Fax:  [phone]  Physical Therapy Treatment  Patient Details  Name: Gail Erickson MRN: [id number] Date of Birth: 02/09/1976 Referring Provider (PT): Dr. Marlou Starks   Encounter Date: 07/22/2020   PT End of Session - 07/22/20 1705    Visit Number 8    Number of Visits 16    Date for PT Re-Evaluation 09/16/20    PT Start Time 1504    PT Stop Time 1600    PT Time Calculation (min) 56 min    Activity Tolerance Patient tolerated treatment well    Behavior During Therapy [facility] for tasks assessed/performed           Past Medical History:  Diagnosis Date  . Abnormal Pap smear of cervix 06-02-14   LGSIL:neg HR HPV (1st abn. pap)  . Cyst of right breast    --stable in 2015 U/S done in [place], [place]  . Elevated DHEA ([place]) 05/21/15   level = 343  . Family history of breast cancer   . 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 Migraine without aura   . Pre-diabetes   . Prediabetes 2021   sees Dr.Balan  . Vitamin D deficiency 05/21/15   level = 10    Past Surgical History:  Procedure Laterality Date  . BREAST RECONSTRUCTION WITH PLACEMENT OF TISSUE EXPANDER AND FLEX HD (ACELLULAR HYDRATED DERMIS) Right 04/28/2020   Procedure: IMMEDIATE RIGHT BREAST RECONSTRUCTION WITH PLACEMENT OF TISSUE EXPANDER AND FLEX HD (ACELLULAR HYDRATED DERMIS);  Surgeon: Wallace Going, DO;  Location: [facility];  Service: Plastics;  Laterality: Right;  . MASTECTOMY W/ SENTINEL NODE BIOPSY Right 04/28/2020   Procedure: RIGHT MASTECTOMY WITH SENTINEL LYMPH NODE BIOPSY;  Surgeon: [name], MD;  Location: [facility];  Service: General;  Laterality: Right;  PECTORAL BLOCK  . UMBILICAL HERNIA REPAIR     --as a child    There were no vitals filed for this visit.   Subjective Assessment - 07/22/20 1605    Subjective I felt like taking a  muscle relaxer after last time but I didn't.  The right outside breast was sore    Pertinent History December 6th learned of Cancer from a biopsy done on Dec 3.  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 Did gene testing today, and having a biopsy on left breast tomorrow. Presently planning for right breast mastectomy but awaiting gene testing results. Pt had right mastectomy with SLNB and tissue expanders on 04/28/2020.    Currently in Pain? No/denies                             [facility] Adult PT Treatment/Exercise - 07/22/20 0001      Shoulder Exercises: Standing   Flexion 10 reps;Both    Shoulder Flexion Weight (lbs) 2    Extension Strengthening;Both;10 reps    Theraband Level (Shoulder Extension) Level 2 (Red)    Row Both;10 reps    Theraband Level (Shoulder Row) Level 2 (Red)    Other Standing Exercises scaption 2# x 10      Shoulder Exercises: Pulleys   Flexion 2 minutes    ABduction 2 minutes      Manual Therapy   Soft tissue mobilization to the pectoralis and lateral expander border and in SL to lateral border and interscapular area.    Myofascial Release  Rt axilla    Passive ROM to the Rt shoulder into flexion, abduction, ER   vcs for relaxation                       PT Long Term Goals - 06/24/20 1647      PT LONG TERM GOAL #1   Title Pt will be independent with HEP to increase right shoulder ROM and strength    Time 6    Period Weeks    Status Achieved      PT LONG TERM GOAL #2   Title Pt will have right shoulder flexion and abduction within 10 degrees of baseline    Baseline abd 175, flex 165    Time 6    Period Weeks    Status New      PT LONG TERM GOAL #3   Title Pts quick dash will be no greater than 10%    Baseline 36%    Time 6    Period Weeks    Status New      PT LONG TERM GOAL #4   Title Pt will be fit for compression garments prn (sleeve,bra)    Time 6    Period Weeks    Status New      PT LONG TERM GOAL #5   Title Pt will have  decreased pain/tightness by greater than 50%    Baseline now 35%    Period Weeks    Status New                 Plan - 07/22/20 1706    Clinical Impression Statement Pt continues with Rt expander and upper quadrant tightness due to very lateral sitting expander.  Pt continues to benefit from MT and TE to relieve pain and tightness from work and to maximize ROM until implant exchange in early July.    PT Frequency 2x / week    PT Duration 6 weeks    PT Treatment/Interventions ADLs/Self Care Home Management;Therapeutic exercise;Manual techniques;Patient/family education;Manual lymph drainage;Scar mobilization;Passive range of motion    PT Next Visit Plan Cont STM pecs, lats, PROM, pulleys, postural exs    Consulted and Agree with Plan of Care Patient           Patient will benefit from skilled therapeutic intervention in order to improve the following deficits and impairments:     Visit Diagnosis: Abnormal posture  Malignant neoplasm of overlapping sites of right breast (HCC)  Stiffness of right shoulder, not elsewhere classified  Aftercare following surgery for neoplasm     Problem List Patient Active Problem List   Diagnosis Date Noted  . Genetic testing 03/08/2020  . Malignant neoplasm of overlapping sites of right breast ([facility]) 02/25/2020  . Family history of breast cancer     Stark Bray 07/22/2020, 5:11 PM  [place], [place], [address] Phone: [phone]   Fax:  [phone]  Name: Malee Grays MRN: [id number] Date of Birth: [birth year]

## 2020-07-23 ENCOUNTER — Other Ambulatory Visit: Payer: Self-pay

## 2020-07-23 ENCOUNTER — Ambulatory Visit (INDEPENDENT_AMBULATORY_CARE_PROVIDER_SITE_OTHER): Payer: BC Managed Care – PPO | Admitting: Surgical

## 2020-07-23 DIAGNOSIS — C50811 Malignant neoplasm of overlapping sites of right female breast: Secondary | ICD-10-CM

## 2020-07-23 NOTE — Progress Notes (Signed)
Patient is a 45 year old female here for follow-up on her right breast reconstruction.  She underwent right mastectomy followed by immediate right breast reconstruction on 04/28/2020.  She is currently scheduled for removal of right breast tissue expander and placement of implant on 09/23/2020.  She is here today for reevaluation and possible additional fill.  Reports overall she is doing well.  She does report that she was diagnosed with COVID after her last fill.  She is doing well after that. She is ready for an additional fill.  Chaperone present on exam On exam right breast incision is intact.  Skin is still fairly lax and there is some room for expansion.  There is no erythema.  No fluid collection noted with palpation.  No cellulitic changes.    We placed injectable saline in the Expander using a sterile technique: Right: 50 cc for a total of 575 / 535 cc Recommend following up in a few weeks for reevaluation and additional fill.  Recommend calling with questions or concerns.  There is no sign of infection, or serom

## 2020-07-26 ENCOUNTER — Other Ambulatory Visit (HOSPITAL_COMMUNITY)
Admission: RE | Admit: 2020-07-26 | Discharge: 2020-07-26 | Disposition: A | Discharge status: Home / Self Care | Payer: BC Managed Care – PPO | Source: Ambulatory Visit | Attending: Obstetrics and Gynecology | Admitting: Obstetrics and Gynecology

## 2020-07-26 ENCOUNTER — Encounter: Payer: Self-pay | Admitting: Nurse Practitioner

## 2020-07-26 ENCOUNTER — Ambulatory Visit (INDEPENDENT_AMBULATORY_CARE_PROVIDER_SITE_OTHER): Payer: BC Managed Care – PPO | Admitting: Nurse Practitioner

## 2020-07-26 ENCOUNTER — Ambulatory Visit: Payer: BC Managed Care – PPO | Admitting: Obstetrics and Gynecology

## 2020-07-26 ENCOUNTER — Other Ambulatory Visit: Payer: Self-pay

## 2020-07-26 VITALS — BP 120/78 | HR 71 | Resp 16 | Ht 67.75 in | Wt 229.0 lb

## 2020-07-26 DIAGNOSIS — Z113 Encounter for screening for infections with a predominantly sexual mode of transmission: Secondary | ICD-10-CM | POA: Insufficient documentation

## 2020-07-26 DIAGNOSIS — Z01419 Encounter for gynecological examination (general) (routine) without abnormal findings: Secondary | ICD-10-CM

## 2020-07-26 DIAGNOSIS — Z1329 Encounter for screening for other suspected endocrine disorder: Secondary | ICD-10-CM | POA: Diagnosis not present

## 2020-07-26 DIAGNOSIS — Z130 Encounter for screening for diseases of the blood and blood-forming organs and certain disorders involving the immune mechanism: Secondary | ICD-10-CM

## 2020-07-26 DIAGNOSIS — R87610 Atypical squamous cells of undetermined significance on cytologic smear of cervix (ASC-US): Secondary | ICD-10-CM | POA: Diagnosis not present

## 2020-07-26 DIAGNOSIS — Z13220 Encounter for screening for lipoid disorders: Secondary | ICD-10-CM

## 2020-07-26 DIAGNOSIS — Z13228 Encounter for screening for other metabolic disorders: Secondary | ICD-10-CM

## 2020-07-26 LAB — WET PREP FOR TRICH, YEAST, CLUE

## 2020-07-26 NOTE — Patient Instructions (Signed)
Health Maintenance, Female Adopting a healthy lifestyle and getting preventive care are important in promoting health and wellness. Ask your health care provider about:  The right schedule for you to have regular tests and exams.  Things you can do on your own to prevent diseases and keep yourself healthy. What should I know about diet, weight, and exercise? Eat a healthy diet  Eat a diet that includes plenty of vegetables, fruits, low-fat dairy products, and lean protein.  Do not eat a lot of foods that are high in solid fats, added sugars, or sodium.   Maintain a healthy weight Body mass index (BMI) is used to identify weight problems. It estimates body fat based on height and weight. Your health care provider can help determine your BMI and help you achieve or maintain a healthy weight. Get regular exercise Get regular exercise. This is one of the most important things you can do for your health. Most adults should:  Exercise for at least 150 minutes each week. The exercise should increase your heart rate and make you sweat (moderate-intensity exercise).  Do strengthening exercises at least twice a week. This is in addition to the moderate-intensity exercise.  Spend less time sitting. Even light physical activity can be beneficial. Watch cholesterol and blood lipids Have your blood tested for lipids and cholesterol at 46 years of age, then have this test every 5 years. Have your cholesterol levels checked more often if:  Your lipid or cholesterol levels are high.  You are older than 46 years of age.  You are at high risk for heart disease. What should I know about cancer screening? Depending on your health history and family history, you may need to have cancer screening at various ages. This may include screening for:  Breast cancer.  Cervical cancer.  Colorectal cancer.  Skin cancer.  Lung cancer. What should I know about heart disease, diabetes, and high blood  pressure? Blood pressure and heart disease  High blood pressure causes heart disease and increases the risk of stroke. This is more likely to develop in people who have high blood pressure readings, are of African descent, or are overweight.  Have your blood pressure checked: ? Every 3-5 years if you are 18-39 years of age. ? Every year if you are 40 years old or older. Diabetes Have regular diabetes screenings. This checks your fasting blood sugar level. Have the screening done:  Once every three years after age 40 if you are at a normal weight and have a low risk for diabetes.  More often and at a younger age if you are overweight or have a high risk for diabetes. What should I know about preventing infection? Hepatitis B If you have a higher risk for hepatitis B, you should be screened for this virus. Talk with your health care provider to find out if you are at risk for hepatitis B infection. Hepatitis C Testing is recommended for:  Everyone born from 1945 through 1965.  Anyone with known risk factors for hepatitis C. Sexually transmitted infections (STIs)  Get screened for STIs, including gonorrhea and chlamydia, if: ? You are sexually active and are younger than 46 years of age. ? You are older than 46 years of age and your health care provider tells you that you are at risk for this type of infection. ? Your sexual activity has changed since you were last screened, and you are at increased risk for chlamydia or gonorrhea. Ask your health care provider   if you are at risk.  Ask your health care provider about whether you are at high risk for HIV. Your health care provider may recommend a prescription medicine to help prevent HIV infection. If you choose to take medicine to prevent HIV, you should first get tested for HIV. You should then be tested every 3 months for as long as you are taking the medicine. Pregnancy  If you are about to stop having your period (premenopausal) and  you may become pregnant, seek counseling before you get pregnant.  Take 400 to 800 micrograms (mcg) of folic acid every day if you become pregnant.  Ask for birth control (contraception) if you want to prevent pregnancy. Osteoporosis and menopause Osteoporosis is a disease in which the bones lose minerals and strength with aging. This can result in bone fractures. If you are 65 years old or older, or if you are at risk for osteoporosis and fractures, ask your health care provider if you should:  Be screened for bone loss.  Take a calcium or vitamin D supplement to lower your risk of fractures.  Be given hormone replacement therapy (HRT) to treat symptoms of menopause. Follow these instructions at home: Lifestyle  Do not use any products that contain nicotine or tobacco, such as cigarettes, e-cigarettes, and chewing tobacco. If you need help quitting, ask your health care provider.  Do not use street drugs.  Do not share needles.  Ask your health care provider for help if you need support or information about quitting drugs. Alcohol use  Do not drink alcohol if: ? Your health care provider tells you not to drink. ? You are pregnant, may be pregnant, or are planning to become pregnant.  If you drink alcohol: ? Limit how much you use to 0-1 drink a day. ? Limit intake if you are breastfeeding.  Be aware of how much alcohol is in your drink. In the [place] one drink equals one 12 oz bottle of beer (355 mL), one 5 oz glass of wine (148 mL), or one 1 oz glass of hard liquor (44 mL). General instructions  Schedule regular health, dental, and eye exams.  Stay current with your vaccines.  Tell your health care provider if: ? You often feel depressed. ? You have ever been abused or do not feel safe at home. Summary  Adopting a healthy lifestyle and getting preventive care are important in promoting health and wellness.  Follow your health care provider's instructions about healthy  diet, exercising, and getting tested or screened for diseases.  Follow your health care provider's instructions on monitoring your cholesterol and blood pressure. This information is not intended to replace advice given to you by your health care provider. Make sure you discuss any questions you have with your health care provider. Document Revised: 02/20/2018 Document Reviewed: 02/20/2018 Elsevier Patient Education  2021 [employer]  

## 2020-07-26 NOTE — Progress Notes (Signed)
45 y.o. [place] female here for annual exam, std testing, vaginal itching.     Patient's last menstrual period was 07/09/2020 (exact date).      Dx with breast cancer on Screening mammogram in November 2021. Was diagnosed very early, states no lymph node involvement. Had genetic testing that was negative.  Mastectomy done (no chemo, no radiation), currently has expander/reconstruction. Next surgery schedule 09/23/2020      Started on tamoxifen, having hot flashes but getting better  Still has periods regularly, lighter since on tamoxifen. Using condoms for birth control. Does not have steady partner.   Sexually active: Yes.    The current method of family planning is condoms not S/A since 4/22.    Exercising: Yes.    walking Smoker:  no  Health Maintenance: Pap:  06-02-2019 neg HPV HR neg History of abnormal Pap:  yes MMG:  Hx of breast cancer see reports, rt breast mastectomy Colonoscopy:  none BMD:   none Gardasil:   Not done Covid-19: pfizer Hep C testing: neg 2021 Screening Labs: Will get here today, needs work form signed   reports that she has never smoked. She has never used smokeless tobacco. She reports current alcohol use.   Past Medical History:  Diagnosis Date  . Abnormal Pap smear of cervix 06-02-14   LGSIL:neg HR HPV (1st abn. pap)  . Cyst of right breast    --stable in 2015 U/S done in [place], [place]  . Elevated DHEA ([place]) 05/21/15   level = 343  . Family history of breast cancer   . 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 Migraine without aura   . Pre-diabetes   . Prediabetes 2021   sees Dr.Balan  . Vitamin D deficiency 05/21/15   level = 10    Past Surgical History:  Procedure Laterality Date  . BREAST RECONSTRUCTION WITH PLACEMENT OF TISSUE EXPANDER AND FLEX HD (ACELLULAR HYDRATED DERMIS) Right 04/28/2020   Procedure: IMMEDIATE RIGHT BREAST RECONSTRUCTION WITH PLACEMENT OF TISSUE EXPANDER AND FLEX HD  (ACELLULAR HYDRATED DERMIS);  Surgeon: Wallace Going, DO;  Location: [facility];  Service: Plastics;  Laterality: Right;  . COLPOSCOPY    . MASTECTOMY W/ SENTINEL NODE BIOPSY Right 04/28/2020   Procedure: RIGHT MASTECTOMY WITH SENTINEL LYMPH NODE BIOPSY;  Surgeon: [name], MD;  Location: [facility];  Service: General;  Laterality: Right;  PECTORAL BLOCK  . UMBILICAL HERNIA REPAIR     --as a child    Current Outpatient Medications  Medication Sig Dispense Refill  . Ascorbic Acid (VITAMIN C) 1000 MG tablet Take 1,000 mg by mouth daily.    . Aspirin-Acetaminophen-Caffeine (EXCEDRIN MIGRAINE PO) Take 1 tablet by mouth as needed.    . diazepam (VALIUM) 2 MG tablet Take 1 tablet (2 mg total) by mouth every 12 (twelve) hours as needed for muscle spasms. 20 tablet 0  . ibuprofen (ADVIL) 600 MG tablet Take 1 tablet (600 mg total) by mouth every 6 (six) hours as needed for mild pain or moderate pain. For use AFTER surgery 30 tablet 0  . Multiple Vitamin (MULTIVITAMIN) tablet Take 1 tablet by mouth daily.    . Omega-3 Fatty Acids (FISH OIL) 1000 MG CAPS Take by mouth.    . Probiotic Product (PROBIOTIC-10 PO) Take 1 tablet by mouth.    . pseudoephedrine (SUDAFED) 30 MG tablet Take 30 mg by mouth every 4 (four) hours as needed  for congestion.    . tamoxifen (NOLVADEX) 20 MG tablet Take 1 tablet (20 mg total) by mouth daily. 30 tablet 3  . VITAMIN D PO Take 1,000 Int'l Units by mouth.     No current facility-administered medications for this visit.    Family History  Problem Relation Age of Onset  . Breast cancer Mother 59  . Diabetes Mother   . Hypertension Mother   . Hyperlipidemia Mother   . Thyroid disease Mother   . Transient ischemic attack Mother   . Breast cancer Paternal Aunt 67       bil. Breast ca--deceased CHF  . Breast cancer Cousin 50       maternal first cousin died Breast ca age 18  . Hyperlipidemia Father   . HIV/AIDS Maternal Uncle    . Cancer Paternal Uncle        NOS - needed BMT  . Cancer Maternal Grandmother 69       adrenal gland cancer  . Breast cancer Paternal Grandmother        dx in her mid to late 30s  . Aneurysm Maternal Aunt 26       Brain  . Kidney disease Maternal Uncle   . Cancer Other        2 maternal great uncles with cancer NOS  . Breast cancer Maternal Aunt 72       Breast cancer  . Breast cancer Maternal Aunt 72    Review of Systems  Constitutional: Negative.   HENT: Negative.   Eyes: Negative.   Respiratory: Negative.   Cardiovascular: Negative.   Gastrointestinal: Negative.   Endocrine: Negative.   Genitourinary:       Vaginal itching  Musculoskeletal: Negative.   Skin: Negative.   Allergic/Immunologic: Negative.   Neurological: Negative.   Hematological: Negative.   Psychiatric/Behavioral: Negative.     Exam:   Ht 5' 7.75" (1.721 m)   Wt 229 lb (103.9 kg)   LMP 07/09/2020 (Exact Date)   BMI 35.08 kg/m   Height: 5' 7.75" (172.1 cm)  General appearance: alert, cooperative and appears stated age, no acute distress Head: Normocephalic, without obvious abnormality Neck: no adenopathy, thyroid normal to inspection and palpation Lungs: clear to auscultation bilaterally Breasts: Expander right breast, left breast palpates normal Heart: regular rate and rhythm Abdomen: soft, non-tender; no masses,  no organomegaly Extremities: extremities normal, no edema Skin: No rashes or lesions Lymph nodes: Cervical, supraclavicular, and axillary nodes normal. No abnormal inguinal nodes palpated Neurologic: Grossly normal   Pelvic: External genitalia:  no lesions              Urethra:  normal appearing urethra with no masses, tenderness or lesions              Bartholins and Skenes: normal                 Vagina: normal appearing vagina, appropriate for age, normal appearing discharge, no lesions              Cervix: neg cervical motion tenderness, no visible lesions              Bimanual Exam:   Uterus:  normal size, contour, position, consistency, mobility, non-tender              Adnexa: no mass, fullness, tenderness                Wet Prep: neg clue, neg yeast, neg Iris Pert, CMA,  Chaperone was present for exam.  A:  Well woman exam with routine gynecological exam  Recently diagnosed with Right Breast Cancer (Genetic testing negative), taking tamoxifen  Screen for STD (sexually transmitted disease) - Plan: Cytology - PAP( [facility]), WET PREP FOR [place], YEAST, CLUE, HIV antibody (with reflex), RPR  Screening for lipid disorders - Plan: Lipid panel  Screening for deficiency anemia - Plan: CBC  Screening for endocrine, metabolic and immunity disorder - Plan: Comp Met (CMET)   P:   Pap : done today per pt request, understands may not be covered annually  Mammogram: will be meeting with cancer team soon. Screening mammogram due 01/2021  Labs: As above  Medications: no new  Advised if she became sexually active with committed partner, [facility] would be a reasonable option.  Reassured wet mount Normal- Consider Uberlube lubrication with use of Tamoxifen, vaginal dryness may be causing irritation  Physician Screening form provided by patient to complete when labs are back.

## 2020-07-27 ENCOUNTER — Encounter: Payer: Self-pay | Admitting: Nurse Practitioner

## 2020-07-27 ENCOUNTER — Ambulatory Visit: Payer: BC Managed Care – PPO

## 2020-07-27 DIAGNOSIS — M25611 Stiffness of right shoulder, not elsewhere classified: Secondary | ICD-10-CM | POA: Diagnosis not present

## 2020-07-27 DIAGNOSIS — C50811 Malignant neoplasm of overlapping sites of right female breast: Secondary | ICD-10-CM

## 2020-07-27 DIAGNOSIS — Z483 Aftercare following surgery for neoplasm: Secondary | ICD-10-CM

## 2020-07-27 DIAGNOSIS — Z170 Estrogen receptor positive status [ER+]: Secondary | ICD-10-CM

## 2020-07-27 DIAGNOSIS — R293 Abnormal posture: Secondary | ICD-10-CM | POA: Diagnosis not present

## 2020-07-27 DIAGNOSIS — C50411 Malignant neoplasm of upper-outer quadrant of right female breast: Secondary | ICD-10-CM | POA: Diagnosis not present

## 2020-07-27 DIAGNOSIS — M25511 Pain in right shoulder: Secondary | ICD-10-CM

## 2020-07-27 LAB — LIPID PANEL
Cholesterol: 193 mg/dL (ref <200)
HDL: 50 mg/dL (ref >=50)
LDL Cholesterol (Calc): 125 mg/dL (calc) — ABNORMAL HIGH
Non-HDL Cholesterol (Calc): 143 mg/dL (calc) — ABNORMAL HIGH (ref <130)
Total CHOL/HDL Ratio: 3.9 (calc) (ref <5.0)
Triglycerides: 82 mg/dL (ref <150)

## 2020-07-27 LAB — CBC
HCT: 35.1 % (ref 35.0–45.0)
Hemoglobin: 11.8 g/dL (ref 11.7–15.5)
MCH: 29.7 pg (ref 27.0–33.0)
MCHC: 33.6 g/dL (ref 32.0–36.0)
MCV: 88.4 fL (ref 80.0–100.0)
MPV: 9.4 fL (ref 7.5–12.5)
Platelets: 286 10*3/uL (ref 140–400)
RBC: 3.97 10*6/uL (ref 3.80–5.10)
RDW: 13.8 % (ref 11.0–15.0)
WBC: 4.5 10*3/uL (ref 3.8–10.8)

## 2020-07-27 LAB — COMPREHENSIVE METABOLIC PANEL
AG Ratio: 1.7 (calc) (ref 1.0–2.5)
ALT: 14 U/L (ref 6–29)
AST: 17 U/L (ref 10–35)
Albumin: 4.6 g/dL (ref 3.6–5.1)
Alkaline phosphatase (APISO): 40 U/L (ref 31–125)
BUN: 11 mg/dL (ref 7–25)
CO2: 26 mmol/L (ref 20–32)
Calcium: 9.5 mg/dL (ref 8.6–10.2)
Chloride: 106 mmol/L (ref 98–110)
Creat: 0.85 mg/dL (ref 0.50–1.10)
Globulin: 2.7 g/dL (calc) (ref 1.9–3.7)
Glucose, Bld: 86 mg/dL (ref 65–99)
Potassium: 3.9 mmol/L (ref 3.5–5.3)
Sodium: 140 mmol/L (ref 135–146)
Total Bilirubin: 0.6 mg/dL (ref 0.2–1.2)
Total Protein: 7.3 g/dL (ref 6.1–8.1)

## 2020-07-27 LAB — HIV ANTIBODY (ROUTINE TESTING W REFLEX): HIV 1&2 Ab, 4th Generation: NONREACTIVE

## 2020-07-27 LAB — RPR: RPR Ser Ql: NONREACTIVE

## 2020-07-27 NOTE — Therapy (Signed)
[place], [place], [address] Phone: [phone]   Fax:  [phone]  Physical Therapy Treatment  Patient Details  Name: Gail Erickson MRN: [id number] Date of Birth: January 25, 1976 Referring Provider (PT): Dr. Marlou Starks   Encounter Date: 07/27/2020   PT End of Session - 07/27/20 1704    Visit Number 9    Number of Visits 16    Date for PT Re-Evaluation 09/16/20    PT Start Time 1606    PT Stop Time 1700    PT Time Calculation (min) 54 min    Activity Tolerance Patient tolerated treatment well    Behavior During Therapy [facility] for tasks assessed/performed           Past Medical History:  Diagnosis Date  . Abnormal Pap smear of cervix 06-02-14   LGSIL:neg HR HPV (1st abn. pap)  . Cyst of right breast    --stable in 2015 U/S done in [place], [place]  . Elevated DHEA ([place]) 05/21/15   level = 343  . Family history of breast cancer   . 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 Migraine without aura   . Pre-diabetes   . Prediabetes 2021   sees Dr.Balan  . Vitamin D deficiency 05/21/15   level = 10    Past Surgical History:  Procedure Laterality Date  . BREAST RECONSTRUCTION WITH PLACEMENT OF TISSUE EXPANDER AND FLEX HD (ACELLULAR HYDRATED DERMIS) Right 04/28/2020   Procedure: IMMEDIATE RIGHT BREAST RECONSTRUCTION WITH PLACEMENT OF TISSUE EXPANDER AND FLEX HD (ACELLULAR HYDRATED DERMIS);  Surgeon: Wallace Going, DO;  Location: [facility];  Service: Plastics;  Laterality: Right;  . COLPOSCOPY    . MASTECTOMY W/ SENTINEL NODE BIOPSY Right 04/28/2020   Procedure: RIGHT MASTECTOMY WITH SENTINEL LYMPH NODE BIOPSY;  Surgeon: [name], MD;  Location: [facility];  Service: General;  Laterality: Right;  PECTORAL BLOCK  . UMBILICAL HERNIA REPAIR     --as a child    There were no vitals filed for this visit.   Subjective Assessment - 07/27/20 1605    Subjective I  slept alot this weekend.  I am still recovering from Covid.  I had another fill on Friday and I went to work on Friday. Having tissue expander exchange on September 23, 2020. I lifted a case of water the other day and I did it without pain. Still have the numbness but no swelling that I am aware. I am able to sleep on right arm longer now, but I wake up with my arm feeling heavy/tingling.  It resolves quickly though.  I started back to exercises last week.   Pertinent History December 6th learned of Cancer from a biopsy done on Dec 3.  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 Did gene testing today, and having a biopsy on left breast tomorrow. Presently planning for right breast mastectomy but awaiting gene testing results. Pt had right mastectomy with SLNB and tissue expanders on 04/28/2020.     Currently in Pain? No/denies                  L-DEX FLOWSHEETS - 07/27/20 1600      L-DEX LYMPHEDEMA SCREENING   Measurement Type Unilateral    L-DEX MEASUREMENT EXTREMITY Upper Extremity    POSITION  Standing    DOMINANT SIDE Right    At Risk Side Right    BASELINE SCORE (UNILATERAL) -2.7    L-DEX  SCORE (UNILATERAL) -6.6    VALUE CHANGE (UNILAT) -3.9                     [facility] Adult PT Treatment/Exercise - 07/27/20 0001      Shoulder Exercises: Sidelying   Other Sidelying Exercises elbow flex 2 x 10 2#      Shoulder Exercises: Standing   Extension Strengthening;Both;10 reps    Theraband Level (Shoulder Extension) Level 2 (Red)    Row Both;10 reps    Theraband Level (Shoulder Row) Level 2 (Red)    Other Standing Exercises standing lat stretch at counter/doorway x 2 ea      Manual Therapy   Soft tissue mobilization to the pectoralis, and lateral expander border and in SL to lats,lateral border and interscapular area.    Myofascial Release Rt axilla    Passive ROM to the Rt shoulder into flexion, abduction, ER   vcs for relaxation                       PT Long Term Goals - 06/24/20  1647      PT LONG TERM GOAL #1   Title Pt will be independent with HEP to increase right shoulder ROM and strength    Time 6    Period Weeks    Status Achieved      PT LONG TERM GOAL #2   Title Pt will have right shoulder flexion and abduction within 10 degrees of baseline    Baseline abd 175, flex 165    Time 6    Period Weeks    Status New      PT LONG TERM GOAL #3   Title Pts quick dash will be no greater than 10%    Baseline 36%    Time 6    Period Weeks    Status New      PT LONG TERM GOAL #4   Title Pt will be fit for compression garments prn (sleeve,bra)    Time 6    Period Weeks    Status New      PT LONG TERM GOAL #5   Title Pt will have decreased pain/tightness by greater than 50%    Baseline now 35%    Period Weeks    Status New                 Plan - 07/27/20 1705    Clinical Impression Statement Therapy consisted of soft tissue mobilization with particular attention to her lats as she was very tight and tender there, pecs and lateral expander border and PROM to her right shoulder.  She is maintaining good shoulder ROM. We also performed her SOZO screen as she was overdo and she remains well within the green zone.  She used good form with theraband exs and was given a red band to use at home. Discussed returning to the gym but advised to avoid Wt. machines.  Suggested we instruct in ABC class here for wt activities    Personal Factors and Comorbidities Comorbidity 1    Comorbidities Right breast CA with expanders    Examination-Activity Limitations Reach Overhead;Sleep;Dressing    Examination-Participation Restrictions Driving    Stability/Clinical Decision Making Stable/Uncomplicated    Rehab Potential Excellent    PT Frequency 2x / week    PT Duration 6 weeks    PT Treatment/Interventions ADLs/Self Care Home Management;Therapeutic exercise;Manual techniques;Patient/family education;Manual lymph drainage;Scar mobilization;Passive range of motion  PT Next Visit Plan Cont STM pecs, lats, PROM, pulleys,start ABC strength handout    PT Home Exercise Plan 4 post op exercises, supine wand flex/scaption, corner stretch, standing band(SR, shoulder extension, bilateral ER)    Consulted and Agree with Plan of Care Patient           Patient will benefit from skilled therapeutic intervention in order to improve the following deficits and impairments:  Decreased activity tolerance,Decreased knowledge of precautions,Decreased range of motion,Decreased strength,Decreased scar mobility,Increased edema,Impaired sensation,Postural dysfunction,Impaired UE functional use,Pain  Visit Diagnosis: Malignant neoplasm of overlapping sites of right breast (HCC)  Stiffness of right shoulder, not elsewhere classified  Abnormal posture  Aftercare following surgery for neoplasm  Malignant neoplasm of upper-outer quadrant of right breast in female, estrogen receptor positive ([place])  Acute pain of right shoulder  Estrogen receptor positive     Problem List Patient Active Problem List   Diagnosis Date Noted  . Genetic testing 03/08/2020  . Malignant neoplasm of overlapping sites of right breast ([place]) 02/25/2020  . Family history of breast cancer     Claris Pong 07/27/2020, 5:11 PM  [place], [place], [address] Phone: [phone]   Fax:  [phone]  Name: Gail Erickson MRN: [id number] Date of Birth: 1975/07/24  Cheral Almas, PT 07/27/20 5:14 PM

## 2020-07-29 ENCOUNTER — Ambulatory Visit: Payer: BC Managed Care – PPO | Admitting: Rehabilitation

## 2020-07-29 ENCOUNTER — Encounter: Payer: Self-pay | Admitting: Rehabilitation

## 2020-07-29 ENCOUNTER — Other Ambulatory Visit: Payer: Self-pay

## 2020-07-29 DIAGNOSIS — C50811 Malignant neoplasm of overlapping sites of right female breast: Secondary | ICD-10-CM

## 2020-07-29 DIAGNOSIS — R293 Abnormal posture: Secondary | ICD-10-CM | POA: Diagnosis not present

## 2020-07-29 DIAGNOSIS — C50411 Malignant neoplasm of upper-outer quadrant of right female breast: Secondary | ICD-10-CM | POA: Diagnosis not present

## 2020-07-29 DIAGNOSIS — M25511 Pain in right shoulder: Secondary | ICD-10-CM | POA: Diagnosis not present

## 2020-07-29 DIAGNOSIS — Z483 Aftercare following surgery for neoplasm: Secondary | ICD-10-CM

## 2020-07-29 DIAGNOSIS — Z170 Estrogen receptor positive status [ER+]: Secondary | ICD-10-CM | POA: Diagnosis not present

## 2020-07-29 DIAGNOSIS — M25611 Stiffness of right shoulder, not elsewhere classified: Secondary | ICD-10-CM

## 2020-07-29 NOTE — Therapy (Signed)
[place], [place], [address] Phone: [phone]   Fax:  [phone]  Physical Therapy Treatment  Patient Details  Name: Gail Erickson MRN: [id number] Date of Birth: [birth year] Referring Provider (PT): Dr. Marlou Starks   Encounter Date: 07/29/2020   PT End of Session - 07/29/20 1654    Visit Number 10    Date for PT Re-Evaluation 09/16/20    PT Start Time 1600    PT Stop Time 1645    PT Time Calculation (min) 45 min    Activity Tolerance Patient tolerated treatment well    Behavior During Therapy [facility] for tasks assessed/performed           Past Medical History:  Diagnosis Date  . Abnormal Pap smear of cervix 06-02-14   LGSIL:neg HR HPV (1st abn. pap)  . Cyst of right breast    --stable in 2015 U/S done in [place], [place]  . Elevated DHEA ([place]) 05/21/15   level = 343  . Family history of breast cancer   . 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 Migraine without aura   . Pre-diabetes   . Prediabetes 2021   sees Dr.Balan  . Vitamin D deficiency 05/21/15   level = 10    Past Surgical History:  Procedure Laterality Date  . BREAST RECONSTRUCTION WITH PLACEMENT OF TISSUE EXPANDER AND FLEX HD (ACELLULAR HYDRATED DERMIS) Right 04/28/2020   Procedure: IMMEDIATE RIGHT BREAST RECONSTRUCTION WITH PLACEMENT OF TISSUE EXPANDER AND FLEX HD (ACELLULAR HYDRATED DERMIS);  Surgeon: Wallace Going, DO;  Location: [facility];  Service: Plastics;  Laterality: Right;  . COLPOSCOPY    . MASTECTOMY W/ SENTINEL NODE BIOPSY Right 04/28/2020   Procedure: RIGHT MASTECTOMY WITH SENTINEL LYMPH NODE BIOPSY;  Surgeon: [name], MD;  Location: [facility];  Service: General;  Laterality: Right;  PECTORAL BLOCK  . UMBILICAL HERNIA REPAIR     --as a child    There were no vitals filed for this visit.   Subjective Assessment - 07/29/20 1558    Subjective no complaints    Pertinent  History December 6th learned of Cancer from a biopsy done on Dec 3.  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 Did gene testing today, and having a biopsy on left breast tomorrow. Presently planning for right breast mastectomy but awaiting gene testing results. Pt had right mastectomy with SLNB and tissue expanders on 04/28/2020. I am able to sleep on my right arm longer now, but sometimes my arm feels heavy/tingling when I wake up but it resolves quickly. Started back to the exercises last week.    Currently in Pain? No/denies                             [facility] Adult PT Treatment/Exercise - 07/29/20 0001      Shoulder Exercises: Sidelying   ABduction AROM;Right;5 reps   with latissimus release     Shoulder Exercises: Pulleys   Flexion 2 minutes    ABduction 2 minutes      Shoulder Exercises: Stretch   Table Stretch - Flexion 3 reps;10 seconds    Table Stretch -Flexion Limitations lat stretch at sink    Other Shoulder Stretches at wall ABC tricep stretch x 20"    Other Shoulder Stretches LTR with goalpost arms 2 x10"      Manual Therapy   Soft tissue mobilization to the  pectoralis, and lateral expander border and in SL to lats,lateral border and interscapular area.    Myofascial Release Rt axilla    Passive ROM to the Rt shoulder into flexion, abduction, ER   vcs for relaxation                       PT Long Term Goals - 06/24/20 1647      PT LONG TERM GOAL #1   Title Pt will be independent with HEP to increase right shoulder ROM and strength    Time 6    Period Weeks    Status Achieved      PT LONG TERM GOAL #2   Title Pt will have right shoulder flexion and abduction within 10 degrees of baseline    Baseline abd 175, flex 165    Time 6    Period Weeks    Status New      PT LONG TERM GOAL #3   Title Pts quick dash will be no greater than 10%    Baseline 36%    Time 6    Period Weeks    Status New      PT LONG TERM GOAL #4   Title Pt will be fit for  compression garments prn (sleeve,bra)    Time 6    Period Weeks    Status New      PT LONG TERM GOAL #5   Title Pt will have decreased pain/tightness by greater than 50%    Baseline now 35%    Period Weeks    Status New                 Plan - 07/29/20 1654    Clinical Impression Statement Was going to start strength ABC but pt was dressed for a funeral after PT so focused on stretching and MT today instead and will start ABC next visit.  less restriction noted in the pectoralis and latissimus to this PT today and pt having less pain at the lateral expander.  overall improvements.    PT Frequency 2x / week    PT Duration 6 weeks    PT Treatment/Interventions ADLs/Self Care Home Management;Therapeutic exercise;Manual techniques;Patient/family education;Manual lymph drainage;Scar mobilization;Passive range of motion    PT Next Visit Plan Cont STM pecs, lats, PROM, start ABC strength handout    Consulted and Agree with Plan of Care Patient           Patient will benefit from skilled therapeutic intervention in order to improve the following deficits and impairments:     Visit Diagnosis: Malignant neoplasm of overlapping sites of right breast (HCC)  Stiffness of right shoulder, not elsewhere classified  Abnormal posture  Aftercare following surgery for neoplasm     Problem List Patient Active Problem List   Diagnosis Date Noted  . Genetic testing 03/08/2020  . Malignant neoplasm of overlapping sites of right breast ([place]) 02/25/2020  . Family history of breast cancer     Stark Bray 07/29/2020, 4:59 PM  [place], [place], [address] Phone: [phone]   Fax:  [phone]  Name: Cherrell Maybee MRN: [id number] Date of Birth: 03/16/1975

## 2020-07-30 LAB — CYTOLOGY - PAP
Chlamydia: NEGATIVE
Comment: NEGATIVE
Comment: NEGATIVE
Comment: NORMAL
Diagnosis: UNDETERMINED — AB
High risk HPV: NEGATIVE
Neisseria Gonorrhea: NEGATIVE

## 2020-08-02 NOTE — Progress Notes (Signed)
Gail Erickson   [phone] [phone]   Clinic Follow up Note   Patient Care Team: Patient, No Pcp Per (Inactive) as PCP - General ([place]) [name], MD as Consulting Physician (Obstetrics and Gynecology) [name], MD as Consulting Physician (General Surgery) [name], MD as Consulting Physician (Hematology) [name], RN as Oncology Nurse Navigator [name], RN as Oncology Nurse Navigator  Date of Service:  08/06/2020  CHIEF COMPLAINT: F/u of right breast cancer   SUMMARY OF ONCOLOGIC HISTORY: Oncology History Overview Note  Cancer Staging Malignant neoplasm of overlapping sites of right breast [facility]) Staging form: Breast, AJCC 8th Edition - Clinical stage from 02/25/2020: Stage IIB (cT3, cN0, cM0, G3, ER+, PR+, HER2-) - Signed by [name], MD on 02/25/2020 - Pathologic stage from 04/28/2020: Stage IA (pT1a, pN0, cM0, G2, ER+, PR+, HER2-) - Signed by [name], MD on 05/06/2020 Histologic grading system: 3 grade system    Malignant neoplasm of overlapping sites of right breast ([place])  01/30/2020 Mammogram   Mammogram 01/30/20  IMPRESSION: 1. 7.3 x 5.7 x 3.0 cm area of indeterminate calcifications in medial right breast, involving the lower inner and upper inner quadrants, these extend from the retroareolar region posteriorly, in a segmental distribution, suspicious for DCIS. 2. Two probably benign left breast masses and 2 probably benign left axillary lymph nodes with borderline cortical thickening. 3. Benign right breast cyst.   02/13/2020 Initial Biopsy   Diagnosis 02/13/20 1. Breast, right, needle core biopsy, posterior extent - DUCTAL CARCINOMA IN SITU WITH CALCIFICATIONS AND NECROSIS - SEE COMMENT 2. Breast, right, needle core biopsy, mid-anterior extent - INVASIVE DUCTAL CARCINOMA - DUCTAL CARCINOMA IN SITU - SEE COMMENT Microscopic Comment 1. Based on the biopsy, the ductal carcinoma  in situ has a solid pattern, high nuclear grade and measures 0.5 cm in greatest linear extent. Prognostic markers (ER/PR) are pending and will be reported in an addendum. Dr. Jeannie Done reviewed the case and agrees with the above diagnosis. These results were called to [facility] on February 16, 2020. 2. Based on the biopsy, the carcinoma appears Nottingham grade 3 of 3 and measures 0.15 cm in greatest linear extent. Prognostic markers (ER/PR/ki-67/HER2) are pending and will be reported in an addendum.   02/13/2020 Receptors her2    1. PROGNOSTIC INDICATORS Results: IMMUNOHISTOCHEMICAL AND MORPHOMETRIC ANALYSIS PERFORMED MANUALLY Estrogen Receptor: >95%, POSITIVE, MODERATE-STRONG STAINING INTENSITY Progesterone Receptor: 5%, POSITIVE, MODERATE-STRONG STAINING INTENSITY  2. PROGNOSTIC INDICATORS Results: IMMUNOHISTOCHEMICAL AND MORPHOMETRIC ANALYSIS PERFORMED MANUALLY The tumor cells are NEGATIVE for Her2 (0). Estrogen Receptor: >95%, POSITIVE, STRONG STAINING INTENSITY Progesterone Receptor: 60%, POSITIVE, STRONG STAINING INTENSITY Proliferation Marker Ki67: 1%   02/21/2020 Breast MRI   Breast MRI 02/21/20  IMPRESSION: 1. Involving the sites of biopsy proven DCIS in the medial right breast there is a large area of ill-defined non mass enhancement middle to anterior depth, somewhat difficult to measure due to irregular margins and background enhancement but measures at least 6.3 x 3.3 x 2.0 cm. 2. No MRI evidence of malignancy in the left breast. 3. No axillary adenopathy.   02/25/2020 Initial Diagnosis   Malignant neoplasm of overlapping sites of right breast ([place])   02/25/2020 Cancer Staging   Staging form: Breast, AJCC 8th Edition - Clinical stage from 02/25/2020: Stage IIB (cT3, cN0, cM0, G3, ER+, PR+, HER2-) - Signed by [name], MD on 02/25/2020   02/27/2020 Pathology Results   Diagnosis Lymph  node, needle/core biopsy, axillary - NO CARCINOMA  IDENTIFIED IN NODAL TISSUE Microscopic Comment These results were called to [facility] on March 09, 2020.   04/28/2020 Surgery   RIGHT MASTECTOMY WITH SENTINEL LYMPH NODE BIOPSY by DR Marlou Starks  IMMEDIATE RIGHT BREAST RECONSTRUCTION WITH PLACEMENT OF TISSUE EXPANDER AND FLEX HD (ACELLULAR HYDRATED DERMIS) by Dr Marla Roe   04/28/2020 Pathology Results   FINAL MICROSCOPIC DIAGNOSIS:   A. 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 LYMPH NODE, RIGHT AXILLARY, SENTINEL, EXCISION:  -  No carcinoma identified in one lymph node (0/1)    04/28/2020 Cancer Staging   Staging form: Breast, AJCC 8th Edition - Pathologic stage from 04/28/2020: Stage IA (pT1a, pN0, cM0, G2, ER+, PR+, HER2-) - Signed by [name], MD on 05/06/2020 Histologic grading system: 3 grade system   07/10/2020 -  Anti-estrogen oral therapy   Tamoxifen $RemoveBe'20mg'ZwhOQaCzB$  once daily starting 07/10/20      CURRENT THERAPY:  Tamoxifen $RemoveBe'20mg'WsATqIznk$  once daily starting 07/10/20  INTERVAL HISTORY:  Gail Erickson is here for a follow up of right breast cancer. She was last seen by me 3 months ago. She presents to the clinic alone. She notes she is doing well. She notes she started Tamoxifen on 4/31/22. She notes she has had hot flashes which will wake her up at night. She denies joint pain She notes around this time she had COVID infection with mild to moderate symptoms. She received COVID treatment. She feels she has now recovered from [place]. She notes her hot flashes/night sweats have improved.  She plans for reconstruction surgery on 09/23/20 with Dr Marla Roe. She continues with PT. She notes her 07/26/20 shows abnormal cell on path which was not cancerous.    REVIEW OF  SYSTEMS:   Constitutional: Denies fevers, chills or abnormal weight loss (+) Hot flashes/night sweats  Eyes: Denies blurriness of vision Ears, nose, mouth, throat, and face: Denies mucositis or sore throat Respiratory: Denies cough, dyspnea or wheezes Cardiovascular: Denies palpitation, chest discomfort or lower extremity swelling Gastrointestinal:  Denies nausea, heartburn or change in bowel habits Skin: Denies abnormal skin rashes Lymphatics: Denies new lymphadenopathy or easy bruising Neurological:Denies numbness, tingling or new weaknesses Behavioral/Psych: Mood is stable, no new changes  All other systems were reviewed with the patient and are negative.  MEDICAL HISTORY:  Past Medical History:  Diagnosis Date  . Abnormal Pap smear of cervix 06-02-14   LGSIL:neg HR HPV (1st abn. pap)  . Cyst of right breast    --stable in 2015 U/S done in [place], [place]  . Elevated DHEA ([place]) 05/21/15   level = 343  . Family history of breast cancer   . 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 Migraine without aura   . Pre-diabetes   . Prediabetes 2021   sees Dr.Balan  . Vitamin D deficiency 05/21/15   level = 10    SURGICAL HISTORY: Past Surgical History:  Procedure Laterality Date  . BREAST RECONSTRUCTION WITH PLACEMENT OF TISSUE EXPANDER AND FLEX HD (ACELLULAR HYDRATED DERMIS) Right 04/28/2020   Procedure: IMMEDIATE RIGHT BREAST RECONSTRUCTION WITH PLACEMENT OF TISSUE EXPANDER AND FLEX HD (ACELLULAR HYDRATED  DERMIS);  Surgeon: Wallace Going, DO;  Location: [facility];  Service: Plastics;  Laterality: Right;  . COLPOSCOPY    . MASTECTOMY W/ SENTINEL NODE BIOPSY Right 04/28/2020   Procedure: RIGHT MASTECTOMY WITH SENTINEL LYMPH NODE BIOPSY;  Surgeon: [name], MD;  Location: [facility];  Service: General;  Laterality: Right;  PECTORAL BLOCK  . UMBILICAL HERNIA REPAIR     --as a child    I have reviewed the social history and family  history with the patient and they are unchanged from previous note.  ALLERGIES:  has No Known Allergies.  MEDICATIONS:  Current Outpatient Medications  Medication Sig Dispense Refill  . Ascorbic Acid (VITAMIN C) 1000 MG tablet Take 1,000 mg by mouth daily.    . Aspirin-Acetaminophen-Caffeine (EXCEDRIN MIGRAINE PO) Take 1 tablet by mouth as needed.    . diazepam (VALIUM) 2 MG tablet Take 1 tablet (2 mg total) by mouth every 12 (twelve) hours as needed for muscle spasms. 20 tablet 0  . ibuprofen (ADVIL) 600 MG tablet Take 1 tablet (600 mg total) by mouth every 6 (six) hours as needed for mild pain or moderate pain. For use AFTER surgery 30 tablet 0  . Multiple Vitamin (MULTIVITAMIN) tablet Take 1 tablet by mouth daily.    . Omega-3 Fatty Acids (FISH OIL) 1000 MG CAPS Take by mouth.    . Probiotic Product (PROBIOTIC-10 PO) Take 1 tablet by mouth.    . pseudoephedrine (SUDAFED) 30 MG tablet Take 30 mg by mouth every 4 (four) hours as needed for congestion.    . tamoxifen (NOLVADEX) 20 MG tablet Take 1 tablet (20 mg total) by mouth daily. 30 tablet 3  . VITAMIN D PO Take 1,000 Int'l Units by mouth.     No current facility-administered medications for this visit.    PHYSICAL EXAMINATION: ECOG PERFORMANCE STATUS: 0 - Asymptomatic  Vitals:   08/06/20 0916  BP: 122/74  Pulse: 76  Resp: 20  Temp: (!) 97.2 F (36.2 C)  SpO2: 100%   Filed Weights   08/06/20 0916  Weight: 232 lb 14.4 oz (105.6 kg)    GENERAL:alert, no distress and comfortable SKIN: skin color, texture, turgor are normal, no rashes or significant lesions EYES: normal, Conjunctiva are pink and non-injected, sclera clear  NECK: supple, thyroid normal size, non-tender, without nodularity LYMPH:  no palpable lymphadenopathy in the cervical, axillary  LUNGS: clear to auscultation and percussion with normal breathing effort HEART: regular rate & rhythm and no murmurs and no lower extremity edema ABDOMEN:abdomen soft,  non-tender and normal bowel sounds Musculoskeletal:no cyanosis of digits and no clubbing  NEURO: alert & oriented x 3 with fluent speech, no focal motor/sensory deficits BREAST: S/p right mastectomy with tissue expander in place: surgical incision healed well. No palpable mass, nodules or adenopathy bilaterally. Breast exam benign.   LABORATORY DATA:  I have reviewed the data as listed CBC Latest Ref Rng & Units 08/06/2020 07/26/2020 02/26/2020  WBC 4.0 - 10.5 K/uL 3.9(L) 4.5 4.2  Hemoglobin 12.0 - 15.0 g/dL 11.4(L) 11.8 11.8(L)  Hematocrit 36.0 - 46.0 % 32.7(L) 35.1 34.4(L)  Platelets 150 - 400 K/uL 271 286 304     CMP Latest Ref Rng & Units 08/06/2020 07/26/2020 02/26/2020  Glucose 70 - 99 mg/dL 103(H) 86 96  BUN 6 - 20 mg/dL $Remove'8 11 13  'tSrJwGP$ Creatinine 0.44 - 1.00 mg/dL 0.89 0.85 1.03(H)  Sodium 135 - 145 mmol/L 143 140 141  Potassium 3.5 -  5.1 mmol/L 4.1 3.9 4.1  Chloride 98 - 111 mmol/L 109 106 107  CO2 22 - 32 mmol/L $RemoveB'24 26 26  'psflKmPR$ Calcium 8.9 - 10.3 mg/dL 8.8(L) 9.5 9.3  Total Protein 6.5 - 8.1 g/dL 6.9 7.3 7.6  Total Bilirubin 0.3 - 1.2 mg/dL 0.7 0.6 1.1  Alkaline Phos 38 - 126 U/L 45 - 57  AST 15 - 41 U/L $Remo'18 17 18  'kmrgQ$ ALT 0 - 44 U/L $Remo'14 14 18      'YlqrA$ RADIOGRAPHIC STUDIES: I have personally reviewed the radiological images as listed and agreed with the findings in the report. No results found.   ASSESSMENT & PLAN:  Gail Erickson is a 45 y.o. 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 Separate right Axillary LN biopsy was negative.  -She underwent right mastectomy with Dr Marlou Starks and Dr Marla Roe on 04/28/20. Surgical path showed invasive ductal carcinoma was 0.5 cm, grade 2, margins were negative, 1 lymph  nodes were negative.  Her surgical stage is IA.  -Given the T1a tumor, she does not need Oncotype, adjuvant chemotherapy or postmastectomy radiation.  -I started her on Tamoxifen $RemoveBefo'20mg'OtqszPdKKJh$  once daily on 07/10/20. She is tolerating with hot flashes.  -She is clinically doing well. Lab reviewed, her CBC and CMP are within normal limits except WBC 3.9, Hg 11.4, ANC 1.6. Her physical exam was unremarkable. There is no clinical concern for recurrence. -She will proceed with reconstruction surgery with Dr Marla Roe on 09/23/20. I advised her to hold Tamoxifen 2 weeks before and 2 weeks after date of surgery to reduce her risk of thrombosis. She is agreeable.  -I advised her to manage her weight with adequate diet and regular exercise. Per pt request, I will refer her back to dietician.  -She will proceed with survivorship clinic in 3 months with NP Lacie and F/u with me in 6 months   2. Family history, Genetic testing negative for BRCA 1/2 -She has an extensive family history of cancer, mainly breast cancer on both sides of her family.  -Her 07/26/20 Pap Smear showed abnormal cells on path, but were not cancerous. She is HPV negative. She will continue to f/u with her Gyn.    3. Pre-Diabetic, Stress -She is seen by Endocrinologist, not on medication  -She is seen by Therapist as needed, not on medication.   4. COVID (+) in 06/2020  -She had mild to moderate symptoms and had treatment with oral medicine. She has recovered fully now.    PLAN: -Proceed with reconstruction surgery on 09/23/20. She will hold Tamoxifen 2 weeks before and 2 weeks afterward.  -Continue Tamoxifen, I refilled today for [facility] only.  -Refer back to [place] clinic with NP Lacie in 3 months  -Lab and F/u with me in 6 months    No problem-specific Assessment & Plan notes found for this encounter.   No orders of the defined types were placed in this encounter.  All questions were answered. The patient  knows to call the clinic with any problems, questions or concerns. No barriers to learning was detected. The total time spent in the appointment was 30 minutes.     [name], MD 08/06/2020   I, Joslyn Devon, am acting as scribe for [name], MD.   I  have reviewed the above documentation for accuracy and completeness, and I agree with the above.

## 2020-08-05 ENCOUNTER — Ambulatory Visit: Payer: Self-pay | Admitting: Rehabilitation

## 2020-08-05 ENCOUNTER — Encounter: Payer: Self-pay | Admitting: Rehabilitation

## 2020-08-05 ENCOUNTER — Other Ambulatory Visit: Payer: Self-pay

## 2020-08-05 DIAGNOSIS — Z170 Estrogen receptor positive status [ER+]: Secondary | ICD-10-CM

## 2020-08-05 DIAGNOSIS — C50811 Malignant neoplasm of overlapping sites of right female breast: Secondary | ICD-10-CM

## 2020-08-05 DIAGNOSIS — R293 Abnormal posture: Secondary | ICD-10-CM | POA: Diagnosis not present

## 2020-08-05 DIAGNOSIS — C50411 Malignant neoplasm of upper-outer quadrant of right female breast: Secondary | ICD-10-CM

## 2020-08-05 DIAGNOSIS — M25511 Pain in right shoulder: Secondary | ICD-10-CM | POA: Diagnosis not present

## 2020-08-05 DIAGNOSIS — Z483 Aftercare following surgery for neoplasm: Secondary | ICD-10-CM

## 2020-08-05 DIAGNOSIS — M25611 Stiffness of right shoulder, not elsewhere classified: Secondary | ICD-10-CM | POA: Diagnosis not present

## 2020-08-05 NOTE — Therapy (Signed)
Gail Erickson, [place], [address] Phone: [phone]   Fax:  [phone]  Physical Therapy Treatment  Patient Details  Name: Gail Erickson MRN: [id number] Date of Birth: 08-26-1975 Referring Provider (PT): Dr. Marlou Starks   Encounter Date: 08/05/2020   PT End of Session - 08/05/20 1415    Visit Number 11    Number of Visits 16    Date for PT Re-Evaluation 09/16/20    PT Start Time 4709    PT Stop Time 1348    PT Time Calculation (min) 45 min    Activity Tolerance Patient tolerated treatment well    Behavior During Therapy [facility] for tasks assessed/performed           Past Medical History:  Diagnosis Date  . Abnormal Pap smear of cervix 06-02-14   LGSIL:neg HR HPV (1st abn. pap)  . Cyst of right breast    --stable in 2015 U/S done in [place], [place]  . Elevated DHEA ([place]) 05/21/15   level = 343  . Family history of breast cancer   . 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 Migraine without aura   . Pre-diabetes   . Prediabetes 2021   sees Dr.Balan  . Vitamin D deficiency 05/21/15   level = 10    Past Surgical History:  Procedure Laterality Date  . BREAST RECONSTRUCTION WITH PLACEMENT OF TISSUE EXPANDER AND FLEX HD (ACELLULAR HYDRATED DERMIS) Right 04/28/2020   Procedure: IMMEDIATE RIGHT BREAST RECONSTRUCTION WITH PLACEMENT OF TISSUE EXPANDER AND FLEX HD (ACELLULAR HYDRATED DERMIS);  Surgeon: Wallace Going, DO;  Location: [facility];  Service: Plastics;  Laterality: Right;  . COLPOSCOPY    . MASTECTOMY W/ SENTINEL NODE BIOPSY Right 04/28/2020   Procedure: RIGHT MASTECTOMY WITH SENTINEL LYMPH NODE BIOPSY;  Surgeon: [name], MD;  Location: [facility];  Service: General;  Laterality: Right;  PECTORAL BLOCK  . UMBILICAL HERNIA REPAIR     --as a child    There were no vitals filed for this visit.   Subjective Assessment - 08/05/20 1304    Subjective I  am good    Pertinent History December 6th learned of Cancer from a biopsy done on Dec 3.  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 Did gene testing today, and having a biopsy on left breast tomorrow. Presently planning for right breast mastectomy but awaiting gene testing results. Pt had right mastectomy with SLNB and tissue expanders on 04/28/2020. I am able to sleep on my right arm longer now, but sometimes my arm feels heavy/tingling when I wake up but it resolves quickly. Started back to the exercises last week.    Currently in Pain? No/denies                             [facility] Adult PT Treatment/Exercise - 08/05/20 0001      Exercises   Exercises Other Exercises    Other Exercises  went over all stretches and exercises in strength ABC program except quadruped alt UE/LE holding each stretch x 15" and each exercise x 10.  All weighted exercises were performed with 3#.  Pt was given demo, vcs, and tcs as needed to complete each.      Manual Therapy   Passive ROM to the Rt shoulder into flexion, abduction, ER   vcs for relaxation  PT Education - 08/05/20 1415    Education Details strength ABC    Person(s) Educated Patient    Methods Explanation;Demonstration;Tactile cues;Verbal cues;Handout    Comprehension Verbalized understanding;Returned demonstration;Verbal cues required;Tactile cues required               PT Long Term Goals - 06/24/20 1647      PT LONG TERM GOAL #1   Title Pt will be independent with HEP to increase right shoulder ROM and strength    Time 6    Period Weeks    Status Achieved      PT LONG TERM GOAL #2   Title Pt will have right shoulder flexion and abduction within 10 degrees of baseline    Baseline abd 175, flex 165    Time 6    Period Weeks    Status New      PT LONG TERM GOAL #3   Title Pts quick dash will be no greater than 10%    Baseline 36%    Time 6    Period Weeks    Status New      PT LONG TERM GOAL #4   Title  Pt will be fit for compression garments prn (sleeve,bra)    Time 6    Period Weeks    Status New      PT LONG TERM GOAL #5   Title Pt will have decreased pain/tightness by greater than 50%    Baseline now 35%    Period Weeks    Status New                 Plan - 08/05/20 1416    Clinical Impression Statement Pt was able to perform strength ABC without difficulty and with good form for each.  Continued PROM to the Rt shoulder today with some remaining End range stiffness but overall pt is doing very well.    PT Frequency 2x / week    PT Duration 6 weeks    PT Treatment/Interventions ADLs/Self Care Home Management;Therapeutic exercise;Manual techniques;Patient/family education;Manual lymph drainage;Scar mobilization;Passive range of motion    PT Next Visit Plan Cont STM pecs, lats, PROM,    Consulted and Agree with Plan of Care Patient           Patient will benefit from skilled therapeutic intervention in order to improve the following deficits and impairments:     Visit Diagnosis: Malignant neoplasm of overlapping sites of right breast ([facility])  Stiffness of right shoulder, not elsewhere classified  Abnormal posture  Aftercare following surgery for neoplasm  Malignant neoplasm of upper-outer quadrant of right breast in female, estrogen receptor positive ([place])     Problem List Patient Active Problem List   Diagnosis Date Noted  . Genetic testing 03/08/2020  . Malignant neoplasm of overlapping sites of right breast ([place]) 02/25/2020  . Family history of breast cancer     Stark Bray 08/05/2020, 2:17 PM  [place], [place], [address] Phone: [phone]   Fax:  [phone]  Name: Gail Erickson MRN: [id number] Date of Birth: 11-06-1975

## 2020-08-06 ENCOUNTER — Encounter: Payer: Self-pay | Admitting: Surgical

## 2020-08-06 ENCOUNTER — Inpatient Hospital Stay: Payer: BC Managed Care – PPO | Attending: Hematology

## 2020-08-06 ENCOUNTER — Inpatient Hospital Stay (HOSPITAL_BASED_OUTPATIENT_CLINIC_OR_DEPARTMENT_OTHER): Payer: BC Managed Care – PPO | Admitting: Hematology

## 2020-08-06 ENCOUNTER — Encounter: Payer: Self-pay | Admitting: Hematology

## 2020-08-06 ENCOUNTER — Ambulatory Visit (INDEPENDENT_AMBULATORY_CARE_PROVIDER_SITE_OTHER): Payer: BC Managed Care – PPO | Admitting: Surgical

## 2020-08-06 ENCOUNTER — Telehealth: Payer: Self-pay | Admitting: Hematology

## 2020-08-06 ENCOUNTER — Other Ambulatory Visit: Payer: Self-pay

## 2020-08-06 VITALS — BP 122/74 | HR 76 | Temp 97.2°F | Resp 20 | Ht 67.75 in | Wt 232.9 lb

## 2020-08-06 DIAGNOSIS — Z170 Estrogen receptor positive status [ER+]: Secondary | ICD-10-CM | POA: Insufficient documentation

## 2020-08-06 DIAGNOSIS — Z79810 Long term (current) use of selective estrogen receptor modulators (SERMs): Secondary | ICD-10-CM | POA: Diagnosis not present

## 2020-08-06 DIAGNOSIS — C50811 Malignant neoplasm of overlapping sites of right female breast: Secondary | ICD-10-CM | POA: Diagnosis not present

## 2020-08-06 DIAGNOSIS — N951 Menopausal and female climacteric states: Secondary | ICD-10-CM | POA: Insufficient documentation

## 2020-08-06 LAB — CBC WITH DIFFERENTIAL (CANCER CENTER ONLY)
Abs Immature Granulocytes: 0 10*3/uL (ref 0.00–0.07)
Basophils Absolute: 0.1 10*3/uL (ref 0.0–0.1)
Basophils Relative: 1 %
Eosinophils Absolute: 0.2 10*3/uL (ref 0.0–0.5)
Eosinophils Relative: 4 %
HCT: 32.7 % — ABNORMAL LOW (ref 36.0–46.0)
Hemoglobin: 11.4 g/dL — ABNORMAL LOW (ref 12.0–15.0)
Immature Granulocytes: 0 %
Lymphocytes Relative: 44 %
Lymphs Abs: 1.7 10*3/uL (ref 0.7–4.0)
MCH: 29.4 pg (ref 26.0–34.0)
MCHC: 34.9 g/dL (ref 30.0–36.0)
MCV: 84.3 fL (ref 80.0–100.0)
Monocytes Absolute: 0.4 10*3/uL (ref 0.1–1.0)
Monocytes Relative: 9 %
Neutro Abs: 1.6 10*3/uL — ABNORMAL LOW (ref 1.7–7.7)
Neutrophils Relative %: 42 %
Platelet Count: 271 10*3/uL (ref 150–400)
RBC: 3.88 MIL/uL (ref 3.87–5.11)
RDW: 13.6 % (ref 11.5–15.5)
WBC Count: 3.9 10*3/uL — ABNORMAL LOW (ref 4.0–10.5)
nRBC: 0 % (ref 0.0–0.2)

## 2020-08-06 LAB — CMP (CANCER CENTER ONLY)
ALT: 14 U/L (ref 0–44)
AST: 18 U/L (ref 15–41)
Albumin: 3.7 g/dL (ref 3.5–5.0)
Alkaline Phosphatase: 45 U/L (ref 38–126)
Anion gap: 10 (ref 5–15)
BUN: 8 mg/dL (ref 6–20)
CO2: 24 mmol/L (ref 22–32)
Calcium: 8.8 mg/dL — ABNORMAL LOW (ref 8.9–10.3)
Chloride: 109 mmol/L (ref 98–111)
Creatinine: 0.89 mg/dL (ref 0.44–1.00)
GFR, Estimated: >60 mL/min (ref >60)
Glucose, Bld: 103 mg/dL — ABNORMAL HIGH (ref 70–99)
Potassium: 4.1 mmol/L (ref 3.5–5.1)
Sodium: 143 mmol/L (ref 135–145)
Total Bilirubin: 0.7 mg/dL (ref 0.3–1.2)
Total Protein: 6.9 g/dL (ref 6.5–8.1)

## 2020-08-06 MED ORDER — TAMOXIFEN CITRATE 20 MG PO TABS: 20.0000 mg | ORAL_TABLET | Freq: Every day | ORAL | 3 refills | Status: DC

## 2020-08-06 NOTE — Telephone Encounter (Signed)
Scheduled per los. Confirmed appt. declined printout

## 2020-08-06 NOTE — Progress Notes (Signed)
Patient is a 45 year old female here for follow-up on her right breast reconstruction.  She had placement of her tissue expander on 04/28/2020.  She is currently scheduled for removal of the right breast tissue expander and placement of right breast implant on 09/23/2020.  Patient reports she is overall doing well.  She reports that she did well after her last fill.  She is interested in additional fill today.  She has some questions about the planned procedure.   Chaperone present on exam Physical Exam Exam conducted with a chaperone present.  Constitutional:      General: She is not in acute distress.    Appearance: Normal appearance. She is not ill-appearing.  HENT:     Head: Normocephalic and atraumatic.  Chest:    Skin:    General: Skin is warm and dry.  Neurological:     Mental Status: She is alert.  Psychiatric:        Mood and Affect: Mood normal.        Behavior: Behavior normal.      We placed injectable saline in the Expander using a sterile technique: Right: 50 cc for a total of 625/ 535 cc  Pictures were obtained of the patient and placed in the chart with the patient's or guardian's permission.  Patient is scheduled for exchange of right breast tissue expander for right breast implant on 09/23/2020.  We discussed saline versus silicone implants and patient is unsure if she would like to go with silicone or saline.  She is leaning towards saline, but she is going to think about this over the next week and call us with her decision.  I also provided her with the [facility] checklist to review implant information.  Pictures were obtained of the patient and placed in the chart with the patient's or guardian's permission.

## 2020-08-07 ENCOUNTER — Other Ambulatory Visit: Payer: Self-pay | Admitting: Hematology

## 2020-08-07 DIAGNOSIS — C50811 Malignant neoplasm of overlapping sites of right female breast: Secondary | ICD-10-CM

## 2020-08-11 ENCOUNTER — Other Ambulatory Visit: Payer: Self-pay

## 2020-08-11 ENCOUNTER — Ambulatory Visit: Payer: BC Managed Care – PPO | Attending: General Surgery

## 2020-08-11 DIAGNOSIS — R293 Abnormal posture: Secondary | ICD-10-CM | POA: Diagnosis not present

## 2020-08-11 DIAGNOSIS — C50411 Malignant neoplasm of upper-outer quadrant of right female breast: Secondary | ICD-10-CM | POA: Diagnosis not present

## 2020-08-11 DIAGNOSIS — C50811 Malignant neoplasm of overlapping sites of right female breast: Secondary | ICD-10-CM | POA: Diagnosis not present

## 2020-08-11 DIAGNOSIS — M25511 Pain in right shoulder: Secondary | ICD-10-CM | POA: Insufficient documentation

## 2020-08-11 DIAGNOSIS — Z170 Estrogen receptor positive status [ER+]: Secondary | ICD-10-CM

## 2020-08-11 DIAGNOSIS — M25611 Stiffness of right shoulder, not elsewhere classified: Secondary | ICD-10-CM | POA: Insufficient documentation

## 2020-08-11 DIAGNOSIS — F4320 Adjustment disorder, unspecified: Secondary | ICD-10-CM | POA: Diagnosis not present

## 2020-08-11 DIAGNOSIS — Z483 Aftercare following surgery for neoplasm: Secondary | ICD-10-CM

## 2020-08-11 NOTE — Therapy (Signed)
[place], [place], [address] Phone: [phone]   Fax:  [phone]  Physical Therapy Treatment  Patient Details  Name: Gail Erickson MRN: [id number] Date of Birth: August 27, 1975 Referring Provider (PT): Dr. Marlou Starks   Encounter Date: 08/11/2020   PT End of Session - 08/11/20 1359    Visit Number 12    Number of Visits 16    Date for PT Re-Evaluation 09/16/20    PT Start Time 1300    PT Stop Time 1359    PT Time Calculation (min) 59 min    Activity Tolerance Patient tolerated treatment well    Behavior During Therapy [facility] for tasks assessed/performed           Past Medical History:  Diagnosis Date  . Abnormal Pap smear of cervix 06-02-14   LGSIL:neg HR HPV (1st abn. pap)  . Cyst of right breast    --stable in 2015 U/S done in [place], [place]  . Elevated DHEA ([place]) 05/21/15   level = 343  . Family history of breast cancer   . 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 Migraine without aura   . Pre-diabetes   . Prediabetes 2021   sees Dr.Balan  . Vitamin D deficiency 05/21/15   level = 10    Past Surgical History:  Procedure Laterality Date  . BREAST RECONSTRUCTION WITH PLACEMENT OF TISSUE EXPANDER AND FLEX HD (ACELLULAR HYDRATED DERMIS) Right 04/28/2020   Procedure: IMMEDIATE RIGHT BREAST RECONSTRUCTION WITH PLACEMENT OF TISSUE EXPANDER AND FLEX HD (ACELLULAR HYDRATED DERMIS);  Surgeon: Wallace Going, DO;  Location: [facility];  Service: Plastics;  Laterality: Right;  . COLPOSCOPY    . MASTECTOMY W/ SENTINEL NODE BIOPSY Right 04/28/2020   Procedure: RIGHT MASTECTOMY WITH SENTINEL LYMPH NODE BIOPSY;  Surgeon: [name], MD;  Location: [facility];  Service: General;  Laterality: Right;  PECTORAL BLOCK  . UMBILICAL HERNIA REPAIR     --as a child    There were no vitals filed for this visit.   Subjective Assessment - 08/11/20 1257    Subjective Had  a fill last Friday and then went to work.  On Saturday I woke up and was very uncomfortable and heavy.  Saw oncologist on Friday also and I have gained weight with the tamoxifen and to be mindful of it. Feel a little tight on right side today.  Had a sharp pain under expander last night but its gone now.    Pertinent History December 6th learned of Cancer from a biopsy done on Dec 3.  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 Did gene testing today, and having a biopsy on left breast tomorrow. Presently planning for right breast mastectomy but awaiting gene testing results. Pt had right mastectomy with SLNB and tissue expanders on 04/28/2020. I am able to sleep on my right arm longer now, but sometimes my arm feels heavy/tingling when I wake up but it resolves quickly. Started back to the exercises last week.    Patient Stated Goals Reassess right UE post surgery.    Currently in Pain? No/denies    Multiple Pain Sites No                             [facility] Adult PT Treatment/Exercise - 08/11/20 0001      Shoulder Exercises: Standing   External Rotation Strengthening;Both;15 reps  Theraband Level (Shoulder External Rotation) Level 2 (Red)    Flexion Strengthening;Both;10 reps   3# and with scaption   Shoulder Flexion Weight (lbs) 3#    Extension Strengthening;Both;10 reps;15 reps    Theraband Level (Shoulder Extension) Level 2 (Red)    Retraction Strengthening;Both;15 reps    Theraband Level (Shoulder Retraction) Level 2 (Red)    Other Standing Exercises elbow flex 3# x 10    Other Standing Exercises doorway chest stretch 3 x 15 sec      Shoulder Exercises: Therapy Ball   ABduction 10 reps      Shoulder Exercises: Stretch   Table Stretch -Flexion Limitations lat stretch at back of bike x 3      Manual Therapy   Soft tissue mobilization to the pectoralis, and lateral expander border and in SL to lats,lateral border and interscapular area.    Myofascial Release Rt axilla    Passive ROM to  the Rt shoulder into flexion, abduction, ER   vcs for relaxation                       PT Long Term Goals - 06/24/20 1647      PT LONG TERM GOAL #1   Title Pt will be independent with HEP to increase right shoulder ROM and strength    Time 6    Period Weeks    Status Achieved      PT LONG TERM GOAL #2   Title Pt will have right shoulder flexion and abduction within 10 degrees of baseline    Baseline abd 175, flex 165    Time 6    Period Weeks    Status New      PT LONG TERM GOAL #3   Title Pts quick dash will be no greater than 10%    Baseline 36%    Time 6    Period Weeks    Status New      PT LONG TERM GOAL #4   Title Pt will be fit for compression garments prn (sleeve,bra)    Time 6    Period Weeks    Status New      PT LONG TERM GOAL #5   Title Pt will have decreased pain/tightness by greater than 50%    Baseline now 35%    Period Weeks    Status New                 Plan - 08/11/20 1401    Clinical Impression Statement Therapy consisted of MFR, Soft tissue mobilization, PROM and strengthening/stretching activities. Pt continues with tenderness at right lats and lateral expander border.  Good PROM is noted.  She had good tolerance for exercises but was aware of discomfort but not pain after exercises in the lateral breast area.  She used good form and required only occasional VC's    Personal Factors and Comorbidities Comorbidity 1    Comorbidities Right breast CA with expanders    Examination-Activity Limitations Reach Overhead;Sleep;Dressing    Examination-Participation Restrictions Driving    Stability/Clinical Decision Making Stable/Uncomplicated    Rehab Potential Excellent    PT Frequency 2x / week    PT Duration 6 weeks    PT Treatment/Interventions ADLs/Self Care Home Management;Therapeutic exercise;Manual techniques;Patient/family education;Manual lymph drainage;Scar mobilization;Passive range of motion    PT Next Visit Plan Cont STM  pecs, lats, PROM, strength    PT Home Exercise Plan 4 post op exercises, supine wand flex/scaption,  corner stretch, standing band(SR, shoulder extension, bilateral ER)    Recommended Other Services gave pt scrip for compression bra    Consulted and Agree with Plan of Care Patient           Patient will benefit from skilled therapeutic intervention in order to improve the following deficits and impairments:  Decreased activity tolerance,Decreased knowledge of precautions,Decreased range of motion,Decreased strength,Decreased scar mobility,Increased edema,Impaired sensation,Postural dysfunction,Impaired UE functional use,Pain  Visit Diagnosis: Malignant neoplasm of overlapping sites of right breast (HCC)  Stiffness of right shoulder, not elsewhere classified  Abnormal posture  Aftercare following surgery for neoplasm  Malignant neoplasm of upper-outer quadrant of right breast in female, estrogen receptor positive ([place])  Acute pain of right shoulder  Estrogen receptor positive     Problem List Patient Active Problem List   Diagnosis Date Noted  . Genetic testing 03/08/2020  . Malignant neoplasm of overlapping sites of right breast ([place]) 02/25/2020  . Family history of breast cancer     Claris Pong 08/11/2020, 2:05 PM  [facility] [place], [place], [address] Phone: [phone]   Fax:  [phone]  Name: Kyonna Frier MRN: [id number] Date of Birth: Jun 01, 1975 Cheral Almas, PT 08/11/20 2:05 PM

## 2020-08-12 ENCOUNTER — Telehealth: Payer: Self-pay

## 2020-08-12 ENCOUNTER — Encounter: Payer: Self-pay | Admitting: Nurse Practitioner

## 2020-08-12 NOTE — Telephone Encounter (Signed)
Nutrition  Received message from Dr Burr Medico to call patient.    Called patient this am.  No answer. Left message with RD call back number.  Jes Costales B. Zenia Resides, [place], [place] Registered Dietitian [phone] (mobile)

## 2020-08-17 ENCOUNTER — Other Ambulatory Visit: Payer: Self-pay

## 2020-08-17 ENCOUNTER — Ambulatory Visit: Payer: BC Managed Care – PPO | Admitting: Rehabilitation

## 2020-08-17 DIAGNOSIS — C50411 Malignant neoplasm of upper-outer quadrant of right female breast: Secondary | ICD-10-CM | POA: Diagnosis not present

## 2020-08-17 DIAGNOSIS — M25611 Stiffness of right shoulder, not elsewhere classified: Secondary | ICD-10-CM

## 2020-08-17 DIAGNOSIS — R293 Abnormal posture: Secondary | ICD-10-CM | POA: Diagnosis not present

## 2020-08-17 DIAGNOSIS — Z483 Aftercare following surgery for neoplasm: Secondary | ICD-10-CM | POA: Diagnosis not present

## 2020-08-17 DIAGNOSIS — C50811 Malignant neoplasm of overlapping sites of right female breast: Secondary | ICD-10-CM

## 2020-08-17 DIAGNOSIS — Z170 Estrogen receptor positive status [ER+]: Secondary | ICD-10-CM | POA: Diagnosis not present

## 2020-08-17 DIAGNOSIS — M25511 Pain in right shoulder: Secondary | ICD-10-CM | POA: Diagnosis not present

## 2020-08-17 NOTE — Therapy (Signed)
[place], [place], [address] Phone: [phone]   Fax:  [phone]  Physical Therapy Treatment  Patient Details  Name: Gail Erickson MRN: [id number] Date of Birth: [birth year] Referring Provider (PT): Dr. Marlou Starks   Encounter Date: 08/17/2020   PT End of Session - 08/17/20 1721    Visit Number 13    Number of Visits 16    Date for PT Re-Evaluation 09/16/20    PT Start Time 1603    PT Stop Time 1656    PT Time Calculation (min) 53 min    Activity Tolerance Patient tolerated treatment well    Behavior During Therapy [facility] for tasks assessed/performed           Past Medical History:  Diagnosis Date  . Abnormal Pap smear of cervix 06-02-14   LGSIL:neg HR HPV (1st abn. pap)  . Cyst of right breast    --stable in 2015 U/S done in [place], [place]  . Elevated DHEA ([place]) 05/21/15   level = 343  . Family history of breast cancer   . 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 Migraine without aura   . Pre-diabetes   . Prediabetes 2021   sees Dr.Balan  . Vitamin D deficiency 05/21/15   level = 10    Past Surgical History:  Procedure Laterality Date  . BREAST RECONSTRUCTION WITH PLACEMENT OF TISSUE EXPANDER AND FLEX HD (ACELLULAR HYDRATED DERMIS) Right 04/28/2020   Procedure: IMMEDIATE RIGHT BREAST RECONSTRUCTION WITH PLACEMENT OF TISSUE EXPANDER AND FLEX HD (ACELLULAR HYDRATED DERMIS);  Surgeon: Wallace Going, DO;  Location: [facility];  Service: Plastics;  Laterality: Right;  . COLPOSCOPY    . MASTECTOMY W/ SENTINEL NODE BIOPSY Right 04/28/2020   Procedure: RIGHT MASTECTOMY WITH SENTINEL LYMPH NODE BIOPSY;  Surgeon: [name], MD;  Location: [facility];  Service: General;  Laterality: Right;  PECTORAL BLOCK  . UMBILICAL HERNIA REPAIR     --as a child    There were no vitals filed for this visit.   Subjective Assessment - 08/17/20 1603    Subjective It  is back to normal soreness wise.    Pertinent History December 6th learned of Cancer from a biopsy done on Dec 3.  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 Did gene testing today, and having a biopsy on left breast tomorrow. Presently planning for right breast mastectomy but awaiting gene testing results. Pt had right mastectomy with SLNB and tissue expanders on 04/28/2020. I am able to sleep on my right arm longer now, but sometimes my arm feels heavy/tingling when I wake up but it resolves quickly. Started back to the exercises last week.    Currently in Pain? No/denies                             [facility] Adult PT Treatment/Exercise - 08/17/20 0001      Shoulder Exercises: Standing   Flexion Both;20 reps    Shoulder Flexion Weight (lbs) 3#    Flexion Limitations some increased pain lateral expander    ABduction Both;20 reps    Shoulder ABduction Weight (lbs) 3    Row Both;20 reps    Theraband Level (Shoulder Row) Level 2 (Red)    Retraction Both;20 reps    Theraband Level (Shoulder Retraction) Level 2 (Red)    Other Standing Exercises elbow flex 3# 2x10  Shoulder Exercises: Stretch   Other Shoulder Stretches seated lat stretch alternating sides x 5    Other Shoulder Stretches W retraction AROM x 10      Manual Therapy   Soft tissue mobilization to the pectoralis, and lateral expander border and in SL to lats,lateral border and interscapular area.    Myofascial Release Rt axilla    Passive ROM to the Rt shoulder into flexion, abduction, ER   vcs for relaxation                       PT Long Term Goals - 06/24/20 1647      PT LONG TERM GOAL #1   Title Pt will be independent with HEP to increase right shoulder ROM and strength    Time 6    Period Weeks    Status Achieved      PT LONG TERM GOAL #2   Title Pt will have right shoulder flexion and abduction within 10 degrees of baseline    Baseline abd 175, flex 165    Time 6    Period Weeks    Status New      PT  LONG TERM GOAL #3   Title Pts quick dash will be no greater than 10%    Baseline 36%    Time 6    Period Weeks    Status New      PT LONG TERM GOAL #4   Title Pt will be fit for compression garments prn (sleeve,bra)    Time 6    Period Weeks    Status New      PT LONG TERM GOAL #5   Title Pt will have decreased pain/tightness by greater than 50%    Baseline now 35%    Period Weeks    Status New                 Plan - 08/17/20 1721    Clinical Impression Statement Pt was able to progress strengthening to 2 sets of 10.  Pt with less expander pain today but still with Rt latissimus region pain with movement and palpation.  Pt has less firmness lateral edge of expander today.    PT Frequency 2x / week    PT Duration 6 weeks    PT Treatment/Interventions ADLs/Self Care Home Management;Therapeutic exercise;Manual techniques;Patient/family education;Manual lymph drainage;Scar mobilization;Passive range of motion    Consulted and Agree with Plan of Care Patient           Patient will benefit from skilled therapeutic intervention in order to improve the following deficits and impairments:     Visit Diagnosis: Malignant neoplasm of overlapping sites of right breast (HCC)  Stiffness of right shoulder, not elsewhere classified  Abnormal posture  Aftercare following surgery for neoplasm  Malignant neoplasm of upper-outer quadrant of right breast in female, estrogen receptor positive ([place])     Problem List Patient Active Problem List   Diagnosis Date Noted  . Genetic testing 03/08/2020  . Malignant neoplasm of overlapping sites of right breast ([place]) 02/25/2020  . Family history of breast cancer     Stark Bray 08/17/2020, 5:23 PM  [place], [place], [address] Phone: [phone]   Fax:  [phone]  Name: Gail Erickson MRN: [id number] Date of Birth: Aug 22, 1975

## 2020-08-24 ENCOUNTER — Ambulatory Visit: Payer: BC Managed Care – PPO

## 2020-08-24 ENCOUNTER — Other Ambulatory Visit: Payer: Self-pay

## 2020-08-24 DIAGNOSIS — R293 Abnormal posture: Secondary | ICD-10-CM | POA: Diagnosis not present

## 2020-08-24 DIAGNOSIS — Z483 Aftercare following surgery for neoplasm: Secondary | ICD-10-CM

## 2020-08-24 DIAGNOSIS — M25611 Stiffness of right shoulder, not elsewhere classified: Secondary | ICD-10-CM

## 2020-08-24 DIAGNOSIS — M25511 Pain in right shoulder: Secondary | ICD-10-CM | POA: Diagnosis not present

## 2020-08-24 DIAGNOSIS — C50811 Malignant neoplasm of overlapping sites of right female breast: Secondary | ICD-10-CM | POA: Diagnosis not present

## 2020-08-24 DIAGNOSIS — Z170 Estrogen receptor positive status [ER+]: Secondary | ICD-10-CM | POA: Diagnosis not present

## 2020-08-24 DIAGNOSIS — C50411 Malignant neoplasm of upper-outer quadrant of right female breast: Secondary | ICD-10-CM | POA: Diagnosis not present

## 2020-08-24 NOTE — Therapy (Signed)
[place], [place], [address] Phone: [phone]   Fax:  [phone]  Physical Therapy Treatment  Patient Details  Name: Gail Erickson MRN: [id number] Date of Birth: 09-Aug-1975 Referring Provider (PT): Dr. Marlou Starks   Encounter Date: 08/24/2020   PT End of Session - 08/24/20 1002     Visit Number 14    Number of Visits 16    Date for PT Re-Evaluation 09/16/20    PT Start Time 0904   but got called in with another pt for 5 min   PT Stop Time 1000    PT Time Calculation (min) 56 min    Activity Tolerance Patient tolerated treatment well    Behavior During Therapy [facility] for tasks assessed/performed             Past Medical History:  Diagnosis Date   Abnormal Pap smear of cervix 06-02-14   LGSIL:neg HR HPV (1st abn. pap)   Cyst of right breast    --stable in 2015 U/S done in [place], [place]   Elevated DHEA ([place]) 05/21/15   level = 343   Family history of breast cancer    HSV-1 (herpes simplex virus 1) infection    oral HSV   Migraine without aura    Pre-diabetes    Prediabetes 2021   sees Dr.Balan   Vitamin D deficiency 05/21/15   level = 10    Past Surgical History:  Procedure Laterality Date   BREAST RECONSTRUCTION WITH PLACEMENT OF TISSUE EXPANDER AND FLEX HD (ACELLULAR HYDRATED DERMIS) Right 04/28/2020   Procedure: IMMEDIATE RIGHT BREAST RECONSTRUCTION WITH PLACEMENT OF TISSUE EXPANDER AND FLEX HD (ACELLULAR HYDRATED DERMIS);  Surgeon: Wallace Going, DO;  Location: [facility];  Service: Plastics;  Laterality: Right;   COLPOSCOPY     MASTECTOMY W/ SENTINEL NODE BIOPSY Right 04/28/2020   Procedure: RIGHT MASTECTOMY WITH SENTINEL LYMPH NODE BIOPSY;  Surgeon: [name], MD;  Location: [facility];  Service: General;  Laterality: Right;  PECTORAL BLOCK   UMBILICAL HERNIA REPAIR     --as a child    There were no vitals filed for this visit.   Subjective  Assessment - 08/24/20 0903     Subjective Doing well overall.  Still have some soreness with the lat muscle and continue to have some soreness around expander on right.  Hard to sleep on that side. No more fills to be done.  Surgery is scheduled for a month from today on July 14. Had some discomfort at the lateral breast.    Pertinent History December 6th learned of Cancer from a biopsy done on Dec 3.  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 Did gene testing today, and having a biopsy on left breast tomorrow. Presently planning for right breast mastectomy but awaiting gene testing results. Pt had right mastectomy with SLNB and tissue expanders on 04/28/2020. I am able to sleep on my right arm longer now, but sometimes my arm feels heavy/tingling when I wake up but it resolves quickly. Started back to the exercises last week.    Patient Stated Goals Reassess right UE post surgery.                               [facility] Adult PT Treatment/Exercise - 08/24/20 0001       Shoulder Exercises: Standing   Flexion Strengthening;Both;10 reps   and scaption   Shoulder Flexion Weight (  lbs) 3#    Flexion Limitations some increased discomfortlateral expander    Retraction Both;20 reps    Theraband Level (Shoulder Retraction) Level 2 (Red)    Other Standing Exercises elbow flex 3# 2x10    Other Standing Exercises triceps kick back 3 # 2 x 10                         PT Long Term Goals - 08/24/20 0909       PT LONG TERM GOAL #1   Title Pt will be independent with HEP to increase right shoulder ROM and strength    Time 6    Period Weeks    Status Achieved      PT LONG TERM GOAL #2   Title Pt will have right shoulder flexion and abduction within 10 degrees of baseline    Time 6    Period Weeks    Status New      PT LONG TERM GOAL #3   Title Pts quick dash will be no greater than 10%    Baseline 36%    Time 6    Period Weeks      PT LONG TERM GOAL #4   Title Pt will be fit for  compression garments prn (sleeve,bra)    Baseline goig to get fit for bra today.has script for sleeve    Time 6    Period Weeks    Status New      PT LONG TERM GOAL #5   Title Pt will have decreased pain/tightness by greater than 50%    Time 6    Period Weeks    Status Achieved                   Plan - 08/24/20 1003     Clinical Impression Statement pt continues with tenderness at right lats and around expander and mild tenderness at axillary border of pecs.  She was given a script for a prophylactic compression sleeve, and is being fit for a bra today.  We will continue 1x per week for 1-2 weeks before her surgery to replace expanders    Personal Factors and Comorbidities Comorbidity 1    Comorbidities Right breast CA with expanders    Examination-Activity Limitations Reach Overhead;Sleep;Dressing    Examination-Participation Restrictions Driving    Stability/Clinical Decision Making Stable/Uncomplicated    Rehab Potential Excellent    PT Frequency 2x / week    PT Duration 6 weeks    PT Treatment/Interventions ADLs/Self Care Home Management;Therapeutic exercise;Manual techniques;Patient/family education;Manual lymph drainage;Scar mobilization;Passive range of motion    PT Next Visit Plan Cont STM pecs, lats, PROM, strength    PT Home Exercise Plan 4 post op exercises, supine wand flex/scaption, corner stretch, standing band(SR, shoulder extension, bilateral ER)    Recommended Other Services pt has script for bra and sleeve    Consulted and Agree with Plan of Care Patient             Patient will benefit from skilled therapeutic intervention in order to improve the following deficits and impairments:  Decreased activity tolerance, Decreased knowledge of precautions, Decreased range of motion, Decreased strength, Decreased scar mobility, Increased edema, Impaired sensation, Postural dysfunction, Impaired UE functional use, Pain  Visit Diagnosis: Malignant neoplasm of  overlapping sites of right breast (HCC)  Stiffness of right shoulder, not elsewhere classified  Abnormal posture  Aftercare following surgery for neoplasm  Malignant neoplasm of upper-outer  quadrant of right breast in female, estrogen receptor positive ([place])  Acute pain of right shoulder  Estrogen receptor positive     Problem List Patient Active Problem List   Diagnosis Date Noted   Genetic testing 03/08/2020   Malignant neoplasm of overlapping sites of right breast [facility]) 02/25/2020   Family history of breast cancer     Claris Pong 08/24/2020, 10:07 AM  [place], [place], [address] Phone: [phone]   Fax:  [phone]  Name: Gail Erickson MRN: [id number] Date of Birth: 1976-03-01  Cheral Almas, PT 08/24/20 10:08 AM

## 2020-08-25 DIAGNOSIS — F4320 Adjustment disorder, unspecified: Secondary | ICD-10-CM | POA: Diagnosis not present

## 2020-09-01 ENCOUNTER — Other Ambulatory Visit: Payer: Self-pay

## 2020-09-01 ENCOUNTER — Ambulatory Visit: Payer: BC Managed Care – PPO

## 2020-09-01 DIAGNOSIS — Z170 Estrogen receptor positive status [ER+]: Secondary | ICD-10-CM

## 2020-09-01 DIAGNOSIS — M25511 Pain in right shoulder: Secondary | ICD-10-CM | POA: Diagnosis not present

## 2020-09-01 DIAGNOSIS — Z483 Aftercare following surgery for neoplasm: Secondary | ICD-10-CM

## 2020-09-01 DIAGNOSIS — R293 Abnormal posture: Secondary | ICD-10-CM

## 2020-09-01 DIAGNOSIS — M25611 Stiffness of right shoulder, not elsewhere classified: Secondary | ICD-10-CM | POA: Diagnosis not present

## 2020-09-01 DIAGNOSIS — C50811 Malignant neoplasm of overlapping sites of right female breast: Secondary | ICD-10-CM | POA: Diagnosis not present

## 2020-09-01 DIAGNOSIS — C50411 Malignant neoplasm of upper-outer quadrant of right female breast: Secondary | ICD-10-CM | POA: Diagnosis not present

## 2020-09-01 NOTE — Therapy (Signed)
[place], [place], [address] Phone: [phone]   Fax:  [phone]  Physical Therapy Treatment  Patient Details  Name: Gail Erickson MRN: [id number] Date of Birth: 1975-10-12 Referring Provider (PT): Dr. Marlou Starks   Encounter Date: 09/01/2020   PT End of Session - 09/01/20 1731     Visit Number 15    Number of Visits 16    Date for PT Re-Evaluation 09/16/20    PT Start Time 1509    PT Stop Time 1553    PT Time Calculation (min) 44 min    Activity Tolerance Patient tolerated treatment well    Behavior During Therapy [facility] for tasks assessed/performed             Past Medical History:  Diagnosis Date   Abnormal Pap smear of cervix 06-02-14   LGSIL:neg HR HPV (1st abn. 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   Migraine without aura    Pre-diabetes    Prediabetes 2021   sees Dr.Balan   Vitamin D deficiency 05/21/15   level = 10    Past Surgical History:  Procedure Laterality Date   BREAST RECONSTRUCTION WITH PLACEMENT OF TISSUE EXPANDER AND FLEX HD (ACELLULAR HYDRATED DERMIS) Right 04/28/2020   Procedure: IMMEDIATE RIGHT BREAST RECONSTRUCTION WITH PLACEMENT OF TISSUE EXPANDER AND FLEX HD (ACELLULAR HYDRATED DERMIS);  Surgeon: Wallace Going, DO;  Location: [place];  Service: Plastics;  Laterality: Right;   COLPOSCOPY     MASTECTOMY W/ SENTINEL NODE BIOPSY Right 04/28/2020   Procedure: RIGHT MASTECTOMY WITH SENTINEL LYMPH NODE BIOPSY;  Surgeon: [name], MD;  Location: [place];  Service: General;  Laterality: Right;  PECTORAL BLOCK   UMBILICAL HERNIA REPAIR     --as a child    There were no vitals filed for this visit.   Subjective Assessment - 09/01/20 1509     Subjective I got an  appt for July 7th to get my bra. Lats are still tender and this weekend I experience alot of pain at lateral trunk and breast. I actually took ibuprofen and tylenol.  I have not tried getting the sleeve yet.    Pertinent History December 6th learned of Cancer from a biopsy done on Dec 3.  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 Did gene testing today, and having a biopsy on left breast tomorrow. Presently planning for right breast mastectomy but awaiting gene testing results. Pt had right mastectomy with SLNB and tissue expanders on 04/28/2020.    Patient Stated Goals Reassess right UE post surgery.                               [facility] Adult PT Treatment/Exercise - 09/01/20 0001       Shoulder Exercises: Standing   Flexion Strengthening;Both;20 reps   and scaption   Shoulder Flexion Weight (lbs) 2#    Other Standing Exercises elbow flex 3# 2x10    Other Standing Exercises triceps kick back 3 # 2 x 10      Shoulder Exercises: ROM/Strengthening   Other ROM/Strengthening Exercises lower trunk rotation with arms in ER x 4 ea  Manual Therapy   Soft tissue mobilization to the pectoralis, and lateral expander border and in SL to lats,lateral border and interscapular area.    Passive ROM to the Rt shoulder into flexion, scaption, abduction, ER                        PT Long Term Goals - 08/24/20 0909       PT LONG TERM GOAL #1   Title Pt will be independent with HEP to increase right shoulder ROM and strength    Time 6    Period Weeks    Status Achieved      PT LONG TERM GOAL #2   Title Pt will have right shoulder flexion and abduction within 10 degrees of baseline    Time 6    Period Weeks    Status New      PT LONG TERM GOAL #3   Title Pts quick dash will be no greater than 10%    Baseline 36%    Time 6    Period Weeks      PT LONG TERM GOAL #4   Title Pt will be fit for compression garments prn (sleeve,bra)    Baseline goig to get fit for bra today.has  script for sleeve    Time 6    Period Weeks    Status New      PT LONG TERM GOAL #5   Title Pt will have decreased pain/tightness by greater than 50%    Time 6    Period Weeks    Status Achieved                   Plan - 09/01/20 1732     Clinical Impression Statement Pts. main complaint continues to be pain at lateral breast and lats as well as tightness in right pectorals.  She responds well to soft tissue mobilization and had no complaints of tightness or pain after treatment.  She was not able to get an appt for bra until July 7 and has not been fit for her sleeve yet. We will place her onhold after next visit as she will have her expander exchange in July.    Personal Factors and Comorbidities Comorbidity 1    Comorbidities Right breast CA with expanders    Examination-Activity Limitations Reach Overhead;Sleep;Dressing    Stability/Clinical Decision Making Stable/Uncomplicated    Rehab Potential Excellent    PT Frequency 2x / week    PT Duration 6 weeks    PT Treatment/Interventions ADLs/Self Care Home Management;Therapeutic exercise;Manual techniques;Patient/family education;Manual lymph drainage;Scar mobilization;Passive range of motion    PT Next Visit Plan recert,Cont STM pecs, lats, PROM, strength    PT Home Exercise Plan 4 post op exercises, supine wand flex/scaption, corner stretch, standing band(SR, shoulder extension, bilateral ER)    Consulted and Agree with Plan of Care Patient             Patient will benefit from skilled therapeutic intervention in order to improve the following deficits and impairments:  Decreased activity tolerance, Decreased knowledge of precautions, Decreased range of motion, Decreased strength, Decreased scar mobility, Increased edema, Impaired sensation, Postural dysfunction, Impaired UE functional use, Pain  Visit Diagnosis: Malignant neoplasm of overlapping sites of right breast (HCC)  Stiffness of right shoulder, not elsewhere  classified  Abnormal posture  Aftercare following surgery for neoplasm  Malignant neoplasm of upper-outer quadrant of right breast in female, estrogen receptor positive ([place])  Acute pain of right shoulder  Estrogen receptor positive     Problem List Patient Active Problem List   Diagnosis Date Noted   Genetic testing 03/08/2020   Malignant neoplasm of overlapping sites of right breast [facility]) 02/25/2020   Family history of breast cancer     Gail Erickson 09/01/2020, 5:38 PM  [place], [place], [address] Phone: [phone]   Fax:  [phone]  Name: Gail Erickson MRN: [id number] Date of Birth: 1975/09/06  Cheral Almas, PT 09/01/20 5:40 PM

## 2020-09-02 ENCOUNTER — Other Ambulatory Visit: Payer: Self-pay | Admitting: Hematology

## 2020-09-02 DIAGNOSIS — C50811 Malignant neoplasm of overlapping sites of right female breast: Secondary | ICD-10-CM

## 2020-09-08 ENCOUNTER — Other Ambulatory Visit: Payer: Self-pay

## 2020-09-08 ENCOUNTER — Ambulatory Visit: Payer: BC Managed Care – PPO

## 2020-09-08 DIAGNOSIS — M25611 Stiffness of right shoulder, not elsewhere classified: Secondary | ICD-10-CM | POA: Diagnosis not present

## 2020-09-08 DIAGNOSIS — C50811 Malignant neoplasm of overlapping sites of right female breast: Secondary | ICD-10-CM | POA: Diagnosis not present

## 2020-09-08 DIAGNOSIS — Z170 Estrogen receptor positive status [ER+]: Secondary | ICD-10-CM | POA: Diagnosis not present

## 2020-09-08 DIAGNOSIS — Z483 Aftercare following surgery for neoplasm: Secondary | ICD-10-CM

## 2020-09-08 DIAGNOSIS — R293 Abnormal posture: Secondary | ICD-10-CM

## 2020-09-08 DIAGNOSIS — C50411 Malignant neoplasm of upper-outer quadrant of right female breast: Secondary | ICD-10-CM | POA: Diagnosis not present

## 2020-09-08 DIAGNOSIS — M25511 Pain in right shoulder: Secondary | ICD-10-CM

## 2020-09-08 NOTE — Therapy (Signed)
[place], [place], [address] Phone: [phone]   Fax:  [phone]  Physical Therapy Treatment  Patient Details  Name: Gail Erickson MRN: [id number] Date of Birth: [birth year] Referring Provider (PT): Dr. Marlou Starks   Encounter Date: 09/08/2020   PT End of Session - 09/08/20 1703     Visit Number 16    Number of Visits 28    Date for PT Re-Evaluation 10/20/20    PT Start Time 1608    PT Stop Time 1655    PT Time Calculation (min) 47 min    Activity Tolerance Patient tolerated treatment well    Behavior During Therapy [facility] for tasks assessed/performed             Past Medical History:  Diagnosis Date   Abnormal Pap smear of cervix 06-02-14   LGSIL:neg HR HPV (1st abn. 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   Migraine without aura    Pre-diabetes    Prediabetes 2021   sees Dr.Balan   Vitamin D deficiency 05/21/15   level = 10    Past Surgical History:  Procedure Laterality Date   BREAST RECONSTRUCTION WITH PLACEMENT OF TISSUE EXPANDER AND FLEX HD (ACELLULAR HYDRATED DERMIS) Right 04/28/2020   Procedure: IMMEDIATE RIGHT BREAST RECONSTRUCTION WITH PLACEMENT OF TISSUE EXPANDER AND FLEX HD (ACELLULAR HYDRATED DERMIS);  Surgeon: Wallace Going, DO;  Location: [place];  Service: Plastics;  Laterality: Right;   COLPOSCOPY     MASTECTOMY W/ SENTINEL NODE BIOPSY Right 04/28/2020   Procedure: RIGHT MASTECTOMY WITH SENTINEL LYMPH NODE BIOPSY;  Surgeon: [name], MD;  Location: [place];  Service: General;  Laterality: Right;  PECTORAL BLOCK   UMBILICAL HERNIA REPAIR     --as a child    There were no vitals filed for this visit.       [facility] PT Assessment - 09/08/20 0001       Assessment    Medical Diagnosis S/p Right mastectomy    Referring Provider (PT) Dr. Marlou Starks    Onset Date/Surgical Date 04/28/20    Hand Dominance Right      Prior Function   Level of Independence Independent      AROM   Right Shoulder Extension 62 Degrees    Right Shoulder Flexion 161 Degrees    Right Shoulder ABduction 163 Degrees    Right Shoulder External Rotation 90 Degrees                   Quick Dash - 09/08/20 0001     Open a tight or new jar No difficulty    Do heavy household chores (wash walls, wash floors) Mild difficulty    Carry a shopping bag or briefcase No difficulty    Wash your back No difficulty    Use a knife to cut food Mild difficulty    Recreational activities in which you take some force or impact through your arm, shoulder, or hand (golf, hammering, tennis) No difficulty    During the past week, to what extent has your arm, shoulder or hand problem interfered with your normal social activities with family, friends, neighbors, or groups? Not at all    During  the past week, to what extent has your arm, shoulder or hand problem limited your work or other regular daily activities Not at all    Arm, shoulder, or hand pain. Moderate    Tingling (pins and needles) in your arm, shoulder, or hand Moderate    Difficulty Sleeping Mild difficulty    DASH Score 15.91 %                    [facility] Adult PT Treatment/Exercise - 09/08/20 0001       Shoulder Exercises: Pulleys   Flexion 2 minutes    Scaption 1 minute    ABduction 1 minute      Manual Therapy   Soft tissue mobilization to the pectoralis, and lateral expander border and in SL to lats,lateral border and interscapular area.    Passive ROM to the Rt shoulder into flexion, abduction, ER   vcs for relaxation                         PT Long Term Goals - 09/08/20 1616       PT LONG TERM GOAL #1   Title Pt will be independent with HEP to increase right shoulder ROM and strength     Time 6    Period Weeks    Status Achieved      PT LONG TERM GOAL #2   Title Pt will have right shoulder flexion and abduction within 10 degrees of baseline    Baseline abd 175, flex 165   ,163 abd,  165 flex 09/08/20    Time 6    Period Weeks    Status On-going    Target Date 10/20/20      PT LONG TERM GOAL #3   Title Pts quick dash will be no greater than 10%    Baseline 36%, 15% today    Time 6    Period Weeks    Status On-going    Target Date 10/20/20      PT LONG TERM GOAL #4   Title Pt will be fit for compression garments prn (sleeve,bra)    Baseline bra appt next week    Time 6    Period Weeks    Status On-going    Target Date 10/20/20      PT LONG TERM GOAL #5   Title Pt will have decreased pain/tightness by greater than 50%    Time 6    Period Weeks    Status Achieved                   Plan - 09/08/20 1707     Clinical Impression Statement Pts. main complaint continues to be pain at the lateral breast/lat region, and tightness in the right pectorals.  She continues to respond well to soft tissue mobilization.  Shoulder ROM has improved but is still limited by tightness/pain at the border of her expander which should improve after her expander exchange in 2 weeks. Her quick dash has improved significantly but is not yet at goal set.  She is on hold until several weeks after her surgery and she will be reassessed in first visit upon her return    Personal Factors and Comorbidities Comorbidity 1    Comorbidities Right breast CA with expanders    Examination-Activity Limitations Reach Overhead;Sleep;Dressing    Examination-Participation Restrictions Driving    Stability/Clinical Decision Making Stable/Uncomplicated    Rehab Potential Excellent  PT Frequency 2x / week    PT Duration 6 weeks    PT Treatment/Interventions ADLs/Self Care Home Management;Therapeutic exercise;Manual techniques;Patient/family education;Manual lymph drainage;Scar  mobilization;Passive range of motion    PT Next Visit Plan reassess after expander exchange    PT Home Exercise Plan 4 post op exercises, supine wand flex/scaption, corner stretch, standing band(SR, shoulder extension, bilateral ER)    Consulted and Agree with Plan of Care Patient             Patient will benefit from skilled therapeutic intervention in order to improve the following deficits and impairments:  Decreased activity tolerance, Decreased knowledge of precautions, Decreased range of motion, Decreased strength, Decreased scar mobility, Increased edema, Impaired sensation, Postural dysfunction, Impaired UE functional use, Pain  Visit Diagnosis: Malignant neoplasm of overlapping sites of right breast (HCC)  Stiffness of right shoulder, not elsewhere classified  Abnormal posture  Aftercare following surgery for neoplasm  Malignant neoplasm of upper-outer quadrant of right breast in female, estrogen receptor positive ([place])  Acute pain of right shoulder  Estrogen receptor positive     Problem List Patient Active Problem List   Diagnosis Date Noted   Genetic testing 03/08/2020   Malignant neoplasm of overlapping sites of right breast ([place]) 02/25/2020   Family history of breast cancer     Claris Pong 09/08/2020, 5:15 PM  [place], [place], [address] Phone: [phone]   Fax:  [phone]  Name: Gail Erickson MRN: [id number] Date of Birth: 1975/03/28  Cheral Almas, PT 09/08/20 5:16 PM

## 2020-09-09 ENCOUNTER — Ambulatory Visit (INDEPENDENT_AMBULATORY_CARE_PROVIDER_SITE_OTHER): Payer: BC Managed Care – PPO | Admitting: Nurse Practitioner

## 2020-09-09 ENCOUNTER — Encounter: Payer: Self-pay | Admitting: Nurse Practitioner

## 2020-09-09 VITALS — BP 128/78 | HR 84 | Temp 98.3°F | Ht 69.0 in | Wt 231.0 lb

## 2020-09-09 DIAGNOSIS — C50811 Malignant neoplasm of overlapping sites of right female breast: Secondary | ICD-10-CM

## 2020-09-09 DIAGNOSIS — Z0000 Encounter for general adult medical examination without abnormal findings: Secondary | ICD-10-CM

## 2020-09-09 DIAGNOSIS — E7800 Pure hypercholesterolemia, unspecified: Secondary | ICD-10-CM | POA: Insufficient documentation

## 2020-09-09 DIAGNOSIS — Z7689 Persons encountering health services in other specified circumstances: Secondary | ICD-10-CM | POA: Diagnosis not present

## 2020-09-09 DIAGNOSIS — E785 Hyperlipidemia, unspecified: Secondary | ICD-10-CM

## 2020-09-09 DIAGNOSIS — Z0001 Encounter for general adult medical examination with abnormal findings: Secondary | ICD-10-CM | POA: Diagnosis not present

## 2020-09-09 DIAGNOSIS — E559 Vitamin D deficiency, unspecified: Secondary | ICD-10-CM | POA: Insufficient documentation

## 2020-09-09 DIAGNOSIS — Z139 Encounter for screening, unspecified: Secondary | ICD-10-CM | POA: Insufficient documentation

## 2020-09-09 NOTE — Assessment & Plan Note (Signed)
-  managed by GYN

## 2020-09-09 NOTE — Assessment & Plan Note (Addendum)
-  she is requesting colonoscopy; has remote family hx of colon CA -getting breast cancer treatment, and she would like to make sure she is colon cancer-free  -referral to GI

## 2020-09-09 NOTE — Assessment & Plan Note (Signed)
-  obtain records 

## 2020-09-09 NOTE — Assessment & Plan Note (Signed)
-  has reconstruction surgery scheduled for 7/14 -may need surgical clearance visit, if so she will call to set up appointment

## 2020-09-09 NOTE — Assessment & Plan Note (Addendum)
Lab Results  Component Value Date   CHOL 193 07/26/2020   HDL 50 07/26/2020   LDLCALC 125 (H) 07/26/2020   TRIG 82 07/26/2020   CHOLHDL 3.9 07/26/2020   -will check lipids with next draw

## 2020-09-09 NOTE — Progress Notes (Signed)
New Patient Office Visit  Subjective:  Patient ID: Gail Erickson, female    DOB: 1975/07/13  Age: 45 y.o. MRN: [id number]  CC:  Chief Complaint  Patient presents with   New Patient (Initial Visit)    Here to establish care, does have a headache today.    HPI Gail Erickson presents for new patient visit. No PCP in 6-7 years. Last physical was a while ago. She had GYN exam in May. No recent PCP labs.  She sees oncology and endocrinology.  She is followed by an external endocrinologist. She states she was having high "hormone levels", and she is being followed for prediabetes.  She recently started taking tamoxifen for breast cancer.    Past Medical History:  Diagnosis Date   Abnormal Pap smear of cervix 06-02-14   LGSIL:neg HR HPV (1st abn. 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   Migraine without aura    Pre-diabetes    Prediabetes 2021   sees Dr.Balan   Vitamin D deficiency 05/21/15   level = 10    Past Surgical History:  Procedure Laterality Date   BREAST RECONSTRUCTION WITH PLACEMENT OF TISSUE EXPANDER AND FLEX HD (ACELLULAR HYDRATED DERMIS) Right 04/28/2020   Procedure: IMMEDIATE RIGHT BREAST RECONSTRUCTION WITH PLACEMENT OF TISSUE EXPANDER AND FLEX HD (ACELLULAR HYDRATED DERMIS);  Surgeon: Wallace Going, DO;  Location: [place];  Service: Plastics;  Laterality: Right;   COLPOSCOPY     MASTECTOMY W/ SENTINEL NODE BIOPSY Right 04/28/2020   Procedure: RIGHT MASTECTOMY WITH SENTINEL LYMPH NODE BIOPSY;  Surgeon: [name], MD;  Location: [place];  Service: General;  Laterality: Right;  PECTORAL BLOCK   UMBILICAL HERNIA REPAIR     --as a child    Family History  Problem Relation Age of Onset   Breast cancer Mother 65   Diabetes Mother    Hypertension Mother     Hyperlipidemia Mother    Thyroid disease Mother    Transient ischemic attack Mother    Breast cancer Paternal Aunt 60       bil. Breast ca--deceased CHF   Breast cancer Cousin 76       maternal first cousin died Breast ca age 88   Hyperlipidemia Father    HIV/AIDS Maternal Uncle    Cancer Paternal Uncle        NOS - needed BMT   Cancer Maternal Grandmother 37       adrenal gland cancer   Breast cancer Paternal Grandmother        dx in her mid to late 77s   Aneurysm Maternal Aunt 26       Brain   Kidney disease Maternal Uncle    Cancer Other        2 maternal great uncles with cancer NOS   Breast cancer Maternal Aunt 72       Breast cancer   Breast cancer Maternal Aunt 72    Social History   Socioeconomic History   Marital status: Single    Spouse name: Not on file   Number of children: 0   Years of education: Not on file   Highest education level: Not on file  Occupational History   Not on  file  Tobacco Use   Smoking status: Never   Smokeless tobacco: Never  Vaping Use   Vaping Use: Never used  Substance and Sexual Activity   Alcohol use: Yes    Comment: 1 drink per month   Drug use: Never   Sexual activity: Yes    Partners: Male    Birth control/protection: Condom    Comment: condoms everytime, not sexually active since 4/22  Other Topics Concern   Not on file  Social History Narrative   -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 [employer]   Social Determinants of Health   Financial Resource Strain: Not on file  Food Insecurity: Not on file  Transportation Needs: Not on file  Physical Activity: Not on file  Stress: Not on file  Social Connections: Not on file  Intimate Partner Violence: Not on file    ROS Review of Systems  Objective:   Today's Vitals: BP 128/78 (BP Location: Right Arm, Patient Position: Sitting, Cuff Size: Large)   Pulse 84   Temp 98.3 F (36.8 C) (Temporal)   Ht _0  (1.753 m)   Wt 231 lb (104.8 kg)    LMP 09/02/2020 (Exact Date)   SpO2 98%   BMI 34.11 kg/m   Physical Exam  Assessment & Plan:   Problem List Items Addressed This Visit       Other   Malignant neoplasm of overlapping sites of right breast [facility])    -has reconstruction surgery scheduled for 7/14 -may need surgical clearance visit, if so she will call to set up appointment       Encounter to establish care - Primary    -obtain records       Screening due    -she is requesting colonoscopy; has remote family hx of colon CA -getting breast cancer treatment, and she would like to make sure she is colon cancer-free  -referral to GI       Relevant Orders   Ambulatory referral to Gastroenterology   HLD (hyperlipidemia)    Lab Results  Component Value Date   CHOL 193 07/26/2020   HDL 50 07/26/2020   LDLCALC 125 (H) 07/26/2020   TRIG 82 07/26/2020   CHOLHDL 3.9 07/26/2020  -will check lipids with next draw       Vitamin D deficiency    -managed by GYN       Other Visit Diagnoses     Routine general medical examination at a health care facility       Relevant Orders   CBC with Differential/Platelet   Lipid Panel With LDL/HDL Ratio   CMP14+EGFR       Outpatient Encounter Medications as of 09/09/2020  Medication Sig   Ascorbic Acid (VITAMIN C) 1000 MG tablet Take 1,000 mg by mouth daily.   Aspirin-Acetaminophen-Caffeine (EXCEDRIN MIGRAINE PO) Take 1 tablet by mouth as needed.   diazepam (VALIUM) 2 MG tablet Take 1 tablet (2 mg total) by mouth every 12 (twelve) hours as needed for muscle spasms.   ibuprofen (ADVIL) 600 MG tablet Take 1 tablet (600 mg total) by mouth every 6 (six) hours as needed for mild pain or moderate pain. For use AFTER surgery   Multiple Vitamin (MULTIVITAMIN) tablet Take 1 tablet by mouth daily.   Omega-3 Fatty Acids (FISH OIL) 1000 MG CAPS Take by mouth.   Probiotic Product (PROBIOTIC-10 PO) Take 1 tablet by mouth.   tamoxifen (NOLVADEX) 20 MG tablet TAKE 1 TABLET(20 MG)  BY MOUTH DAILY  VITAMIN D PO Take 1,000 Int'l Units by mouth.   [DISCONTINUED] pseudoephedrine (SUDAFED) 30 MG tablet Take 30 mg by mouth every 4 (four) hours as needed for congestion. (Patient not taking: Reported on 09/09/2020)   No facility-administered encounter medications on file as of 09/09/2020.    Follow-up: Return in about 6 months (around 03/11/2021) for Physical Exam.   Noreene Larsson, NP

## 2020-09-09 NOTE — Patient Instructions (Signed)
Please have fasting labs drawn 2-3 days prior to your appointment so we can discuss the results during your office visit.  

## 2020-09-10 ENCOUNTER — Encounter: Payer: Self-pay | Admitting: Surgical

## 2020-09-10 ENCOUNTER — Other Ambulatory Visit: Payer: Self-pay

## 2020-09-10 ENCOUNTER — Ambulatory Visit (INDEPENDENT_AMBULATORY_CARE_PROVIDER_SITE_OTHER): Payer: BC Managed Care – PPO | Admitting: Surgical

## 2020-09-10 VITALS — BP 125/85 | HR 75 | Ht 69.0 in | Wt 227.0 lb

## 2020-09-10 DIAGNOSIS — C50811 Malignant neoplasm of overlapping sites of right female breast: Secondary | ICD-10-CM

## 2020-09-10 MED ORDER — CEPHALEXIN 500 MG PO CAPS: 500.0000 mg | ORAL_CAPSULE | Freq: Four times a day (QID) | ORAL | 0 refills | Status: AC

## 2020-09-10 MED ORDER — IBUPROFEN 600 MG PO TABS: 600.0000 mg | ORAL_TABLET | Freq: Three times a day (TID) | ORAL | 0 refills | Status: DC | PRN

## 2020-09-10 MED ORDER — HYDROCODONE-ACETAMINOPHEN 5-325 MG PO TABS: 1.0000 | ORAL_TABLET | Freq: Four times a day (QID) | ORAL | 0 refills | Status: AC | PRN

## 2020-09-10 MED ORDER — ONDANSETRON HCL 4 MG PO TABS: 4.0000 mg | ORAL_TABLET | Freq: Three times a day (TID) | ORAL | 0 refills | Status: DC | PRN

## 2020-09-10 NOTE — H&P (View-Only) (Signed)
Patient ID: Gail Erickson, female    DOB: Aug 04, 1975, 45 y.o.   MRN: [id number]  Chief Complaint  Patient presents with   Pre-op Exam      ICD-10-CM   1. Malignant neoplasm of overlapping sites of right breast [facility])  C50.811       History of Present Illness: Gail Erickson is a 45 y.o.  female  with a history of right breast cancer, DCIS.  She presents for preoperative evaluation for upcoming procedure, removal of right breast tissue expander and placement of right breast implant and left breast mastopexy, scheduled for 09/23/2020 with Dr. Marla Roe.  The patient has not had problems with anesthesia. No history of DVT/PE.  No family history of DVT/PE.  No family or personal history of bleeding or clotting disorders.  Patient is not currently taking any blood thinners.  No history of CVA/MI.  Mom with history of phlebitis.  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  PMH Significant for: Migraines, prediabetes  Patient currently has 625 out of 535 cc in her right breast tissue expander. She has confirmed that she would like to use silicone implants.  Past Medical History: Allergies: No Known Allergies  Current Medications:  Current Outpatient Medications:    Ascorbic Acid (VITAMIN C) 1000 MG tablet, Take 1,000 mg by mouth daily., Disp: , Rfl:    Aspirin-Acetaminophen-Caffeine (EXCEDRIN MIGRAINE PO), Take 1 tablet by mouth as needed., Disp: , Rfl:    ibuprofen (ADVIL) 600 MG tablet, Take 1 tablet (600 mg total) by mouth every 6 (six) hours as needed for mild pain or moderate pain. For use AFTER surgery, Disp: 30 tablet, Rfl: 0   Multiple Vitamin (MULTIVITAMIN) tablet, Take 1 tablet by mouth daily., Disp: , Rfl:    Omega-3 Fatty Acids (FISH OIL) 1000 MG CAPS, Take by mouth., Disp: , Rfl:    Probiotic Product (PROBIOTIC-10 PO), Take 1 tablet by mouth., Disp: , Rfl:    VITAMIN D PO, Take 1,000 Int'l Units by mouth., Disp: , Rfl:    diazepam  (VALIUM) 2 MG tablet, Take 1 tablet (2 mg total) by mouth every 12 (twelve) hours as needed for muscle spasms. (Patient not taking: Reported on 09/10/2020), Disp: 20 tablet, Rfl: 0   tamoxifen (NOLVADEX) 20 MG tablet, TAKE 1 TABLET(20 MG) BY MOUTH DAILY (Patient not taking: Reported on 09/10/2020), Disp: 30 tablet, Rfl: 3  Past Medical Problems: Past Medical History:  Diagnosis Date   Abnormal Pap smear of cervix 06-02-14   LGSIL:neg HR HPV (1st abn. 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   Migraine without aura    Pre-diabetes    Prediabetes 2021   sees Dr.Balan   Vitamin D deficiency 05/21/15   level = 10    Past Surgical History: Past Surgical History:  Procedure Laterality Date   BREAST RECONSTRUCTION WITH PLACEMENT OF TISSUE EXPANDER AND FLEX HD (ACELLULAR HYDRATED DERMIS) Right 04/28/2020   Procedure: IMMEDIATE RIGHT BREAST RECONSTRUCTION WITH PLACEMENT OF TISSUE EXPANDER AND FLEX HD (ACELLULAR HYDRATED DERMIS);  Surgeon: Wallace Going, DO;  Location: [facility];  Service: Plastics;  Laterality: Right;   COLPOSCOPY     MASTECTOMY W/ SENTINEL NODE BIOPSY Right  04/28/2020   Procedure: RIGHT MASTECTOMY WITH SENTINEL LYMPH NODE BIOPSY;  Surgeon: [name], MD;  Location: [facility];  Service: General;  Laterality: Right;  PECTORAL BLOCK   UMBILICAL HERNIA REPAIR     --as a child    Social History: Social History   Socioeconomic History   Marital status: Single    Spouse name: Not on file   Number of children: 0   Years of education: Not on file   Highest education level: Not on file  Occupational History   Not on file  Tobacco Use   Smoking status: Never   Smokeless tobacco: Never  Vaping Use   Vaping Use: Never used  Substance and Sexual Activity   Alcohol use: Yes     Comment: 1 drink per month   Drug use: Never   Sexual activity: Yes    Partners: Male    Birth control/protection: Condom    Comment: condoms everytime, not sexually active since 4/22  Other Topics Concern   Not on file  Social History Narrative   -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 Breast ca--deceased CHF   Breast cancer Cousin 69       maternal first cousin died Breast ca age 32   Hyperlipidemia Father    HIV/AIDS Maternal Uncle    Cancer Paternal Uncle        NOS - needed BMT   Cancer Maternal Grandmother 87       adrenal gland cancer   Breast cancer Paternal Grandmother        dx in her mid to late 75s   Aneurysm Maternal Aunt 26       Brain   Kidney disease Maternal Uncle    Cancer Other        2 maternal great uncles with cancer NOS   Breast cancer Maternal Aunt 72       Breast cancer   Breast cancer Maternal Aunt 72    Review of Systems: Review of Systems  Constitutional: Negative.   Respiratory: Negative.    Cardiovascular: Negative.   Gastrointestinal: Negative.   Musculoskeletal:  Positive for myalgias.  Neurological: Negative.    Physical Exam: Vital Signs BP 125/85 (BP Location: Left Arm, Patient Position: Sitting, Cuff Size: Large)   Pulse 75   Ht 5\' 9"  (1.753 m)   Wt 227 lb (103 kg)   LMP 09/02/2020 (Exact Date)   SpO2 95%   BMI 33.52 kg/m   Physical Exam  Constitutional:       General: Not in acute distress.    Appearance: Normal appearance. Not ill-appearing.  HENT:     Head: Normocephalic and atraumatic.  Eyes:     Pupils: Pupils are equal, round Neck:     Musculoskeletal: Normal range of motion.  Cardiovascular:  Rate and Rhythm: Normal rate    Pulses: Normal pulses.  Pulmonary:     Effort: Pulmonary effort is normal. No respiratory distress.  Abdominal:     General: Abdomen is flat. There is no distension.  Musculoskeletal: Normal range of motion.  Skin:    General: Skin is warm and dry.     Findings: No erythema or rash.  Neurological:     General: No focal deficit present.     Mental Status: Alert and oriented to person, place, and time. Mental status is at baseline.     Motor: No weakness.  Psychiatric:        Mood and Affect: Mood normal.        Behavior: Behavior normal.    Assessment/Plan: The patient is scheduled for exchange of right breast tissue expander for silicone implant and left breast mastopexy with Dr. Marla Roe.  Risks, benefits, and alternatives of procedure discussed, questions answered and consent obtained.    Smoking Status: Non-smoker; Counseling Given?  N/A Last Mammogram: 01/14/2020; Results: Benign right breast cyst  Caprini Score: 6, high; Risk Factors include: Age, BMI rater than 25, history of right breast cancer and length of planned surgery. Recommendation for mechanical prophylaxis. Encourage early ambulation.   Pictures obtained: 08/06/2020  Post-op Rx sent to pharmacy: Norco, Zofran, Keflex, ibuprofen  Patient was provided with the General Surgical Risk consent document and Pain Medication Agreement prior to their appointment.  They had adequate time to read through the risk consent documents and Pain Medication Agreement. We also discussed them in person together during this preop appointment. All of their questions were answered to their satisfaction.  Recommended calling if they have any further  questions.  Risk consent form and Pain Medication Agreement to be scanned into patient's chart.  The risks that can be encountered with and after placement of a breast implant were discussed and include the following but not limited to these: bleeding, infection, delayed healing, anesthesia risks, skin sensation changes, injury to structures including nerves, blood vessels, and muscles which may be temporary or permanent, allergies to tape, suture materials and glues, blood products, topical preparations or injected agents, skin contour irregularities, skin discoloration and swelling, deep vein thrombosis, cardiac and pulmonary complications, pain, which may persist, fluid accumulation, wrinkling of the skin over the implanmt, changes in nipple or breast sensation, implant leakage or rupture, faulty position of the implant, persistent pain, formation of tight scar tissue around the implant (capsular contracture).  Patient was provided with the [facility]or implant patient decision checklist and this was completed during today's preoperative evaluation. Patient had time to read through the information and any questions were answered to their content. Form will be scanned into patient's chart.  Informed patient to avoid taking ibuprofen or Excedrin 1 week prior to surgery.  Hold any supplements 2 weeks prior to surgery.  Electronically signed by: Carola Rhine Travontae Freiberger, PA-C 09/10/2020 9:20 AM

## 2020-09-10 NOTE — Progress Notes (Signed)
Patient ID: Gail Erickson, female    DOB: Mar 11, 1976, 45 y.o.   MRN: [id number]  Chief Complaint  Patient presents with   Pre-op Exam      ICD-10-CM   1. Malignant neoplasm of overlapping sites of right breast [facility])  C50.811       History of Present Illness: Gail Erickson is a 45 y.o.  female  with a history of right breast cancer, DCIS.  She presents for preoperative evaluation for upcoming procedure, removal of right breast tissue expander and placement of right breast implant and left breast mastopexy, scheduled for 09/23/2020 with Dr. Marla Roe.  The patient has not had problems with anesthesia. No history of DVT/PE.  No family history of DVT/PE.  No family or personal history of bleeding or clotting disorders.  Patient is not currently taking any blood thinners.  No history of CVA/MI.  Mom with history of phlebitis.  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  PMH Significant for: Migraines, prediabetes  Patient currently has 625 out of 535 cc in her right breast tissue expander. She has confirmed that she would like to use silicone implants.  Past Medical History: Allergies: No Known Allergies  Current Medications:  Current Outpatient Medications:    Ascorbic Acid (VITAMIN C) 1000 MG tablet, Take 1,000 mg by mouth daily., Disp: , Rfl:    Aspirin-Acetaminophen-Caffeine (EXCEDRIN MIGRAINE PO), Take 1 tablet by mouth as needed., Disp: , Rfl:    ibuprofen (ADVIL) 600 MG tablet, Take 1 tablet (600 mg total) by mouth every 6 (six) hours as needed for mild pain or moderate pain. For use AFTER surgery, Disp: 30 tablet, Rfl: 0   Multiple Vitamin (MULTIVITAMIN) tablet, Take 1 tablet by mouth daily., Disp: , Rfl:    Omega-3 Fatty Acids (FISH OIL) 1000 MG CAPS, Take by mouth., Disp: , Rfl:    Probiotic Product (PROBIOTIC-10 PO), Take 1 tablet by mouth., Disp: , Rfl:    VITAMIN D PO, Take 1,000 Int'l Units by mouth., Disp: , Rfl:    diazepam  (VALIUM) 2 MG tablet, Take 1 tablet (2 mg total) by mouth every 12 (twelve) hours as needed for muscle spasms. (Patient not taking: Reported on 09/10/2020), Disp: 20 tablet, Rfl: 0   tamoxifen (NOLVADEX) 20 MG tablet, TAKE 1 TABLET(20 MG) BY MOUTH DAILY (Patient not taking: Reported on 09/10/2020), Disp: 30 tablet, Rfl: 3  Past Medical Problems: Past Medical History:  Diagnosis Date   Abnormal Pap smear of cervix 06-02-14   LGSIL:neg HR HPV (1st abn. 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   Migraine without aura    Pre-diabetes    Prediabetes 2021   sees Dr.Balan   Vitamin D deficiency 05/21/15   level = 10    Past Surgical History: Past Surgical History:  Procedure Laterality Date   BREAST RECONSTRUCTION WITH PLACEMENT OF TISSUE EXPANDER AND FLEX HD (ACELLULAR HYDRATED DERMIS) Right 04/28/2020   Procedure: IMMEDIATE RIGHT BREAST RECONSTRUCTION WITH PLACEMENT OF TISSUE EXPANDER AND FLEX HD (ACELLULAR HYDRATED DERMIS);  Surgeon: Wallace Going, DO;  Location: [facility];  Service: Plastics;  Laterality: Right;   COLPOSCOPY     MASTECTOMY W/ SENTINEL NODE BIOPSY Right  04/28/2020   Procedure: RIGHT MASTECTOMY WITH SENTINEL LYMPH NODE BIOPSY;  Surgeon: [name], MD;  Location: [facility];  Service: General;  Laterality: Right;  PECTORAL BLOCK   UMBILICAL HERNIA REPAIR     --as a child    Social History: Social History   Socioeconomic History   Marital status: Single    Spouse name: Not on file   Number of children: 0   Years of education: Not on file   Highest education level: Not on file  Occupational History   Not on file  Tobacco Use   Smoking status: Never   Smokeless tobacco: Never  Vaping Use   Vaping Use: Never used  Substance and Sexual Activity   Alcohol use: Yes     Comment: 1 drink per month   Drug use: Never   Sexual activity: Yes    Partners: Male    Birth control/protection: Condom    Comment: condoms everytime, not sexually active since 4/22  Other Topics Concern   Not on file  Social History Narrative   -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 Breast ca--deceased CHF   Breast cancer Cousin 50       maternal first cousin died Breast ca age 74   Hyperlipidemia Father    HIV/AIDS Maternal Uncle    Cancer Paternal Uncle        NOS - needed BMT   Cancer Maternal Grandmother 68       adrenal gland cancer   Breast cancer Paternal Grandmother        dx in her mid to late 27s   Aneurysm Maternal Aunt 26       Brain   Kidney disease Maternal Uncle    Cancer Other        2 maternal great uncles with cancer NOS   Breast cancer Maternal Aunt 72       Breast cancer   Breast cancer Maternal Aunt 72    Review of Systems: Review of Systems  Constitutional: Negative.   Respiratory: Negative.    Cardiovascular: Negative.   Gastrointestinal: Negative.   Musculoskeletal:  Positive for myalgias.  Neurological: Negative.    Physical Exam: Vital Signs BP 125/85 (BP Location: Left Arm, Patient Position: Sitting, Cuff Size: Large)   Pulse 75   Ht 5\' 9"  (1.753 m)   Wt 227 lb (103 kg)   LMP 09/02/2020 (Exact Date)   SpO2 95%   BMI 33.52 kg/m   Physical Exam  Constitutional:       General: Not in acute distress.    Appearance: Normal appearance. Not ill-appearing.  HENT:     Head: Normocephalic and atraumatic.  Eyes:     Pupils: Pupils are equal, round Neck:     Musculoskeletal: Normal range of motion.  Cardiovascular:  Rate and Rhythm: Normal rate    Pulses: Normal pulses.  Pulmonary:     Effort: Pulmonary effort is normal. No respiratory distress.  Abdominal:     General: Abdomen is flat. There is no distension.  Musculoskeletal: Normal range of motion.  Skin:    General: Skin is warm and dry.     Findings: No erythema or rash.  Neurological:     General: No focal deficit present.     Mental Status: Alert and oriented to person, place, and time. Mental status is at baseline.     Motor: No weakness.  Psychiatric:        Mood and Affect: Mood normal.        Behavior: Behavior normal.    Assessment/Plan: The patient is scheduled for exchange of right breast tissue expander for silicone implant and left breast mastopexy with Dr. Marla Roe.  Risks, benefits, and alternatives of procedure discussed, questions answered and consent obtained.    Smoking Status: Non-smoker; Counseling Given?  N/A Last Mammogram: 01/14/2020; Results: Benign right breast cyst  Caprini Score: 6, high; Risk Factors include: Age, BMI rater than 25, history of right breast cancer and length of planned surgery. Recommendation for mechanical prophylaxis. Encourage early ambulation.   Pictures obtained: 08/06/2020  Post-op Rx sent to pharmacy: Norco, Zofran, Keflex, ibuprofen  Patient was provided with the General Surgical Risk consent document and Pain Medication Agreement prior to their appointment.  They had adequate time to read through the risk consent documents and Pain Medication Agreement. We also discussed them in person together during this preop appointment. All of their questions were answered to their satisfaction.  Recommended calling if they have any further  questions.  Risk consent form and Pain Medication Agreement to be scanned into patient's chart.  The risks that can be encountered with and after placement of a breast implant were discussed and include the following but not limited to these: bleeding, infection, delayed healing, anesthesia risks, skin sensation changes, injury to structures including nerves, blood vessels, and muscles which may be temporary or permanent, allergies to tape, suture materials and glues, blood products, topical preparations or injected agents, skin contour irregularities, skin discoloration and swelling, deep vein thrombosis, cardiac and pulmonary complications, pain, which may persist, fluid accumulation, wrinkling of the skin over the implanmt, changes in nipple or breast sensation, implant leakage or rupture, faulty position of the implant, persistent pain, formation of tight scar tissue around the implant (capsular contracture).  Patient was provided with the [facility] implant patient decision checklist and this was completed during today's preoperative evaluation. Patient had time to read through the information and any questions were answered to their content. Form will be scanned into patient's chart.  Informed patient to avoid taking ibuprofen or Excedrin 1 week prior to surgery.  Hold any supplements 2 weeks prior to surgery.  Electronically signed by: Carola Rhine Lexey Fletes, PA-C 09/10/2020 9:20 AM

## 2020-09-11 ENCOUNTER — Other Ambulatory Visit: Payer: Self-pay | Admitting: Hematology

## 2020-09-11 DIAGNOSIS — C50811 Malignant neoplasm of overlapping sites of right female breast: Secondary | ICD-10-CM

## 2020-09-14 ENCOUNTER — Encounter (INDEPENDENT_AMBULATORY_CARE_PROVIDER_SITE_OTHER): Payer: Self-pay | Admitting: *Deleted

## 2020-09-16 ENCOUNTER — Encounter (HOSPITAL_BASED_OUTPATIENT_CLINIC_OR_DEPARTMENT_OTHER): Payer: Self-pay | Admitting: Plastic Surgery

## 2020-09-16 ENCOUNTER — Other Ambulatory Visit: Payer: Self-pay

## 2020-09-22 NOTE — Anesthesia Preprocedure Evaluation (Addendum)
Anesthesia Evaluation  Patient identified by MRN, date of birth, ID band Patient awake    Reviewed: Allergy & Precautions, NPO status , Patient's Chart, lab work & pertinent test results  History of Anesthesia Complications Negative for: history of anesthetic complications  Airway Mallampati: II  TM Distance: >3 FB Neck ROM: Full    Dental no notable dental hx.    Pulmonary neg pulmonary ROS,    Pulmonary exam normal        Cardiovascular negative cardio ROS Normal cardiovascular exam     Neuro/Psych  Headaches, negative psych ROS   GI/Hepatic negative GI ROS, Neg liver ROS,   Endo/Other  BMI 34  Renal/GU negative Renal ROS  negative genitourinary   Musculoskeletal negative musculoskeletal ROS (+)   Abdominal   Peds  Hematology negative hematology ROS (+)   Anesthesia Other Findings Right breast ca  Reproductive/Obstetrics negative OB ROS                            Anesthesia Physical Anesthesia Plan  ASA: 2  Anesthesia Plan: General   Post-op Pain Management:    Induction: Intravenous  PONV Risk Score and Plan: 3 and Treatment may vary due to age or medical condition, Ondansetron, Dexamethasone, Midazolam and Scopolamine patch - Pre-op  Airway Management Planned: LMA  Additional Equipment:   Intra-op Plan:   Post-operative Plan: Extubation in OR  Informed Consent: I have reviewed the patients History and Physical, chart, labs and discussed the procedure including the risks, benefits and alternatives for the proposed anesthesia with the patient or authorized representative who has indicated his/her understanding and acceptance.     Dental advisory given  Plan Discussed with: CRNA  Anesthesia Plan Comments:        Anesthesia Quick Evaluation

## 2020-09-23 ENCOUNTER — Ambulatory Visit (HOSPITAL_BASED_OUTPATIENT_CLINIC_OR_DEPARTMENT_OTHER): Payer: BC Managed Care – PPO | Admitting: Anesthesiology

## 2020-09-23 ENCOUNTER — Other Ambulatory Visit: Payer: Self-pay

## 2020-09-23 ENCOUNTER — Ambulatory Visit (HOSPITAL_BASED_OUTPATIENT_CLINIC_OR_DEPARTMENT_OTHER)
Admission: RE | Admit: 2020-09-23 | Discharge: 2020-09-23 | Disposition: A | Discharge status: Home / Self Care | Payer: BC Managed Care – PPO | Attending: Plastic Surgery | Admitting: Plastic Surgery

## 2020-09-23 ENCOUNTER — Encounter (HOSPITAL_BASED_OUTPATIENT_CLINIC_OR_DEPARTMENT_OTHER): Payer: Self-pay | Admitting: Plastic Surgery

## 2020-09-23 ENCOUNTER — Encounter (HOSPITAL_BASED_OUTPATIENT_CLINIC_OR_DEPARTMENT_OTHER)
Admission: RE | Disposition: A | Discharge status: Home / Self Care | Payer: Self-pay | Source: Home / Self Care | Attending: Plastic Surgery

## 2020-09-23 DIAGNOSIS — Z853 Personal history of malignant neoplasm of breast: Secondary | ICD-10-CM | POA: Insufficient documentation

## 2020-09-23 DIAGNOSIS — Z45811 Encounter for adjustment or removal of right breast implant: Secondary | ICD-10-CM | POA: Diagnosis not present

## 2020-09-23 DIAGNOSIS — N651 Disproportion of reconstructed breast: Secondary | ICD-10-CM | POA: Diagnosis not present

## 2020-09-23 DIAGNOSIS — Z45812 Encounter for adjustment or removal of left breast implant: Secondary | ICD-10-CM | POA: Diagnosis not present

## 2020-09-23 DIAGNOSIS — N6012 Diffuse cystic mastopathy of left breast: Secondary | ICD-10-CM | POA: Insufficient documentation

## 2020-09-23 DIAGNOSIS — Z9013 Acquired absence of bilateral breasts and nipples: Secondary | ICD-10-CM | POA: Diagnosis not present

## 2020-09-23 DIAGNOSIS — C50811 Malignant neoplasm of overlapping sites of right female breast: Secondary | ICD-10-CM | POA: Diagnosis not present

## 2020-09-23 HISTORY — PX: REMOVAL OF TISSUE EXPANDER AND PLACEMENT OF IMPLANT: SHX6457

## 2020-09-23 HISTORY — PX: MASTOPEXY: SHX5358

## 2020-09-23 LAB — POCT PREGNANCY, URINE: Preg Test, Ur: NEGATIVE

## 2020-09-23 SURGERY — REMOVAL, TISSUE EXPANDER, BREAST, WITH IMPLANT INSERTION
Anesthesia: General | Site: Breast | Laterality: Right

## 2020-09-23 MED ORDER — EPINEPHRINE PF 1 MG/ML IJ SOLN
INTRAMUSCULAR | Status: AC
  Filled 2020-09-23: qty 1

## 2020-09-23 MED ORDER — BUPIVACAINE HCL (PF) 0.25 % IJ SOLN
INTRAMUSCULAR | Status: AC
  Filled 2020-09-23: qty 90

## 2020-09-23 MED ORDER — ACETAMINOPHEN 500 MG PO TABS
ORAL_TABLET | ORAL | Status: AC
  Filled 2020-09-23: qty 1

## 2020-09-23 MED ORDER — PROPOFOL 10 MG/ML IV BOLUS
INTRAVENOUS | Status: DC | PRN
  Administered 2020-09-23: 200 mg via INTRAVENOUS

## 2020-09-23 MED ORDER — EPHEDRINE 5 MG/ML INJ
INTRAVENOUS | Status: AC
  Filled 2020-09-23: qty 10

## 2020-09-23 MED ORDER — FENTANYL CITRATE (PF) 100 MCG/2ML IJ SOLN
INTRAMUSCULAR | Status: AC
  Filled 2020-09-23: qty 2

## 2020-09-23 MED ORDER — SODIUM CHLORIDE 0.9% FLUSH: 3.0000 mL | INTRAVENOUS | Status: DC | PRN

## 2020-09-23 MED ORDER — CEFAZOLIN SODIUM-DEXTROSE 2-4 GM/100ML-% IV SOLN
2.0000 g | INTRAVENOUS | Status: AC
  Administered 2020-09-23: 2 g via INTRAVENOUS

## 2020-09-23 MED ORDER — OXYCODONE HCL 5 MG PO TABS: 5.0000 mg | ORAL_TABLET | ORAL | Status: DC | PRN

## 2020-09-23 MED ORDER — LIDOCAINE 2% (20 MG/ML) 5 ML SYRINGE
INTRAMUSCULAR | Status: DC | PRN
  Administered 2020-09-23: 100 mg via INTRAVENOUS

## 2020-09-23 MED ORDER — LIDOCAINE HCL (PF) 1 % IJ SOLN
INTRAMUSCULAR | Status: AC
  Filled 2020-09-23: qty 60

## 2020-09-23 MED ORDER — PROPOFOL 500 MG/50ML IV EMUL
INTRAVENOUS | Status: AC
  Filled 2020-09-23: qty 50

## 2020-09-23 MED ORDER — SODIUM CHLORIDE 0.9 % IV SOLN
INTRAVENOUS | Status: AC
  Filled 2020-09-23: qty 10

## 2020-09-23 MED ORDER — SCOPOLAMINE 1 MG/3DAYS TD PT72
MEDICATED_PATCH | TRANSDERMAL | Status: AC
  Filled 2020-09-23: qty 1

## 2020-09-23 MED ORDER — CHLORHEXIDINE GLUCONATE CLOTH 2 % EX PADS: 6.0000 | MEDICATED_PAD | Freq: Once | CUTANEOUS | Status: DC

## 2020-09-23 MED ORDER — LIDOCAINE-EPINEPHRINE 1 %-1:100000 IJ SOLN
INTRAMUSCULAR | Status: AC
  Filled 2020-09-23: qty 3

## 2020-09-23 MED ORDER — ACETAMINOPHEN 325 MG PO TABS: 650.0000 mg | ORAL_TABLET | ORAL | Status: DC | PRN

## 2020-09-23 MED ORDER — PROMETHAZINE HCL 25 MG/ML IJ SOLN: 6.2500 mg | INTRAMUSCULAR | Status: DC | PRN

## 2020-09-23 MED ORDER — ACETAMINOPHEN 500 MG PO TABS
1000.0000 mg | ORAL_TABLET | Freq: Once | ORAL | Status: AC
  Administered 2020-09-23: 1000 mg via ORAL

## 2020-09-23 MED ORDER — SUCCINYLCHOLINE CHLORIDE 200 MG/10ML IV SOSY
PREFILLED_SYRINGE | INTRAVENOUS | Status: AC
  Filled 2020-09-23: qty 10

## 2020-09-23 MED ORDER — MIDAZOLAM HCL 5 MG/5ML IJ SOLN
INTRAMUSCULAR | Status: DC | PRN
  Administered 2020-09-23: 2 mg via INTRAVENOUS

## 2020-09-23 MED ORDER — MIDAZOLAM HCL 2 MG/2ML IJ SOLN
INTRAMUSCULAR | Status: AC
  Filled 2020-09-23: qty 2

## 2020-09-23 MED ORDER — OXYCODONE HCL 5 MG PO TABS
5.0000 mg | ORAL_TABLET | Freq: Once | ORAL | Status: AC | PRN
  Administered 2020-09-23: 5 mg via ORAL

## 2020-09-23 MED ORDER — FENTANYL CITRATE (PF) 100 MCG/2ML IJ SOLN
25.0000 ug | INTRAMUSCULAR | Status: DC | PRN
Start: 2020-09-23 — End: 2020-09-23

## 2020-09-23 MED ORDER — LIDOCAINE HCL (PF) 2 % IJ SOLN
INTRAMUSCULAR | Status: AC
  Filled 2020-09-23: qty 5

## 2020-09-23 MED ORDER — SCOPOLAMINE 1 MG/3DAYS TD PT72
1.0000 | MEDICATED_PATCH | Freq: Once | TRANSDERMAL | Status: DC
  Administered 2020-09-23: 1.5 mg via TRANSDERMAL

## 2020-09-23 MED ORDER — ONDANSETRON HCL 4 MG/2ML IJ SOLN
INTRAMUSCULAR | Status: AC
  Filled 2020-09-23: qty 2

## 2020-09-23 MED ORDER — SODIUM CHLORIDE 0.9 % IV SOLN: 250.0000 mL | INTRAVENOUS | Status: DC | PRN

## 2020-09-23 MED ORDER — LIDOCAINE-EPINEPHRINE 1 %-1:100000 IJ SOLN
INTRAMUSCULAR | Status: DC | PRN
  Administered 2020-09-23: 26 mL

## 2020-09-23 MED ORDER — ACETAMINOPHEN 325 MG RE SUPP: 650.0000 mg | RECTAL | Status: DC | PRN

## 2020-09-23 MED ORDER — SODIUM CHLORIDE 0.9% FLUSH: 3.0000 mL | Freq: Two times a day (BID) | INTRAVENOUS | Status: DC

## 2020-09-23 MED ORDER — CEFAZOLIN SODIUM-DEXTROSE 2-4 GM/100ML-% IV SOLN
INTRAVENOUS | Status: AC
  Filled 2020-09-23: qty 100

## 2020-09-23 MED ORDER — OXYCODONE HCL 5 MG PO TABS
ORAL_TABLET | ORAL | Status: AC
  Filled 2020-09-23: qty 1

## 2020-09-23 MED ORDER — EPHEDRINE SULFATE 50 MG/ML IJ SOLN
INTRAMUSCULAR | Status: DC | PRN
  Administered 2020-09-23: 10 mg via INTRAVENOUS

## 2020-09-23 MED ORDER — LACTATED RINGERS IV SOLN: INTRAVENOUS | Status: DC

## 2020-09-23 MED ORDER — OXYCODONE HCL 5 MG/5ML PO SOLN: 5.0000 mg | Freq: Once | ORAL | Status: AC | PRN

## 2020-09-23 MED ORDER — FENTANYL CITRATE (PF) 100 MCG/2ML IJ SOLN
INTRAMUSCULAR | Status: DC | PRN
  Administered 2020-09-23 (×4): 50 ug via INTRAVENOUS

## 2020-09-23 MED ORDER — PHENYLEPHRINE 40 MCG/ML (10ML) SYRINGE FOR IV PUSH (FOR BLOOD PRESSURE SUPPORT)
PREFILLED_SYRINGE | INTRAVENOUS | Status: AC
  Filled 2020-09-23: qty 10

## 2020-09-23 MED ORDER — DEXAMETHASONE SODIUM PHOSPHATE 10 MG/ML IJ SOLN
INTRAMUSCULAR | Status: AC
  Filled 2020-09-23: qty 1

## 2020-09-23 MED ORDER — SODIUM CHLORIDE 0.9 % IV SOLN
INTRAVENOUS | Status: DC | PRN
  Administered 2020-09-23: 500 mL

## 2020-09-23 MED ORDER — ACETAMINOPHEN 500 MG PO TABS
ORAL_TABLET | ORAL | Status: AC
  Filled 2020-09-23: qty 2

## 2020-09-23 MED ORDER — DEXAMETHASONE SODIUM PHOSPHATE 4 MG/ML IJ SOLN
INTRAMUSCULAR | Status: DC | PRN
  Administered 2020-09-23: 10 mg via INTRAVENOUS

## 2020-09-23 MED ORDER — FENTANYL CITRATE (PF) 100 MCG/2ML IJ SOLN
25.0000 ug | INTRAMUSCULAR | Status: DC | PRN
  Administered 2020-09-23: 50 ug via INTRAVENOUS

## 2020-09-23 SURGICAL SUPPLY — 77 items
ADH SKN CLS APL DERMABOND .7 (GAUZE/BANDAGES/DRESSINGS) ×4
BAG DECANTER FOR FLEXI CONT (MISCELLANEOUS) ×3 IMPLANT
BINDER BREAST LRG (GAUZE/BANDAGES/DRESSINGS) IMPLANT
BINDER BREAST MEDIUM (GAUZE/BANDAGES/DRESSINGS) IMPLANT
BINDER BREAST XLRG (GAUZE/BANDAGES/DRESSINGS) ×3 IMPLANT
BINDER BREAST XXLRG (GAUZE/BANDAGES/DRESSINGS) IMPLANT
BIOPATCH RED 1 DISK 7.0 (GAUZE/BANDAGES/DRESSINGS) IMPLANT
BLADE HEX COATED 2.75 (ELECTRODE) IMPLANT
BLADE SURG 10 STRL SS (BLADE) ×3 IMPLANT
BLADE SURG 15 STRL LF DISP TIS (BLADE) ×2 IMPLANT
BLADE SURG 15 STRL SS (BLADE) ×3
BNDG GAUZE ELAST 4 BULKY (GAUZE/BANDAGES/DRESSINGS) IMPLANT
CANISTER SUCT 1200ML W/VALVE (MISCELLANEOUS) ×3 IMPLANT
COVER BACK TABLE 60X90IN (DRAPES) ×3 IMPLANT
COVER MAYO STAND STRL (DRAPES) ×3 IMPLANT
DECANTER SPIKE VIAL GLASS SM (MISCELLANEOUS) IMPLANT
DERMABOND ADVANCED (GAUZE/BANDAGES/DRESSINGS) ×2
DERMABOND ADVANCED .7 DNX12 (GAUZE/BANDAGES/DRESSINGS) ×4 IMPLANT
DRAIN CHANNEL 19F RND (DRAIN) IMPLANT
DRAPE LAPAROSCOPIC ABDOMINAL (DRAPES) ×3 IMPLANT
DRSG OPSITE POSTOP 4X6 (GAUZE/BANDAGES/DRESSINGS) ×6 IMPLANT
DRSG PAD ABDOMINAL 8X10 ST (GAUZE/BANDAGES/DRESSINGS) ×6 IMPLANT
DRSG TEGADERM 2-3/8X2-3/4 SM (GAUZE/BANDAGES/DRESSINGS) IMPLANT
ELECT BLADE 4.0 EZ CLEAN MEGAD (MISCELLANEOUS) ×3
ELECT COATED BLADE 2.86 ST (ELECTRODE) IMPLANT
ELECT REM PT RETURN 9FT ADLT (ELECTROSURGICAL) ×3
ELECTRODE BLDE 4.0 EZ CLN MEGD (MISCELLANEOUS) ×2 IMPLANT
ELECTRODE REM PT RTRN 9FT ADLT (ELECTROSURGICAL) ×2 IMPLANT
EVACUATOR SILICONE 100CC (DRAIN) IMPLANT
FUNNEL KELLER 2 DISP (MISCELLANEOUS) ×3 IMPLANT
GAUZE SPONGE 4X4 12PLY STRL (GAUZE/BANDAGES/DRESSINGS) IMPLANT
GAUZE SPONGE 4X4 12PLY STRL LF (GAUZE/BANDAGES/DRESSINGS) IMPLANT
GLOVE SURG ENC MOIS LTX SZ6 (GLOVE) ×3 IMPLANT
GLOVE SURG ENC MOIS LTX SZ6.5 (GLOVE) ×9 IMPLANT
GLOVE SURG POLYISO LF SZ7 (GLOVE) ×3 IMPLANT
GLOVE SURG UNDER POLY LF SZ6.5 (GLOVE) ×3 IMPLANT
GOWN STRL REUS W/ TWL LRG LVL3 (GOWN DISPOSABLE) ×8 IMPLANT
GOWN STRL REUS W/TWL LRG LVL3 (GOWN DISPOSABLE) ×12
IMPL BREAST P6.5XULT HI 650 (Breast) ×2 IMPLANT
IMPL BRST P6.5XULT HI 650CC (Breast) ×2 IMPLANT
IMPLANT BREAST GEL 650CC (Breast) ×3 IMPLANT
IV NS 1000ML (IV SOLUTION)
IV NS 1000ML BAXH (IV SOLUTION) IMPLANT
NDL SAFETY ECLIPSE 18X1.5 (NEEDLE) ×2 IMPLANT
NEEDLE HYPO 18GX1.5 SHARP (NEEDLE) ×3
NEEDLE HYPO 25X1 1.5 SAFETY (NEEDLE) ×3 IMPLANT
NS IRRIG 1000ML POUR BTL (IV SOLUTION) ×3 IMPLANT
PACK BASIN DAY SURGERY FS (CUSTOM PROCEDURE TRAY) ×3 IMPLANT
PENCIL SMOKE EVACUATOR (MISCELLANEOUS) ×3 IMPLANT
PIN SAFETY STERILE (MISCELLANEOUS) IMPLANT
SIZER BREAST REUSE 650CC (SIZER) ×3
SIZER BRST REUSE P6.4 650CC (SIZER) ×2 IMPLANT
SLEEVE SCD COMPRESS KNEE MED (STOCKING) ×3 IMPLANT
SPONGE T-LAP 18X18 ~~LOC~~+RFID (SPONGE) ×6 IMPLANT
STRIP CLOSURE SKIN 1/2X4 (GAUZE/BANDAGES/DRESSINGS) IMPLANT
STRIP SUTURE WOUND CLOSURE 1/2 (MISCELLANEOUS) ×6 IMPLANT
SUT MNCRL AB 3-0 PS2 18 (SUTURE) IMPLANT
SUT MNCRL AB 4-0 PS2 18 (SUTURE) ×9 IMPLANT
SUT MON AB 3-0 SH 27 (SUTURE) ×9
SUT MON AB 3-0 SH27 (SUTURE) ×6 IMPLANT
SUT MON AB 5-0 PS2 18 (SUTURE) ×6 IMPLANT
SUT PDS 3-0 CT2 (SUTURE) ×6
SUT PDS AB 2-0 CT2 27 (SUTURE) IMPLANT
SUT PDS II 3-0 CT2 27 ABS (SUTURE) ×4 IMPLANT
SUT SILK 3 0 PS 1 (SUTURE) IMPLANT
SUT VIC AB 3-0 SH 27 (SUTURE)
SUT VIC AB 3-0 SH 27X BRD (SUTURE) IMPLANT
SUT VICRYL 4-0 PS2 18IN ABS (SUTURE) IMPLANT
SYR 50ML LL SCALE MARK (SYRINGE) ×3 IMPLANT
SYR BULB IRRIG 60ML STRL (SYRINGE) ×3 IMPLANT
SYR CONTROL 10ML LL (SYRINGE) ×3 IMPLANT
TAPE MEASURE VINYL STERILE (MISCELLANEOUS) IMPLANT
TOWEL GREEN STERILE FF (TOWEL DISPOSABLE) ×6 IMPLANT
TRAY DSU PREP LF (CUSTOM PROCEDURE TRAY) ×3 IMPLANT
TUBE CONNECTING 20X1/4 (TUBING) ×3 IMPLANT
UNDERPAD 30X36 HEAVY ABSORB (UNDERPADS AND DIAPERS) ×6 IMPLANT
YANKAUER SUCT BULB TIP NO VENT (SUCTIONS) ×3 IMPLANT

## 2020-09-23 NOTE — Transfer of Care (Signed)
Immediate Anesthesia Transfer of Care Note  Patient: Gail Erickson  Procedure(s) Performed: REMOVAL OF TISSUE EXPANDER AND PLACEMENT OF IMPLANT (Right: Breast) LEFT BREAST MASTOPEXY (Left: Breast)  Patient Location: PACU  Anesthesia Type:General  Level of Consciousness: awake, alert , oriented, drowsy and patient cooperative  Airway & Oxygen Therapy: Patient Spontanous Breathing and Patient connected to face mask oxygen  Post-op Assessment: Report given to RN and Post -op Vital signs reviewed and stable  Post vital signs: Reviewed and stable  Last Vitals:  Vitals Value Taken Time  BP    Temp    Pulse    Resp    SpO2      Last Pain:  Vitals:   09/23/20 0635  TempSrc: Oral  PainSc: 0-No pain      Patients Stated Pain Goal: 4 (42/10/31 2811)  Complications: No notable events documented.

## 2020-09-23 NOTE — Discharge Instructions (Addendum)
INSTRUCTIONS FOR AFTER SURGERY   You will likely have some questions about what to expect following your operation.  The following information will help you and your family understand what to expect when you are discharged from the hospital.  Following these guidelines will help ensure a smooth recovery and reduce risks of complications.  Postoperative instructions include information on: diet, wound care, medications and physical activity.  AFTER SURGERY Expect to go home after the procedure.  In some cases, you may need to spend one night in the hospital for observation.  DIET This surgery does not require a specific diet.  However, I have to mention that the healthier you eat the better your body can start healing. It is important to increasing your protein intake.  This means limiting the foods with added sugar.  Focus on fruits and vegetables and some meat. It is very important to drink water after your surgery.  If your urine is bright yellow, then it is concentrated, and you need to drink more water.  As a general rule after surgery, you should have 8 ounces of water every hour while awake.  If you find you are persistently nauseated or unable to take in liquids let us know.  NO TOBACCO USE or EXPOSURE.  This will slow your healing process and increase the risk of a wound.  WOUND CARE If you don't have a drain: You can shower the day after surgery.  Use fragrance free soap.  Dial, [place], [place] and Cetaphil are usually mild on the skin.  If you have steri-strips / tape directly attached to your skin leave them in place. It is OK to get these wet.  No baths, pools or hot tubs for two weeks. We close your incision to leave the smallest and best-looking scar. No ointment or creams on your incisions until given the go ahead.  Especially not Neosporin (Too many skin reactions with this one).  A few weeks after surgery you can use Mederma and start massaging the scar. We ask you to wear your binder or  sports bra for the first 6 weeks around the clock, including while sleeping. This provides added comfort and helps reduce the fluid accumulation at the surgery site.  ACTIVITY No heavy lifting until cleared by the doctor.  It is OK to walk and climb stairs. In fact, moving your legs is very important to decrease your risk of a blood clot.  It will also help keep you from getting deconditioned.  Every 1 to 2 hours get up and walk for 5 minutes. This will help with a quicker recovery back to normal.  Let pain be your guide so you don't do too much.  NO, you cannot do the spring cleaning and don't plan on taking care of anyone else.  This is your time for TLC.   WORK Everyone returns to work at different times. As a rough guide, most people take at least 1 - 2 weeks off prior to returning to work. If you need documentation for your job, bring the forms to your postoperative follow up visit.  DRIVING Arrange for someone to bring you home from the hospital.  You may be able to drive a few days after surgery but not while taking any narcotics or valium.  BOWEL MOVEMENTS Constipation can occur after anesthesia and while taking pain medication.  It is important to stay ahead for your comfort.  We recommend taking Milk of Magnesia (2 tablespoons; twice a day) while taking  the pain pills.  SEROMA This is fluid your body tried to put in the surgical site.  This is normal but if it creates excessive pain and swelling let us know.  It usually decreases in a few weeks.  MEDICATIONS and PAIN CONTROL At your preoperative visit for you history and physical you were given the following medications: An antibiotic: Start this medication when you get home and take according to the instructions on the bottle. Zofran 4 mg:  This is to treat nausea and vomiting.  You can take this every 6 hours as needed and only if needed. Norco (hydrocodone/acetaminophen) 5/325 mg:  This is only to be used after you have taken the  motrin or the tylenol. Every 8 hours as needed. Over the counter Medication to take: Ibuprofen (Motrin) 600 mg:  Take this every 6 hours.  If you have additional pain then take 500 mg of the tylenol.  Only take the Norco after you have tried these two. Miralax or stool softener of choice: Take this according to the bottle if you take the [place] Call your surgeon's office if any of the following occur:  Fever 101 degrees F or greater  Excessive bleeding or fluid from the incision site.  Pain that increases over time without aid from the medications  Redness, warmth, or pus draining from incision sites  Persistent nausea or inability to take in liquids  Severe misshapen area that underwent the operation.  No tylenol until after 12:45pm today.   Post Anesthesia Home Care Instructions  Activity: Get plenty of rest for the remainder of the day. A responsible individual must stay with you for 24 hours following the procedure.  For the next 24 hours, DO NOT: -Drive a car -Paediatric nurse -Drink alcoholic beverages -Take any medication unless instructed by your physician -Make any legal decisions or sign important papers.  Meals: Start with liquid foods such as gelatin or soup. Progress to regular foods as tolerated. Avoid greasy, spicy, heavy foods. If nausea and/or vomiting occur, drink only clear liquids until the nausea and/or vomiting subsides. Call your physician if vomiting continues.  Special Instructions/Symptoms: Your throat may feel dry or sore from the anesthesia or the breathing tube placed in your throat during surgery. If this causes discomfort, gargle with warm salt water. The discomfort should disappear within 24 hours.  If you had a scopolamine patch placed behind your ear for the management of post- operative nausea and/or vomiting:  1. The medication in the patch is effective for 72 hours, after which it should be removed.  Wrap patch in a tissue and  discard in the trash. Wash hands thoroughly with soap and water. 2. You may remove the patch earlier than 72 hours if you experience unpleasant side effects which may include dry mouth, dizziness or visual disturbances. 3. Avoid touching the patch. Wash your hands with soap and water after contact with the patch.

## 2020-09-23 NOTE — Op Note (Addendum)
Op report Bilateral Exchange   DATE OF OPERATION: 09/23/2020  LOCATION: [facility]  SURGICAL DIVISION: Plastic Surgery  PREOPERATIVE DIAGNOSIS:  History of breast cancer.  2.   Acquired absence of bilateral breast.   POSTOPERATIVE DIAGNOSIS:  1.  History of breast cancer.  2.  Acquired absence of bilateral breast.   PROCEDURE:  1. Right exchange of tissue expander for implant.  2. Right capsulotomies for implant respositioning. 3. Left breast mastopexy for symmetry  SURGEON: Lyndee Leo [place], DO  ASSISTANT: Roetta Sessions, PA  ANESTHESIA:  General.   COMPLICATIONS: None.   IMPLANTS: Right - [employer] Smooth Round Ultra High Profile Gel 650cc. Ref #[id number].  Serial Number [id number]  INDICATIONS FOR PROCEDURE:  The patient, Gail Erickson, is a 45 y.o. female born on 1975-10-04, is here for treatment after right mastectomy.  She had tissue expander placed at the time of mastectomy. She now presents for exchange of her expander for an implant.  She requires capsulotomies to better position the implant and a left breast mastopexy for symmetry. MRN: [id number]  CONSENT:  Informed consent was obtained directly from the patient. Risks, benefits and alternatives were fully discussed. Specific risks including but not limited to bleeding, infection, hematoma, seroma, scarring, pain, implant infection, implant extrusion, capsular contracture, asymmetry, wound healing problems, and need for further surgery were all discussed. The patient did have an ample opportunity to have her questions answered to her satisfaction.   DESCRIPTION OF PROCEDURE:  The patient was taken to the operating room. SCDs were placed and IV antibiotics were given. The patient's chest was prepped and draped in a sterile fashion. A time out was performed and the implants to be used were identified.    Right breast: One percent Lidocaine with epinephrine was used to infiltrate at the  incision site. The old mastectomy scar was incised.  The mastectomy flaps from the superior and inferior flaps were raised over the pectoralis major muscle for several centimeters to minimize tension for the closure. The pectoralis was split inferior to the skin incision to expose and remove the tissue expander.  Inspection of the pocket showed a normal healthy capsule and good integration of the biologic matrix.  The pocket was irrigated with antibiotic solution.  Circumferential capsulotomies were performed to allow for breast pocket expansion.  Measurements were made and a sizer used to confirm adequate pocket size for the implant dimensions.  Hemostasis was ensured with electrocautery. New gloves were placed. The implant was soaked in antibiotic solution and then placed in the pocket and oriented appropriately. The pectoralis major muscle and capsule on the anterior surface were re-closed with a 3-0 PDS and Monocryl suture. The remaining skin was closed with 4-0 Monocryl deep dermal and 5-0 Monocryl subcuticular stitches.   Left side: Preoperative markings were confirmed.  Incision lines were injected with local with epinephrine.  After waiting for vasoconstriction, the marked lines were incised.  The skin was de-epithelializing around the areola and the pedicle.  A small amount of the inferior vertical limb fat was excised.  Care was taken to not undermine the breast pedicle. Hemostasis was achieved.  The nipple was gently raised into position and the soft tissue was closed with 3 and 4-0 Monocryl.  A small amount of liposuction was done inferiorly.  The deep tissues were approximated with 3-0 PDS and Monocryl sutures and the skin was closed with deep dermal and subcuticular 4-0 Monocryl sutures.  Dermabond was applied.  A breast binder and  ABDs were placed.  The nipple and skin flaps had good capillary refill at the end of the procedure.  The patient tolerated the procedure well. The patient was allowed to  wake from anesthesia and taken to the recovery room in satisfactory condition.  The advanced practice practitioner (APP) assisted throughout the case.  The APP was essential in retraction and counter traction when needed to make the case progress smoothly.  This retraction and assistance made it possible to see the tissue plans for the procedure.  The assistance was needed for blood control, tissue re-approximation and assisted with closure of the incision site.

## 2020-09-23 NOTE — Anesthesia Postprocedure Evaluation (Signed)
Anesthesia Post Note  Patient: Gail Erickson  Procedure(s) Performed: REMOVAL OF TISSUE EXPANDER AND PLACEMENT OF IMPLANT (Right: Breast) LEFT BREAST MASTOPEXY (Left: Breast)     Patient location during evaluation: PACU Anesthesia Type: General Level of consciousness: awake and alert and oriented Pain management: pain level controlled Vital Signs Assessment: post-procedure vital signs reviewed and stable Respiratory status: spontaneous breathing, nonlabored ventilation and respiratory function stable Cardiovascular status: blood pressure returned to baseline Postop Assessment: no apparent nausea or vomiting Anesthetic complications: no   No notable events documented.  Last Vitals:  Vitals:   09/23/20 0930 09/23/20 0945  BP: 124/71 122/73  Pulse: 65 65  Resp: 20 (!) 22  Temp:    SpO2: 99% 100%    Last Pain:  Vitals:   09/23/20 0930  TempSrc:   PainSc: [place]

## 2020-09-23 NOTE — Interval H&P Note (Signed)
History and Physical Interval Note:  09/23/2020 7:01 AM  Gail Erickson  has presented today for surgery, with the diagnosis of Malignant neoplasm of overlapping sites of right breast.  The various methods of treatment have been discussed with the patient and family. After consideration of risks, benefits and other options for treatment, the patient has consented to  Procedure(s) with comments: REMOVAL OF TISSUE EXPANDER AND PLACEMENT OF IMPLANT (Right) - 90 min LEFT BREAST MASTOPEXY (Left) as a surgical intervention.  The patient's history has been reviewed, patient examined, no change in status, stable for surgery.  I have reviewed the patient's chart and labs.  Questions were answered to the patient's satisfaction.     Gail Erickson Gail Erickson

## 2020-09-23 NOTE — Anesthesia Procedure Notes (Signed)
Procedure Name: LMA Insertion Date/Time: 09/23/2020 7:42 AM Performed by: Willa Frater, CRNA Pre-anesthesia Checklist: Patient identified, Emergency Drugs available, Suction available and Patient being monitored Patient Re-evaluated:Patient Re-evaluated prior to induction Oxygen Delivery Method: Circle system utilized Preoxygenation: Pre-oxygenation with 100% oxygen Induction Type: IV induction Ventilation: Mask ventilation without difficulty LMA: LMA inserted LMA Size: 4.0 Number of attempts: 1 Airway Equipment and Method: Bite block Placement Confirmation: positive ETCO2 Tube secured with: Tape Dental Injury: Teeth and Oropharynx as per pre-operative assessment

## 2020-09-24 LAB — SURGICAL PATHOLOGY

## 2020-09-24 NOTE — Progress Notes (Signed)
Good News!!! Biopsy was negative for cancer.

## 2020-10-01 ENCOUNTER — Other Ambulatory Visit: Payer: Self-pay

## 2020-10-01 ENCOUNTER — Encounter: Payer: Self-pay | Admitting: Plastic Surgery

## 2020-10-01 ENCOUNTER — Ambulatory Visit (INDEPENDENT_AMBULATORY_CARE_PROVIDER_SITE_OTHER): Payer: BC Managed Care – PPO | Admitting: Plastic Surgery

## 2020-10-01 DIAGNOSIS — Z9011 Acquired absence of right breast and nipple: Secondary | ICD-10-CM

## 2020-10-01 DIAGNOSIS — Z9010 Acquired absence of unspecified breast and nipple: Secondary | ICD-10-CM | POA: Insufficient documentation

## 2020-10-01 NOTE — Progress Notes (Signed)
Patient is a 45 year old female here for follow-up on her right breast implant placement and left breast mastopexy.  She still has a little bit of asymmetry but this is expected.  She will have some settling on the mastopexy side.  This will take a couple of months.  The incisions are intact as well as the dressings.  There is no sign of a hematoma.  Overall she is doing really well.  Her bowels are moving.  She can start with a sports bra or continue with the breast binder which ever is more comfortable.

## 2020-10-03 DIAGNOSIS — Z20822 Contact with and (suspected) exposure to covid-19: Secondary | ICD-10-CM | POA: Diagnosis not present

## 2020-10-05 DIAGNOSIS — C50411 Malignant neoplasm of upper-outer quadrant of right female breast: Secondary | ICD-10-CM | POA: Diagnosis not present

## 2020-10-05 DIAGNOSIS — Z9012 Acquired absence of left breast and nipple: Secondary | ICD-10-CM | POA: Diagnosis not present

## 2020-10-06 DIAGNOSIS — F4320 Adjustment disorder, unspecified: Secondary | ICD-10-CM | POA: Diagnosis not present

## 2020-10-08 DIAGNOSIS — Z20822 Contact with and (suspected) exposure to covid-19: Secondary | ICD-10-CM | POA: Diagnosis not present

## 2020-10-15 ENCOUNTER — Ambulatory Visit (INDEPENDENT_AMBULATORY_CARE_PROVIDER_SITE_OTHER): Payer: BC Managed Care – PPO | Admitting: Surgical

## 2020-10-15 ENCOUNTER — Encounter: Payer: Self-pay | Admitting: Surgical

## 2020-10-15 ENCOUNTER — Other Ambulatory Visit: Payer: Self-pay

## 2020-10-15 DIAGNOSIS — Z9011 Acquired absence of right breast and nipple: Secondary | ICD-10-CM

## 2020-10-15 DIAGNOSIS — C50811 Malignant neoplasm of overlapping sites of right female breast: Secondary | ICD-10-CM

## 2020-10-15 NOTE — Progress Notes (Signed)
Patient is a 45 year old female here for follow-up on her right breast reconstruction.  She underwent removal of right breast tissue expander and placement of right breast implant and left breast mastopexy on 09/23/2020.  She has a Product manager smooth round ultrahigh profile gel 650 cc implant on the right.  She is 3 weeks postop She reports some tenderness on bilateral sides, otherwise is doing well.  She has some questions about fat grafting  Chaperone present on exam On exam left NAC is viable, left breast incisions are intact.  No wounds noted.  No erythema or cellulitic changes noted. Right breast incision is intact, Steri-Strip in place, no erythema or cellulitic changes noted.  Some volume loss medially.   Continue her compressive garment 24/7 for total of 6 weeks after surgery.  Avoid any strenuous activities or heavy lifting for 6 weeks after surgery. There is no sign of infection, seroma, hematoma Recommend calling with questions or concerns.  Recommend following up in 1 month  Patient is interested in possible fat grafting, discussed with patient we would be able to schedule this for 3 to 6 months after her exchange surgery.  She would like to do this prior to the years and if possible.

## 2020-10-19 ENCOUNTER — Ambulatory Visit: Payer: BC Managed Care – PPO | Attending: General Surgery

## 2020-10-19 ENCOUNTER — Other Ambulatory Visit: Payer: Self-pay

## 2020-10-19 DIAGNOSIS — M25511 Pain in right shoulder: Secondary | ICD-10-CM | POA: Diagnosis not present

## 2020-10-19 DIAGNOSIS — R293 Abnormal posture: Secondary | ICD-10-CM | POA: Insufficient documentation

## 2020-10-19 DIAGNOSIS — Z170 Estrogen receptor positive status [ER+]: Secondary | ICD-10-CM | POA: Diagnosis not present

## 2020-10-19 DIAGNOSIS — Z483 Aftercare following surgery for neoplasm: Secondary | ICD-10-CM | POA: Insufficient documentation

## 2020-10-19 DIAGNOSIS — M25611 Stiffness of right shoulder, not elsewhere classified: Secondary | ICD-10-CM | POA: Insufficient documentation

## 2020-10-19 DIAGNOSIS — C50811 Malignant neoplasm of overlapping sites of right female breast: Secondary | ICD-10-CM | POA: Insufficient documentation

## 2020-10-19 DIAGNOSIS — C50411 Malignant neoplasm of upper-outer quadrant of right female breast: Secondary | ICD-10-CM | POA: Diagnosis not present

## 2020-10-19 NOTE — Therapy (Signed)
[place], [place], [address] Phone: [phone]   Fax:  [phone]  Physical Therapy Treatment  Patient Details  Name: Gail Erickson MRN: [id number] Date of Birth: 17-Sep-1975 Referring Provider (PT): Dr. Marlou Starks   Encounter Date: 10/19/2020   PT End of Session - 10/19/20 1202     Visit Number 17    Number of Visits 25    Date for PT Re-Evaluation 11/16/20    PT Start Time 1104    PT Stop Time 1200    PT Time Calculation (min) 56 min    Activity Tolerance Patient tolerated treatment well    Behavior During Therapy [facility] for tasks assessed/performed             Past Medical History:  Diagnosis Date   Abnormal Pap smear of cervix 06-02-14   LGSIL:neg HR HPV (1st abn. 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   Migraine without aura    Pre-diabetes    Prediabetes 2021   sees Dr.Balan   Vitamin D deficiency 05/21/15   level = 10    Past Surgical History:  Procedure Laterality Date   BREAST RECONSTRUCTION WITH PLACEMENT OF TISSUE EXPANDER AND FLEX HD (ACELLULAR HYDRATED DERMIS) Right 04/28/2020   Procedure: IMMEDIATE RIGHT BREAST RECONSTRUCTION WITH PLACEMENT OF TISSUE EXPANDER AND FLEX HD (ACELLULAR HYDRATED DERMIS);  Surgeon: Wallace Going, DO;  Location: [facility];  Service: Plastics;  Laterality: Right;   COLPOSCOPY     MASTECTOMY W/ SENTINEL NODE BIOPSY Right 04/28/2020   Procedure: RIGHT MASTECTOMY WITH SENTINEL LYMPH NODE BIOPSY;  Surgeon: [name], MD;  Location: [facility];  Service: General;  Laterality: Right;  PECTORAL BLOCK   MASTOPEXY Left 09/23/2020   Procedure: LEFT BREAST MASTOPEXY;  Surgeon: Wallace Going, DO;  Location: [facility];  Service: Plastics;   Laterality: Left;   REMOVAL OF TISSUE EXPANDER AND PLACEMENT OF IMPLANT Right 09/23/2020   Procedure: REMOVAL OF TISSUE EXPANDER AND PLACEMENT OF IMPLANT;  Surgeon: Wallace Going, DO;  Location: [facility];  Service: Plastics;  Laterality: Right;  90 min   UMBILICAL HERNIA REPAIR     --as a child    There were no vitals filed for this visit.   Subjective Assessment - 10/19/20 1102     Subjective Pt had  right tissue expander removed on 09/23/2020 and left mastopexy was performed at the same time.  She has been given permission to do shoulder ROM.  Will likely need to do some fat grafting in 3 months.  Left nipple is very sensitive and there is some discomfort around it.  On the right I still have some tenderness at the lateral breast and lat. area.  Got a compression bra and just started wearing it today. Feel like I am doing better with shoulder ROM.    Pertinent History December 6th learned of Cancer from a biopsy done on Dec 3.  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 Did gene testing today, and having a biopsy on left breast tomorrow. Presently planning for right breast mastectomy but awaiting gene testing results. Pt had right mastectomy with SLNB and tissue expanders on 04/28/2020. I am able to sleep  on my right arm longer now, but sometimes my arm feels heavy/tingling when I wake up but it resolves quickly. Started back to the exercises last week. Not presently working.    Currently in Pain? No/denies   Discomfort not pain               [facility] PT Assessment - 10/19/20 0001       Assessment   Medical Diagnosis S/p Right mastectomy    Referring Provider (PT) Dr. Marlou Starks    Onset Date/Surgical Date 04/28/20    Hand Dominance Right      Balance Screen   Has the patient fallen in the past 6 months No    Has the patient had a decrease in activity level because of a fear of falling?  No    Is the patient reluctant to leave their home because of a fear of falling?  No       Observation/Other Assessments   Observations breast incisions healing with steri strips still intact      AROM   Right Shoulder Extension 70 Degrees    Right Shoulder Flexion 150 Degrees    Right Shoulder ABduction 152 Degrees    Right Shoulder External Rotation 87 Degrees    Left Shoulder Extension 57 Degrees    Left Shoulder Flexion 166 Degrees    Left Shoulder ABduction 156 Degrees    Left Shoulder External Rotation 77 Degrees               LYMPHEDEMA/ONCOLOGY QUESTIONNAIRE - 10/19/20 0001       Right Upper Extremity Lymphedema   15 cm Proximal to Olecranon Process 37.7 cm    10 cm Proximal to Olecranon Process 36.8 cm    Olecranon Process 30.2 cm    15 cm Proximal to Ulnar Styloid Process 28.5 cm    10 cm Proximal to Ulnar Styloid Process 24.5 cm    Just Proximal to Ulnar Styloid Process 17.9 cm    Across Hand at [employer] 22.2 cm    At [place] of 2nd Digit 6.4 cm      Left Upper Extremity Lymphedema   15 cm Proximal to Olecranon Process 39.5 cm    10 cm Proximal to Olecranon Process 38.3 cm    Olecranon Process 30.1 cm    15 cm Proximal to Ulnar Styloid Process 27.8 cm    10 cm Proximal to Ulnar Styloid Process 25 cm    Just Proximal to Ulnar Styloid Process 18 cm    Across Hand at [employer] 22 cm    At [place] of 2nd Digit 6.4 cm                        [facility] Adult PT Treatment/Exercise - 10/19/20 0001       Manual Therapy   Soft tissue mobilization soft tissue mobilization in left SL to right lats and scapular area/UT    Passive ROM to the Rt shoulder into flexion, abduction, ER   vcs for relaxation                         PT Long Term Goals - 10/19/20 1209       PT LONG TERM GOAL #1   Title Pt will be independent with HEP to increase right shoulder ROM and strength    Time 6    Period Weeks    Status Achieved  PT LONG TERM GOAL #2   Title Pt will have right shoulder flexion and abduction within 10 degrees of  baseline    Baseline had achieved but since most recent surgery not quite achieved    Time 4    Period Weeks    Status On-going    Target Date 11/16/20      PT LONG TERM GOAL #3   Title Pts quick dash will be no greater than 10%    Time 4    Period Weeks    Status On-going    Target Date 11/16/20      PT LONG TERM GOAL #4   Title Pt will be fit for compression garments prn (sleeve,bra)    Time 4    Period Weeks    Status On-going      PT LONG TERM GOAL #5   Title Pt will have decreased pain/tightness by greater than 50%    Time 4    Period Weeks    Status On-going    Target Date 11/16/20                   Plan - 10/19/20 1203     Clinical Impression Statement Pt returns today for reassessment after having tissue expander on right removed and replaced with implant, and left Mastopexy on 09/23/2020.Incisions are well healed with steri strips present.  AROM is sightly limited on the right with end range discomfort. There is no measureable UE edema. She continues with tenderness at the right lateral breast, lats and scapular area.  She will benefit from skilled PT to address deficits and return to PLOF    Personal Factors and Comorbidities Comorbidity 1    Comorbidities Right breast CA with expanders    Examination-Activity Limitations Reach Overhead;Sleep;Dressing    Examination-Participation Restrictions Occupation    Stability/Clinical Decision Making Stable/Uncomplicated    Clinical Decision Making Low    Rehab Potential Excellent    PT Frequency 2x / week    PT Duration 4 weeks    PT Treatment/Interventions ADLs/Self Care Home Management;Therapeutic exercise;Manual techniques;Patient/family education;Manual lymph drainage;Scar mobilization;Passive range of motion    PT Next Visit Plan STM to pecs,lats, lateral trunk, scapular area, AAROM/PROM, assess incisions when steri strips come off,return to gentle strength    Recommended Other Services pt has a Optician, dispensing and Agree with Plan of Care Patient             Patient will benefit from skilled therapeutic intervention in order to improve the following deficits and impairments:  Decreased activity tolerance, Decreased knowledge of precautions, Decreased range of motion, Decreased strength, Decreased scar mobility, Increased edema, Impaired sensation, Postural dysfunction, Impaired UE functional use, Pain  Visit Diagnosis: Malignant neoplasm of overlapping sites of right breast (HCC)  Stiffness of right shoulder, not elsewhere classified  Abnormal posture  Aftercare following surgery for neoplasm  Malignant neoplasm of upper-outer quadrant of right breast in female, estrogen receptor positive ([place])  Acute pain of right shoulder     Problem List Patient Active Problem List   Diagnosis Date Noted   Acquired absence of breast 10/01/2020   Encounter to establish care 09/09/2020   Screening due 09/09/2020   HLD (hyperlipidemia) 09/09/2020   Vitamin D deficiency 09/09/2020   Genetic testing 03/08/2020   Malignant neoplasm of overlapping sites of right breast ([place]) 02/25/2020   Family history of breast cancer     Claris Pong 10/19/2020, 12:12  PM  [place], [place], [address] Phone: [phone]   Fax:  [phone]  Name: Gail Erickson MRN: [id number] Date of Birth: 10-02-1975  Cheral Almas, PT 10/19/20 12:13 PM

## 2020-10-20 DIAGNOSIS — F4320 Adjustment disorder, unspecified: Secondary | ICD-10-CM | POA: Diagnosis not present

## 2020-10-21 ENCOUNTER — Other Ambulatory Visit: Payer: Self-pay

## 2020-10-21 ENCOUNTER — Ambulatory Visit: Payer: BC Managed Care – PPO

## 2020-10-21 DIAGNOSIS — M25611 Stiffness of right shoulder, not elsewhere classified: Secondary | ICD-10-CM | POA: Diagnosis not present

## 2020-10-21 DIAGNOSIS — C50411 Malignant neoplasm of upper-outer quadrant of right female breast: Secondary | ICD-10-CM | POA: Diagnosis not present

## 2020-10-21 DIAGNOSIS — Z483 Aftercare following surgery for neoplasm: Secondary | ICD-10-CM | POA: Diagnosis not present

## 2020-10-21 DIAGNOSIS — C50811 Malignant neoplasm of overlapping sites of right female breast: Secondary | ICD-10-CM | POA: Diagnosis not present

## 2020-10-21 DIAGNOSIS — R293 Abnormal posture: Secondary | ICD-10-CM | POA: Diagnosis not present

## 2020-10-21 DIAGNOSIS — M25511 Pain in right shoulder: Secondary | ICD-10-CM

## 2020-10-21 DIAGNOSIS — Z170 Estrogen receptor positive status [ER+]: Secondary | ICD-10-CM | POA: Diagnosis not present

## 2020-10-21 NOTE — Therapy (Signed)
[place], [place], [address] Phone: [phone]   Fax:  [phone]  Physical Therapy Treatment  Patient Details  Name: Gail Erickson MRN: [id number] Date of Birth: 11-17-1975 Referring Provider (PT): Dr. Marlou Starks   Encounter Date: 10/21/2020   PT End of Session - 10/21/20 1209     Visit Number 18    Number of Visits 25    Date for PT Re-Evaluation 11/16/20    PT Start Time 1104    PT Stop Time 1159    PT Time Calculation (min) 55 min    Activity Tolerance Patient tolerated treatment well    Behavior During Therapy [facility] for tasks assessed/performed             Past Medical History:  Diagnosis Date   Abnormal Pap smear of cervix 06-02-14   LGSIL:neg HR HPV (1st abn. 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   Migraine without aura    Pre-diabetes    Prediabetes 2021   sees Dr.Balan   Vitamin D deficiency 05/21/15   level = 10    Past Surgical History:  Procedure Laterality Date   BREAST RECONSTRUCTION WITH PLACEMENT OF TISSUE EXPANDER AND FLEX HD (ACELLULAR HYDRATED DERMIS) Right 04/28/2020   Procedure: IMMEDIATE RIGHT BREAST RECONSTRUCTION WITH PLACEMENT OF TISSUE EXPANDER AND FLEX HD (ACELLULAR HYDRATED DERMIS);  Surgeon: Wallace Going, DO;  Location: [facility];  Service: Plastics;  Laterality: Right;   COLPOSCOPY     MASTECTOMY W/ SENTINEL NODE BIOPSY Right 04/28/2020   Procedure: RIGHT MASTECTOMY WITH SENTINEL LYMPH NODE BIOPSY;  Surgeon: [name], MD;  Location: [facility];  Service: General;  Laterality: Right;  PECTORAL BLOCK   MASTOPEXY Left 09/23/2020   Procedure: LEFT BREAST MASTOPEXY;  Surgeon: Wallace Going, DO;  Location: [facility];  Service: Plastics;   Laterality: Left;   REMOVAL OF TISSUE EXPANDER AND PLACEMENT OF IMPLANT Right 09/23/2020   Procedure: REMOVAL OF TISSUE EXPANDER AND PLACEMENT OF IMPLANT;  Surgeon: Wallace Going, DO;  Location: [facility];  Service: Plastics;  Laterality: Right;  90 min   UMBILICAL HERNIA REPAIR     --as a child    There were no vitals filed for this visit.   Subjective Assessment - 10/21/20 1105     Subjective I did OK after last session.  This am I woke up with some pain in the left breast area. It had some sharpness to it.  Its not as intense right now.  I havent taken tylenol or anything yet. I am working on my travel course   Pertinent History December 6th learned of Cancer from a biopsy done on Dec 3.  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 Did gene testing today, and having a biopsy on left breast tomorrow. Presently planning for right breast mastectomy but awaiting gene testing results. Pt had right mastectomy with SLNB and tissue expanders on 04/28/2020. I am able to sleep on my right arm longer now, but sometimes my arm feels heavy/tingling when I wake up but it resolves quickly. Started back to the exercises last week. Not presently working.    Patient Stated Goals Reassess right UE post surgery.  Currently in Pain? Yes    Pain Score 2     Pain Location Breast    Pain Orientation Left    Pain Type Acute pain    Pain Onset 1 to 4 weeks ago    Pain Frequency Intermittent    Multiple Pain Sites No                              supine [facility] Adult PT Treatment/Exercise - 10/21/20 0001       Manual Therapy   Soft tissue mobilization  Supine to the pectoralis, and lats and in SL to lats,lateral border and interscapular area.    Manual Lymphatic Drainage (MLD) initiated MLD to right lateral turnk, Short neck, right axillary and inguinal LN's, Right axillo-inguinal pathway repeated multiple times retracing steps in supine and SL also using posterior interaxillary pathway.     Passive ROM to the Rt shoulder into flexion, abduction, ER   vcs for relaxation                         PT Long Term Goals - 10/19/20 1209       PT LONG TERM GOAL #1   Title Pt will be independent with HEP to increase right shoulder ROM and strength    Time 6    Period Weeks    Status Achieved      PT LONG TERM GOAL #2   Title Pt will have right shoulder flexion and abduction within 10 degrees of baseline    Baseline had achieved but since most recent surgery not quite achieved    Time 4    Period Weeks    Status On-going    Target Date 11/16/20      PT LONG TERM GOAL #3   Title Pts quick dash will be no greater than 10%    Time 4    Period Weeks    Status On-going    Target Date 11/16/20      PT LONG TERM GOAL #4   Title Pt will be fit for compression garments prn (sleeve,bra)    Time 4    Period Weeks    Status On-going      PT LONG TERM GOAL #5   Title Pt will have decreased pain/tightness by greater than 50%    Time 4    Period Weeks    Status On-going    Target Date 11/16/20                   Plan - 10/21/20 1212     Clinical Impression Statement Pt has been able to remove steri strips and incisions were visualized.  Several small stitches still present at right lateral incision, and distal left incision.  Incisions healing well with purple glue still aroung left areola and a small scab appearing place in central right breast.  No redness or other abnormalities.  Scar tissue noted left breast at distal incision that has healed and is OK for gentle scar massage.  Right breast also OK for gentle scar massage except over small scab area.Pt was instructed in gentle massage lightly over and around incisions.  Continued soft tissue mobilization and PROM of right shoulder and inititated MLD to slightly swollen area at lateral trunk.  [facility] PT also confirmed area of swelling and OK to do gentle scar mobs. Pt felt looser and less tender  after session    Personal Factors and Comorbidities Comorbidity 1    Comorbidities Right breast CA with implant on right, mastopexy on left    Examination-Activity Limitations Reach Overhead;Sleep;Dressing    Examination-Participation Restrictions Occupation    Stability/Clinical Decision Making Stable/Uncomplicated    Rehab Potential Excellent    PT Frequency 2x / week    PT Duration 4 weeks    PT Treatment/Interventions ADLs/Self Care Home Management;Therapeutic exercise;Manual techniques;Patient/family education;Manual lymph drainage;Scar mobilization;Passive range of motion    PT Next Visit Plan STM to pecs,lats, lateral trunk, scapular area, AAROM/PROM, assess incisions again, teach pt MLD to right trunk,return to gentle strength    Consulted and Agree with Plan of Care Patient             Patient will benefit from skilled therapeutic intervention in order to improve the following deficits and impairments:  Decreased activity tolerance, Decreased knowledge of precautions, Decreased range of motion, Decreased strength, Decreased scar mobility, Increased edema, Impaired sensation, Postural dysfunction, Impaired UE functional use, Pain  Visit Diagnosis: Malignant neoplasm of overlapping sites of right breast (HCC)  Stiffness of right shoulder, not elsewhere classified  Abnormal posture  Aftercare following surgery for neoplasm  Malignant neoplasm of upper-outer quadrant of right breast in female, estrogen receptor positive ([place])  Acute pain of right shoulder     Problem List Patient Active Problem List   Diagnosis Date Noted   Acquired absence of breast 10/01/2020   Encounter to establish care 09/09/2020   Screening due 09/09/2020   HLD (hyperlipidemia) 09/09/2020   Vitamin D deficiency 09/09/2020   Genetic testing 03/08/2020   Malignant neoplasm of overlapping sites of right breast ([place]) 02/25/2020   Family history of breast cancer     Claris Pong 10/21/2020,  12:18 PM  [place], [place], [address] Phone: [phone]   Fax:  [phone]  Name: Gail Erickson MRN: [id number] Date of Birth: [birth year]  Cheral Almas, PT 10/21/20 12:20 PM

## 2020-10-25 ENCOUNTER — Other Ambulatory Visit: Payer: Self-pay

## 2020-10-25 ENCOUNTER — Ambulatory Visit: Payer: BC Managed Care – PPO

## 2020-10-25 DIAGNOSIS — R293 Abnormal posture: Secondary | ICD-10-CM | POA: Diagnosis not present

## 2020-10-25 DIAGNOSIS — Z483 Aftercare following surgery for neoplasm: Secondary | ICD-10-CM

## 2020-10-25 DIAGNOSIS — M25511 Pain in right shoulder: Secondary | ICD-10-CM | POA: Diagnosis not present

## 2020-10-25 DIAGNOSIS — Z170 Estrogen receptor positive status [ER+]: Secondary | ICD-10-CM

## 2020-10-25 DIAGNOSIS — M25611 Stiffness of right shoulder, not elsewhere classified: Secondary | ICD-10-CM

## 2020-10-25 DIAGNOSIS — C50811 Malignant neoplasm of overlapping sites of right female breast: Secondary | ICD-10-CM

## 2020-10-25 DIAGNOSIS — C50411 Malignant neoplasm of upper-outer quadrant of right female breast: Secondary | ICD-10-CM | POA: Diagnosis not present

## 2020-10-25 NOTE — Therapy (Signed)
[place], [place], [address] Phone: [phone]   Fax:  [phone]  Physical Therapy Treatment  Patient Details  Name: Gail Erickson MRN: [id number] Date of Birth: [birth year] Referring Provider (PT): Dr. Marlou Starks   Encounter Date: 10/25/2020   PT End of Session - 10/25/20 1206     Visit Number 19    Number of Visits 25    Date for PT Re-Evaluation 11/16/20    PT Start Time 1103    PT Stop Time 1158    PT Time Calculation (min) 55 min    Activity Tolerance Patient tolerated treatment well    Behavior During Therapy [facility] for tasks assessed/performed             Past Medical History:  Diagnosis Date   Abnormal Pap smear of cervix 06-02-14   LGSIL:neg HR HPV (1st abn. 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   Migraine without aura    Pre-diabetes    Prediabetes 2021   sees Dr.Balan   Vitamin D deficiency 05/21/15   level = 10    Past Surgical History:  Procedure Laterality Date   BREAST RECONSTRUCTION WITH PLACEMENT OF TISSUE EXPANDER AND FLEX HD (ACELLULAR HYDRATED DERMIS) Right 04/28/2020   Procedure: IMMEDIATE RIGHT BREAST RECONSTRUCTION WITH PLACEMENT OF TISSUE EXPANDER AND FLEX HD (ACELLULAR HYDRATED DERMIS);  Surgeon: Wallace Going, DO;  Location: [facility];  Service: Plastics;  Laterality: Right;   COLPOSCOPY     MASTECTOMY W/ SENTINEL NODE BIOPSY Right 04/28/2020   Procedure: RIGHT MASTECTOMY WITH SENTINEL LYMPH NODE BIOPSY;  Surgeon: [name], MD;  Location: [facility];  Service: General;  Laterality: Right;  PECTORAL BLOCK   MASTOPEXY Left 09/23/2020   Procedure: LEFT BREAST MASTOPEXY;  Surgeon: Wallace Going, DO;  Location: [facility];  Service: Plastics;   Laterality: Left;   REMOVAL OF TISSUE EXPANDER AND PLACEMENT OF IMPLANT Right 09/23/2020   Procedure: REMOVAL OF TISSUE EXPANDER AND PLACEMENT OF IMPLANT;  Surgeon: Wallace Going, DO;  Location: [facility];  Service: Plastics;  Laterality: Right;  90 min   UMBILICAL HERNIA REPAIR     --as a child    There were no vitals filed for this visit.   Subjective Assessment - 10/25/20 1105     Subjective I did OK after last session.  This am I have a little left breast pain, but more achy as opposed to sharp. My arms feel weak.   Pertinent History December 6th learned of Cancer from a biopsy done on Dec 3.  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 Did gene testing today, and having a biopsy on left breast tomorrow. Presently planning for right breast mastectomy but awaiting gene testing results. Pt had right mastectomy with SLNB and tissue expanders on 04/28/2020. I am able to sleep on my right arm longer now, but sometimes my arm feels heavy/tingling when I wake up but it resolves quickly. Started back to the exercises last week. Not presently working.    Currently in Pain? Yes    Pain Score 2     Pain Location Breast    Pain Orientation Left    Pain Descriptors / Indicators  Aching    Pain Type Surgical pain    Pain Onset 1 to 4 weeks ago    Pain Frequency Intermittent    Multiple Pain Sites No                [facility] PT Assessment - 10/25/20 0001       Assessment   Medical Diagnosis S/p Right mastectomy    Referring Provider (PT) Dr. Marlou Starks    Onset Date/Surgical Date 04/28/20    Hand Dominance Right                           [facility] Adult PT Treatment/Exercise - 10/25/20 0001       Shoulder Exercises: Supine   Horizontal ABduction Strengthening;Both;10 reps    Theraband Level (Shoulder Horizontal ABduction) Level 1 (Yellow)    External Rotation Strengthening;Both;10 reps    Theraband Level (Shoulder External Rotation) Level 1 (Yellow)    Flexion  Strengthening;Both;10 reps    Theraband Level (Shoulder Flexion) Level 1 (Yellow)    Diagonals Strengthening;Right;Left;10 reps    Theraband Level (Shoulder Diagonals) Level 1 (Yellow)      Manual Therapy   Soft tissue mobilization to the pectoralis, and lats and in SL to lats,lateral border and interscapular area.    Manual Lymphatic Drainage (MLD) initiated pt performance of  MLD to right lateral trunk, Short neck, right axillary and inguinal LN's, Right axillo-inguinal pathway repeated multiple times retracing steps. Gave written instructions   Passive ROM to the Rt shoulder into flexion, abduction, ER   vcs for relaxation                    PT Education - 10/25/20 1205     Education Details Pt was educated in self MLD to the right lateral trunk., reviewed all supine scapular stabs and gave yellow band    Person(s) Educated Patient    Methods Demonstration;Tactile cues;Verbal cues;Handout    Comprehension Need further instruction                 PT Long Term Goals - 10/19/20 1209       PT LONG TERM GOAL #1   Title Pt will be independent with HEP to increase right shoulder ROM and strength    Time 6    Period Weeks    Status Achieved      PT LONG TERM GOAL #2   Title Pt will have right shoulder flexion and abduction within 10 degrees of baseline    Baseline had achieved but since most recent surgery not quite achieved    Time 4    Period Weeks    Status On-going    Target Date 11/16/20      PT LONG TERM GOAL #3   Title Pts quick dash will be no greater than 10%    Time 4    Period Weeks    Status On-going    Target Date 11/16/20      PT LONG TERM GOAL #4   Title Pt will be fit for compression garments prn (sleeve,bra)    Time 4    Period Weeks    Status On-going      PT LONG TERM GOAL #5   Title Pt will have decreased pain/tightness by greater than 50%    Time 4    Period Weeks    Status On-going    Target Date 11/16/20  Plan - 10/25/20 1207     Clinical Impression Statement Continued soft tissue mobilization and PROM to right shoulder, and instructed pt in self MLD to the right trunk with pt sitting so she could observe better.  She required VC's and TC's but did well overall.  She will benefit from further review.  We resumed supine scapular stabs with yellow band and she was able to perform all without increased pain.    Personal Factors and Comorbidities Comorbidity 1    Comorbidities Right breast CA with implant on right, mastopexy on left    Examination-Activity Limitations Reach Overhead;Sleep;Dressing    Examination-Participation Restrictions Occupation    Stability/Clinical Decision Making Stable/Uncomplicated    Rehab Potential Excellent    PT Frequency 2x / week    PT Duration 4 weeks    PT Treatment/Interventions ADLs/Self Care Home Management;Therapeutic exercise;Manual techniques;Patient/family education;Manual lymph drainage;Scar mobilization;Passive range of motion    PT Next Visit Plan STM to pecs,lats, lateral trunk, scapular area, AAROM/PROM, assess incisions again, teach pt MLD to right trunk,return to gentle strength    PT Home Exercise Plan 4 post op exercises, supine wand flex/scaption, corner stretch, standing band(SR, shoulder extension, bilateral ER)    Recommended Other Services pt to try and get sleeve this week, has prairie hugger bra    Consulted and Agree with Plan of Care Patient             Patient will benefit from skilled therapeutic intervention in order to improve the following deficits and impairments:  Decreased activity tolerance, Decreased knowledge of precautions, Decreased range of motion, Decreased strength, Decreased scar mobility, Increased edema, Impaired sensation, Postural dysfunction, Impaired UE functional use, Pain  Visit Diagnosis: Malignant neoplasm of overlapping sites of right breast (HCC)  Stiffness of right shoulder, not elsewhere  classified  Abnormal posture  Aftercare following surgery for neoplasm  Malignant neoplasm of upper-outer quadrant of right breast in female, estrogen receptor positive ([place])  Acute pain of right shoulder  Estrogen receptor positive     Problem List Patient Active Problem List   Diagnosis Date Noted   Acquired absence of breast 10/01/2020   Encounter to establish care 09/09/2020   Screening due 09/09/2020   HLD (hyperlipidemia) 09/09/2020   Vitamin D deficiency 09/09/2020   Genetic testing 03/08/2020   Malignant neoplasm of overlapping sites of right breast ([place]) 02/25/2020   Family history of breast cancer     Gail Erickson 10/25/2020, 12:12 PM  [place], [place], [address] Phone: [phone]   Fax:  [phone]  Name: Gail Erickson MRN: [id number] Date of Birth: [birth year] Cheral Almas, PT 10/25/20 12:14 PM

## 2020-10-25 NOTE — Patient Instructions (Addendum)
Over Head Pull: Narrow and Wide Grip   Cancer Rehab [phone]   On back, knees bent, feet flat, band across thighs, elbows straight but relaxed. Pull hands apart (start). Keeping elbows straight, bring arms up and over head, hands toward floor. Keep pull steady on band. Hold momentarily. Return slowly, keeping pull steady, back to start. Then do same with a wider grip on the band (past shoulder width) Repeat _5-10__ times. Band color __yellow____   Side Pull: Double Arm   On back, knees bent, feet flat. Arms perpendicular to body, shoulder level, elbows straight but relaxed. Pull arms out to sides, elbows straight. Resistance band comes across collarbones, hands toward floor. Hold momentarily. Slowly return to starting position. Repeat _5-10__ times. Band color _yellow____   Sword   On back, knees bent, feet flat, left hand on left hip, right hand above left. Pull right arm DIAGONALLY (hip to shoulder) across chest. Bring right arm along head toward floor. Hold momentarily. Slowly return to starting position. Repeat _5-10__ times. Do with left arm. Band color _yellow_____   Shoulder Rotation: Double Arm   On back, knees bent, feet flat, elbows tucked at sides, bent 90, hands palms up. Pull hands apart and down toward floor, keeping elbows near sides. Hold momentarily. Slowly return to starting position. Repeat _5-10__ times. Band color __yellow____   Manual Lymph Drainage for Right Breast.  Do daily.  Do slowly. Use flat hands with just enough pressure to stretch the skin. Do not slide over the skin, but move the skin with the hand you're using. Lie down or sit comfortably (in a recliner, for example) to do this.  Hug yourself:  cross arms and do circles at collar bones near neck 5-7 times (to "wake up" lots of lymph nodes in this area). Take slow deep breaths, allowing your belly to balloon out as your breathe in, 5x (to "wake up" abdominal lymph nodes to take on extra fluid). Left  armpit--stretch skin in small circles to stimulate intact lymph nodes there, 5-7x. Right groin area, at panty line--stretch skin in small circles to stimulate lymph nodes 5-7x. Redirect fluid from right chest toward left armpit (stretch skin starting at right chest in 3-4 spots working toward left armpit) 3-4x across the chest. Redirect fluid from right armpit toward right groin (cup your hand around the curve of your right side and do 3-4 "pumps" from armpit to groin) 3-4x down your side. Draw an imaginary diagonal line from upper outer breast through the nipple area toward lower inner breast.  Direct fluid upward and inward from this line toward the pathway across your upper chest (established in #5).  Do this in three rows to treat all of the upper inner breast tissue, and do each row 3-4x. Then repeat #5 above. From the imaginary diagonal, direct fluid in three rows (to treat all of lower outer breast tissue) downward and outward toward pathway established in #6 that is aimed at the right groin.  Then repeat #6 above.  End with repeating #3 and #4 above. (Pt will only perform #1,2, right armpit, 4 ,6, right armpit, and 6 to address lateral trunk swelling on right  [facility] [address] [place]   [address] [phone]

## 2020-10-27 ENCOUNTER — Ambulatory Visit: Payer: BC Managed Care – PPO

## 2020-10-27 ENCOUNTER — Other Ambulatory Visit: Payer: Self-pay

## 2020-10-27 DIAGNOSIS — Z170 Estrogen receptor positive status [ER+]: Secondary | ICD-10-CM

## 2020-10-27 DIAGNOSIS — R293 Abnormal posture: Secondary | ICD-10-CM

## 2020-10-27 DIAGNOSIS — M25511 Pain in right shoulder: Secondary | ICD-10-CM | POA: Diagnosis not present

## 2020-10-27 DIAGNOSIS — M25611 Stiffness of right shoulder, not elsewhere classified: Secondary | ICD-10-CM | POA: Diagnosis not present

## 2020-10-27 DIAGNOSIS — C50411 Malignant neoplasm of upper-outer quadrant of right female breast: Secondary | ICD-10-CM

## 2020-10-27 DIAGNOSIS — C50811 Malignant neoplasm of overlapping sites of right female breast: Secondary | ICD-10-CM

## 2020-10-27 DIAGNOSIS — Z483 Aftercare following surgery for neoplasm: Secondary | ICD-10-CM

## 2020-10-27 NOTE — Therapy (Signed)
[place], [place], [address] Phone: [phone]   Fax:  [phone]  Physical Therapy Treatment  Patient Details  Name: Gail Erickson MRN: [id number] Date of Birth: 11/16/1975 Referring Provider (PT): Dr. Marlou Starks   Encounter Date: 10/27/2020   PT End of Session - 10/27/20 1202     Visit Number 20    Number of Visits 25    Date for PT Re-Evaluation 11/16/20    PT Start Time 1100    PT Stop Time 1200    PT Time Calculation (min) 60 min    Activity Tolerance Patient tolerated treatment well    Behavior During Therapy [facility] for tasks assessed/performed             Past Medical History:  Diagnosis Date   Abnormal Pap smear of cervix 06-02-14   LGSIL:neg HR HPV (1st abn. 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   Migraine without aura    Pre-diabetes    Prediabetes 2021   sees Dr.Balan   Vitamin D deficiency 05/21/15   level = 10    Past Surgical History:  Procedure Laterality Date   BREAST RECONSTRUCTION WITH PLACEMENT OF TISSUE EXPANDER AND FLEX HD (ACELLULAR HYDRATED DERMIS) Right 04/28/2020   Procedure: IMMEDIATE RIGHT BREAST RECONSTRUCTION WITH PLACEMENT OF TISSUE EXPANDER AND FLEX HD (ACELLULAR HYDRATED DERMIS);  Surgeon: Wallace Going, DO;  Location: [place];  Service: Plastics;  Laterality: Right;   COLPOSCOPY     MASTECTOMY W/ SENTINEL NODE BIOPSY Right 04/28/2020   Procedure: RIGHT MASTECTOMY WITH SENTINEL LYMPH NODE BIOPSY;  Surgeon: [name], MD;  Location: [place];  Service: General;  Laterality: Right;  PECTORAL BLOCK   MASTOPEXY Left 09/23/2020   Procedure: LEFT BREAST MASTOPEXY;  Surgeon: Wallace Going, DO;  Location: [place];  Service: Plastics;   Laterality: Left;   REMOVAL OF TISSUE EXPANDER AND PLACEMENT OF IMPLANT Right 09/23/2020   Procedure: REMOVAL OF TISSUE EXPANDER AND PLACEMENT OF IMPLANT;  Surgeon: Wallace Going, DO;  Location: [place];  Service: Plastics;  Laterality: Right;  90 min   UMBILICAL HERNIA REPAIR     --as a child    There were no vitals filed for this visit.   Subjective Assessment - 10/27/20 1101     Subjective Did OK after last session.  Did the MLD and used the band at home. Had a little discomfort earlier this am on the left in general.  The nipple is still very sensitive and the incision seems to be peeling some.    Pertinent History December 6th learned of Cancer from a biopsy done on Dec 3.  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 Did gene testing today, and having a biopsy on left breast tomorrow. Presently planning for right breast mastectomy but awaiting gene testing results. Pt had right mastectomy with SLNB and tissue expanders on 04/28/2020. I am able to sleep on my right arm longer now, but sometimes my arm feels heavy/tingling when I wake up but it resolves quickly. Started back to the exercises last week. Not presently working.    Patient Stated Goals Reassess right UE post surgery.    Currently in Pain?  No/denies    Pain Score 0-No pain                               [facility] Adult PT Treatment/Exercise - 10/27/20 0001       Shoulder Exercises: Standing   Other Standing Exercises jobes flexion, scaption, abd  x 10      Shoulder Exercises: Therapy Ball   Flexion Both;10 reps    ABduction Right;Left;5 reps      Manual Therapy   Soft tissue mobilization to the pectoralis, and lats and in SL to lats,lateral border and interscapular area.    Manual Lymphatic Drainage (MLD) reviewed pt performance of  MLD to right lateral trunk, Short neck, right axillary and inguinal LN's, Right axillo-inguinal pathway repeated multiple times retracing steps.    Passive ROM to the Rt  shoulder into flexion, abduction, ER   vcs for relaxation                         PT Long Term Goals - 10/19/20 1209       PT LONG TERM GOAL #1   Title Pt will be independent with HEP to increase right shoulder ROM and strength    Time 6    Period Weeks    Status Achieved      PT LONG TERM GOAL #2   Title Pt will have right shoulder flexion and abduction within 10 degrees of baseline    Baseline had achieved but since most recent surgery not quite achieved    Time 4    Period Weeks    Status On-going    Target Date 11/16/20      PT LONG TERM GOAL #3   Title Pts quick dash will be no greater than 10%    Time 4    Period Weeks    Status On-going    Target Date 11/16/20      PT LONG TERM GOAL #4   Title Pt will be fit for compression garments prn (sleeve,bra)    Time 4    Period Weeks    Status On-going      PT LONG TERM GOAL #5   Title Pt will have decreased pain/tightness by greater than 50%    Time 4    Period Weeks    Status On-going    Target Date 11/16/20                   Plan - 10/27/20 1203     Clinical Impression Statement Continued soft tissue mobilization to right upper quadrant and PROM to right shoulder and reviewed Self MLD to right trunk area.  Pt did very well with MLD requiring only ocasional TC's and VC's. Her incisions were checked with both sides healing well. A few stitches still visible.  progressed pt to ball rolls on wall and standing jobes activities.  Left arm became trembly with jobes activities greater than right    Personal Factors and Comorbidities Comorbidity 1    Comorbidities Right breast CA with implant on right, mastopexy on left    Examination-Activity Limitations Reach Overhead;Sleep;Dressing    Examination-Participation Restrictions Occupation    Stability/Clinical Decision Making Stable/Uncomplicated    Rehab Potential Excellent    PT Frequency 2x / week    PT Duration 4 weeks    PT  Treatment/Interventions ADLs/Self Care Home Management;Therapeutic exercise;Manual techniques;Patient/family education;Manual lymph drainage;Scar mobilization;Passive  range of motion    PT Next Visit Plan STM to pecs,lats, lateral trunk, scapular area, AAROM/PROM, assess incisions again, review pt MLD to right trunk prn,return to gentle strength    PT Home Exercise Plan 4 post op exercises, supine wand flex/scaption, corner stretch, standing band(SR, shoulder extension, bilateral ER)    Consulted and Agree with Plan of Care Patient             Patient will benefit from skilled therapeutic intervention in order to improve the following deficits and impairments:  Decreased activity tolerance, Decreased knowledge of precautions, Decreased range of motion, Decreased strength, Decreased scar mobility, Increased edema, Impaired sensation, Postural dysfunction, Impaired UE functional use, Pain  Visit Diagnosis: Malignant neoplasm of overlapping sites of right breast (HCC)  Stiffness of right shoulder, not elsewhere classified  Abnormal posture  Aftercare following surgery for neoplasm  Malignant neoplasm of upper-outer quadrant of right breast in female, estrogen receptor positive ([place])     Problem List Patient Active Problem List   Diagnosis Date Noted   Acquired absence of breast 10/01/2020   Encounter to establish care 09/09/2020   Screening due 09/09/2020   HLD (hyperlipidemia) 09/09/2020   Vitamin D deficiency 09/09/2020   Genetic testing 03/08/2020   Malignant neoplasm of overlapping sites of right breast ([place]) 02/25/2020   Family history of breast cancer     Claris Pong 10/27/2020, 12:09 PM  [place], [place], [address] Phone: [phone]   Fax:  [phone]  Name: Gail Erickson MRN: [id number] Date of Birth: 1975-04-27

## 2020-11-02 ENCOUNTER — Ambulatory Visit: Payer: BC Managed Care – PPO

## 2020-11-02 ENCOUNTER — Other Ambulatory Visit: Payer: Self-pay

## 2020-11-02 DIAGNOSIS — Z170 Estrogen receptor positive status [ER+]: Secondary | ICD-10-CM

## 2020-11-02 DIAGNOSIS — Z483 Aftercare following surgery for neoplasm: Secondary | ICD-10-CM | POA: Diagnosis not present

## 2020-11-02 DIAGNOSIS — R293 Abnormal posture: Secondary | ICD-10-CM

## 2020-11-02 DIAGNOSIS — C50411 Malignant neoplasm of upper-outer quadrant of right female breast: Secondary | ICD-10-CM | POA: Diagnosis not present

## 2020-11-02 DIAGNOSIS — M25511 Pain in right shoulder: Secondary | ICD-10-CM | POA: Diagnosis not present

## 2020-11-02 DIAGNOSIS — M25611 Stiffness of right shoulder, not elsewhere classified: Secondary | ICD-10-CM | POA: Diagnosis not present

## 2020-11-02 DIAGNOSIS — C50811 Malignant neoplasm of overlapping sites of right female breast: Secondary | ICD-10-CM

## 2020-11-02 NOTE — Therapy (Signed)
[place], [place], [address] Phone: [phone]   Fax:  [phone]  Physical Therapy Treatment  Patient Details  Name: Gail Erickson MRN: [id number] Date of Birth: 08/23/1975 Referring Provider (PT): Dr. Marlou Starks   Encounter Date: 11/02/2020   PT End of Session - 11/02/20 1411     Visit Number 21    Number of Visits 25    Date for PT Re-Evaluation 11/16/20    PT Start Time 1302    PT Stop Time [id number]    PT Time Calculation (min) 53 min    Activity Tolerance Patient tolerated treatment well    Behavior During Therapy [facility] for tasks assessed/performed             Past Medical History:  Diagnosis Date   Abnormal Pap smear of cervix 06-02-14   LGSIL:neg HR HPV (1st abn. 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   Migraine without aura    Pre-diabetes    Prediabetes 2021   sees Dr.Balan   Vitamin D deficiency 05/21/15   level = 10    Past Surgical History:  Procedure Laterality Date   BREAST RECONSTRUCTION WITH PLACEMENT OF TISSUE EXPANDER AND FLEX HD (ACELLULAR HYDRATED DERMIS) Right 04/28/2020   Procedure: IMMEDIATE RIGHT BREAST RECONSTRUCTION WITH PLACEMENT OF TISSUE EXPANDER AND FLEX HD (ACELLULAR HYDRATED DERMIS);  Surgeon: Wallace Going, DO;  Location: [place];  Service: Plastics;  Laterality: Right;   COLPOSCOPY     MASTECTOMY W/ SENTINEL NODE BIOPSY Right 04/28/2020   Procedure: RIGHT MASTECTOMY WITH SENTINEL LYMPH NODE BIOPSY;  Surgeon: [name], MD;  Location: [place];  Service: General;  Laterality: Right;  PECTORAL BLOCK   MASTOPEXY Left 09/23/2020   Procedure: LEFT BREAST MASTOPEXY;  Surgeon: Wallace Going, DO;  Location: [place];  Service: Plastics;   Laterality: Left;   REMOVAL OF TISSUE EXPANDER AND PLACEMENT OF IMPLANT Right 09/23/2020   Procedure: REMOVAL OF TISSUE EXPANDER AND PLACEMENT OF IMPLANT;  Surgeon: Wallace Going, DO;  Location: [place];  Service: Plastics;  Laterality: Right;  90 min   UMBILICAL HERNIA REPAIR     --as a child    There were no vitals filed for this visit.   Subjective Assessment - 11/02/20 1400     Subjective Things are OK.  I have been doing the MLD and it did feel better to me. The left nipple sensititivity is doing better, and the left tissue is still sore. Over the weekend I had some discomfort in the right chest. No present pain and I haven't taken any tylenol today.    Pertinent History December 6th learned of Cancer from a biopsy done on Dec 3.  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 Did gene testing today, and having a biopsy on left breast tomorrow. Presently planning for right breast mastectomy but awaiting gene testing results. Pt had right mastectomy with SLNB and tissue expanders on 04/28/2020. I am able to sleep on my right arm longer now, but sometimes my arm feels heavy/tingling when I wake up but it resolves quickly. Started back to the exercises last week. Not presently working.    Patient Stated Goals Reassess  right UE post surgery.    Currently in Pain? No/denies    Pain Score 0-No pain                               [facility] Adult PT Treatment/Exercise - 11/02/20 0001       Shoulder Exercises: Standing   External Rotation Strengthening;Both;10 reps    Theraband Level (Shoulder External Rotation) Level 1 (Yellow)    Extension Strengthening;Both;10 reps    Theraband Level (Shoulder Extension) Level 1 (Yellow)    Retraction Strengthening;Both;10 reps    Theraband Level (Shoulder Retraction) Level 1 (Yellow)    Other Standing Exercises Jobes flex and scaption x 12 0#      Shoulder Exercises: Pulleys   Flexion 2 minutes    Scaption 2 minutes      Shoulder  Exercises: Therapy Ball   Flexion Both;10 reps    ABduction Right;Left;5 reps      Manual Therapy   Soft tissue mobilization to the right  pectoralis, and lats and in SL to lats,lateral border and interscapular area.    Passive ROM to the Rt shoulder into flexion, abduction, ER   vcs for relaxation                         PT Long Term Goals - 10/19/20 1209       PT LONG TERM GOAL #1   Title Pt will be independent with HEP to increase right shoulder ROM and strength    Time 6    Period Weeks    Status Achieved      PT LONG TERM GOAL #2   Title Pt will have right shoulder flexion and abduction within 10 degrees of baseline    Baseline had achieved but since most recent surgery not quite achieved    Time 4    Period Weeks    Status On-going    Target Date 11/16/20      PT LONG TERM GOAL #3   Title Pts quick dash will be no greater than 10%    Time 4    Period Weeks    Status On-going    Target Date 11/16/20      PT LONG TERM GOAL #4   Title Pt will be fit for compression garments prn (sleeve,bra)    Time 4    Period Weeks    Status On-going      PT LONG TERM GOAL #5   Title Pt will have decreased pain/tightness by greater than 50%    Time 4    Period Weeks    Status On-going    Target Date 11/16/20                   Plan - 11/02/20 1416     Clinical Impression Statement Progressed bilateral shoulder ROM and strength today, and performed Soft tissue work and PROM after exercises.  Pt is progressing well with ROM, but has noted fatigue especially with jobes exercises greatest on the left.  Continued tightness in right pecs, lats and scapular area.  Pt has been able to decrease use of pain meds.    Personal Factors and Comorbidities Comorbidity 1    Comorbidities Right breast CA with implant on right, mastopexy on left    Examination-Activity Limitations Reach Overhead;Sleep;Dressing    Examination-Participation Restrictions Occupation     Stability/Clinical Decision Making Stable/Uncomplicated  Rehab Potential Excellent    PT Frequency 2x / week    PT Duration 4 weeks    PT Treatment/Interventions ADLs/Self Care Home Management;Therapeutic exercise;Manual techniques;Patient/family education;Manual lymph drainage;Scar mobilization;Passive range of motion    PT Next Visit Plan STM to pecs,lats, lateral trunk, scapular area, AAROM/PROM, assess incisions again, review pt MLD to right trunk prn,return to gentle strength    PT Home Exercise Plan 4 post op exercises, supine wand flex/scaption, corner stretch, standing band(SR, shoulder extension, bilateral ER)    Consulted and Agree with Plan of Care Patient             Patient will benefit from skilled therapeutic intervention in order to improve the following deficits and impairments:  Decreased activity tolerance, Decreased knowledge of precautions, Decreased range of motion, Decreased strength, Decreased scar mobility, Increased edema, Impaired sensation, Postural dysfunction, Impaired UE functional use, Pain  Visit Diagnosis: Malignant neoplasm of overlapping sites of right breast (HCC)  Stiffness of right shoulder, not elsewhere classified  Abnormal posture  Aftercare following surgery for neoplasm  Malignant neoplasm of upper-outer quadrant of right breast in female, estrogen receptor positive ([place])  Acute pain of right shoulder  Estrogen receptor positive     Problem List Patient Active Problem List   Diagnosis Date Noted   Acquired absence of breast 10/01/2020   Encounter to establish care 09/09/2020   Screening due 09/09/2020   HLD (hyperlipidemia) 09/09/2020   Vitamin D deficiency 09/09/2020   Genetic testing 03/08/2020   Malignant neoplasm of overlapping sites of right breast ([place]) 02/25/2020   Family history of breast cancer     Claris Pong 11/02/2020, 3:01 PM  [place], [place], [address] Phone: [phone]   Fax:  [phone]  Name: NOLENE STAHL MRN: [id number] Date of Birth: January 31, 1976  Cheral Almas, PT 11/02/20 3:01 PM

## 2020-11-04 ENCOUNTER — Encounter: Payer: Self-pay | Admitting: Nurse Practitioner

## 2020-11-04 ENCOUNTER — Ambulatory Visit: Payer: BC Managed Care – PPO

## 2020-11-04 ENCOUNTER — Inpatient Hospital Stay: Payer: BC Managed Care – PPO | Attending: Hematology | Admitting: Nurse Practitioner

## 2020-11-04 ENCOUNTER — Other Ambulatory Visit: Payer: Self-pay

## 2020-11-04 VITALS — BP 131/76 | HR 73 | Temp 98.8°F | Resp 16 | Ht 69.0 in | Wt 241.0 lb

## 2020-11-04 DIAGNOSIS — Z483 Aftercare following surgery for neoplasm: Secondary | ICD-10-CM | POA: Diagnosis not present

## 2020-11-04 DIAGNOSIS — Z79810 Long term (current) use of selective estrogen receptor modulators (SERMs): Secondary | ICD-10-CM | POA: Insufficient documentation

## 2020-11-04 DIAGNOSIS — Z170 Estrogen receptor positive status [ER+]: Secondary | ICD-10-CM

## 2020-11-04 DIAGNOSIS — R293 Abnormal posture: Secondary | ICD-10-CM

## 2020-11-04 DIAGNOSIS — C50811 Malignant neoplasm of overlapping sites of right female breast: Secondary | ICD-10-CM

## 2020-11-04 DIAGNOSIS — M25611 Stiffness of right shoulder, not elsewhere classified: Secondary | ICD-10-CM | POA: Diagnosis not present

## 2020-11-04 DIAGNOSIS — M25511 Pain in right shoulder: Secondary | ICD-10-CM

## 2020-11-04 DIAGNOSIS — F4320 Adjustment disorder, unspecified: Secondary | ICD-10-CM | POA: Diagnosis not present

## 2020-11-04 DIAGNOSIS — C50411 Malignant neoplasm of upper-outer quadrant of right female breast: Secondary | ICD-10-CM | POA: Diagnosis not present

## 2020-11-04 NOTE — Progress Notes (Signed)
CLINIC:  Survivorship   Patient Care Team: Noreene Larsson, NP as PCP - General (Nurse Practitioner) [name], MD as Consulting Physician (Obstetrics and Gynecology) [name], MD as Consulting Physician (General Surgery) [name], MD as Consulting Physician (Hematology) [name], RN as Oncology Nurse Navigator [name], RN as Oncology Nurse Navigator Alla Feeling, NP as Nurse Practitioner (Nurse Practitioner)    REASON FOR VISIT:  Routine follow-up post-treatment for a recent history of breast cancer.  BRIEF ONCOLOGIC HISTORY:  Oncology History Overview Note  Cancer Staging Malignant neoplasm of overlapping sites of right breast [facility]) Staging form: Breast, AJCC 8th Edition - Clinical stage from 02/25/2020: Stage IIB (cT3, cN0, cM0, G3, ER+, PR+, HER2-) - Signed by [name], MD on 02/25/2020 - Pathologic stage from 04/28/2020: Stage IA (pT1a, pN0, cM0, G2, ER+, PR+, HER2-) - Signed by [name], MD on 05/06/2020 Histologic grading system: 3 grade system    Malignant neoplasm of overlapping sites of right breast ([place])  01/30/2020 Mammogram   Mammogram 01/30/20  IMPRESSION: 1. 7.3 x 5.7 x 3.0 cm area of indeterminate calcifications in medial right breast, involving the lower inner and upper inner quadrants, these extend from the retroareolar region posteriorly, in a segmental distribution, suspicious for DCIS. 2. Two probably benign left breast masses and 2 probably benign left axillary lymph nodes with borderline cortical thickening. 3. Benign right breast cyst.   02/13/2020 Initial Biopsy   Diagnosis 02/13/20 1. Breast, right, needle core biopsy, posterior extent - DUCTAL CARCINOMA IN SITU WITH CALCIFICATIONS AND NECROSIS - SEE COMMENT 2. Breast, right, needle core biopsy, mid-anterior extent - INVASIVE DUCTAL CARCINOMA - DUCTAL CARCINOMA IN SITU - SEE COMMENT Microscopic Comment 1. Based on the biopsy, the ductal carcinoma in  situ has a solid pattern, high nuclear grade and measures 0.5 cm in greatest linear extent. Prognostic markers (ER/PR) are pending and will be reported in an addendum. Dr. Jeannie Done reviewed the case and agrees with the above diagnosis. These results were called to [facility] on February 16, 2020. 2. Based on the biopsy, the carcinoma appears Nottingham grade 3 of 3 and measures 0.15 cm in greatest linear extent. Prognostic markers (ER/PR/ki-67/HER2) are pending and will be reported in an addendum.   02/13/2020 Receptors her2    1. PROGNOSTIC INDICATORS Results: IMMUNOHISTOCHEMICAL AND MORPHOMETRIC ANALYSIS PERFORMED MANUALLY Estrogen Receptor: >95%, POSITIVE, MODERATE-STRONG STAINING INTENSITY Progesterone Receptor: 5%, POSITIVE, MODERATE-STRONG STAINING INTENSITY  2. PROGNOSTIC INDICATORS Results: IMMUNOHISTOCHEMICAL AND MORPHOMETRIC ANALYSIS PERFORMED MANUALLY The tumor cells are NEGATIVE for Her2 (0). Estrogen Receptor: >95%, POSITIVE, STRONG STAINING INTENSITY Progesterone Receptor: 60%, POSITIVE, STRONG STAINING INTENSITY Proliferation Marker Ki67: 1%   02/21/2020 Breast MRI   Breast MRI 02/21/20  IMPRESSION: 1. Involving the sites of biopsy proven DCIS in the medial right breast there is a large area of ill-defined non mass enhancement middle to anterior depth, somewhat difficult to measure due to irregular margins and background enhancement but measures at least 6.3 x 3.3 x 2.0 cm. 2. No MRI evidence of malignancy in the left breast. 3. No axillary adenopathy.   02/25/2020 Initial Diagnosis   Malignant neoplasm of overlapping sites of right breast ([place])   02/25/2020 Cancer Staging   Staging form: Breast, AJCC 8th Edition - Clinical stage from 02/25/2020: Stage IIB (cT3, cN0, cM0, G3, ER+, PR+, HER2-) - Signed by [name], MD on 02/25/2020   02/27/2020 Pathology Results   Diagnosis Lymph node, needle/core  biopsy, axillary - NO CARCINOMA IDENTIFIED  IN NODAL TISSUE Microscopic Comment These results were called to [facility] on March 09, 2020.   04/28/2020 Surgery   RIGHT MASTECTOMY WITH SENTINEL LYMPH NODE BIOPSY by DR Marlou Starks  IMMEDIATE RIGHT BREAST RECONSTRUCTION WITH PLACEMENT OF TISSUE EXPANDER AND FLEX HD (ACELLULAR HYDRATED DERMIS) by Dr Marla Roe   04/28/2020 Pathology Results   FINAL MICROSCOPIC DIAGNOSIS:   A. 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 LYMPH NODE, RIGHT AXILLARY, SENTINEL, EXCISION:  -  No carcinoma identified in one lymph node (0/1)    04/28/2020 Cancer Staging   Staging form: Breast, AJCC 8th Edition - Pathologic stage from 04/28/2020: Stage IA (pT1a, pN0, cM0, G2, ER+, PR+, HER2-) - Signed by [name], MD on 05/06/2020 Histologic grading system: 3 grade system   07/10/2020 -  Anti-estrogen oral therapy   Tamoxifen $RemoveBe'20mg'orPkwzyiC$  once daily starting 07/10/20   11/04/2020 Survivorship   SCP delivered by [name], NP     INTERVAL HISTORY:  Ms. Bedingfield presents to the [facility] today for our initial meeting to review her survivorship care plan detailing her treatment course for breast cancer, as well as monitoring long-term side effects of that treatment, education regarding health maintenance, screening, and overall wellness and health promotion.     Overall she is doing well.  Working with PT with right outer breast soreness.  She was off tamoxifen 2 weeks pre and 3 weeks post reconstruction surgery, she enjoyed the way she felt off antiestrogen therapy.  Now that she has been back on it she feels more bloated.  She has joint aches in her knees and right ankle, not limiting activity or exercise.  She is walking daily.   This only bothers her when she is walking up stairs.  Hot flashes have improved.  She is watching her weight and p.o. intake.  She is interested in reconnecting with nutrition/dietitian to decrease her cardiovascular risk on antiestrogen therapy.  She is having normal menstrual cycle, started 11/01/2020.   ONCOLOGY TREATMENT TEAM:  1. Surgeon:  Dr. Marlou Starks at [facility] 2. Medical Oncologist: Dr. Burr Medico     PAST MEDICAL/SURGICAL HISTORY:  Past Medical History:  Diagnosis Date   Abnormal Pap smear of cervix 06-02-14   LGSIL:neg HR HPV (1st abn. 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   Migraine without aura    Pre-diabetes    Prediabetes 2021   sees Dr.Balan   Vitamin D deficiency 05/21/15   level = 10   Past Surgical History:  Procedure Laterality Date   BREAST RECONSTRUCTION WITH PLACEMENT OF TISSUE EXPANDER AND FLEX HD (ACELLULAR HYDRATED DERMIS) Right 04/28/2020   Procedure: IMMEDIATE RIGHT BREAST RECONSTRUCTION WITH PLACEMENT OF TISSUE EXPANDER AND FLEX HD (ACELLULAR HYDRATED DERMIS);  Surgeon: Wallace Going, DO;  Location: [place];  Service: Plastics;  Laterality: Right;   COLPOSCOPY     MASTECTOMY W/  SENTINEL NODE BIOPSY Right 04/28/2020   Procedure: RIGHT MASTECTOMY WITH SENTINEL LYMPH NODE BIOPSY;  Surgeon: [name], MD;  Location: [place];  Service: General;  Laterality: Right;  PECTORAL BLOCK   MASTOPEXY Left 09/23/2020   Procedure: LEFT BREAST MASTOPEXY;  Surgeon: Wallace Going, DO;  Location: [place];  Service: Plastics;  Laterality: Left;   REMOVAL OF TISSUE EXPANDER AND PLACEMENT OF IMPLANT Right 09/23/2020   Procedure: REMOVAL OF TISSUE EXPANDER AND PLACEMENT OF IMPLANT;  Surgeon: Wallace Going, DO;  Location: [place];  Service: Plastics;  Laterality: Right;  90 min   UMBILICAL HERNIA REPAIR     --as a child     ALLERGIES:  No Known Allergies   CURRENT MEDICATIONS:  Outpatient Encounter Medications as of 11/04/2020  Medication Sig   Ascorbic Acid (VITAMIN C) 1000 MG tablet Take 1,000 mg by mouth daily.   Aspirin-Acetaminophen-Caffeine (EXCEDRIN MIGRAINE PO) Take 1 tablet by mouth as needed.   ibuprofen (ADVIL) 600 MG tablet Take 1 tablet (600 mg total) by mouth every 8 (eight) hours as needed for moderate pain. For use AFTER surgery   Multiple Vitamin (MULTIVITAMIN) tablet Take 1 tablet by mouth daily.   Omega-3 Fatty Acids (FISH OIL) 1000 MG CAPS Take by mouth.   Probiotic Product (PROBIOTIC-10 PO) Take 1 tablet by mouth.   tamoxifen (NOLVADEX) 20 MG tablet TAKE 1 TABLET BY MOUTH  DAILY   VITAMIN D PO Take 1,000 Int'l Units by mouth.   No facility-administered encounter medications on file as of 11/04/2020.     ONCOLOGIC FAMILY HISTORY:  Family History  Problem Relation Age of Onset   Breast cancer Mother 46   Diabetes Mother    Hypertension Mother    Hyperlipidemia Mother    Thyroid disease Mother    Transient ischemic attack Mother    Breast cancer Paternal Aunt 55       bil. Breast ca--deceased CHF   Breast cancer Cousin 45       maternal first cousin died Breast ca age 54   Hyperlipidemia Father    HIV/AIDS Maternal Uncle    Cancer Paternal Uncle        NOS - needed BMT   Cancer Maternal Grandmother 82       adrenal gland cancer   Breast cancer Paternal Grandmother        dx in her mid to late 77s   Aneurysm Maternal Aunt 26       Brain   Kidney disease Maternal Uncle    Cancer Other        2 maternal great uncles with cancer NOS   Breast cancer Maternal Aunt 72       Breast cancer   Breast cancer Maternal Aunt 72     GENETIC COUNSELING/TESTING: Yes, negative 02/26/2020  SOCIAL HISTORY:  JURNEE NAKAYAMA lives in [place].  Ms. Moeser is currently  working but out on leave.  She denies any current or history of tobacco, alcohol, or illicit drug use.     PHYSICAL EXAMINATION:  Vital Signs:   Vitals:   11/04/20 1232  BP: 131/76  Pulse: 73  Resp: 16  Temp: 98.8 F (37.1 C)  SpO2: 100%   Filed Weights   11/04/20 1232  Weight: 241 lb (109.3 kg)   General: Well-nourished, well-appearing female in no acute distress.   HEENT: Sclerae anicteric.  Lymph: No cervical, supraclavicular, or infraclavicular lymphadenopathy noted on  palpation.  Respiratory:  breathing non-labored.  Neuro: No focal deficits. Steady gait.  Psych: Mood and affect normal and appropriate for situation.  Extremities: No edema. MSK: No focal spinal tenderness to palpation.  Full range of motion in bilateral upper extremities Skin: Warm and dry. Breast exam: S/p right mastectomy and reconstruction and left breast reduction, incisions completely healed.  Left nipple without discharge or inversion.  No palpable mass or nodularity in either breast or axilla that I could appreciate.  LABORATORY DATA:  None for this visit.  DIAGNOSTIC IMAGING:  None for this visit.      ASSESSMENT AND PLAN:  Ms.. Gail Erickson is a pleasant 45 y.o. female with Stage 1A right breast invasive ductal carcinoma, ER+/PR+/HER2-, diagnosed in (date), treated with lumpectomy, adjuvant radiation therapy, and anti-estrogen therapy with tamoxifen beginning in 06/2020.  She presents to the Survivorship Clinic for our initial meeting and routine follow-up post-completion of treatment for breast cancer.    1. Stage 1A right breast cancer:  Ms. Rauda is continuing to recover from definitive treatment for breast cancer. She will follow-up with her medical oncologist, Dr. Burr Medico in 3 months with history and physical exam per surveillance protocol.  She will continue her anti-estrogen therapy with tamoxifen.  Thus far, she is tolerating moderately well with tolerable hot flashes and joint aches in her knees  and right ankle.  She was instructed to make Dr. Burr Medico or myself aware if she begins to experience any worsening side effects of the medication and I could see her back in clinic to help manage those side effects, as needed. Though the incidence is low, there is an associated risk of endometrial cancer with anti-estrogen therapies like Tamoxifen.  Ms. Bradwell was encouraged to contact Dr. Burr Medico or myself with abnormal vaginal bleeding while taking Tamoxifen.  Still currently having monthly menstrual cycles.  Other side effects of Tamoxifen were again reviewed with her as well. Today, a comprehensive survivorship care plan and treatment summary was reviewed with the patient today detailing her breast cancer diagnosis, treatment course, potential late/long-term effects of treatment, appropriate follow-up care with recommendations for the future, and patient education resources.  A copy of this summary, along with a letter will be sent to the patient's primary care provider via In Basket message after today's visit.    2. Bone health:  Given Ms. Kirkes's premenopausal status and current therapy with tamoxifen, she does not need to have a bone density scan at this time. In the meantime, she was encouraged to begin daily calcium and vitamin D supplements as well as increase her weight-bearing activities.  She was given education on specific activities to promote bone health.  3. Cancer screening:  Due to Ms. Jay's history and her age, she should receive screening for skin cancers, colon cancer, and gynecologic cancers.  She was in the process of GI referral for first colonoscopy prior to her surgery but will revisit.  The information and recommendations are listed on the patient's comprehensive care plan/treatment summary and were reviewed in detail with the patient.    4. Health maintenance and wellness promotion: Ms. Fesperman was encouraged to consume 5-7 servings of fruits and vegetables per day. We reviewed the  "Nutrition Rainbow" handout. She was also encouraged to engage in moderate to vigorous exercise for 30 minutes per day most days of the week.  She was given written information on the live strong program at the [facility] for cancer survivors.  We will follow-up on the dietitian/nutrition referral.  She was instructed to limit her alcohol consumption and continue to abstain from tobacco use.     5. Support services/counseling: It is not uncommon for this period of the patient's cancer care trajectory to be one of many emotions and stressors.  We discussed an opportunity for her to participate in the next session of [facility] ("Finding Your New Normal") support group series designed for patients after they have completed treatment.   Ms. Matura was encouraged to take advantage of our many other support services programs, support groups, and/or counseling in coping with her new life as a cancer survivor after completing anti-cancer treatment.  She was offered support today through active listening and expressive supportive counseling.  She was given information regarding our available services and encouraged to contact me with any questions or for help enrolling in any of our support group/programs.    Dispo:   -Return to cancer center 02/11/21  -Mammogram due in 01/2021 -Follow up with surgery 11/2020 as scheduled -Reestablish with nutrition/dietitian -Proceed with colonoscopy this year -Recommend COVID booster and flu shot in the next month or so, separate 2 weeks apart -She is welcome to return back to the Survivorship Clinic at any time; no additional follow-up needed at this time.  -Consider referral back to survivorship as a long-term survivor for continued surveillance  Orders Placed This Encounter  Procedures   MM DIAG BREAST TOMO UNI LEFT    Standing Status:   Future    Standing Expiration Date:   11/04/2021    Order Specific Question:   Reason for Exam (SYMPTOM  OR DIAGNOSIS REQUIRED)    Answer:    Right breast cancer s/p mastectomy and reconstruction, left breast reduction    Order Specific Question:   Is the patient pregnant?    Answer:   No    Order Specific Question:   Preferred imaging location?    Answer:   [facility]     A total of (40) minutes of face-to-face time was spent with this patient with greater than 50% of that time in counseling and care-coordination.   [name], NP Survivorship Program [facility] [phone]   Note: PRIMARY CARE PROVIDER Noreene Larsson, [place] [phone]

## 2020-11-04 NOTE — Therapy (Signed)
[place], [place], [address] Phone: [phone]   Fax:  [phone]  Physical Therapy Treatment  Patient Details  Name: Gail Erickson MRN: [id number] Date of Birth: [birth year] Referring Provider (PT): Dr. Marlou Starks   Encounter Date: 11/04/2020   PT End of Session - 11/04/20 1202     Visit Number 22    Number of Visits 25    Date for PT Re-Evaluation 11/16/20    PT Start Time 1006    PT Stop Time 1100    PT Time Calculation (min) 54 min    Activity Tolerance Patient tolerated treatment well    Behavior During Therapy [facility] for tasks assessed/performed             Past Medical History:  Diagnosis Date   Abnormal Pap smear of cervix 06-02-14   LGSIL:neg HR HPV (1st abn. 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   Migraine without aura    Pre-diabetes    Prediabetes 2021   sees Dr.Balan   Vitamin D deficiency 05/21/15   level = 10    Past Surgical History:  Procedure Laterality Date   BREAST RECONSTRUCTION WITH PLACEMENT OF TISSUE EXPANDER AND FLEX HD (ACELLULAR HYDRATED DERMIS) Right 04/28/2020   Procedure: IMMEDIATE RIGHT BREAST RECONSTRUCTION WITH PLACEMENT OF TISSUE EXPANDER AND FLEX HD (ACELLULAR HYDRATED DERMIS);  Surgeon: Wallace Going, DO;  Location: [place];  Service: Plastics;  Laterality: Right;   COLPOSCOPY     MASTECTOMY W/ SENTINEL NODE BIOPSY Right 04/28/2020   Procedure: RIGHT MASTECTOMY WITH SENTINEL LYMPH NODE BIOPSY;  Surgeon: [name], MD;  Location: [place];  Service: General;  Laterality: Right;  PECTORAL BLOCK   MASTOPEXY Left 09/23/2020   Procedure: LEFT BREAST MASTOPEXY;  Surgeon: Wallace Going, DO;  Location: [place];  Service: Plastics;   Laterality: Left;   REMOVAL OF TISSUE EXPANDER AND PLACEMENT OF IMPLANT Right 09/23/2020   Procedure: REMOVAL OF TISSUE EXPANDER AND PLACEMENT OF IMPLANT;  Surgeon: Wallace Going, DO;  Location: [place];  Service: Plastics;  Laterality: Right;  90 min   UMBILICAL HERNIA REPAIR     --as a child    There were no vitals filed for this visit.   Subjective Assessment - 11/04/20 1005     Subjective Woke up today with alot of tenderness especially at the left lateral breast. Right side feels pretty good. I did fine last visit but I did take some medication later in the day.    Pertinent History December 6th learned of Cancer from a biopsy done on Dec 3.  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 Did gene testing today, and having a biopsy on left breast tomorrow. Presently planning for right breast mastectomy but awaiting gene testing results. Pt had right mastectomy with SLNB and tissue expanders on 04/28/2020. I am able to sleep on my right arm longer now, but sometimes my arm feels heavy/tingling when I wake up but it resolves quickly. Started back to the exercises last week. Not presently working.                [facility] PT Assessment - 11/04/20 0001  AROM   Right Shoulder Flexion 162 Degrees    Right Shoulder ABduction 165 Degrees    Left Shoulder Flexion 167 Degrees    Left Shoulder ABduction 171 Degrees                           [facility] Adult PT Treatment/Exercise - 11/04/20 0001       Shoulder Exercises: Standing   Other Standing Exercises Elbow flex and ext 2# x 10    Other Standing Exercises Shoulder stabs in standing Jpbes flex and scaption 1 # x 10 ea  Yellow ball on wall; up and down, side to side x 20 B     Shoulder Exercises: Pulleys   Flexion 2 minutes    Scaption 2 minutes    ABduction 1 minute      Shoulder Exercises: Therapy Ball   Flexion Both;10 reps    ABduction Both;5 reps      Manual Therapy   Manual Lymphatic Drainage (MLD)  Performed MLD to bilateral axillary and inguinal LN's and axilloinguinal pathways, and on left to left lateral breast. gentle scar mobs to left inferior incision                         PT Long Term Goals - 10/19/20 1209       PT LONG TERM GOAL #1   Title Pt will be independent with HEP to increase right shoulder ROM and strength    Time 6    Period Weeks    Status Achieved      PT LONG TERM GOAL #2   Title Pt will have right shoulder flexion and abduction within 10 degrees of baseline    Baseline had achieved but since most recent surgery not quite achieved    Time 4    Period Weeks    Status On-going    Target Date 11/16/20      PT LONG TERM GOAL #3   Title Pts quick dash will be no greater than 10%    Time 4    Period Weeks    Status On-going    Target Date 11/16/20      PT LONG TERM GOAL #4   Title Pt will be fit for compression garments prn (sleeve,bra)    Time 4    Period Weeks    Status On-going      PT LONG TERM GOAL #5   Title Pt will have decreased pain/tightness by greater than 50%    Time 4    Period Weeks    Status On-going    Target Date 11/16/20                   Plan - 11/04/20 1203     Clinical Impression Statement continued to progress bilateral shoulder/elbow ROM and strength, and performed MLD bilateral trunk and left breast after exercises.  Pts shoulder ROM was measured with very good improvement.  She progressed to light wts and low reps of selected exercises with mild fatigue only.  Showed her how she can use TBand for elbow strength. She continues to awaken with discomfort greatest in the left breast. There is no visualized swelling but since MLD does help her on the right side we tried this on the left today as well.    Personal Factors and Comorbidities Comorbidity 1    Comorbidities Right breast CA with implant on right, mastopexy on  left    Examination-Activity Limitations Reach Overhead;Sleep;Dressing     Examination-Participation Restrictions Occupation    Stability/Clinical Decision Making Stable/Uncomplicated    Rehab Potential Excellent    PT Frequency 2x / week    PT Duration 4 weeks    PT Treatment/Interventions ADLs/Self Care Home Management;Therapeutic exercise;Manual techniques;Patient/family education;Manual lymph drainage;Scar mobilization;Passive range of motion    PT Next Visit Plan ABC strength, review MLD prn, STM prn    PT Home Exercise Plan 4 post op exercises, supine wand flex/scaption, corner stretch, standing band(SR, shoulder extension, bilateral ER)    Consulted and Agree with Plan of Care Patient             Patient will benefit from skilled therapeutic intervention in order to improve the following deficits and impairments:  Decreased activity tolerance, Decreased knowledge of precautions, Decreased range of motion, Decreased strength, Decreased scar mobility, Increased edema, Impaired sensation, Postural dysfunction, Impaired UE functional use, Pain  Visit Diagnosis: Malignant neoplasm of overlapping sites of right breast ([facility])  Stiffness of right shoulder, not elsewhere classified  Abnormal posture  Aftercare following surgery for neoplasm  Malignant neoplasm of upper-outer quadrant of right breast in female, estrogen receptor positive ([place])  Acute pain of right shoulder  Estrogen receptor positive     Problem List Patient Active Problem List   Diagnosis Date Noted   Acquired absence of breast 10/01/2020   Encounter to establish care 09/09/2020   Screening due 09/09/2020   HLD (hyperlipidemia) 09/09/2020   Vitamin D deficiency 09/09/2020   Genetic testing 03/08/2020   Malignant neoplasm of overlapping sites of right breast ([place]) 02/25/2020   Family history of breast cancer     Claris Pong 11/04/2020, 12:08 PM  [place] [address] [place], [place], [address] Phone: [phone]    Fax:  [phone]  Name: DERRY SLAVEN MRN: [id number] Date of Birth: 29-Apr-1975  Cheral Almas, PT 11/04/20 12:10 PM

## 2020-11-16 DIAGNOSIS — F4320 Adjustment disorder, unspecified: Secondary | ICD-10-CM | POA: Diagnosis not present

## 2020-11-17 ENCOUNTER — Other Ambulatory Visit: Payer: Self-pay

## 2020-11-17 ENCOUNTER — Ambulatory Visit: Payer: BC Managed Care – PPO | Attending: General Surgery

## 2020-11-17 ENCOUNTER — Ambulatory Visit (INDEPENDENT_AMBULATORY_CARE_PROVIDER_SITE_OTHER): Payer: BC Managed Care – PPO | Admitting: Surgical

## 2020-11-17 DIAGNOSIS — Z483 Aftercare following surgery for neoplasm: Secondary | ICD-10-CM | POA: Diagnosis not present

## 2020-11-17 DIAGNOSIS — M25611 Stiffness of right shoulder, not elsewhere classified: Secondary | ICD-10-CM | POA: Diagnosis not present

## 2020-11-17 DIAGNOSIS — M25511 Pain in right shoulder: Secondary | ICD-10-CM | POA: Diagnosis not present

## 2020-11-17 DIAGNOSIS — Z170 Estrogen receptor positive status [ER+]: Secondary | ICD-10-CM | POA: Insufficient documentation

## 2020-11-17 DIAGNOSIS — C50811 Malignant neoplasm of overlapping sites of right female breast: Secondary | ICD-10-CM

## 2020-11-17 DIAGNOSIS — M25512 Pain in left shoulder: Secondary | ICD-10-CM | POA: Insufficient documentation

## 2020-11-17 DIAGNOSIS — R293 Abnormal posture: Secondary | ICD-10-CM

## 2020-11-17 DIAGNOSIS — Z9011 Acquired absence of right breast and nipple: Secondary | ICD-10-CM

## 2020-11-17 DIAGNOSIS — C50411 Malignant neoplasm of upper-outer quadrant of right female breast: Secondary | ICD-10-CM

## 2020-11-17 NOTE — Therapy (Signed)
[place], [place], [address] Phone: [phone]   Fax:  [phone]  Physical Therapy Treatment  Patient Details  Name: Gail Erickson MRN: [id number] Date of Birth: 12/24/1975 Referring Provider (PT): Dr. Marlou Starks   Encounter Date: 11/17/2020   PT End of Session - 11/17/20 0916     Visit Number 23    Number of Visits 27    Date for PT Re-Evaluation 12/15/20    PT Start Time 0905    PT Stop Time 0955    PT Time Calculation (min) 50 min    Activity Tolerance Patient tolerated treatment well    Behavior During Therapy [facility] for tasks assessed/performed             Past Medical History:  Diagnosis Date   Abnormal Pap smear of cervix 06-02-14   LGSIL:neg HR HPV (1st abn. 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   Migraine without aura    Pre-diabetes    Prediabetes 2021   sees Dr.Balan   Vitamin D deficiency 05/21/15   level = 10    Past Surgical History:  Procedure Laterality Date   BREAST RECONSTRUCTION WITH PLACEMENT OF TISSUE EXPANDER AND FLEX HD (ACELLULAR HYDRATED DERMIS) Right 04/28/2020   Procedure: IMMEDIATE RIGHT BREAST RECONSTRUCTION WITH PLACEMENT OF TISSUE EXPANDER AND FLEX HD (ACELLULAR HYDRATED DERMIS);  Surgeon: Wallace Going, DO;  Location: [place];  Service: Plastics;  Laterality: Right;   COLPOSCOPY     MASTECTOMY W/ SENTINEL NODE BIOPSY Right 04/28/2020   Procedure: RIGHT MASTECTOMY WITH SENTINEL LYMPH NODE BIOPSY;  Surgeon: [name], MD;  Location: [place];  Service: General;  Laterality: Right;  PECTORAL BLOCK   MASTOPEXY Left 09/23/2020   Procedure: LEFT BREAST MASTOPEXY;  Surgeon: Wallace Going, DO;  Location: [place];  Service: Plastics;   Laterality: Left;   REMOVAL OF TISSUE EXPANDER AND PLACEMENT OF IMPLANT Right 09/23/2020   Procedure: REMOVAL OF TISSUE EXPANDER AND PLACEMENT OF IMPLANT;  Surgeon: Wallace Going, DO;  Location: [place];  Service: Plastics;  Laterality: Right;  90 min   UMBILICAL HERNIA REPAIR     --as a child    There were no vitals filed for this visit.   Subjective Assessment - 11/17/20 0905     Subjective I go back to work on Monday. I overdid it last week and I got more discomfort and pain on the left side. I did some lymphatic drainage while on vacation.and did some stretches. The right side has been good.  I see the MD today. I go to try on the sleeve today.    Pertinent History December 6th learned of Cancer from a biopsy done on Dec 3.  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 Did gene testing today, and having a biopsy on left breast tomorrow. Presently planning for right breast mastectomy but awaiting gene testing results. Pt had right mastectomy with SLNB and tissue expanders on 04/28/2020. I am able to sleep on my right arm longer now, but sometimes my arm feels heavy/tingling when I wake up but it resolves quickly. Started back to the exercises last week. Not presently working.    Patient Stated  Goals Reassess right UE post surgery.    Currently in Pain? Yes    Pain Score 2     Pain Location Breast    Pain Orientation Left    Pain Descriptors / Indicators Aching    Pain Type Surgical pain    Pain Onset More than a month ago    Pain Frequency Intermittent                [facility] PT Assessment - 11/17/20 0001       Assessment   Medical Diagnosis S/p Right mastectomy    Referring Provider (PT) Dr. Marlou Starks    Onset Date/Surgical Date 04/28/20    Hand Dominance Right      AROM   Right Shoulder Flexion 163 Degrees (P)     Right Shoulder ABduction 165 Degrees (P)     Left Shoulder Flexion 170 Degrees (P)     Left Shoulder ABduction 165 Degrees (P)                             [facility] Adult PT Treatment/Exercise - 11/17/20 0001       Shoulder Exercises: Supine   Horizontal ABduction Strengthening;Both;10 reps    Theraband Level (Shoulder Horizontal ABduction) Level 1 (Yellow)    External Rotation Strengthening;Both;10 reps    Theraband Level (Shoulder External Rotation) Level 1 (Yellow)    Flexion Strengthening;Both;10 reps    Theraband Level (Shoulder Flexion) Level 1 (Yellow)    Diagonals Strengthening;Right;Left;10 reps    Theraband Level (Shoulder Diagonals) Level 1 (Yellow)      Shoulder Exercises: Pulleys   Flexion 2 minutes    ABduction 2 minutes      Manual Therapy   Soft tissue mobilization to the right  pectoralis, and lats and in SL to lats,lateral border and interscapular area.    Passive ROM to the Rt shoulder into flexion, abduction, ER, diagonals   vcs for relaxation                  Upper Extremity Functional Index Score :   /80        PT Long Term Goals - 11/17/20 0920       PT LONG TERM GOAL #1   Title Pt will be independent with HEP to increase right shoulder ROM and strength    Time 6    Period Weeks    Status Achieved      PT LONG TERM GOAL #2   Title Pt will have right shoulder flexion and abduction within 10 degrees of baseline    Baseline flexion achieved, abd not achieved 14 degrees from baseline today    Time 4    Period Weeks    Status On-going    Target Date 12/15/20      PT LONG TERM GOAL #3   Title Pts quick dash will be no greater than 10%    Time 4    Period Weeks    Status On-going    Target Date 12/15/20      PT LONG TERM GOAL #4   Title Pt will be fit for compression garments prn (sleeve,bra)    Baseline fit but not received    Time 4    Period Weeks    Status On-going    Target Date 12/15/20      PT LONG TERM GOAL #5   Title Pt will have decreased pain/tightness by greater than 50%  Baseline right yes,  left not yet    Time 4    Period Weeks     Status On-going    Target Date 12/15/20      Additional Long Term Goals   Additional Long Term Goals Yes      PT LONG TERM GOAL #6   Title Pt will be educated in [facility] strength handout for progression of independent strengthening    Time 4    Period Weeks    Status New    Target Date 12/15/20                   Plan - 11/17/20 0956     Clinical Impression Statement Pt has been doing quite well on the right side, but continues to have pain in the left breast after mastopexy surgery.  She will follow up with MD about this today.  She has been indepedent and compliant with her HEP.  She has not quite achieved her shoulder ROM goal and is still lacking 14 degrees of abduction on the right and 15 degrees on the left. She continues with right pectoral and latissimus tenderness/tightness. She will benefit from continued PT to progress pt to an independent After Breast Cancer full body strengthening program and to address remaining ROM deficits.    Personal Factors and Comorbidities Comorbidity 1    Comorbidities Right breast CA with implant on right, mastopexy on left    Examination-Activity Limitations Reach Overhead;Sleep;Dressing    Examination-Participation Restrictions Occupation    Stability/Clinical Decision Making Stable/Uncomplicated    Rehab Potential Excellent    PT Frequency 1x / week    PT Duration 4 weeks    PT Treatment/Interventions ADLs/Self Care Home Management;Therapeutic exercise;Manual techniques;Patient/family education;Manual lymph drainage;Scar mobilization;Passive range of motion    PT Next Visit Plan SOZO next,ABC strength, review MLD prn, STM prn    PT Home Exercise Plan 4 post op exercises, supine wand flex/scaption, corner stretch, standing band(SR, shoulder extension, bilateral ER)    Recommended Other Services getting sleeve today and will bring next visit    Consulted and Agree with Plan of Care Patient             Patient will benefit from skilled  therapeutic intervention in order to improve the following deficits and impairments:  Decreased activity tolerance, Decreased knowledge of precautions, Decreased range of motion, Decreased strength, Decreased scar mobility, Increased edema, Impaired sensation, Postural dysfunction, Impaired UE functional use, Pain  Visit Diagnosis: Malignant neoplasm of overlapping sites of right breast (HCC)  Stiffness of right shoulder, not elsewhere classified  Abnormal posture  Aftercare following surgery for neoplasm  Malignant neoplasm of upper-outer quadrant of right breast in female, estrogen receptor positive ([place])  Acute pain of right shoulder     Problem List Patient Active Problem List   Diagnosis Date Noted   Acquired absence of breast 10/01/2020   Encounter to establish care 09/09/2020   Screening due 09/09/2020   HLD (hyperlipidemia) 09/09/2020   Vitamin D deficiency 09/09/2020   Genetic testing 03/08/2020   Malignant neoplasm of overlapping sites of right breast ([place]) 02/25/2020   Family history of breast cancer     Claris Pong, PT 11/17/2020, 12:28 PM  [facility] [place], [place], [address] Phone: [phone]   Fax:  [phone]  Name: Gail Erickson MRN: [id number] Date of Birth: 11-06-1975 Cheral Almas, PT 11/17/20 12:29 PM

## 2020-11-17 NOTE — Progress Notes (Signed)
Patient is a 45 year old female here for follow-up on her right breast reconstruction.  She most recently underwent removal of right breast tissue expander and placement of right breast [place]or smooth round ultrahigh profile gel 650 cc implant and left breast mastopexy on 09/23/2020.  She is overall doing really well, continues to have a little bit of tenderness of the left lateral breast.  She has some questions about moving forward with fat grafting to the right breast for improved contour.  She reports she feels as if she has good symmetry in her close sometimes and other times feels less symmetric.  Chaperone present on exam On exam bilateral breast incisions are intact and healing well.  Left NAC is viable with good color.  No wounds are noted.  She overall has a good shape and contour of bilateral breasts.  No wounds are noted.  No erythema or cellulitic changes are noted.  No restrictions at this time.  We will plan for a virtual visit with Dr. Marla Roe to further discuss fat grafting and answer any additional questions and work on scheduling this in the next few months, likely November or December.  Recommend calling with questions or concerns.  Pictures were taken and placed in the patient's chart with patient's permission.

## 2020-11-19 DIAGNOSIS — Z170 Estrogen receptor positive status [ER+]: Secondary | ICD-10-CM | POA: Diagnosis not present

## 2020-11-19 DIAGNOSIS — C50811 Malignant neoplasm of overlapping sites of right female breast: Secondary | ICD-10-CM | POA: Diagnosis not present

## 2020-11-24 ENCOUNTER — Ambulatory Visit: Payer: BC Managed Care – PPO

## 2020-11-24 ENCOUNTER — Other Ambulatory Visit: Payer: Self-pay

## 2020-11-24 DIAGNOSIS — M25511 Pain in right shoulder: Secondary | ICD-10-CM

## 2020-11-24 DIAGNOSIS — C50411 Malignant neoplasm of upper-outer quadrant of right female breast: Secondary | ICD-10-CM | POA: Diagnosis not present

## 2020-11-24 DIAGNOSIS — M25611 Stiffness of right shoulder, not elsewhere classified: Secondary | ICD-10-CM

## 2020-11-24 DIAGNOSIS — Z170 Estrogen receptor positive status [ER+]: Secondary | ICD-10-CM | POA: Diagnosis not present

## 2020-11-24 DIAGNOSIS — C50811 Malignant neoplasm of overlapping sites of right female breast: Secondary | ICD-10-CM | POA: Diagnosis not present

## 2020-11-24 DIAGNOSIS — M25512 Pain in left shoulder: Secondary | ICD-10-CM | POA: Diagnosis not present

## 2020-11-24 DIAGNOSIS — Z483 Aftercare following surgery for neoplasm: Secondary | ICD-10-CM

## 2020-11-24 DIAGNOSIS — R293 Abnormal posture: Secondary | ICD-10-CM | POA: Diagnosis not present

## 2020-11-24 NOTE — Therapy (Signed)
[place], [place], [address] Phone: [phone]   Fax:  [phone]  Physical Therapy Treatment  Patient Details  Name: Gail Erickson MRN: [id number] Date of Birth: Sep 12, 1975 Referring Provider (PT): Dr. Marlou Starks   Encounter Date: 11/24/2020   PT End of Session - 11/24/20 1714     Visit Number 24    Number of Visits 27    Date for PT Re-Evaluation 12/15/20    PT Start Time 1605    PT Stop Time 1704    PT Time Calculation (min) 59 min    Activity Tolerance Patient tolerated treatment well    Behavior During Therapy [facility] for tasks assessed/performed             Past Medical History:  Diagnosis Date   Abnormal Pap smear of cervix 06-02-14   LGSIL:neg HR HPV (1st abn. 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   Migraine without aura    Pre-diabetes    Prediabetes 2021   sees Dr.Balan   Vitamin D deficiency 05/21/15   level = 10    Past Surgical History:  Procedure Laterality Date   BREAST RECONSTRUCTION WITH PLACEMENT OF TISSUE EXPANDER AND FLEX HD (ACELLULAR HYDRATED DERMIS) Right 04/28/2020   Procedure: IMMEDIATE RIGHT BREAST RECONSTRUCTION WITH PLACEMENT OF TISSUE EXPANDER AND FLEX HD (ACELLULAR HYDRATED DERMIS);  Surgeon: Wallace Going, DO;  Location: [place];  Service: Plastics;  Laterality: Right;   COLPOSCOPY     MASTECTOMY W/ SENTINEL NODE BIOPSY Right 04/28/2020   Procedure: RIGHT MASTECTOMY WITH SENTINEL LYMPH NODE BIOPSY;  Surgeon: [name], MD;  Location: [place];  Service: General;  Laterality: Right;  PECTORAL BLOCK   MASTOPEXY Left 09/23/2020   Procedure: LEFT BREAST MASTOPEXY;  Surgeon: Wallace Going, DO;  Location: [place];  Service: Plastics;   Laterality: Left;   REMOVAL OF TISSUE EXPANDER AND PLACEMENT OF IMPLANT Right 09/23/2020   Procedure: REMOVAL OF TISSUE EXPANDER AND PLACEMENT OF IMPLANT;  Surgeon: Wallace Going, DO;  Location: [place];  Service: Plastics;  Laterality: Right;  90 min   UMBILICAL HERNIA REPAIR     --as a child    There were no vitals filed for this visit.   Subjective Assessment - 11/24/20 1604     Subjective I just started back to work but I feel like my energy level is just not there.Haven't done the MLD much since I was last here. I was able to lift a gallon of water with my right hand without any problem.  The sharp pain on the left is still there but it is better. Saw Dr. Marlou Starks and Catalina Antigua so I will do the fat grafting and it will take a few days to heal.  My sleeve came in but it was way to short so I had to have it remade.    Pertinent History December 6th learned of Cancer from a biopsy done on Dec 3.  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 Did gene testing today, and having a biopsy on left breast tomorrow. Presently planning for right breast mastectomy but awaiting gene testing results. Pt had right mastectomy with SLNB and tissue expanders on  04/28/2020. I am able to sleep on my right arm longer now, but sometimes my arm feels heavy/tingling when I wake up but it resolves quickly. Started back to the exercises last week. Not presently working.    Patient Stated Goals Reassess right UE post surgery.    Currently in Pain? No/denies    Pain Score 0-No pain                    L-DEX FLOWSHEETS - 11/24/20 1700       L-DEX LYMPHEDEMA SCREENING   Measurement Type Unilateral    L-DEX MEASUREMENT EXTREMITY Upper Extremity    POSITION  Standing    DOMINANT SIDE Right    At Risk Side Right    BASELINE SCORE (UNILATERAL) -2.7    L-DEX SCORE (UNILATERAL) -4.1    VALUE CHANGE (UNILAT) -1.4                       [facility] Adult PT Treatment/Exercise - 11/24/20 0001       Exercises    Other Exercises  Pt instructed in ABC strength handout from warm up to standing shoulder raises.  Performed activities with wts using 10 reps with 3 lbs.  She had no increased pain, but did note fatigue                          PT Long Term Goals - 11/17/20 0920       PT LONG TERM GOAL #1   Title Pt will be independent with HEP to increase right shoulder ROM and strength    Time 6    Period Weeks    Status Achieved      PT LONG TERM GOAL #2   Title Pt will have right shoulder flexion and abduction within 10 degrees of baseline    Baseline flexion achieved, abd not achieved 14 degrees from baseline today    Time 4    Period Weeks    Status On-going    Target Date 12/15/20      PT LONG TERM GOAL #3   Title Pts quick dash will be no greater than 10%    Time 4    Period Weeks    Status On-going    Target Date 12/15/20      PT LONG TERM GOAL #4   Title Pt will be fit for compression garments prn (sleeve,bra)    Baseline fit but not received    Time 4    Period Weeks    Status On-going    Target Date 12/15/20      PT LONG TERM GOAL #5   Title Pt will have decreased pain/tightness by greater than 50%    Baseline right yes,  left not yet    Time 4    Period Weeks    Status On-going    Target Date 12/15/20      Additional Long Term Goals   Additional Long Term Goals Yes      PT LONG TERM GOAL #6   Title Pt will be educated in [facility] strength handout for progression of independent strengthening    Time 4    Period Weeks    Status New    Target Date 12/15/20                   Plan - 11/24/20 1721     Clinical Impression Statement Pt has started  back to work full time and notes easy fatigue.  We performed SOZO today with good results.  Pt was instructed in [facility] strength handout including warmup, stretching, core strength and resisted exercises up to and including shoulder elevation.  She used 3 lbs for resistance. She used very good form with  exercises but did have difficulty stabilizing for quad superwoman exercise and maintaining neutral spine.  She used excellent form with mini squats and dead lifts.  She had no complaints of pain with exercises.    Personal Factors and Comorbidities Comorbidity 1    Comorbidities Right breast CA with implant on right, mastopexy on left    Examination-Activity Limitations Reach Overhead;Sleep;Dressing    Examination-Participation Restrictions Occupation    Stability/Clinical Decision Making Stable/Uncomplicated    PT Frequency 1x / week    PT Duration 4 weeks    PT Treatment/Interventions ADLs/Self Care Home Management;Therapeutic exercise;Manual techniques;Patient/family education;Manual lymph drainage;Scar mobilization;Passive range of motion    PT Next Visit Plan Continue ABC;resume after shoulder elevation    Consulted and Agree with Plan of Care Patient             Patient will benefit from skilled therapeutic intervention in order to improve the following deficits and impairments:  Decreased activity tolerance, Decreased knowledge of precautions, Decreased range of motion, Decreased strength, Decreased scar mobility, Increased edema, Impaired sensation, Postural dysfunction, Impaired UE functional use, Pain  Visit Diagnosis: Malignant neoplasm of overlapping sites of right breast (HCC)  Stiffness of right shoulder, not elsewhere classified  Abnormal posture  Aftercare following surgery for neoplasm  Malignant neoplasm of upper-outer quadrant of right breast in female, estrogen receptor positive ([place])  Acute pain of right shoulder  Estrogen receptor positive     Problem List Patient Active Problem List   Diagnosis Date Noted   Acquired absence of breast 10/01/2020   Encounter to establish care 09/09/2020   Screening due 09/09/2020   HLD (hyperlipidemia) 09/09/2020   Vitamin D deficiency 09/09/2020   Genetic testing 03/08/2020   Malignant neoplasm of overlapping sites  of right breast ([place]) 02/25/2020   Family history of breast cancer     Claris Pong, PT 11/24/2020, 5:27 PM  [place], [place], [address] Phone: [phone]   Fax:  [phone]  Name: JELIAH APKARIAN MRN: [id number] Date of Birth: 01/05/1976

## 2020-11-30 ENCOUNTER — Ambulatory Visit: Payer: BC Managed Care – PPO

## 2020-11-30 ENCOUNTER — Other Ambulatory Visit: Payer: Self-pay

## 2020-11-30 DIAGNOSIS — M25611 Stiffness of right shoulder, not elsewhere classified: Secondary | ICD-10-CM

## 2020-11-30 DIAGNOSIS — Z483 Aftercare following surgery for neoplasm: Secondary | ICD-10-CM

## 2020-11-30 DIAGNOSIS — C50811 Malignant neoplasm of overlapping sites of right female breast: Secondary | ICD-10-CM | POA: Diagnosis not present

## 2020-11-30 DIAGNOSIS — M25511 Pain in right shoulder: Secondary | ICD-10-CM

## 2020-11-30 DIAGNOSIS — C50411 Malignant neoplasm of upper-outer quadrant of right female breast: Secondary | ICD-10-CM

## 2020-11-30 DIAGNOSIS — R293 Abnormal posture: Secondary | ICD-10-CM | POA: Diagnosis not present

## 2020-11-30 DIAGNOSIS — Z170 Estrogen receptor positive status [ER+]: Secondary | ICD-10-CM

## 2020-11-30 DIAGNOSIS — M25512 Pain in left shoulder: Secondary | ICD-10-CM

## 2020-11-30 NOTE — Therapy (Signed)
[place], [place], [address] Phone: [phone]   Fax:  [phone]  Physical Therapy Treatment  Patient Details  Name: Gail Erickson MRN: [id number] Date of Birth: October 11, 1975 Referring Provider (PT): Dr. Marlou Starks   Encounter Date: 11/30/2020   PT End of Session - 11/30/20 1805     Visit Number 25    Number of Visits 27    Date for PT Re-Evaluation 12/15/20    PT Start Time 1501    PT Stop Time 1558    PT Time Calculation (min) 57 min    Activity Tolerance Patient tolerated treatment well    Behavior During Therapy [facility] for tasks assessed/performed             Past Medical History:  Diagnosis Date   Abnormal Pap smear of cervix 06-02-14   LGSIL:neg HR HPV (1st abn. 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   Migraine without aura    Pre-diabetes    Prediabetes 2021   sees Dr.Balan   Vitamin D deficiency 05/21/15   level = 10    Past Surgical History:  Procedure Laterality Date   BREAST RECONSTRUCTION WITH PLACEMENT OF TISSUE EXPANDER AND FLEX HD (ACELLULAR HYDRATED DERMIS) Right 04/28/2020   Procedure: IMMEDIATE RIGHT BREAST RECONSTRUCTION WITH PLACEMENT OF TISSUE EXPANDER AND FLEX HD (ACELLULAR HYDRATED DERMIS);  Surgeon: Wallace Going, DO;  Location: [place];  Service: Plastics;  Laterality: Right;   COLPOSCOPY     MASTECTOMY W/ SENTINEL NODE BIOPSY Right 04/28/2020   Procedure: RIGHT MASTECTOMY WITH SENTINEL LYMPH NODE BIOPSY;  Surgeon: [name], MD;  Location: [place];  Service: General;  Laterality: Right;  PECTORAL BLOCK   MASTOPEXY Left 09/23/2020   Procedure: LEFT BREAST MASTOPEXY;  Surgeon: Wallace Going, DO;  Location: [place];  Service: Plastics;   Laterality: Left;   REMOVAL OF TISSUE EXPANDER AND PLACEMENT OF IMPLANT Right 09/23/2020   Procedure: REMOVAL OF TISSUE EXPANDER AND PLACEMENT OF IMPLANT;  Surgeon: Wallace Going, DO;  Location: [place];  Service: Plastics;  Laterality: Right;  90 min   UMBILICAL HERNIA REPAIR     --as a child    There were no vitals filed for this visit.   Subjective Assessment - 11/30/20 1501     Subjective Last week I was really drained with work and I worked out daily with my walking 3 miles/day. Went to [place]. It was hard and my knees bothered me a little. I wasn't muscle sore at all. I did not try  the ABC handout at all.I have been a little more sore in my left lats, maybe from the way I position my body at night. No increased pain after last visit.    Pertinent History December 6th learned of Cancer from a biopsy done on Dec 3.  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 Did gene testing today, and having a biopsy on left breast tomorrow. Presently planning for right breast mastectomy but awaiting gene testing results. Pt had right mastectomy with SLNB and tissue expanders on 04/28/2020. I am able to sleep on my right arm longer now, but sometimes my arm feels heavy/tingling when  I wake up but it resolves quickly. Started back to the exercises last week. Not presently working.    Patient Stated Goals Reassess right UE post surgery.    Currently in Pain? Yes    Pain Score 2     Pain Orientation Left    Pain Descriptors / Indicators Tightness;Sore    Pain Frequency Intermittent   with stretching   Multiple Pain Sites No                               [facility] Adult PT Treatment/Exercise - 11/30/20 0001       Exercises   Other Exercises  Pt instructed in remainder of ABC strength handout with 3 # wts x 10 reps ea      Shoulder Exercises: Standing   Other Standing Exercises standing ball stabs on wall x 20 up/down/side to side     Shoulder Exercises: Pulleys    Scaption 2 minutes    ABduction 2 minutes      Shoulder Exercises: Therapy Ball   ABduction Both;5 reps      Manual Therapy   Soft tissue mobilization to bilateral pectoralis, UT, lats,serratus  and scapular region in supine/SL to decrease tissue tension                          PT Long Term Goals - 11/17/20 0920       PT LONG TERM GOAL #1   Title Pt will be independent with HEP to increase right shoulder ROM and strength    Time 6    Period Weeks    Status Achieved      PT LONG TERM GOAL #2   Title Pt will have right shoulder flexion and abduction within 10 degrees of baseline    Baseline flexion achieved, abd not achieved 14 degrees from baseline today    Time 4    Period Weeks    Status On-going    Target Date 12/15/20      PT LONG TERM GOAL #3   Title Pts quick dash will be no greater than 10%    Time 4    Period Weeks    Status On-going    Target Date 12/15/20      PT LONG TERM GOAL #4   Title Pt will be fit for compression garments prn (sleeve,bra)    Baseline fit but not received    Time 4    Period Weeks    Status On-going    Target Date 12/15/20      PT LONG TERM GOAL #5   Title Pt will have decreased pain/tightness by greater than 50%    Baseline right yes,  left not yet    Time 4    Period Weeks    Status On-going    Target Date 12/15/20      Additional Long Term Goals   Additional Long Term Goals Yes      PT LONG TERM GOAL #6   Title Pt will be educated in [facility] strength handout for progression of independent strengthening    Time 4    Period Weeks    Status New    Target Date 12/15/20                   Plan - 11/30/20 1805     Clinical Impression Statement Treatment consisted of warm up on  pulleys and with therapy ball for ROM, and instruction in remaining aspects of ABC strength handout from Triceps kickback to the end using 3 lb wts.  Pt had noticeable fatigue left greater than right.  She has noted increased  serratus and lat pain especially on the left, but also on the right.  Soft tissue mobilization was performed to both sides with greatest increase in tissue tension noted on the left.    Personal Factors and Comorbidities Comorbidity 1    Comorbidities Right breast CA with implant on right, mastopexy on left    Examination-Activity Limitations Reach Overhead;Sleep;Dressing    Examination-Participation Restrictions Occupation    Stability/Clinical Decision Making Stable/Uncomplicated    Rehab Potential Excellent    PT Frequency 1x / week    PT Duration 4 weeks    PT Treatment/Interventions ADLs/Self Care Home Management;Therapeutic exercise;Manual techniques;Patient/family education;Manual lymph drainage;Scar mobilization;Passive range of motion    PT Next Visit Plan Review ABC prn, Manual prn    PT Home Exercise Plan 4 post op exercises, supine wand flex/scaption, corner stretch, standing band(SR, shoulder extension, bilateral ER), ABC strength handout    Consulted and Agree with Plan of Care Patient             Patient will benefit from skilled therapeutic intervention in order to improve the following deficits and impairments:  Decreased activity tolerance, Decreased knowledge of precautions, Decreased range of motion, Decreased strength, Decreased scar mobility, Increased edema, Impaired sensation, Postural dysfunction, Impaired UE functional use, Pain  Visit Diagnosis: Malignant neoplasm of overlapping sites of right breast (HCC)  Stiffness of right shoulder, not elsewhere classified  Abnormal posture  Aftercare following surgery for neoplasm  Malignant neoplasm of upper-outer quadrant of right breast in female, estrogen receptor positive ([place])  Acute pain of right shoulder  Estrogen receptor positive  Acute pain of left shoulder     Problem List Patient Active Problem List   Diagnosis Date Noted   Acquired absence of breast 10/01/2020   Encounter to establish care  09/09/2020   Screening due 09/09/2020   HLD (hyperlipidemia) 09/09/2020   Vitamin D deficiency 09/09/2020   Genetic testing 03/08/2020   Malignant neoplasm of overlapping sites of right breast ([place]) 02/25/2020   Family history of breast cancer     Claris Pong, PT 11/30/2020, 6:11 PM  [facility] [address] [place], [place], [address] Phone: [phone]   Fax:  [phone]  Name: NAKYLA BRACCO MRN: [id number] Date of Birth: [birth year]

## 2020-12-08 ENCOUNTER — Ambulatory Visit: Payer: BC Managed Care – PPO

## 2020-12-08 DIAGNOSIS — F4320 Adjustment disorder, unspecified: Secondary | ICD-10-CM | POA: Diagnosis not present

## 2020-12-10 ENCOUNTER — Telehealth (INDEPENDENT_AMBULATORY_CARE_PROVIDER_SITE_OTHER): Payer: BC Managed Care – PPO | Admitting: Plastic Surgery

## 2020-12-10 ENCOUNTER — Other Ambulatory Visit: Payer: Self-pay

## 2020-12-10 DIAGNOSIS — C50811 Malignant neoplasm of overlapping sites of right female breast: Secondary | ICD-10-CM

## 2020-12-10 NOTE — Progress Notes (Signed)
   Subjective:    Patient ID: Gail Erickson, female    DOB: 1975-07-09, 45 y.o.   MRN: [id number]  The patient is a 45 year old female joining me by televisit for discussion of her breast reconstruction.  She had a right sided mastectomy and underwent implant-based reconstruction.  She had the implant placed in July with a left breast mastopexy.  She has a Product manager ultrahigh profile 650 cc gel implant.  She is very happy with the left breast.  She has some asymmetry with some loss of volume of the right medial and superior medial breast.  She would like to have better symmetry.  She is otherwise doing really well and back to work.     Review of Systems  Constitutional: Negative.   HENT: Negative.    Eyes: Negative.   Respiratory: Negative.    Cardiovascular: Negative.  Negative for leg swelling.  Gastrointestinal: Negative.   Endocrine: Negative.   Genitourinary: Negative.   Musculoskeletal: Negative.   Skin: Negative.   Psychiatric/Behavioral: Negative.        Objective:   Physical Exam      Assessment & Plan:     ICD-10-CM   1. Malignant neoplasm of overlapping sites of right breast ([place])  C50.811        I connected with  Gail Erickson on 12/10/20 by a video enabled telemedicine application and verified that I am speaking with the correct person using two identifiers.  We spent 15 minutes in the following manner: Review of chart, review of patient's situation, discussion of options and documentation   I discussed the limitations of evaluation and management by telemedicine. The patient expressed understanding and agreed to proceed. The patient is a good candidate for fat filling of the right breast.

## 2020-12-15 ENCOUNTER — Ambulatory Visit: Payer: BC Managed Care – PPO | Attending: General Surgery

## 2020-12-15 ENCOUNTER — Other Ambulatory Visit: Payer: Self-pay

## 2020-12-15 DIAGNOSIS — C50811 Malignant neoplasm of overlapping sites of right female breast: Secondary | ICD-10-CM | POA: Insufficient documentation

## 2020-12-15 DIAGNOSIS — M25511 Pain in right shoulder: Secondary | ICD-10-CM | POA: Diagnosis not present

## 2020-12-15 DIAGNOSIS — Z170 Estrogen receptor positive status [ER+]: Secondary | ICD-10-CM | POA: Insufficient documentation

## 2020-12-15 DIAGNOSIS — Z483 Aftercare following surgery for neoplasm: Secondary | ICD-10-CM | POA: Diagnosis not present

## 2020-12-15 DIAGNOSIS — R293 Abnormal posture: Secondary | ICD-10-CM | POA: Diagnosis not present

## 2020-12-15 DIAGNOSIS — M25611 Stiffness of right shoulder, not elsewhere classified: Secondary | ICD-10-CM | POA: Insufficient documentation

## 2020-12-15 DIAGNOSIS — C50411 Malignant neoplasm of upper-outer quadrant of right female breast: Secondary | ICD-10-CM | POA: Insufficient documentation

## 2020-12-15 DIAGNOSIS — M25512 Pain in left shoulder: Secondary | ICD-10-CM | POA: Insufficient documentation

## 2020-12-15 NOTE — Therapy (Signed)
[facility] @ [place], [place], [address] Phone:     Fax:     Physical Therapy Treatment  Patient Details  Name: Gail Erickson MRN: [id number] Date of Birth: 02-14-1976 Referring Provider (PT): Dr. Marlou Starks   Encounter Date: 12/15/2020   PT End of Session - 12/15/20 0858     Visit Number 26    Number of Visits 27    Date for PT Re-Evaluation 12/15/20    PT Start Time 0758    PT Stop Time 0854    PT Time Calculation (min) 56 min    Activity Tolerance Patient tolerated treatment well    Behavior During Therapy [facility] for tasks assessed/performed             Past Medical History:  Diagnosis Date   Abnormal Pap smear of cervix 06-02-14   LGSIL:neg HR HPV (1st abn. 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   Migraine without aura    Pre-diabetes    Prediabetes 2021   sees Dr.Balan   Vitamin D deficiency 05/21/15   level = 10    Past Surgical History:  Procedure Laterality Date   BREAST RECONSTRUCTION WITH PLACEMENT OF TISSUE EXPANDER AND FLEX HD (ACELLULAR HYDRATED DERMIS) Right 04/28/2020   Procedure: IMMEDIATE RIGHT BREAST RECONSTRUCTION WITH PLACEMENT OF TISSUE EXPANDER AND FLEX HD (ACELLULAR HYDRATED DERMIS);  Surgeon: Wallace Going, DO;  Location: [place];  Service: Plastics;  Laterality: Right;   COLPOSCOPY     MASTECTOMY W/ SENTINEL NODE BIOPSY Right 04/28/2020   Procedure: RIGHT MASTECTOMY WITH SENTINEL LYMPH NODE BIOPSY;  Surgeon: [name], MD;  Location: [place];  Service: General;  Laterality: Right;  PECTORAL BLOCK   MASTOPEXY Left 09/23/2020   Procedure: LEFT BREAST MASTOPEXY;  Surgeon: Wallace Going, DO;  Location: [place];  Service: Plastics;  Laterality: Left;    REMOVAL OF TISSUE EXPANDER AND PLACEMENT OF IMPLANT Right 09/23/2020   Procedure: REMOVAL OF TISSUE EXPANDER AND PLACEMENT OF IMPLANT;  Surgeon: Wallace Going, DO;  Location: [place];  Service: Plastics;  Laterality: Right;  90 min   UMBILICAL HERNIA REPAIR     --as a child    There were no vitals filed for this visit.   Subjective Assessment - 12/15/20 0757     Subjective I have been exhausted with work and life . My surgery for fat grafting is scheduled for December.This weekend I felt like I was getting really tight.  I did some exercises and it started to loosen up.  I started to have the left breast pain again but I may just be doing too much.    Pertinent History December 6th learned of Cancer from a biopsy done on Dec 3.  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 Did gene testing today, and having a biopsy on left breast tomorrow. Presently planning for right breast mastectomy but awaiting gene testing results. Pt had right mastectomy with SLNB and tissue expanders on 04/28/2020. I am able to sleep on my right arm longer now, but sometimes my arm feels heavy/tingling when I wake up but it resolves quickly. Started back to the exercises last week. Not  presently working.    Patient Stated Goals Reassess right UE post surgery.    Currently in Pain? No/denies    Pain Score 0-No pain                [facility] PT Assessment - 12/15/20 0001       Assessment   Medical Diagnosis S/p Right mastectomy    Referring Provider (PT) Dr. Marlou Starks    Onset Date/Surgical Date 04/28/20    Hand Dominance Right      AROM   Right Shoulder Flexion 169 Degrees    Right Shoulder ABduction 175 Degrees    Left Shoulder Flexion 171 Degrees    Left Shoulder ABduction 178 Degrees                           [facility] Adult PT Treatment/Exercise - 12/15/20 0001       Manual Therapy   Soft tissue mobilization to bilateral pectoralis, UT, lats and serratus in suine/SL to decrease tissue tension     Passive ROM PROM to bilateral shoulders prior to assessment of ROM                          PT Long Term Goals - 12/15/20 0813       PT LONG TERM GOAL #1   Title Pt will be independent with HEP to increase right shoulder ROM and strength    Time 6    Period Weeks    Status Achieved      PT LONG TERM GOAL #2   Title Pt will have right shoulder flexion and abduction within 10 degrees of baseline    Time 4    Period Weeks    Status Achieved      PT LONG TERM GOAL #3   Title Pts quick dash will be no greater than 10%    Time 4    Period Weeks    Status Unable to assess      PT LONG TERM GOAL #4   Title Pt will be fit for compression garments prn (sleeve,bra)    Baseline fit but not received, pt will follow up with [facility]    Time 4    Period Weeks    Status Partially Met    Target Date 12/15/20      PT LONG TERM GOAL #5   Title Pt will have decreased pain/tightness by greater than 50%    Time 4    Period Weeks    Status Achieved    Target Date 12/15/20      PT LONG TERM GOAL #6   Title Pt will be educated in [employer] strength handout for progression of independent strengthening    Time 4    Period Weeks    Status Achieved    Target Date 12/15/20                   Plan - 12/15/20 0900     Clinical Impression Statement Pt came in for her final treatment.  Feeling much better overall but occasionally experiencing tightness in the chest area and intermittent pain under left breast.  She has been busy with work and family and has not had time to exercises.  she is pending a fat grafting surgery in December.  We discussed the importance of taking time for herself and performing strength exercises atleast 3 days per week, and flexibility  on a daily basis prn for tightness.  It is also important for her mental health to get back on her walking routine.  She is in agreement with this.  Her ROM has improved nicely and she is back to baseline.   Overall pain is improved significantly.  She was fit for but has not yet received her compression sleeve, and will follow up with [facility]. She is agreeable with discharge.    Personal Factors and Comorbidities Comorbidity 1    Comorbidities Right breast CA with implant on right, mastopexy on left    Examination-Activity Limitations Reach Overhead;Sleep;Dressing    Examination-Participation Restrictions Occupation    Stability/Clinical Decision Making Stable/Uncomplicated    Rehab Potential Excellent    PT Frequency 1x / week    PT Treatment/Interventions ADLs/Self Care Home Management;Therapeutic exercise;Manual techniques;Patient/family education;Manual lymph drainage;Scar mobilization;Passive range of motion    PT Next Visit Plan DC to HEP    PT Home Exercise Plan 4 post op exercises, supine wand flex/scaption, corner stretch, standing band(SR, shoulder extension, bilateral ER), ABC strength handout    Consulted and Agree with Plan of Care Patient             Patient will benefit from skilled therapeutic intervention in order to improve the following deficits and impairments:  Decreased activity tolerance, Decreased knowledge of precautions, Decreased range of motion, Decreased strength, Decreased scar mobility, Increased edema, Impaired sensation, Postural dysfunction, Impaired UE functional use, Pain  Visit Diagnosis: Malignant neoplasm of overlapping sites of right breast (HCC)  Stiffness of right shoulder, not elsewhere classified  Abnormal posture  Aftercare following surgery for neoplasm  Malignant neoplasm of upper-outer quadrant of right breast in female, estrogen receptor positive ([place])  Acute pain of right shoulder  Acute pain of left shoulder     Problem List Patient Active Problem List   Diagnosis Date Noted   Acquired absence of breast 10/01/2020   Encounter to establish care 09/09/2020   Screening due 09/09/2020   HLD (hyperlipidemia) 09/09/2020    Vitamin D deficiency 09/09/2020   Genetic testing 03/08/2020   Malignant neoplasm of overlapping sites of right breast ([place]) 02/25/2020   Family history of breast cancer   PHYSICAL THERAPY DISCHARGE SUMMARY  Visits from Start of Care: 26  Current functional level related to goals / functional outcomes: Achieved most goals   Remaining deficits: Intermittent chest tightness   Education / Equipment: Fit for compression sleeve but has not received yet   Patient agrees to discharge. Patient goals were met. Patient is being discharged due to meeting the stated rehab goals.  Claris Pong, PT 12/15/2020, 9:05 AM  [facility] @ [place], [place], [address] Phone:     Fax:     Name: JONE PANEBIANCO MRN: [id number] Date of Birth: 06/26/1975

## 2021-01-04 DIAGNOSIS — F4320 Adjustment disorder, unspecified: Secondary | ICD-10-CM | POA: Diagnosis not present

## 2021-01-10 DIAGNOSIS — L680 Hirsutism: Secondary | ICD-10-CM | POA: Diagnosis not present

## 2021-01-10 DIAGNOSIS — E8881 Metabolic syndrome: Secondary | ICD-10-CM | POA: Diagnosis not present

## 2021-01-17 DIAGNOSIS — R7301 Impaired fasting glucose: Secondary | ICD-10-CM | POA: Diagnosis not present

## 2021-01-17 DIAGNOSIS — L680 Hirsutism: Secondary | ICD-10-CM | POA: Diagnosis not present

## 2021-01-17 DIAGNOSIS — E7800 Pure hypercholesterolemia, unspecified: Secondary | ICD-10-CM | POA: Diagnosis not present

## 2021-01-17 DIAGNOSIS — E8881 Metabolic syndrome: Secondary | ICD-10-CM | POA: Diagnosis not present

## 2021-01-19 DIAGNOSIS — F4320 Adjustment disorder, unspecified: Secondary | ICD-10-CM | POA: Diagnosis not present

## 2021-01-26 ENCOUNTER — Encounter: Payer: Self-pay | Admitting: Obstetrics and Gynecology

## 2021-01-26 ENCOUNTER — Other Ambulatory Visit: Payer: Self-pay

## 2021-01-26 ENCOUNTER — Other Ambulatory Visit (HOSPITAL_COMMUNITY)
Admission: RE | Admit: 2021-01-26 | Discharge: 2021-01-26 | Disposition: A | Discharge status: Home / Self Care | Payer: BC Managed Care – PPO | Source: Ambulatory Visit | Attending: Obstetrics and Gynecology | Admitting: Obstetrics and Gynecology

## 2021-01-26 ENCOUNTER — Ambulatory Visit (INDEPENDENT_AMBULATORY_CARE_PROVIDER_SITE_OTHER): Payer: BC Managed Care – PPO | Admitting: Obstetrics and Gynecology

## 2021-01-26 VITALS — BP 124/80 | HR 84 | Temp 98.9°F | Ht 67.75 in | Wt 229.0 lb

## 2021-01-26 DIAGNOSIS — Z113 Encounter for screening for infections with a predominantly sexual mode of transmission: Secondary | ICD-10-CM | POA: Diagnosis not present

## 2021-01-26 DIAGNOSIS — M545 Low back pain, unspecified: Secondary | ICD-10-CM

## 2021-01-26 DIAGNOSIS — N760 Acute vaginitis: Secondary | ICD-10-CM | POA: Diagnosis not present

## 2021-01-26 MED ORDER — SULFAMETHOXAZOLE-TRIMETHOPRIM 800-160 MG PO TABS: 1.0000 | ORAL_TABLET | Freq: Two times a day (BID) | ORAL | 0 refills | Status: DC

## 2021-01-26 NOTE — Progress Notes (Signed)
GYNECOLOGY  VISIT   HPI: 45 y.o.   Single  African American  female   [place] with Patient's last menstrual period was 01/05/2021.   here for vaginal irritation and low right back pain, starting 4 weeks ago.  She noticed cloudy urine and some pink coloration to it.  No dysuria.  Some frequency.  No nausea or vomiting.  No fever.  Has tried heat and ibuprofen for back pain.   Some vaginal itching.  Used some new undergarment. Some vaginal discharge and some odor.  No over the counter treatments.   New partner.   Menses once a month.   Has one additional breast surgery in December, 2022.   GYNECOLOGIC HISTORY: Patient's last menstrual period was 01/05/2021. Contraception:  condoms everytime Menopausal hormone therapy:  none Last mammogram:  Appt. 01-31-21 Last pap smear:   06-02-2019 neg HPV HR neg        OB History     Gravida  0   Para  0   Term  0   Preterm  0   AB  0   Living  0      SAB  0   IAB  0   Ectopic  0   Multiple  0   Live Births                 Patient Active Problem List   Diagnosis Date Noted   Acquired absence of breast 10/01/2020   Encounter to establish care 09/09/2020   Screening due 09/09/2020   HLD (hyperlipidemia) 09/09/2020   Vitamin D deficiency 09/09/2020   Genetic testing 03/08/2020   Malignant neoplasm of overlapping sites of right breast ([facility]) 02/25/2020   Family history of breast cancer     Past Medical History:  Diagnosis Date   Abnormal Pap smear of cervix 06-02-14   LGSIL:neg HR HPV (1st abn. pap)   Cyst of right breast    --stable in 2015 U/S done in [place], [place]   Elevated DHEA 05/21/15   level = 343   Family history of breast cancer    HSV-1 (herpes simplex virus 1) infection    oral HSV   Migraine without aura    Pre-diabetes    Prediabetes 2021   sees Dr.Balan   Vitamin D deficiency 05/21/15   level = 10    Past Surgical History:  Procedure Laterality Date   BREAST RECONSTRUCTION WITH  PLACEMENT OF TISSUE EXPANDER AND FLEX HD (ACELLULAR HYDRATED DERMIS) Right 04/28/2020   Procedure: IMMEDIATE RIGHT BREAST RECONSTRUCTION WITH PLACEMENT OF TISSUE EXPANDER AND FLEX HD (ACELLULAR HYDRATED DERMIS);  Surgeon: Wallace Going, DO;  Location: [facility];  Service: Plastics;  Laterality: Right;   COLPOSCOPY     MASTECTOMY W/ SENTINEL NODE BIOPSY Right 04/28/2020   Procedure: RIGHT MASTECTOMY WITH SENTINEL LYMPH NODE BIOPSY;  Surgeon: [name], MD;  Location: [facility];  Service: General;  Laterality: Right;  PECTORAL BLOCK   MASTOPEXY Left 09/23/2020   Procedure: LEFT BREAST MASTOPEXY;  Surgeon: Wallace Going, DO;  Location: [facility];  Service: Plastics;  Laterality: Left;   REMOVAL OF TISSUE EXPANDER AND PLACEMENT OF IMPLANT Right 09/23/2020   Procedure: REMOVAL OF TISSUE EXPANDER AND PLACEMENT OF IMPLANT;  Surgeon: Wallace Going, DO;  Location: [facility];  Service: Plastics;  Laterality: Right;  90 min   UMBILICAL HERNIA REPAIR     --as a child    Current Outpatient  Medications  Medication Sig Dispense Refill   Ascorbic Acid (VITAMIN C) 1000 MG tablet Take 1,000 mg by mouth daily.     Aspirin-Acetaminophen-Caffeine (EXCEDRIN MIGRAINE PO) Take 1 tablet by mouth as needed.     ibuprofen (ADVIL) 600 MG tablet Take 1 tablet (600 mg total) by mouth every 8 (eight) hours as needed for moderate pain. For use AFTER surgery 30 tablet 0   Multiple Vitamin (MULTIVITAMIN) tablet Take 1 tablet by mouth daily.     Omega-3 Fatty Acids (FISH OIL) 1000 MG CAPS Take by mouth.     Probiotic Product (PROBIOTIC-10 PO) Take 1 tablet by mouth.     tamoxifen (NOLVADEX) 20 MG tablet TAKE 1 TABLET BY MOUTH  DAILY 90 tablet 3   VITAMIN D PO Take 1,000 Int'l Units by mouth.     No current facility-administered medications for this visit.     ALLERGIES: Patient has no known allergies.  Family History  Problem Relation  Age of Onset   Breast cancer Mother 47   Diabetes Mother    Hypertension Mother    Hyperlipidemia Mother    Thyroid disease Mother    Transient ischemic attack Mother    Breast cancer Paternal Aunt 5       bil. Breast ca--deceased CHF   Breast cancer Cousin 17       maternal first cousin died Breast ca age 89   Hyperlipidemia Father    HIV/AIDS Maternal Uncle    Cancer Paternal Uncle        NOS - needed BMT   Cancer Maternal Grandmother 37       adrenal gland cancer   Breast cancer Paternal Grandmother        dx in her mid to late 63s   Aneurysm Maternal Aunt 26       Brain   Kidney disease Maternal Uncle    Cancer Other        2 maternal great uncles with cancer NOS   Breast cancer Maternal Aunt 72       Breast cancer   Breast cancer Maternal Aunt 72    Social History   Socioeconomic History   Marital status: Single    Spouse name: Not on file   Number of children: 0   Years of education: Not on file   Highest education level: Not on file  Occupational History   Not on file  Tobacco Use   Smoking status: Never   Smokeless tobacco: Never  Vaping Use   Vaping Use: Never used  Substance and Sexual Activity   Alcohol use: Yes    Comment: 1 drink per month   Drug use: Never   Sexual activity: Yes    Partners: Male    Birth control/protection: Condom    Comment: condoms everytime, not sexually active since 4/22  Other Topics Concern   Not on file  Social History Narrative   -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  Musculoskeletal:  Positive for back pain (low back pain).   All other systems reviewed and are negative.  PHYSICAL EXAMINATION:  BP 124/80   Pulse 84   Temp 98.9 F (37.2 C) (Oral)   Ht 5' 7.75" (1.721 m)   Wt 229 lb (103.9 kg)   LMP 01/05/2021   SpO2 99%   BMI 35.08 kg/m     General appearance: alert, cooperative and appears stated age  Abdomen: soft, non-tender, no masses,  no organomegaly Back:  some right SI joint pain.  No abnormal inguinal nodes palpated  Pelvic: External genitalia:  no lesions              Urethra:  normal appearing urethra with no masses, tenderness or lesions              Bartholins and Skenes: normal                 Vagina: normal appearing vagina with normal color and discharge, no lesions              Cervix: no lesions                Bimanual Exam:  Uterus:  normal size, contour, position, consistency, mobility, non-tender              Adnexa: no mass, fullness, tenderness              Chaperone was present for exam:  Estill Bamberg, CMA  ASSESSMENT  Right back pain.  Vaginitis.  STD screening. Right breast cancer.  On Tamoxifen.   PLAN  Urinalysis:  sg 1.020, ph 5.5, 0 - 5 WBC, NS RBC, 6 - 10 squams, moderate bacteria.  UC sent.  Start bactrim DS po bid x 3 days.  Nuswab for GC/CT/trich, yeast and BV.  If back pain persists, may need to see PCP.  FU prn.    An After Visit Summary was printed and given to the patient.  26 min  total time was spent for this patient encounter, including preparation, face-to-face counseling with the patient, coordination of care, and documentation of the encounter.

## 2021-01-27 LAB — CERVICOVAGINAL ANCILLARY ONLY
Bacterial Vaginitis (gardnerella): NEGATIVE
Candida Glabrata: NEGATIVE
Candida Vaginitis: NEGATIVE
Chlamydia: NEGATIVE
Comment: NEGATIVE
Comment: NEGATIVE
Comment: NEGATIVE
Comment: NEGATIVE
Comment: NEGATIVE
Comment: NORMAL
Neisseria Gonorrhea: NEGATIVE
Trichomonas: NEGATIVE

## 2021-01-28 ENCOUNTER — Other Ambulatory Visit: Payer: Self-pay

## 2021-01-28 ENCOUNTER — Ambulatory Visit (INDEPENDENT_AMBULATORY_CARE_PROVIDER_SITE_OTHER): Payer: BC Managed Care – PPO | Admitting: Physician Assistant

## 2021-01-28 ENCOUNTER — Encounter: Payer: Self-pay | Admitting: Physician Assistant

## 2021-01-28 VITALS — BP 122/80 | HR 75 | Ht 69.0 in | Wt 227.0 lb

## 2021-01-28 DIAGNOSIS — C50811 Malignant neoplasm of overlapping sites of right female breast: Secondary | ICD-10-CM

## 2021-01-28 LAB — URINALYSIS, COMPLETE W/RFL CULTURE
Bilirubin Urine: NEGATIVE
Casts: NONE SEEN /LPF
Crystals: NONE SEEN /HPF
Glucose, UA: NEGATIVE
Hgb urine dipstick: NEGATIVE
Hyaline Cast: NONE SEEN /LPF
Leukocyte Esterase: NEGATIVE
Nitrites, Initial: NEGATIVE
Protein, ur: NEGATIVE
RBC / HPF: NONE SEEN /HPF (ref 0–2)
Specific Gravity, Urine: 1.02 (ref 1.001–1.035)
Yeast: NONE SEEN /HPF
pH: 5.5 (ref 5.0–8.0)

## 2021-01-28 LAB — URINE CULTURE
MICRO NUMBER:: 12645396
SPECIMEN QUALITY:: ADEQUATE

## 2021-01-28 LAB — CULTURE INDICATED

## 2021-01-28 MED ORDER — CEPHALEXIN 500 MG PO CAPS: 500.0000 mg | ORAL_CAPSULE | Freq: Four times a day (QID) | ORAL | 0 refills | Status: AC

## 2021-01-28 MED ORDER — ONDANSETRON 4 MG PO TBDP: 4.0000 mg | ORAL_TABLET | Freq: Three times a day (TID) | ORAL | 0 refills | Status: DC | PRN

## 2021-01-28 MED ORDER — HYDROCODONE-ACETAMINOPHEN 5-325 MG PO TABS: 1.0000 | ORAL_TABLET | Freq: Four times a day (QID) | ORAL | 0 refills | Status: AC | PRN

## 2021-01-28 NOTE — Progress Notes (Signed)
Patient ID: Gail Erickson, female    DOB: 1975/03/28, 45 y.o.   MRN: [id number]  Chief Complaint  Patient presents with   Pre-op Exam      ICD-10-CM   1. Malignant neoplasm of overlapping sites of right breast [facility])  C50.811        History of Present Illness: Gail Erickson is a 45 y.o.  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  She presents for preoperative evaluation for upcoming procedure, Lipo filling right breast, scheduled for 02/17/2021 with Dr. Marla Roe.  The patient has not had problems with anesthesia.  She is currently on tamoxifen for maintenance.  She reports that she previously had to hold her tamoxifen 2 weeks before and after surgery during her implant exchange, but was uncertain as to whether or not she would need to hold it for the surgery.  Will verify with oncology that it is okay to hold.  She reports that she is prediabetic, most recent A1c was reportedly 5.6.  She tells me that she has been feeling fatigued recently and is currently being treated with Bactrim for possible UTI.  She reports that cultures are going to result shortly and that she will let us know if there is anything concerning.  She denies any personal or family history of blood clots or clotting disorder.  Message oncologist about holding tamoxifen 2 weeks before and after surgery.  She sees them the week before surgery.  Recently prescribed Bactrim for possible UTI.  Tired/fatigued.  Afebrile.  Summary of Previous Visit: She was last seen via video encounter 12/10/2020.  At that time, it was noted that she has an ultrahigh profile 650 cc gel implant.  She is very pleased with the left breast, but had asymmetry with volume loss of medial and superior medial aspects of right breast.  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  Discussed 2 weeks off of work given how sore she will be from abdominal liposuction.  PMH  Significant for: Right-sided breast cancer s/p reconstruction, HLD.   Past Medical History: Allergies: No Known Allergies  Current Medications:  Current Outpatient Medications:    Ascorbic Acid (VITAMIN C) 1000 MG tablet, Take 1,000 mg by mouth daily., Disp: , Rfl:    Aspirin-Acetaminophen-Caffeine (EXCEDRIN MIGRAINE PO), Take 1 tablet by mouth as needed., Disp: , Rfl:    ibuprofen (ADVIL) 600 MG tablet, Take 1 tablet (600 mg total) by mouth every 8 (eight) hours as needed for moderate pain. For use AFTER surgery, Disp: 30 tablet, Rfl: 0   Multiple Vitamin (MULTIVITAMIN) tablet, Take 1 tablet by mouth daily., Disp: , Rfl:    Omega-3 Fatty Acids (FISH OIL) 1000 MG CAPS, Take by mouth., Disp: , Rfl:    Probiotic Product (PROBIOTIC-10 PO), Take 1 tablet by mouth., Disp: , Rfl:    sulfamethoxazole-trimethoprim (BACTRIM DS) 800-160 MG tablet, Take 1 tablet by mouth 2 (two) times daily. One PO BID x 3 days, Disp: 6 tablet, Rfl: 0   tamoxifen (NOLVADEX) 20 MG tablet, TAKE 1 TABLET BY MOUTH  DAILY, Disp: 90 tablet, Rfl: 3   VITAMIN D PO, Take 1,000 Int'l Units by mouth., Disp: , Rfl:   Past Medical Problems: Past Medical History:  Diagnosis Date   Abnormal Pap smear of cervix 06-02-14   LGSIL:neg HR HPV (1st abn. 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   Migraine without aura    Pre-diabetes    Prediabetes 2021   sees Dr.Balan   Vitamin D deficiency 05/21/15   level = 10    Past Surgical History: Past Surgical History:  Procedure Laterality Date   BREAST RECONSTRUCTION WITH PLACEMENT OF TISSUE EXPANDER AND FLEX HD (ACELLULAR HYDRATED DERMIS) Right 04/28/2020   Procedure: IMMEDIATE RIGHT BREAST RECONSTRUCTION WITH PLACEMENT OF TISSUE EXPANDER AND FLEX HD (ACELLULAR HYDRATED DERMIS);  Surgeon: Wallace Going, DO;   Location: [facility];  Service: Plastics;  Laterality: Right;   COLPOSCOPY     MASTECTOMY W/ SENTINEL NODE BIOPSY Right 04/28/2020   Procedure: RIGHT MASTECTOMY WITH SENTINEL LYMPH NODE BIOPSY;  Surgeon: [name], MD;  Location: [facility];  Service: General;  Laterality: Right;  PECTORAL BLOCK   MASTOPEXY Left 09/23/2020   Procedure: LEFT BREAST MASTOPEXY;  Surgeon: Wallace Going, DO;  Location: [facility];  Service: Plastics;  Laterality: Left;   REMOVAL OF TISSUE EXPANDER AND PLACEMENT OF IMPLANT Right 09/23/2020   Procedure: REMOVAL OF TISSUE EXPANDER AND PLACEMENT OF IMPLANT;  Surgeon: Wallace Going, DO;  Location: [facility];  Service: Plastics;  Laterality: Right;  90 min   UMBILICAL HERNIA REPAIR     --as a child    Social History: Social History   Socioeconomic History   Marital status: Single    Spouse name: Not on file   Number of children: 0   Years of education: Not on file   Highest education level: Not on file  Occupational History   Not on file  Tobacco Use   Smoking status: Never   Smokeless tobacco: Never  Vaping Use   Vaping Use: Never used  Substance and Sexual Activity   Alcohol use: Yes    Comment: 1 drink per month   Drug use: Never   Sexual activity: Yes    Partners: Male    Birth control/protection: Condom    Comment: condoms everytime, not sexually active since 4/22  Other Topics Concern   Not on file  Social History Narrative   -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 Breast ca--deceased CHF   Breast cancer Cousin 70       maternal first cousin died Breast ca age 17   Hyperlipidemia Father    HIV/AIDS Maternal Uncle    Cancer Paternal Uncle        NOS - needed BMT   Cancer Maternal Grandmother 22       adrenal gland cancer   Breast cancer Paternal Grandmother  dx in her mid to late 50s   Aneurysm Maternal Aunt 26       Brain   Kidney disease Maternal Uncle    Cancer Other        2 maternal great uncles with cancer NOS   Breast cancer Maternal Aunt 72       Breast cancer   Breast cancer Maternal Aunt 72    Review of Systems: Review of Systems  Constitutional:  Positive for malaise/fatigue. Negative for chills and fever.  Genitourinary:  Negative for dysuria.   Physical Exam: Vital Signs BP 122/80 (BP Location: Left Arm, Patient Position: Sitting, Cuff Size: Large)   Pulse 75   Ht 5\' 9"  (1.753 m)   Wt 227 lb (103 kg)   LMP 01/05/2021   SpO2 98%   BMI 33.52 kg/m   Physical Exam  Constitutional:      General: Not in acute distress.    Appearance: Normal appearance. Not ill-appearing.  HENT:     Head: Normocephalic and atraumatic.  Eyes:     Pupils: Pupils are equal, round Neck:     Musculoskeletal: Normal range of motion.  Cardiovascular:     Rate and Rhythm: Normal rate    Pulses: Normal pulses.  Pulmonary:     Effort: Pulmonary effort is normal. No respiratory distress.  Abdominal:     General: Abdomen is flat. There is no distension.  Musculoskeletal: Normal range of motion.  Ambulatory.  No lower extremity swelling or edema. Skin:    General: Skin is warm and dry.     Findings: No erythema or rash.  Neurological:     General: No focal deficit present.     Mental Status: Alert and oriented to person, place, and time. Mental status is at baseline.      Motor: No weakness.  Psychiatric:        Mood and Affect: Mood normal.        Behavior: Behavior normal.    Assessment/Plan: The patient is scheduled for Lipo filling right breast 02/17/2021 with Dr. Marla Roe.  Risks, benefits, and alternatives of procedure discussed, questions answered and consent obtained.    Smoking Status: Non-smoker. Last Mammogram: 02/2020.  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  No evidence of malignancy in left breast.  Caprini Score: 6, high; Risk Factors include: Age, BMI greater than 25, history of breast cancer, and length of planned surgery. Recommendation for mechanical prophylaxis. Encourage early ambulation.   Pictures obtained: 11/17/2020  Post-op Rx sent to pharmacy: Norco, Keflex, Zofran  Patient was provided with the General Surgical Risk consent document and Pain Medication Agreement prior to their appointment.  They had adequate time to read through the risk consent documents and Pain Medication Agreement. We also discussed them in person together during this preop appointment. All of their questions were answered to their satisfaction.  Recommended calling if they have any further questions.  Risk consent form and Pain Medication Agreement to be scanned into patient's chart.  The risks that can be encountered with and after liposuction were discussed and include the following but no limited to these:  Asymmetry, fluid accumulation, firmness of the area, fat necrosis with death of fat tissue, bleeding, infection, delayed healing, anesthesia risks, skin sensation changes, injury to structures including nerves, blood vessels, and muscles which may be temporary or permanent, allergies to tape, suture materials and glues, blood products, topical preparations or injected agents, skin and contour irregularities, skin discoloration and  swelling, deep vein thrombosis, cardiac and pulmonary complications, pain, which  may persist, persistent pain, recurrence of the lesion, poor healing of the incision, possible need for revisional surgery or staged procedures. Thiere can also be persistent swelling, poor wound healing, rippling or loose skin, worsening of cellulite, swelling, and thermal burn or heat injury from ultrasound with the ultrasound-assisted lipoplasty technique. Any change in weight fluctuations can alter the outcome.   Electronically signed by: Krista Blue, PA-C 01/28/2021 2:28 PM

## 2021-01-28 NOTE — H&P (View-Only) (Signed)
Patient ID: Gail Erickson, female    DOB: 1975-05-02, 45 y.o.   MRN: [id number]  Chief Complaint  Patient presents with   Pre-op Exam      ICD-10-CM   1. Malignant neoplasm of overlapping sites of right breast [facility])  C50.811        History of Present Illness: Gail Erickson is a 45 y.o.  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  She presents for preoperative evaluation for upcoming procedure, Lipo filling right breast, scheduled for 02/17/2021 with Dr. Marla Roe.  The patient has not had problems with anesthesia.  She is currently on tamoxifen for maintenance.  She reports that she previously had to hold her tamoxifen 2 weeks before and after surgery during her implant exchange, but was uncertain as to whether or not she would need to hold it for the surgery.  Will verify with oncology that it is okay to hold.  She reports that she is prediabetic, most recent A1c was reportedly 5.6.  She tells me that she has been feeling fatigued recently and is currently being treated with Bactrim for possible UTI.  She reports that cultures are going to result shortly and that she will let us know if there is anything concerning.  She denies any personal or family history of blood clots or clotting disorder.  Message oncologist about holding tamoxifen 2 weeks before and after surgery.  She sees them the week before surgery.  Recently prescribed Bactrim for possible UTI.  Tired/fatigued.  Afebrile.  Summary of Previous Visit: She was last seen via video encounter 12/10/2020.  At that time, it was noted that she has an ultrahigh profile 650 cc gel implant.  She is very pleased with the left breast, but had asymmetry with volume loss of medial and superior medial aspects of right breast.  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  Discussed 2 weeks off of work given how sore she will be from abdominal liposuction.  PMH  Significant for: Right-sided breast cancer s/p reconstruction, HLD.   Past Medical History: Allergies: No Known Allergies  Current Medications:  Current Outpatient Medications:    Ascorbic Acid (VITAMIN C) 1000 MG tablet, Take 1,000 mg by mouth daily., Disp: , Rfl:    Aspirin-Acetaminophen-Caffeine (EXCEDRIN MIGRAINE PO), Take 1 tablet by mouth as needed., Disp: , Rfl:    ibuprofen (ADVIL) 600 MG tablet, Take 1 tablet (600 mg total) by mouth every 8 (eight) hours as needed for moderate pain. For use AFTER surgery, Disp: 30 tablet, Rfl: 0   Multiple Vitamin (MULTIVITAMIN) tablet, Take 1 tablet by mouth daily., Disp: , Rfl:    Omega-3 Fatty Acids (FISH OIL) 1000 MG CAPS, Take by mouth., Disp: , Rfl:    Probiotic Product (PROBIOTIC-10 PO), Take 1 tablet by mouth., Disp: , Rfl:    sulfamethoxazole-trimethoprim (BACTRIM DS) 800-160 MG tablet, Take 1 tablet by mouth 2 (two) times daily. One PO BID x 3 days, Disp: 6 tablet, Rfl: 0   tamoxifen (NOLVADEX) 20 MG tablet, TAKE 1 TABLET BY MOUTH  DAILY, Disp: 90 tablet, Rfl: 3   VITAMIN D PO, Take 1,000 Int'l Units by mouth., Disp: , Rfl:   Past Medical Problems: Past Medical History:  Diagnosis Date   Abnormal Pap smear of cervix 06-02-14   LGSIL:neg HR HPV (1st abn. 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   Migraine without aura    Pre-diabetes    Prediabetes 2021   sees Dr.Balan   Vitamin D deficiency 05/21/15   level = 10    Past Surgical History: Past Surgical History:  Procedure Laterality Date   BREAST RECONSTRUCTION WITH PLACEMENT OF TISSUE EXPANDER AND FLEX HD (ACELLULAR HYDRATED DERMIS) Right 04/28/2020   Procedure: IMMEDIATE RIGHT BREAST RECONSTRUCTION WITH PLACEMENT OF TISSUE EXPANDER AND FLEX HD (ACELLULAR HYDRATED DERMIS);  Surgeon: Wallace Going, DO;   Location: [facility];  Service: Plastics;  Laterality: Right;   COLPOSCOPY     MASTECTOMY W/ SENTINEL NODE BIOPSY Right 04/28/2020   Procedure: RIGHT MASTECTOMY WITH SENTINEL LYMPH NODE BIOPSY;  Surgeon: [name], MD;  Location: [facility];  Service: General;  Laterality: Right;  PECTORAL BLOCK   MASTOPEXY Left 09/23/2020   Procedure: LEFT BREAST MASTOPEXY;  Surgeon: Wallace Going, DO;  Location: [facility];  Service: Plastics;  Laterality: Left;   REMOVAL OF TISSUE EXPANDER AND PLACEMENT OF IMPLANT Right 09/23/2020   Procedure: REMOVAL OF TISSUE EXPANDER AND PLACEMENT OF IMPLANT;  Surgeon: Wallace Going, DO;  Location: [facility];  Service: Plastics;  Laterality: Right;  90 min   UMBILICAL HERNIA REPAIR     --as a child    Social History: Social History   Socioeconomic History   Marital status: Single    Spouse name: Not on file   Number of children: 0   Years of education: Not on file   Highest education level: Not on file  Occupational History   Not on file  Tobacco Use   Smoking status: Never   Smokeless tobacco: Never  Vaping Use   Vaping Use: Never used  Substance and Sexual Activity   Alcohol use: Yes    Comment: 1 drink per month   Drug use: Never   Sexual activity: Yes    Partners: Male    Birth control/protection: Condom    Comment: condoms everytime, not sexually active since 4/22  Other Topics Concern   Not on file  Social History Narrative   -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 [employer]   Social Determinants of Health   Financial Resource Strain: Not on file  Food Insecurity: Not on file  Transportation Needs: Not on file  Physical Activity: Not on file  Stress: Not on file  Social Connections: Not on file  Intimate Partner Violence: Not on file    Family History: Family History  Problem Relation Age of Onset   Breast cancer Mother 60   Diabetes  Mother    Hypertension Mother    Hyperlipidemia Mother    Thyroid disease Mother    Transient ischemic attack Mother    Breast cancer Paternal Aunt 58       bil. Breast ca--deceased CHF   Breast cancer Cousin 59       maternal first cousin died Breast ca age 12   Hyperlipidemia Father    HIV/AIDS Maternal Uncle    Cancer Paternal Uncle        NOS - needed BMT   Cancer Maternal Grandmother 36       adrenal gland cancer   Breast cancer Paternal Grandmother  dx in her mid to late 50s   Aneurysm Maternal Aunt 26       Brain   Kidney disease Maternal Uncle    Cancer Other        2 maternal great uncles with cancer NOS   Breast cancer Maternal Aunt 72       Breast cancer   Breast cancer Maternal Aunt 72    Review of Systems: Review of Systems  Constitutional:  Positive for malaise/fatigue. Negative for chills and fever.  Genitourinary:  Negative for dysuria.   Physical Exam: Vital Signs BP 122/80 (BP Location: Left Arm, Patient Position: Sitting, Cuff Size: Large)   Pulse 75   Ht 5\' 9"  (1.753 m)   Wt 227 lb (103 kg)   LMP 01/05/2021   SpO2 98%   BMI 33.52 kg/m   Physical Exam  Constitutional:      General: Not in acute distress.    Appearance: Normal appearance. Not ill-appearing.  HENT:     Head: Normocephalic and atraumatic.  Eyes:     Pupils: Pupils are equal, round Neck:     Musculoskeletal: Normal range of motion.  Cardiovascular:     Rate and Rhythm: Normal rate    Pulses: Normal pulses.  Pulmonary:     Effort: Pulmonary effort is normal. No respiratory distress.  Abdominal:     General: Abdomen is flat. There is no distension.  Musculoskeletal: Normal range of motion.  Ambulatory.  No lower extremity swelling or edema. Skin:    General: Skin is warm and dry.     Findings: No erythema or rash.  Neurological:     General: No focal deficit present.     Mental Status: Alert and oriented to person, place, and time. Mental status is at baseline.      Motor: No weakness.  Psychiatric:        Mood and Affect: Mood normal.        Behavior: Behavior normal.    Assessment/Plan: The patient is scheduled for Lipo filling right breast 02/17/2021 with Dr. Marla Roe.  Risks, benefits, and alternatives of procedure discussed, questions answered and consent obtained.    Smoking Status: Non-smoker. Last Mammogram: 02/2020.  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  No evidence of malignancy in left breast.  Caprini Score: 6, high; Risk Factors include: Age, BMI greater than 25, history of breast cancer, and length of planned surgery. Recommendation for mechanical prophylaxis. Encourage early ambulation.   Pictures obtained: 11/17/2020  Post-op Rx sent to pharmacy: Norco, Keflex, Zofran  Patient was provided with the General Surgical Risk consent document and Pain Medication Agreement prior to their appointment.  They had adequate time to read through the risk consent documents and Pain Medication Agreement. We also discussed them in person together during this preop appointment. All of their questions were answered to their satisfaction.  Recommended calling if they have any further questions.  Risk consent form and Pain Medication Agreement to be scanned into patient's chart.  The risks that can be encountered with and after liposuction were discussed and include the following but no limited to these:  Asymmetry, fluid accumulation, firmness of the area, fat necrosis with death of fat tissue, bleeding, infection, delayed healing, anesthesia risks, skin sensation changes, injury to structures including nerves, blood vessels, and muscles which may be temporary or permanent, allergies to tape, suture materials and glues, blood products, topical preparations or injected agents, skin and contour irregularities, skin discoloration and  swelling, deep vein thrombosis, cardiac and pulmonary complications, pain, which  may persist, persistent pain, recurrence of the lesion, poor healing of the incision, possible need for revisional surgery or staged procedures. Thiere can also be persistent swelling, poor wound healing, rippling or loose skin, worsening of cellulite, swelling, and thermal burn or heat injury from ultrasound with the ultrasound-assisted lipoplasty technique. Any change in weight fluctuations can alter the outcome.   Electronically signed by: Krista Blue, PA-C 01/28/2021 2:28 PM

## 2021-01-31 ENCOUNTER — Other Ambulatory Visit: Payer: Self-pay | Admitting: Nurse Practitioner

## 2021-01-31 ENCOUNTER — Other Ambulatory Visit: Payer: Self-pay

## 2021-01-31 ENCOUNTER — Ambulatory Visit
Admission: RE | Admit: 2021-01-31 | Discharge: 2021-01-31 | Disposition: A | Discharge status: Home / Self Care | Payer: BC Managed Care – PPO | Source: Ambulatory Visit | Attending: Nurse Practitioner | Admitting: Nurse Practitioner

## 2021-01-31 DIAGNOSIS — C50811 Malignant neoplasm of overlapping sites of right female breast: Secondary | ICD-10-CM

## 2021-01-31 DIAGNOSIS — Z1231 Encounter for screening mammogram for malignant neoplasm of breast: Secondary | ICD-10-CM | POA: Diagnosis not present

## 2021-02-09 ENCOUNTER — Encounter (HOSPITAL_BASED_OUTPATIENT_CLINIC_OR_DEPARTMENT_OTHER): Payer: Self-pay | Admitting: Plastic Surgery

## 2021-02-09 ENCOUNTER — Other Ambulatory Visit: Payer: Self-pay

## 2021-02-11 ENCOUNTER — Inpatient Hospital Stay: Payer: BC Managed Care – PPO | Attending: Hematology | Admitting: Hematology

## 2021-02-11 ENCOUNTER — Inpatient Hospital Stay: Payer: BC Managed Care – PPO

## 2021-02-11 ENCOUNTER — Other Ambulatory Visit: Payer: Self-pay

## 2021-02-11 VITALS — BP 127/79 | HR 68 | Temp 98.7°F | Resp 18 | Ht 69.0 in | Wt 230.8 lb

## 2021-02-11 DIAGNOSIS — C50811 Malignant neoplasm of overlapping sites of right female breast: Secondary | ICD-10-CM | POA: Insufficient documentation

## 2021-02-11 DIAGNOSIS — Z79810 Long term (current) use of selective estrogen receptor modulators (SERMs): Secondary | ICD-10-CM | POA: Diagnosis not present

## 2021-02-11 DIAGNOSIS — R7303 Prediabetes: Secondary | ICD-10-CM | POA: Insufficient documentation

## 2021-02-11 DIAGNOSIS — Z170 Estrogen receptor positive status [ER+]: Secondary | ICD-10-CM | POA: Insufficient documentation

## 2021-02-11 LAB — CMP (CANCER CENTER ONLY)
ALT: 59 U/L — ABNORMAL HIGH (ref 0–44)
AST: 28 U/L (ref 15–41)
Albumin: 4.1 g/dL (ref 3.5–5.0)
Alkaline Phosphatase: 40 U/L (ref 38–126)
Anion gap: 9 (ref 5–15)
BUN: 8 mg/dL (ref 6–20)
CO2: 24 mmol/L (ref 22–32)
Calcium: 9 mg/dL (ref 8.9–10.3)
Chloride: 106 mmol/L (ref 98–111)
Creatinine: 1 mg/dL (ref 0.44–1.00)
GFR, Estimated: >60 mL/min (ref >60)
Glucose, Bld: 86 mg/dL (ref 70–99)
Potassium: 3.7 mmol/L (ref 3.5–5.1)
Sodium: 139 mmol/L (ref 135–145)
Total Bilirubin: 0.9 mg/dL (ref 0.3–1.2)
Total Protein: 7.3 g/dL (ref 6.5–8.1)

## 2021-02-11 LAB — CBC WITH DIFFERENTIAL (CANCER CENTER ONLY)
Abs Immature Granulocytes: 0.01 10*3/uL (ref 0.00–0.07)
Basophils Absolute: 0.1 10*3/uL (ref 0.0–0.1)
Basophils Relative: 1 %
Eosinophils Absolute: 0.1 10*3/uL (ref 0.0–0.5)
Eosinophils Relative: 2 %
HCT: 34 % — ABNORMAL LOW (ref 36.0–46.0)
Hemoglobin: 11.4 g/dL — ABNORMAL LOW (ref 12.0–15.0)
Immature Granulocytes: 0 %
Lymphocytes Relative: 42 %
Lymphs Abs: 1.7 10*3/uL (ref 0.7–4.0)
MCH: 28.9 pg (ref 26.0–34.0)
MCHC: 33.5 g/dL (ref 30.0–36.0)
MCV: 86.1 fL (ref 80.0–100.0)
Monocytes Absolute: 0.4 10*3/uL (ref 0.1–1.0)
Monocytes Relative: 9 %
Neutro Abs: 1.9 10*3/uL (ref 1.7–7.7)
Neutrophils Relative %: 46 %
Platelet Count: 295 10*3/uL (ref 150–400)
RBC: 3.95 MIL/uL (ref 3.87–5.11)
RDW: 13.2 % (ref 11.5–15.5)
WBC Count: 4.1 10*3/uL (ref 4.0–10.5)
nRBC: 0 % (ref 0.0–0.2)

## 2021-02-11 MED ORDER — TAMOXIFEN CITRATE 20 MG PO TABS: 20.0000 mg | ORAL_TABLET | Freq: Every day | ORAL | 1 refills | Status: DC

## 2021-02-11 NOTE — Progress Notes (Signed)
[place]   [phone] Fax:([phone]) [phone]   Clinic Follow up Note   Patient Care Team: Noreene Larsson, NP as PCP - General (Nurse Practitioner) [name], MD as Consulting Physician (Obstetrics and Gynecology) [name], MD as Consulting Physician (General Surgery) [name], MD as Consulting Physician (Hematology) [name], RN as Oncology Nurse Navigator [name], RN as Oncology Nurse Navigator Alla Feeling, NP as Nurse Practitioner (Nurse Practitioner)  Date of Service:  02/11/2021  CHIEF COMPLAINT: f/u of right breast cancer  CURRENT THERAPY:  Tamoxifen $RemoveBe'20mg'OGANZsNTK$  once daily starting 07/10/20  ASSESSMENT & PLAN:  Gail Erickson is a 45 y.o. female with   1. 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 Separate right Axillary LN biopsy was negative.  -She underwent right mastectomy with Dr Marlou Starks and Dr Marla Roe on 04/28/20. Surgical path showed invasive ductal carcinoma was 0.5 cm, grade 2, margins were negative, 1 lymph nodes were negative.  Her surgical stage is IA.  -Given the T1a tumor, she does not need Oncotype, adjuvant chemotherapy or postmastectomy radiation.  -I started her on Tamoxifen $RemoveBefo'20mg'NzQqNaBsNKx$  once daily on 07/10/20. She is tolerating with hot flashes.  -she had definitive reconstructive surgery on 09/23/20 with Dr. Marla Roe. She is scheduled to undergo lipofilling for symmetry on 02/17/21. She has held the tamoxifen since 02/08/21. I advised her to hold Tamoxifen 2 weeks before and 2 weeks after date of surgery to reduce her risk of thrombosis. -She is clinically doing well. Lab reviewed, her CBC and CMP are within normal limits except hgb 11.4,  HCT 24, and ALT 59. Her physical exam was unremarkable. There is no clinical concern for recurrence.  -I discussed the option of screening MRI. She would like to proceed given she does not receive mammography on the right reconstructed breast. -lab and f/u in 6 months   2. Family history, Genetic testing negative for BRCA 1/2 -She has an extensive family history of cancer, mainly breast cancer on both sides of her family.  -Her 07/26/20 Pap Smear showed abnormal cells on path, but were not cancerous. She is HPV negative. She will continue to f/u with her Gyn.    3. Pre-Diabetic, Stress -She is seen by Endocrinologist. She was recently recommended to start a weight loss medication. She has not yet started due to upcoming reconstruction surgery. -she expressed some recent stress/anxiety, unsure of exact cause. She previously saw a therapist but recalls that we have a Education officer, museum and therapist and hopes to talk to someone specifically about cancer.   4. COVID (+) in 06/2020  -She had mild to moderate symptoms and had treatment with oral medicine. She has recovered fully now.      PLAN:  -Proceed with reconstruction surgery on 02/17/21. She will hold Tamoxifen 1 week before and 2 weeks afterward.  -Continue Tamoxifen -Referral to social worker for counseling -screening breast MRI in 5-6 months -Lab and F/u with me in 6 months    No problem-specific Assessment & Plan notes found for this encounter.   SUMMARY OF ONCOLOGIC HISTORY: Oncology History Overview Note  Cancer Staging Malignant neoplasm of overlapping sites of right breast [facility]) Staging form: Breast, AJCC 8th Edition - Clinical  stage from 02/25/2020: Stage IIB (cT3, cN0, cM0, G3, ER+, PR+, HER2-) - Signed by [name], MD on 02/25/2020 - Pathologic stage from 04/28/2020: Stage IA (pT1a, pN0, cM0, G2, ER+, PR+, HER2-) - Signed by [name], MD on 05/06/2020 Histologic grading system: 3 grade system    Malignant neoplasm of  overlapping sites of right breast ([place])  01/30/2020 Mammogram   Mammogram 01/30/20  IMPRESSION: 1. 7.3 x 5.7 x 3.0 cm area of indeterminate calcifications in medial right breast, involving the lower inner and upper inner quadrants, these extend from the retroareolar region posteriorly, in a segmental distribution, suspicious for DCIS. 2. Two probably benign left breast masses and 2 probably benign left axillary lymph nodes with borderline cortical thickening. 3. Benign right breast cyst.   02/13/2020 Initial Biopsy   Diagnosis 02/13/20 1. Breast, right, needle core biopsy, posterior extent - DUCTAL CARCINOMA IN SITU WITH CALCIFICATIONS AND NECROSIS - SEE COMMENT 2. Breast, right, needle core biopsy, mid-anterior extent - INVASIVE DUCTAL CARCINOMA - DUCTAL CARCINOMA IN SITU - SEE COMMENT Microscopic Comment 1. Based on the biopsy, the ductal carcinoma in situ has a solid pattern, high nuclear grade and measures 0.5 cm in greatest linear extent. Prognostic markers (ER/PR) are pending and will be reported in an addendum. Dr. Jeannie Done reviewed the case and agrees with the above diagnosis. These results were called to [facility] on February 16, 2020. 2. Based on the biopsy, the carcinoma appears Nottingham grade 3 of 3 and measures 0.15 cm in greatest linear extent. Prognostic markers (ER/PR/ki-67/HER2) are pending and will be reported in an addendum.   02/13/2020 Receptors her2    1. PROGNOSTIC INDICATORS Results: IMMUNOHISTOCHEMICAL AND MORPHOMETRIC ANALYSIS PERFORMED MANUALLY Estrogen Receptor: >95%, POSITIVE, MODERATE-STRONG STAINING INTENSITY Progesterone Receptor: 5%, POSITIVE, MODERATE-STRONG STAINING INTENSITY  2. PROGNOSTIC INDICATORS Results: IMMUNOHISTOCHEMICAL AND MORPHOMETRIC ANALYSIS PERFORMED MANUALLY The tumor cells are NEGATIVE for Her2 (0). Estrogen Receptor: >95%, POSITIVE, STRONG STAINING INTENSITY Progesterone Receptor: 60%, POSITIVE, STRONG  STAINING INTENSITY Proliferation Marker Ki67: 1%   02/21/2020 Breast MRI   Breast MRI 02/21/20  IMPRESSION: 1. Involving the sites of biopsy proven DCIS in the medial right breast there is a large area of ill-defined non mass enhancement middle to anterior depth, somewhat difficult to measure due to irregular margins and background enhancement but measures at least 6.3 x 3.3 x 2.0 cm. 2. No MRI evidence of malignancy in the left breast. 3. No axillary adenopathy.   02/25/2020 Initial Diagnosis   Malignant neoplasm of overlapping sites of right breast ([place])   02/25/2020 Cancer Staging   Staging form: Breast, AJCC 8th Edition - Clinical stage from 02/25/2020: Stage IIB (cT3, cN0, cM0, G3, ER+, PR+, HER2-) - Signed by [name], MD on 02/25/2020    02/27/2020 Pathology Results   Diagnosis Lymph node, needle/core biopsy, axillary - NO CARCINOMA IDENTIFIED IN NODAL TISSUE Microscopic Comment These results were called to [facility] on March 09, 2020.   04/28/2020 Surgery   RIGHT MASTECTOMY WITH SENTINEL LYMPH NODE BIOPSY by DR Marlou Starks  IMMEDIATE RIGHT BREAST RECONSTRUCTION WITH PLACEMENT OF TISSUE EXPANDER AND FLEX HD (ACELLULAR HYDRATED DERMIS) by Dr Marla Roe   04/28/2020 Pathology Results   FINAL MICROSCOPIC DIAGNOSIS:   A. 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 LYMPH NODE, RIGHT AXILLARY, SENTINEL, EXCISION:  -  No carcinoma identified in one lymph node (0/1)    04/28/2020 Cancer Staging   Staging form: Breast, AJCC 8th Edition - Pathologic stage from 04/28/2020: Stage IA (pT1a, pN0, cM0, G2, ER+, PR+, HER2-) - Signed by [name], MD on 05/06/2020 Histologic grading system: 3 grade  system    07/10/2020 -  Anti-estrogen oral therapy   Tamoxifen $RemoveBe'20mg'neDItudQK$  once daily starting 07/10/20   11/04/2020 Survivorship   SCP delivered by [name], NP      INTERVAL HISTORY:  BATYA CITRON is here for a follow up of breast cancer. She was last seen by me on 08/06/20 and in survivorship in the interim. She presents to the clinic alone. She reports she was recently prescribed a weight loss medication due to concerns for pre-diabetes. She notes she has not started this due to upcoming reconstruction. She reports she has hip and knee pain.   All other systems were reviewed with the patient and are negative.  MEDICAL HISTORY:  Past Medical History:  Diagnosis Date   Abnormal Pap smear of cervix 06/02/2014   LGSIL:neg HR HPV (1st abn. 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   Migraine without aura    Pre-diabetes    Prediabetes 2021   sees Dr.Balan   Vitamin D deficiency 05/21/2015   level = 10    SURGICAL HISTORY: Past Surgical History:  Procedure Laterality Date   BREAST RECONSTRUCTION WITH PLACEMENT OF TISSUE EXPANDER AND FLEX HD (ACELLULAR HYDRATED DERMIS) Right 04/28/2020   Procedure: IMMEDIATE RIGHT BREAST RECONSTRUCTION WITH PLACEMENT OF TISSUE EXPANDER AND FLEX HD (ACELLULAR HYDRATED DERMIS);  Surgeon: Wallace Going, DO;  Location: [facility];  Service: Plastics;  Laterality: Right;   COLPOSCOPY     MASTECTOMY W/ SENTINEL NODE BIOPSY Right 04/28/2020   Procedure: RIGHT MASTECTOMY WITH SENTINEL LYMPH NODE BIOPSY;  Surgeon: [name], MD;  Location: [facility];  Service: General;  Laterality: Right;  PECTORAL BLOCK   MASTOPEXY Left 09/23/2020   Procedure: LEFT BREAST MASTOPEXY;  Surgeon: Wallace Going, DO;  Location: [facility];   Service: Plastics;  Laterality: Left;   REMOVAL OF TISSUE EXPANDER AND PLACEMENT OF IMPLANT Right 09/23/2020   Procedure: REMOVAL OF TISSUE EXPANDER AND PLACEMENT OF IMPLANT;  Surgeon: Wallace Going, DO;  Location: [facility];  Service: Plastics;  Laterality: Right;  90 min   UMBILICAL HERNIA REPAIR     --as a child    I have reviewed the social history and family history with the patient and they are unchanged from previous note.  ALLERGIES:  has No Known Allergies.  MEDICATIONS:  Current Outpatient Medications  Medication Sig Dispense Refill   Ascorbic Acid (VITAMIN C) 1000 MG tablet Take 1,000 mg by mouth daily.     Aspirin-Acetaminophen-Caffeine (EXCEDRIN MIGRAINE PO) Take 1 tablet by mouth as needed.     ibuprofen (ADVIL) 600 MG tablet Take 1 tablet (600 mg total) by mouth every 8 (eight) hours as needed for moderate pain. For use AFTER surgery 30 tablet 0  Multiple Vitamin (MULTIVITAMIN) tablet Take 1 tablet by mouth daily.     Omega-3 Fatty Acids (FISH OIL) 1000 MG CAPS Take by mouth.     ondansetron (ZOFRAN ODT) 4 MG disintegrating tablet Take 1 tablet (4 mg total) by mouth every 8 (eight) hours as needed for nausea or vomiting. 20 tablet 0   Probiotic Product (PROBIOTIC-10 PO) Take 1 tablet by mouth.     tamoxifen (NOLVADEX) 20 MG tablet TAKE 1 TABLET BY MOUTH  DAILY 90 tablet 3   VITAMIN D PO Take 1,000 Int'l Units by mouth.     No current facility-administered medications for this visit.    PHYSICAL EXAMINATION: ECOG PERFORMANCE STATUS: 1 - Symptomatic but completely ambulatory  Vitals:   02/11/21 1549  BP: 127/79  Pulse: 68  Resp: 18  Temp: 98.7 F (37.1 C)  SpO2: 99%   Wt Readings from Last 3 Encounters:  02/11/21 230 lb 12.8 oz (104.7 kg)  01/28/21 227 lb (103 kg)  01/26/21 229 lb (103.9 kg)     GENERAL:alert, no distress and comfortable SKIN: skin color, texture, turgor are normal, no rashes or significant lesions EYES: normal,  Conjunctiva are pink and non-injected, sclera clear  NECK: supple, thyroid normal size, non-tender, without nodularity LYMPH:  no palpable lymphadenopathy in the cervical, axillary  LUNGS: clear to auscultation and percussion with normal breathing effort HEART: regular rate & rhythm and no murmurs and no lower extremity edema ABDOMEN:abdomen soft, non-tender and normal bowel sounds Musculoskeletal:no cyanosis of digits and no clubbing  NEURO: alert & oriented x 3 with fluent speech, no focal motor/sensory deficits BREAST: Status post right mastectomy and reconstruction.  No palpable mass, nodules or adenopathy bilaterally. Breast exam benign.   LABORATORY DATA:  I have reviewed the data as listed CBC Latest Ref Rng & Units 02/11/2021 08/06/2020 07/26/2020  WBC 4.0 - 10.5 K/uL 4.1 3.9(L) 4.5  Hemoglobin 12.0 - 15.0 g/dL 11.4(L) 11.4(L) 11.8  Hematocrit 36.0 - 46.0 % 34.0(L) 32.7(L) 35.1  Platelets 150 - 400 K/uL 295 271 286     CMP Latest Ref Rng & Units 08/06/2020 07/26/2020 02/26/2020  Glucose 70 - 99 mg/dL 103(H) 86 96  BUN 6 - 20 mg/dL $Remove'8 11 13  'VpQbBLG$ Creatinine 0.44 - 1.00 mg/dL 0.89 0.85 1.03(H)  Sodium 135 - 145 mmol/L 143 140 141  Potassium 3.5 - 5.1 mmol/L 4.1 3.9 4.1  Chloride 98 - 111 mmol/L 109 106 107  CO2 22 - 32 mmol/L $RemoveB'24 26 26  'RdNPmmBo$ Calcium 8.9 - 10.3 mg/dL 8.8(L) 9.5 9.3  Total Protein 6.5 - 8.1 g/dL 6.9 7.3 7.6  Total Bilirubin 0.3 - 1.2 mg/dL 0.7 0.6 1.1  Alkaline Phos 38 - 126 U/L 45 - 57  AST 15 - 41 U/L $Remo'18 17 18  'OjOTG$ ALT 0 - 44 U/L $Remo'14 14 18      'WDonH$ RADIOGRAPHIC STUDIES: I have personally reviewed the radiological images as listed and agreed with the findings in the report. No results found.    Orders Placed This Encounter  Procedures   MR BREAST BILATERAL W WO CONTRAST INC CAD    Standing Status:   Future    Standing Expiration Date:   02/11/2022    Order Specific Question:   If indicated for the ordered procedure, I authorize the administration of contrast media per  Radiology protocol    Answer:   Yes    Order Specific Question:   What is the patient's sedation requirement?    Answer:   No  Sedation    Order Specific Question:   Does the patient have a pacemaker or implanted devices?    Answer:   No    Order Specific Question:   Radiology Contrast Protocol - do NOT remove file path    Answer:   [email or web address]    Order Specific Question:   Preferred imaging location?    Answer:   [facility] (table limit-550lbs)   Ambulatory referral to Social Work    Referral Priority:   Routine    Referral Type:   Consultation    Referral Reason:   Specialty Services Required    Number of Visits Requested:   1   All questions were answered. The patient knows to call the clinic with any problems, questions or concerns. No barriers to learning was detected. The total time spent in the appointment was 30 minutes.     [name], MD 02/11/2021   I, Wilburn Mylar, am acting as scribe for [name], MD.   I have reviewed the above documentation for accuracy and completeness, and I agree with the above.

## 2021-02-13 ENCOUNTER — Encounter: Payer: Self-pay | Admitting: Hematology

## 2021-02-15 DIAGNOSIS — F4320 Adjustment disorder, unspecified: Secondary | ICD-10-CM | POA: Diagnosis not present

## 2021-02-17 ENCOUNTER — Ambulatory Visit (HOSPITAL_BASED_OUTPATIENT_CLINIC_OR_DEPARTMENT_OTHER): Payer: BC Managed Care – PPO | Admitting: Anesthesiology

## 2021-02-17 ENCOUNTER — Encounter (HOSPITAL_BASED_OUTPATIENT_CLINIC_OR_DEPARTMENT_OTHER)
Admission: RE | Disposition: A | Discharge status: Home / Self Care | Payer: Self-pay | Source: Ambulatory Visit | Attending: Plastic Surgery

## 2021-02-17 ENCOUNTER — Encounter (HOSPITAL_BASED_OUTPATIENT_CLINIC_OR_DEPARTMENT_OTHER): Payer: Self-pay | Admitting: Plastic Surgery

## 2021-02-17 ENCOUNTER — Other Ambulatory Visit: Payer: Self-pay

## 2021-02-17 ENCOUNTER — Ambulatory Visit (HOSPITAL_BASED_OUTPATIENT_CLINIC_OR_DEPARTMENT_OTHER)
Admission: RE | Admit: 2021-02-17 | Discharge: 2021-02-17 | Disposition: A | Discharge status: Home / Self Care | Payer: BC Managed Care – PPO | Source: Ambulatory Visit | Attending: Plastic Surgery | Admitting: Plastic Surgery

## 2021-02-17 DIAGNOSIS — Z853 Personal history of malignant neoplasm of breast: Secondary | ICD-10-CM | POA: Insufficient documentation

## 2021-02-17 DIAGNOSIS — Z6833 Body mass index (BMI) 33.0-33.9, adult: Secondary | ICD-10-CM | POA: Diagnosis not present

## 2021-02-17 DIAGNOSIS — D649 Anemia, unspecified: Secondary | ICD-10-CM | POA: Insufficient documentation

## 2021-02-17 DIAGNOSIS — E669 Obesity, unspecified: Secondary | ICD-10-CM | POA: Insufficient documentation

## 2021-02-17 DIAGNOSIS — Z8639 Personal history of other endocrine, nutritional and metabolic disease: Secondary | ICD-10-CM | POA: Diagnosis not present

## 2021-02-17 DIAGNOSIS — R519 Headache, unspecified: Secondary | ICD-10-CM | POA: Insufficient documentation

## 2021-02-17 DIAGNOSIS — Z9011 Acquired absence of right breast and nipple: Secondary | ICD-10-CM | POA: Insufficient documentation

## 2021-02-17 DIAGNOSIS — N6489 Other specified disorders of breast: Secondary | ICD-10-CM | POA: Diagnosis not present

## 2021-02-17 DIAGNOSIS — Z79810 Long term (current) use of selective estrogen receptor modulators (SERMs): Secondary | ICD-10-CM | POA: Diagnosis not present

## 2021-02-17 DIAGNOSIS — N651 Disproportion of reconstructed breast: Secondary | ICD-10-CM | POA: Diagnosis not present

## 2021-02-17 HISTORY — PX: LIPOSUCTION WITH LIPOFILLING: SHX6436

## 2021-02-17 HISTORY — DX: Malignant (primary) neoplasm, unspecified: C80.1

## 2021-02-17 LAB — POCT PREGNANCY, URINE: Preg Test, Ur: NEGATIVE

## 2021-02-17 SURGERY — LIPOSUCTION, WITH FAT TRANSFER
Anesthesia: General | Site: Breast | Laterality: Right

## 2021-02-17 MED ORDER — ONDANSETRON HCL 4 MG/2ML IJ SOLN
INTRAMUSCULAR | Status: AC
  Filled 2021-02-17: qty 2

## 2021-02-17 MED ORDER — DEXAMETHASONE SODIUM PHOSPHATE 10 MG/ML IJ SOLN
INTRAMUSCULAR | Status: DC | PRN
  Administered 2021-02-17: 10 mg via INTRAVENOUS

## 2021-02-17 MED ORDER — LIDOCAINE HCL 1 % IJ SOLN
INTRAVENOUS | Status: DC | PRN
  Administered 2021-02-17: 900 mL

## 2021-02-17 MED ORDER — OXYCODONE HCL 5 MG PO TABS: 5.0000 mg | ORAL_TABLET | ORAL | Status: DC | PRN

## 2021-02-17 MED ORDER — SODIUM CHLORIDE 0.9 % IV SOLN: 250.0000 mL | INTRAVENOUS | Status: DC | PRN

## 2021-02-17 MED ORDER — ACETAMINOPHEN 325 MG RE SUPP: 650.0000 mg | RECTAL | Status: DC | PRN

## 2021-02-17 MED ORDER — DEXMEDETOMIDINE (PRECEDEX) IN NS 20 MCG/5ML (4 MCG/ML) IV SYRINGE
PREFILLED_SYRINGE | INTRAVENOUS | Status: DC | PRN
  Administered 2021-02-17: 4 ug via INTRAVENOUS

## 2021-02-17 MED ORDER — CHLORHEXIDINE GLUCONATE CLOTH 2 % EX PADS: 6.0000 | MEDICATED_PAD | Freq: Once | CUTANEOUS | Status: DC

## 2021-02-17 MED ORDER — OXYCODONE HCL 5 MG/5ML PO SOLN: 5.0000 mg | Freq: Once | ORAL | Status: AC | PRN

## 2021-02-17 MED ORDER — MIDAZOLAM HCL 5 MG/5ML IJ SOLN
INTRAMUSCULAR | Status: DC | PRN
  Administered 2021-02-17 (×2): 1 mg via INTRAVENOUS

## 2021-02-17 MED ORDER — LACTATED RINGERS IV SOLN: INTRAVENOUS | Status: DC

## 2021-02-17 MED ORDER — LIDOCAINE HCL 1 % IJ SOLN
INTRAMUSCULAR | Status: DC | PRN
  Administered 2021-02-17: 60 mg via INTRADERMAL

## 2021-02-17 MED ORDER — HYDROMORPHONE HCL 1 MG/ML IJ SOLN
INTRAMUSCULAR | Status: AC
  Filled 2021-02-17: qty 0.5

## 2021-02-17 MED ORDER — ACETAMINOPHEN 325 MG PO TABS: 650.0000 mg | ORAL_TABLET | ORAL | Status: DC | PRN

## 2021-02-17 MED ORDER — SODIUM CHLORIDE 0.9% FLUSH: 3.0000 mL | Freq: Two times a day (BID) | INTRAVENOUS | Status: DC

## 2021-02-17 MED ORDER — SODIUM CHLORIDE 0.9% FLUSH: 3.0000 mL | INTRAVENOUS | Status: DC | PRN

## 2021-02-17 MED ORDER — MIDAZOLAM HCL 2 MG/2ML IJ SOLN
INTRAMUSCULAR | Status: AC
  Filled 2021-02-17: qty 2

## 2021-02-17 MED ORDER — CEFAZOLIN SODIUM-DEXTROSE 2-4 GM/100ML-% IV SOLN
INTRAVENOUS | Status: AC
  Filled 2021-02-17: qty 100

## 2021-02-17 MED ORDER — DEXAMETHASONE SODIUM PHOSPHATE 10 MG/ML IJ SOLN
INTRAMUSCULAR | Status: AC
  Filled 2021-02-17: qty 1

## 2021-02-17 MED ORDER — PROPOFOL 10 MG/ML IV BOLUS
INTRAVENOUS | Status: AC
  Filled 2021-02-17: qty 20

## 2021-02-17 MED ORDER — ONDANSETRON HCL 4 MG/2ML IJ SOLN
INTRAMUSCULAR | Status: DC | PRN
  Administered 2021-02-17: 4 mg via INTRAVENOUS

## 2021-02-17 MED ORDER — OXYCODONE HCL 5 MG PO TABS
ORAL_TABLET | ORAL | Status: AC
  Filled 2021-02-17: qty 1

## 2021-02-17 MED ORDER — LIDOCAINE 2% (20 MG/ML) 5 ML SYRINGE
INTRAMUSCULAR | Status: AC
  Filled 2021-02-17: qty 5

## 2021-02-17 MED ORDER — ONDANSETRON HCL 4 MG/2ML IJ SOLN: 4.0000 mg | Freq: Once | INTRAMUSCULAR | Status: DC | PRN

## 2021-02-17 MED ORDER — PROPOFOL 10 MG/ML IV BOLUS
INTRAVENOUS | Status: DC | PRN
  Administered 2021-02-17: 160 mg via INTRAVENOUS

## 2021-02-17 MED ORDER — FENTANYL CITRATE (PF) 100 MCG/2ML IJ SOLN
INTRAMUSCULAR | Status: DC | PRN
  Administered 2021-02-17 (×2): 50 ug via INTRAVENOUS

## 2021-02-17 MED ORDER — HYDROMORPHONE HCL 1 MG/ML IJ SOLN
0.2500 mg | INTRAMUSCULAR | Status: DC | PRN
  Administered 2021-02-17 (×4): 0.5 mg via INTRAVENOUS

## 2021-02-17 MED ORDER — LIDOCAINE-EPINEPHRINE 1 %-1:100000 IJ SOLN
INTRAMUSCULAR | Status: DC | PRN
  Administered 2021-02-17: 5 mL

## 2021-02-17 MED ORDER — FENTANYL CITRATE (PF) 100 MCG/2ML IJ SOLN: 25.0000 ug | INTRAMUSCULAR | Status: DC | PRN

## 2021-02-17 MED ORDER — CEFAZOLIN SODIUM-DEXTROSE 2-4 GM/100ML-% IV SOLN
2.0000 g | INTRAVENOUS | Status: AC
  Administered 2021-02-17: 2 g via INTRAVENOUS

## 2021-02-17 MED ORDER — OXYCODONE HCL 5 MG PO TABS
5.0000 mg | ORAL_TABLET | Freq: Once | ORAL | Status: AC | PRN
  Administered 2021-02-17: 5 mg via ORAL

## 2021-02-17 MED ORDER — FENTANYL CITRATE (PF) 100 MCG/2ML IJ SOLN
INTRAMUSCULAR | Status: AC
  Filled 2021-02-17: qty 2

## 2021-02-17 SURGICAL SUPPLY — 46 items
ADH SKN CLS APL DERMABOND .7 (GAUZE/BANDAGES/DRESSINGS) ×1
BINDER ABDOMINAL  9 SM 30-45 (SOFTGOODS)
BINDER ABDOMINAL 10 UNV 27-48 (MISCELLANEOUS) IMPLANT
BINDER ABDOMINAL 12 SM 30-45 (SOFTGOODS) IMPLANT
BINDER ABDOMINAL 9 SM 30-45 (SOFTGOODS) IMPLANT
BINDER BREAST LRG (GAUZE/BANDAGES/DRESSINGS) IMPLANT
BINDER BREAST MEDIUM (GAUZE/BANDAGES/DRESSINGS) IMPLANT
BINDER BREAST XLRG (GAUZE/BANDAGES/DRESSINGS) IMPLANT
BINDER BREAST XXLRG (GAUZE/BANDAGES/DRESSINGS) IMPLANT
BLADE HEX COATED 2.75 (ELECTRODE) IMPLANT
BLADE SURG 15 STRL LF DISP TIS (BLADE) ×1 IMPLANT
BLADE SURG 15 STRL SS (BLADE) ×2
COVER BACK TABLE 60X90IN (DRAPES) ×2 IMPLANT
COVER MAYO STAND STRL (DRAPES) ×2 IMPLANT
DERMABOND ADVANCED (GAUZE/BANDAGES/DRESSINGS) ×1
DERMABOND ADVANCED .7 DNX12 (GAUZE/BANDAGES/DRESSINGS) ×1 IMPLANT
DRAPE LAPAROSCOPIC ABDOMINAL (DRAPES) ×2 IMPLANT
DRSG PAD ABDOMINAL 8X10 ST (GAUZE/BANDAGES/DRESSINGS) ×4 IMPLANT
ELECT REM PT RETURN 9FT ADLT (ELECTROSURGICAL) ×2
ELECTRODE REM PT RTRN 9FT ADLT (ELECTROSURGICAL) ×1 IMPLANT
EXTRACTOR CANIST REVOLVE STRL (CANNISTER) ×2 IMPLANT
GLOVE SURG ENC MOIS LTX SZ6.5 (GLOVE) ×4 IMPLANT
GOWN STRL REUS W/ TWL LRG LVL3 (GOWN DISPOSABLE) ×2 IMPLANT
GOWN STRL REUS W/TWL LRG LVL3 (GOWN DISPOSABLE) ×4
IV LACTATED RINGERS 1000ML (IV SOLUTION) ×4 IMPLANT
LINER CANISTER 1000CC FLEX (MISCELLANEOUS) ×2 IMPLANT
NDL SAFETY ECLIPSE 18X1.5 (NEEDLE) ×1 IMPLANT
NEEDLE HYPO 18GX1.5 SHARP (NEEDLE) ×2
NEEDLE HYPO 25X1 1.5 SAFETY (NEEDLE) IMPLANT
PACK BASIN DAY SURGERY FS (CUSTOM PROCEDURE TRAY) ×2 IMPLANT
PAD ALCOHOL SWAB (MISCELLANEOUS) ×2 IMPLANT
PENCIL SMOKE EVACUATOR (MISCELLANEOUS) IMPLANT
SLEEVE SCD COMPRESS KNEE MED (STOCKING) ×2 IMPLANT
SPONGE T-LAP 18X18 ~~LOC~~+RFID (SPONGE) ×2 IMPLANT
SUT MNCRL AB 4-0 PS2 18 (SUTURE) IMPLANT
SUT MON AB 5-0 PS2 18 (SUTURE) ×4 IMPLANT
SYR 10ML LL (SYRINGE) ×8 IMPLANT
SYR 3ML 18GX1 1/2 (SYRINGE) IMPLANT
SYR 50ML LL SCALE MARK (SYRINGE) ×4 IMPLANT
SYR CONTROL 10ML LL (SYRINGE) ×2 IMPLANT
SYR TOOMEY 50ML (SYRINGE) ×4 IMPLANT
TOWEL GREEN STERILE FF (TOWEL DISPOSABLE) ×4 IMPLANT
TRAY DSU PREP LF (CUSTOM PROCEDURE TRAY) ×2 IMPLANT
TUBING INFILTRATION IT-10001 (TUBING) IMPLANT
TUBING SET GRADUATE ASPIR 12FT (MISCELLANEOUS) ×2 IMPLANT
UNDERPAD 30X36 HEAVY ABSORB (UNDERPADS AND DIAPERS) ×4 IMPLANT

## 2021-02-17 NOTE — Anesthesia Postprocedure Evaluation (Signed)
Anesthesia Post Note  Patient: SETAREH ROM  Procedure(s) Performed: Lipofilling of right breast for symmetry (Right: Breast)     Patient location during evaluation: PACU Anesthesia Type: General Level of consciousness: awake and alert and oriented Pain management: pain level controlled Vital Signs Assessment: post-procedure vital signs reviewed and stable Respiratory status: spontaneous breathing, nonlabored ventilation and respiratory function stable Cardiovascular status: blood pressure returned to baseline and stable Postop Assessment: no apparent nausea or vomiting Anesthetic complications: no   No notable events documented.  Last Vitals:  Vitals:   02/17/21 1300 02/17/21 1315  BP: 114/70 105/77  Pulse: (!) 51 (!) 53  Resp: 19 18  Temp:    SpO2: 99% 96%    Last Pain:  Vitals:   02/17/21 1315  TempSrc:   PainSc: 5                  Opal Dinning A.

## 2021-02-17 NOTE — Discharge Instructions (Addendum)
INSTRUCTIONS FOR AFTER SURGERY   You will likely have some questions about what to expect following your operation.  The following information will help you and your family understand what to expect when you are discharged from the hospital.  Following these guidelines will help ensure a smooth recovery and reduce risks of complications.  Postoperative instructions include information on: diet, wound care, medications and physical activity.  AFTER SURGERY Expect to go home after the procedure.  In some cases, you may need to spend one night in the hospital for observation.  DIET This surgery does not require a specific diet.  However, I have to mention that the healthier you eat the better your body can start healing. It is important to increasing your protein intake.  This means limiting the foods with added sugar.  Focus on fruits and vegetables and some meat. It is very important to drink water after your surgery.  If your urine is bright yellow, then it is concentrated, and you need to drink more water.  As a general rule after surgery, you should have 8 ounces of water every hour while awake.  If you find you are persistently nauseated or unable to take in liquids let us know.  NO TOBACCO USE or EXPOSURE.  This will slow your healing process and increase the risk of a wound.  WOUND CARE If you don't have a drain: You can shower the day after surgery.  Use fragrance free soap.  Dial, [place], [place] and Cetaphil are usually mild on the skin.  If you have a drain: Clean with baby wipes for 3-5 days and then you can shower.  If you have a binder you may remove it to shower and then put it back on.  You may have Topifoam or Lipofoam on.  It is soft and spongy and helps keep you from getting creases if you have liposuction.  This can be removed before the shower and then replaced.  If you need more it is available on [place] as lipofoam.  If you have steri-strips / tape directly attached to your skin  leave them in place. It is OK to get these wet.  No baths, pools or hot tubs for two weeks. We close your incision to leave the smallest and best-looking scar. No ointment or creams on your incisions until given the go ahead.  Especially not Neosporin (Too many skin reactions with this one).  A few weeks after surgery you can use Mederma and start massaging the scar. We ask you to wear your binder or sports bra for the first 6 weeks around the clock, including while sleeping. This provides added comfort and helps reduce the fluid accumulation at the surgery site.  ACTIVITY No heavy lifting until cleared by the doctor.  It is OK to walk and climb stairs. In fact, moving your legs is very important to decrease your risk of a blood clot.  It will also help keep you from getting deconditioned.  Every 1 to 2 hours get up and walk for 5 minutes. This will help with a quicker recovery back to normal.  Let pain be your guide so you don't do too much.  NO, you cannot do the spring cleaning and don't plan on taking care of anyone else.  This is your time for TLC.   WORK Everyone returns to work at different times. As a rough guide, most people take at least 1 - 2 weeks off prior to returning to work. If  you need documentation for your job, bring the forms to your postoperative follow up visit.  DRIVING Arrange for someone to bring you home from the hospital.  You may be able to drive a few days after surgery but not while taking any narcotics or valium.  BOWEL MOVEMENTS Constipation can occur after anesthesia and while taking pain medication.  It is important to stay ahead for your comfort.  We recommend taking Milk of Magnesia (2 tablespoons; twice a day) while taking the pain pills.  SEROMA This is fluid your body tried to put in the surgical site.  This is normal but if it creates excessive pain and swelling let us know.  It usually decreases in a few weeks.  MEDICATIONS and PAIN CONTROL At your  preoperative visit for you history and physical you were given the following medications: An antibiotic: Start this medication when you get home and take according to the instructions on the bottle. Zofran 4 mg:  This is to treat nausea and vomiting.  You can take this every 6 hours as needed and only if needed. Norco (hydrocodone/acetaminophen) 5/325 mg:  This is only to be used after you have taken the motrin or the tylenol. Every 8 hours as needed. Over the counter Medication to take: Ibuprofen (Motrin) 600 mg:  Take this every 6 hours.  If you have additional pain then take 500 mg of the tylenol.  Only take the Norco after you have tried these two. Miralax or stool softener of choice: Take this according to the bottle if you take the [place] Call your surgeon's office if any of the following occur:  Fever 101 degrees F or greater  Excessive bleeding or fluid from the incision site.  Pain that increases over time without aid from the medications  Redness, warmth, or pus draining from incision sites  Persistent nausea or inability to take in liquids  Severe misshapen area that underwent the operation.    Oxycodone 5mg  given at 2:35pm.   Post Anesthesia Home Care Instructions  Activity: Get plenty of rest for the remainder of the day. A responsible individual must stay with you for 24 hours following the procedure.  For the next 24 hours, DO NOT: -Drive a car -Paediatric nurse -Drink alcoholic beverages -Take any medication unless instructed by your physician -Make any legal decisions or sign important papers.  Meals: Start with liquid foods such as gelatin or soup. Progress to regular foods as tolerated. Avoid greasy, spicy, heavy foods. If nausea and/or vomiting occur, drink only clear liquids until the nausea and/or vomiting subsides. Call your physician if vomiting continues.  Special Instructions/Symptoms: Your throat may feel dry or sore from the anesthesia  or the breathing tube placed in your throat during surgery. If this causes discomfort, gargle with warm salt water. The discomfort should disappear within 24 hours.  If you had a scopolamine patch placed behind your ear for the management of post- operative nausea and/or vomiting:  1. The medication in the patch is effective for 72 hours, after which it should be removed.  Wrap patch in a tissue and discard in the trash. Wash hands thoroughly with soap and water. 2. You may remove the patch earlier than 72 hours if you experience unpleasant side effects which may include dry mouth, dizziness or visual disturbances. 3. Avoid touching the patch. Wash your hands with soap and water after contact with the patch.

## 2021-02-17 NOTE — Anesthesia Procedure Notes (Signed)
Procedure Name: LMA Insertion Date/Time: 02/17/2021 10:51 AM Performed by: Garrel Ridgel, CRNA Pre-anesthesia Checklist: Patient identified, Emergency Drugs available, Suction available and Patient being monitored Patient Re-evaluated:Patient Re-evaluated prior to induction Oxygen Delivery Method: Circle system utilized Preoxygenation: Pre-oxygenation with 100% oxygen Induction Type: IV induction Ventilation: Mask ventilation without difficulty LMA: LMA inserted LMA Size: 4.0 Number of attempts: 1 Placement Confirmation: positive ETCO2 Tube secured with: Tape Dental Injury: Teeth and Oropharynx as per pre-operative assessment

## 2021-02-17 NOTE — Op Note (Signed)
Breast Reduction Op note:    DATE OF PROCEDURE: 02/17/2021  LOCATION: [place]  SURGEON: Lyndee Leo Sanger Breauna Mazzeo, DO  ASSISTANT: Krista Blue, PA  PREOPERATIVE DIAGNOSIS Breast asymmetry after breast cancer and mastectomy  POSTOPERATIVE DIAGNOSIS Same as preoperative diagnosis  PROCEDURES 1. Left breast asymmetry correction with lipofilling 681 cc  COMPLICATIONS: None.  DRAINS: none  INDICATIONS FOR PROCEDURE Gail Erickson is a 45 y.o. year-old female born on [birth year],with a history of right breast cancer who underwent a mastectomy.  She now has breast asymmetry.   MRN: [id number]  CONSENT Informed consent was obtained directly from the patient. The risks, benefits and alternatives were fully discussed. Specific risks including but not limited to bleeding, infection, hematoma, seroma, scarring, pain, nipple necrosis, asymmetry, poor cosmetic results, and need for further surgery were discussed. The patient had ample opportunity to have her questions answered to her satisfaction.  DESCRIPTION OF PROCEDURE  Patient was brought into the operating room and placed in a supine position.  SCDs were placed and appropriate padding was performed.  Antibiotics were given. The patient underwent general anesthesia and the chest was prepped and draped in a sterile fashion.  A timeout was performed and all information was confirmed to be correct.  The patient was prepped and draped.  Local was injected at the lower lateral portion of the abdomen and medical aspect of the right breast.  Tumescent was placed in the abdomen.  After waiting for the epinephrine liposuction was done.  The specimen was collected in the revolve and prepared according to the manufacture guidelines.  The fat was placed in 10 cc syringes and injected in the right breast with the blunt cannula.  A total of 300 cc was injected.  The skin was closed with 5-0 Monocryl sutures.  Dermabond was applied.   A breast binder, lipofoam, abdominal binder and ABDs were placed.  The nipple and skin flaps had good capillary refill at the end of the procedure.  The patient tolerated the procedure well. The patient was allowed to wake from anesthesia and taken to the recovery room in satisfactory condition.  The advanced practice practitioner (APP) assisted throughout the case.  The APP was essential in retraction and counter traction when needed to make the case progress smoothly.  This retraction and assistance made it possible to see the tissue plans for the procedure.  The assistance was needed for blood control, tissue re-approximation and assisted with closure of the incision site.

## 2021-02-17 NOTE — Transfer of Care (Signed)
Immediate Anesthesia Transfer of Care Note  Patient: Gail Erickson  Procedure(s) Performed: Lipofilling of right breast for symmetry (Right: Breast)  Patient Location: PACU  Anesthesia Type:General  Level of Consciousness: awake, alert , oriented and patient cooperative  Airway & Oxygen Therapy: Patient Spontanous Breathing and Patient connected to face mask oxygen  Post-op Assessment: Report given to RN and Post -op Vital signs reviewed and stable  Post vital signs: Reviewed and stable  Last Vitals:  Vitals Value Taken Time  BP 127/79 02/17/21 1215  Temp    Pulse 62 02/17/21 1220  Resp 21 02/17/21 1220  SpO2 100 % 02/17/21 1220  Vitals shown include unvalidated device data.  Last Pain:  Vitals:   02/17/21 0813  TempSrc: Oral  PainSc: 0-No pain      Patients Stated Pain Goal: 3 (17/47/15 9539)  Complications: No notable events documented.

## 2021-02-17 NOTE — Interval H&P Note (Signed)
History and Physical Interval Note:  02/17/2021 9:59 AM  Gail Erickson  has presented today for surgery, with the diagnosis of Malignant neoplasm of overlapping sites of right breast.  The various methods of treatment have been discussed with the patient and family. After consideration of risks, benefits and other options for treatment, the patient has consented to  Procedure(s) with comments: Lipofilling of right breast for symmetry (Right) - 1.5 hour as a surgical intervention.  The patient's history has been reviewed, patient examined, no change in status, stable for surgery.  I have reviewed the patient's chart and labs.  Questions were answered to the patient's satisfaction.     Loel Lofty Ana Liaw

## 2021-02-17 NOTE — Anesthesia Preprocedure Evaluation (Signed)
Anesthesia Evaluation  Patient identified by MRN, date of birth, ID band Patient awake    Reviewed: Allergy & Precautions, NPO status , Patient's Chart, lab work & pertinent test results, reviewed documented beta blocker date and time   Airway Mallampati: II  TM Distance: >3 FB Neck ROM: Full    Dental no notable dental hx. (+) Caps, Dental Advisory Given, Teeth Intact,    Pulmonary neg pulmonary ROS,    Pulmonary exam normal breath sounds clear to auscultation       Cardiovascular negative cardio ROS   Rhythm:Regular Rate:Normal     Neuro/Psych  Headaches, negative psych ROS   GI/Hepatic negative GI ROS, Neg liver ROS,   Endo/Other  Right Breast Ca S/P mastectomy on Tamoxifen Hx/o Hyperlipidemia- on no Rx Hx/o Pre Diabetes  Renal/GU negative Renal ROS  negative genitourinary   Musculoskeletal negative musculoskeletal ROS (+)   Abdominal (+) + obese,   Peds  Hematology  (+) anemia ,   Anesthesia Other Findings   Reproductive/Obstetrics negative OB ROS                             Anesthesia Physical Anesthesia Plan  ASA: 2  Anesthesia Plan: General   Post-op Pain Management:    Induction: Intravenous  PONV Risk Score and Plan: 4 or greater and Treatment may vary due to age or medical condition, Midazolam, Ondansetron and Dexamethasone  Airway Management Planned: LMA  Additional Equipment:   Intra-op Plan:   Post-operative Plan: Extubation in OR  Informed Consent: I have reviewed the patients History and Physical, chart, labs and discussed the procedure including the risks, benefits and alternatives for the proposed anesthesia with the patient or authorized representative who has indicated his/her understanding and acceptance.     Dental advisory given  Plan Discussed with: CRNA and Anesthesiologist  Anesthesia Plan Comments:         Anesthesia Quick  Evaluation

## 2021-02-18 ENCOUNTER — Telehealth: Payer: Self-pay | Admitting: General Practice

## 2021-02-18 ENCOUNTER — Encounter (HOSPITAL_BASED_OUTPATIENT_CLINIC_OR_DEPARTMENT_OTHER): Payer: Self-pay | Admitting: Plastic Surgery

## 2021-02-18 NOTE — Telephone Encounter (Signed)
[facility] CSW Progress Notes  Call to patient per referral for help w counseling/support resources.  She requests a call on Monday as she had surgery yesterday and is not feeling up to talking.  Will ask CSW [place] to follow up on Monday.  Edwyna Shell, LCSW Clinical Social Worker Phone:  [phone]

## 2021-02-21 ENCOUNTER — Encounter: Payer: Self-pay | Admitting: Licensed Clinical Social Worker

## 2021-02-21 NOTE — Progress Notes (Signed)
Clinical Social Work Progress Note  CSW spoke with patient, Ms. Child psychotherapist, who state she spoke with Webb Silversmith briefly on Friday and wanted to speak with someone more in depth.  Ms. Christo stated she has experienced an increase in anxiety due to diagnosis anniversary, third surgery and recent changes at work.  Ms. Charlot stated she was meeting with a therapist but the therapist is not practicing any longer. CSW explained we would be able to meet next time Ms. Yusuf came to the [facility] and we could talk about her concerns and look for different resources to assist her.  Ms. Bojarski agreed to meet with CSW on Friday 02/25/2021, at 10:00AM before her appointment.

## 2021-02-24 ENCOUNTER — Encounter: Payer: Self-pay | Admitting: Licensed Clinical Social Worker

## 2021-02-24 NOTE — Progress Notes (Signed)
[facility] CSW Progress Note  Clinical Social Worker contacted patient by phone, patient requested to speak at 9:00AM on 02/25/2021.    Adelene Amas , LCSW

## 2021-02-25 ENCOUNTER — Encounter: Payer: Self-pay | Admitting: Licensed Clinical Social Worker

## 2021-02-25 ENCOUNTER — Ambulatory Visit (INDEPENDENT_AMBULATORY_CARE_PROVIDER_SITE_OTHER): Payer: BC Managed Care – PPO | Admitting: Plastic Surgery

## 2021-02-25 ENCOUNTER — Other Ambulatory Visit: Payer: Self-pay

## 2021-02-25 ENCOUNTER — Encounter: Payer: Self-pay | Admitting: Plastic Surgery

## 2021-02-25 DIAGNOSIS — N6489 Other specified disorders of breast: Secondary | ICD-10-CM

## 2021-02-25 DIAGNOSIS — Z9011 Acquired absence of right breast and nipple: Secondary | ICD-10-CM

## 2021-02-25 NOTE — Progress Notes (Signed)
The patient is a 45 year old female here for follow-up after Lipo filling of the right breast.  She admits to being sore on her abdomen.  She is pleased with the fill and knows that it is going to take few more weeks for it to settle down.  Continue with the sports bra.  She can go into a spanks for her abdomen and use the Lipo foam.  More is available on [employer].  We will see her back in the next week or so.  Call with any questions or concerns.  Pictures were obtained of the patient and placed in the chart with the patient's or guardian's permission.

## 2021-02-25 NOTE — Progress Notes (Addendum)
°  [facility] CLINICAL SOCIAL WORK PROGRESS NOTE  Name: Gail Erickson Date: 02/25/2021 MRN: [id number]  DOB: 1976/01/20   Reason for visit:  Follow-up  Comments:  Patient stated she was able to find a new therapist in her area, who she is meeting with next week.  Patient stated she is feeling better since her surgery.  Patient stated she has a follow-up appointment next week with her doctor. Patient and CSW discussed work related concerns and issues which have come up since her diagnosis.  CSW encouraged patient to keep a journal and to document things which occur during her treatment plan, so she can keep track of different situations, whether at work or home.  Patient spoke about different stressors at work and how she has managed them, and that has affected her.  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  Patient stated she would think about different things she could do to delegate work responsibilities Patient stated she would contact this CSW if she has any concerns, or questions and if she was need of resources in the future.      Time spent counseling/coordinating care: 15-30 minutes

## 2021-03-01 DIAGNOSIS — F4323 Adjustment disorder with mixed anxiety and depressed mood: Secondary | ICD-10-CM | POA: Diagnosis not present

## 2021-03-08 DIAGNOSIS — F4323 Adjustment disorder with mixed anxiety and depressed mood: Secondary | ICD-10-CM | POA: Diagnosis not present

## 2021-03-11 ENCOUNTER — Other Ambulatory Visit: Payer: Self-pay

## 2021-03-11 ENCOUNTER — Ambulatory Visit (INDEPENDENT_AMBULATORY_CARE_PROVIDER_SITE_OTHER): Payer: BC Managed Care – PPO | Admitting: Surgical

## 2021-03-11 ENCOUNTER — Encounter: Payer: BC Managed Care – PPO | Admitting: Physician Assistant

## 2021-03-11 DIAGNOSIS — Z9011 Acquired absence of right breast and nipple: Secondary | ICD-10-CM

## 2021-03-11 DIAGNOSIS — C50811 Malignant neoplasm of overlapping sites of right female breast: Secondary | ICD-10-CM

## 2021-03-11 DIAGNOSIS — C50411 Malignant neoplasm of upper-outer quadrant of right female breast: Secondary | ICD-10-CM | POA: Diagnosis not present

## 2021-03-11 DIAGNOSIS — N6489 Other specified disorders of breast: Secondary | ICD-10-CM

## 2021-03-11 DIAGNOSIS — Z9012 Acquired absence of left breast and nipple: Secondary | ICD-10-CM | POA: Diagnosis not present

## 2021-03-11 NOTE — Progress Notes (Signed)
45 year old female here for follow-up after Lipo filling of right breast for symmetry on 02/17/2021.  Prior to this she underwent right breast reconstruction and placement of tissue expander and subsequent implant.  She also underwent left breast mastopexy/reduction for symmetry.  Gail Erickson reports she is overall doing well, she has some tenderness after surgery and noticed some firmness on her abdomen but feels that this is improving.  She is interested in nipple areolar tattooing.  Chaperone present on exam On exam left and right breast incisions are intact and healing well.  There is no erythema or cellulitic changes noted.  Improvement in the contour abnormalities of the right breast is noted.  There is no residual bruising noted.  She has good symmetry.  Plan:  She is healing well after right breast Lipo filling for improved symmetry and contour abnormalities.  We discussed that some changes in the volume may occur over the next few months depending on how much of the fat grafted is able to obtain an adequate blood supply.  Patient was understanding of this.  We discussed that sometimes additional fat grafting is necessary depending on the amount of fat that remains.  We discussed scheduling nipple areolar tattoo in approximately 6 weeks or so pending the patient's schedule and clinic schedule.  Pictures were obtained of the patient and placed in the chart with the patient's or guardian's permission.

## 2021-03-15 DIAGNOSIS — F4323 Adjustment disorder with mixed anxiety and depressed mood: Secondary | ICD-10-CM | POA: Diagnosis not present

## 2021-03-18 ENCOUNTER — Encounter: Payer: BC Managed Care – PPO | Admitting: Nurse Practitioner

## 2021-03-29 ENCOUNTER — Ambulatory Visit: Payer: BC Managed Care – PPO | Admitting: Nurse Practitioner

## 2021-03-29 ENCOUNTER — Encounter: Payer: Self-pay | Admitting: Nurse Practitioner

## 2021-03-29 ENCOUNTER — Other Ambulatory Visit: Payer: Self-pay

## 2021-03-29 VITALS — BP 109/71 | HR 86 | Ht 69.0 in | Wt 227.0 lb

## 2021-03-29 DIAGNOSIS — E785 Hyperlipidemia, unspecified: Secondary | ICD-10-CM

## 2021-03-29 DIAGNOSIS — Z1211 Encounter for screening for malignant neoplasm of colon: Secondary | ICD-10-CM

## 2021-03-29 DIAGNOSIS — R3989 Other symptoms and signs involving the genitourinary system: Secondary | ICD-10-CM | POA: Diagnosis not present

## 2021-03-29 DIAGNOSIS — Z6833 Body mass index (BMI) 33.0-33.9, adult: Secondary | ICD-10-CM

## 2021-03-29 DIAGNOSIS — E559 Vitamin D deficiency, unspecified: Secondary | ICD-10-CM

## 2021-03-29 DIAGNOSIS — E669 Obesity, unspecified: Secondary | ICD-10-CM | POA: Insufficient documentation

## 2021-03-29 DIAGNOSIS — E66811 Obesity, class 1: Secondary | ICD-10-CM

## 2021-03-29 DIAGNOSIS — F4323 Adjustment disorder with mixed anxiety and depressed mood: Secondary | ICD-10-CM | POA: Diagnosis not present

## 2021-03-29 LAB — POCT URINALYSIS DIP (CLINITEK)
Bilirubin, UA: NEGATIVE
Blood, UA: NEGATIVE
Glucose, UA: NEGATIVE mg/dL
Ketones, POC UA: NEGATIVE mg/dL
Nitrite, UA: NEGATIVE
POC PROTEIN,UA: NEGATIVE
Spec Grav, UA: 1.01 (ref 1.010–1.025)
Urobilinogen, UA: 0.2 E.U./dL
pH, UA: 7 (ref 5.0–8.0)

## 2021-03-29 NOTE — Patient Instructions (Signed)
Please get your covid booster, check with oncology to see if you can get your pneumonia vaccine.    It is important that you exercise regularly at least 30 minutes 5 times a week.  Think about what you will eat, plan ahead. Choose " clean, green, fresh or frozen" over canned, processed or packaged foods which are more sugary, salty and fatty. 70 to 75% of food eaten should be vegetables and fruit. Three meals at set times with snacks allowed between meals, but they must be fruit or vegetables. Aim to eat over a 12 hour period , example 7 am to 7 pm, and STOP after  your last meal of the day. Drink water,generally about 64 ounces per day, no other drink is as healthy. Fruit juice is best enjoyed in a healthy way, by EATING the fruit.  Thanks for choosing [facility], we consider it a privelige to serve you.

## 2021-03-29 NOTE — Assessment & Plan Note (Signed)
Urinalysis done today, showed trace leucocytes , will obtain  urine culture. Patient told to drink plenty of water to stay hydrated.

## 2021-03-29 NOTE — Assessment & Plan Note (Signed)
Managed by OBGYN, PT will getl abs during her next annual physical at [facility]

## 2021-03-29 NOTE — Progress Notes (Signed)
° °  [name]     MRN: [id number]      DOB: December 26, 1975   HPI [name] is here for follow up and re-evaluation of chronic medical conditions, medication management and review of any available recent lab and radiology data.  Preventive health is updated, specifically  Cancer screening and Immunization.   Questions or concerns regarding consultations or procedures which the PT has had in the interim are  addressed. The PT denies any adverse reactions to current medications since the last visit.   Pt c/o urine discoloration that started this week, she just finished her cycle. Denies burring urination, pelvic pain, increased frequency , fever , chills , bloody urine. She had symptoms around Nov 2022 she was on antibiotics for three days, she ended up testing negative for UTI.   Marland KitchenShe has started taking weight loss medication mounjaro, med was prescribed by endocrinology. Gets either MRI, or mammogram every 6 months sees oncology for breast cancer  ROS Denies recent fever or chills. Denies chest congestion, productive cough or wheezing. Denies chest pains, palpitations and leg swelling Denies abdominal pain, nausea, vomiting,diarrhea or constipation.   Denies dysuria, frequency, hesitancy or incontinence., has discolored urine  Denies depression, anxiety or insomnia.    PE  BP 109/71 (BP Location: Left Arm, Patient Position: Sitting, Cuff Size: Large)    Pulse 86    Ht 5\' 9"  (1.753 m)    Wt 227 lb (103 kg)    LMP 03/19/2021 (Exact Date)    SpO2 93%    BMI 33.52 kg/m   Patient alert and oriented and in no cardiopulmonary distress.  Chest: Clear to auscultation bilaterally.  CVS: S1, S2 no murmurs, no S3.Regular rate.  ABD: Soft non tender.   Psych: Good eye contact, normal affect. Memory intact not anxious or depressed appearing.  CNS: CN 2-12 intact, power,  normal throughout.no focal deficits noted.   Assessment & Plan

## 2021-03-29 NOTE — Assessment & Plan Note (Signed)
Last lipids Lab Results  Component Value Date   CHOL 193 07/26/2020   HDL 50 07/26/2020   LDLCALC 125 (H) 07/26/2020   TRIG 82 07/26/2020   CHOLHDL 3.9 07/26/2020  will get labs checked at OBGYN during her next annual physical.

## 2021-03-29 NOTE — Assessment & Plan Note (Signed)
PT referred to GI for colonscopy

## 2021-03-29 NOTE — Assessment & Plan Note (Signed)
Wt Readings from Last 3 Encounters:  03/29/21 227 lb (103 kg)  02/17/21 223 lb 12.3 oz (101.5 kg)  02/11/21 230 lb 12.8 oz (104.7 kg)  on treatment for breast cancer. Takes monjaro. Managed by her endocrinologist.  Importance of healthy food choices with portion control discussed as well as eating regularly within 12  hour window.   The need to choose clean green food 50%-75% of time is discussed as well as make water the primary drink and set a goal for 64 ounces daily.  Patient reeducated about the importance of committment to minimum of 150 minutes of exercise per week.  Three meals at set times with snacks allowed between meals but they must be fruit or vegetable.   Aim to eat  over 12 hour period  for example 7 am to 7 pm. Stop after your last meal of the day.

## 2021-03-29 NOTE — Progress Notes (Signed)
Please review labs with pt, has trace leucocytes, most likely does not have uti, will do a urine culture please thanks

## 2021-03-30 ENCOUNTER — Encounter: Payer: Self-pay | Admitting: Licensed Clinical Social Worker

## 2021-03-30 NOTE — Progress Notes (Signed)
[facility] CSW Progress Note  Clinical Education officer, museum contacted patient by phone to follow-up from previous encounter.  CSW left voicemail with contact information and request for a return call.Adelene Amas , LCSW

## 2021-04-04 ENCOUNTER — Encounter: Payer: Self-pay | Admitting: Internal Medicine

## 2021-04-12 DIAGNOSIS — F4323 Adjustment disorder with mixed anxiety and depressed mood: Secondary | ICD-10-CM | POA: Diagnosis not present

## 2021-04-22 ENCOUNTER — Ambulatory Visit (INDEPENDENT_AMBULATORY_CARE_PROVIDER_SITE_OTHER): Payer: BC Managed Care – PPO | Admitting: Surgical

## 2021-04-22 DIAGNOSIS — Z9011 Acquired absence of right breast and nipple: Secondary | ICD-10-CM

## 2021-04-22 DIAGNOSIS — N6489 Other specified disorders of breast: Secondary | ICD-10-CM

## 2021-04-22 NOTE — Progress Notes (Signed)
46 year old female presents today via virtual visit to discuss nipple areolar tattooing and postop recovery from lipo filling of right breast for symmetry.  She reports that she is doing well, feels as if she has noticed some slight changes since her last visit with some volume changes in the right breast.  She reports that otherwise she is doing really well, she is very pleased so far.  She is interested in moving forward with nipple areolar tattooing, she is currently scheduled for May 13, 2021, however would like to do this sooner if possible.  The patient gave consent to have this visit done by telemedicine / virtual visit, two identifiers were used to identify patient. This is also consent for access the chart and treat the patient via this visit. The patient is located at home.  I, the provider, am at the office.  We spent 5 minutes together for the visit.  Joined by telephone.   Discussed with patient we may have some availability to move nipple areolar tattooing appointment up sooner.  We will review my schedule.  She is doing well after recovery from her lipo filling of right breast.  Recommend calling with questions or concerns.

## 2021-04-26 DIAGNOSIS — F4323 Adjustment disorder with mixed anxiety and depressed mood: Secondary | ICD-10-CM | POA: Diagnosis not present

## 2021-05-02 ENCOUNTER — Ambulatory Visit: Payer: BC Managed Care – PPO | Admitting: Surgical

## 2021-05-02 ENCOUNTER — Other Ambulatory Visit: Payer: Self-pay

## 2021-05-02 DIAGNOSIS — Z9011 Acquired absence of right breast and nipple: Secondary | ICD-10-CM

## 2021-05-02 DIAGNOSIS — N6489 Other specified disorders of breast: Secondary | ICD-10-CM | POA: Diagnosis not present

## 2021-05-02 DIAGNOSIS — C50811 Malignant neoplasm of overlapping sites of right female breast: Secondary | ICD-10-CM | POA: Diagnosis not present

## 2021-05-02 NOTE — Progress Notes (Signed)
NIPPLE AREOLAR TATTOO PROCEDURE  PREOPERATIVE DIAGNOSIS:  Acquired absence of right nipple areola  POSTOPERATIVE DIAGNOSIS: Acquired absence of right nipple areolar    PROCEDURES: right nipple areola tattoo   ANESTHESIA:  Lidocaine 23%, Tetracaine 7%  COMPLICATIONS: None.  JUSTIFICATION FOR PROCEDURE:  Patient is a 46 y.o. female with a history of breast cancer status post breast reconstruction. The patient presents for nipple areolar complex tattoo. Risks, benefits, indications, and alternatives of the above described procedures were discussed with the patient and all the patient's questions were answered.   DESCRIPTION OF PROCEDURE: The patient was in the procedure room and resting in procedure chair. The patient was prepped and draped in the usual sterile fashion. Attention was turned to the selection of flesh colored permanent tattoo ink to produce the appropriate color for nipple areolar complex tattooing. Color, size and location was confirmed with the patient. NAC on the left was measured at 20cm STN. Pre-op photos were obtained and placed in patient chart with patient consent at her previous visit. Using a Digital Revo 7R pronged tattoo head, a Digital Revo 66F pronged tattoo head, pigment was instilled to the designed nipple areolar complex, which was confirmed preoperatively with the patient. Once adequate pigment had been applied to the nipple areolar complex, vaseline followed by an occlusive dressing was applied. The patient tolerated the procedure well. There were no complications  A 1:1 ratio of Warm Mink and Dark Mink was used to create the desired color. 1 part Warm Mink -  Exp 03/2024  1 part Dark Mink -  Exp 03/2024   Pictures were obtained of the patient and placed in the chart with the patient's or guardian's permission.

## 2021-05-03 DIAGNOSIS — F4323 Adjustment disorder with mixed anxiety and depressed mood: Secondary | ICD-10-CM | POA: Diagnosis not present

## 2021-05-10 DIAGNOSIS — F4323 Adjustment disorder with mixed anxiety and depressed mood: Secondary | ICD-10-CM | POA: Diagnosis not present

## 2021-05-12 ENCOUNTER — Ambulatory Visit (INDEPENDENT_AMBULATORY_CARE_PROVIDER_SITE_OTHER): Payer: Self-pay | Admitting: *Deleted

## 2021-05-12 ENCOUNTER — Other Ambulatory Visit: Payer: Self-pay

## 2021-05-12 VITALS — Ht 69.0 in | Wt 220.0 lb

## 2021-05-12 DIAGNOSIS — Z1211 Encounter for screening for malignant neoplasm of colon: Secondary | ICD-10-CM

## 2021-05-12 NOTE — Progress Notes (Signed)
Last dose of Mounjaro was taken 04/02/2021.  Insurance not covering anymore so no longer takes it.  She was taking it for pre-diabetes and weight loss. ?

## 2021-05-12 NOTE — Progress Notes (Addendum)
Gastroenterology Pre-Procedure Review ? ?Request Date: 05/12/2021 ?Requesting Physician: Vena Rua, FNP @ [facility], no previous TCS, family hx of colon cancer (second cousin) ? ?PATIENT REVIEW QUESTIONS: The patient responded to the following health history questions as indicated:   ? ?1. Diabetes Melitis: yes, pre-diabetic ?2. Joint replacements in the past 12 months: no ?3. Major health problems in the past 3 months: yes, lipofilling breast 02/17/2021 ?4. Has an artificial valve or MVP: no ?5. Has a defibrillator: no ?6. Has been advised in past to take antibiotics in advance of a procedure like teeth cleaning: no ?7. Family history of colon cancer: yes, second cousin: early 45's  ?8. Alcohol Use: yes, 1-2 drinks every month ?9. Illicit drug Use: no ?10. History of sleep apnea: no  ?11. History of coronary artery or other vascular stents placed within the last 12 months: no ?12. History of any prior anesthesia complications: no ?13. Body mass index is 32.49 kg/m?. ?   ?MEDICATIONS & ALLERGIES:    ?Patient reports the following regarding taking any blood thinners:   ?Plavix? no ?Aspirin? Yes, excedrin migraine as needed ?Coumadin? no ?Brilinta? no ?Xarelto? no ?Eliquis? no ?Pradaxa? no ?Savaysa? no ?Effient? no ? ?Patient confirms/reports the following medications:  ?Current Outpatient Medications  ?Medication Sig Dispense Refill  ? Ascorbic Acid (VITAMIN C) 1000 MG tablet Take 1,000 mg by mouth every 14 (fourteen) days.    ? Aspirin-Acetaminophen-Caffeine (EXCEDRIN MIGRAINE PO) Take 1 tablet by mouth as needed.    ? Multiple Vitamin (MULTIVITAMIN) tablet Take 1 tablet by mouth every 14 (fourteen) days.    ? Omega-3 Fatty Acids (FISH OIL) 1000 MG CAPS Take by mouth every 14 (fourteen) days.    ? Probiotic Product (PROBIOTIC-10 PO) Take 1 tablet by mouth every 14 (fourteen) days.    ? tamoxifen (NOLVADEX) 20 MG tablet Take 1 tablet (20 mg total) by mouth daily. 90 tablet 1  ? VITAMIN D PO Take  1,000 Int'l Units by mouth every 14 (fourteen) days.    ? ?No current facility-administered medications for this visit.  ? ? ?Patient confirms/reports the following allergies:  ?No Known Allergies ? ?No orders of the defined types were placed in this encounter. ? ? ?AUTHORIZATION INFORMATION ?Primary Insurance: [place],  [place] #: [id number],  Group #: [id number] ?Pre-Cert / Josem Kaufmann required: Yes, approved 05/19/2021-07/08/2021 per Marcello Moores ?Pre-Cert / Auth #: [id number] ? ?SCHEDULE INFORMATION: ?Procedure has been scheduled as follows:  ?Date: 06/07/2021, Time: 10:30 ?Location: [facility] with Dr. Abbey Chatters ? ?This Gastroenterology Pre-Precedure Review Form is being routed to the following provider(s): Roseanne Kaufman, NP ?  ?

## 2021-05-13 ENCOUNTER — Ambulatory Visit: Payer: BC Managed Care – PPO | Admitting: Surgical

## 2021-05-16 NOTE — Progress Notes (Signed)
ASA 2. Appropriate.  ?

## 2021-05-17 ENCOUNTER — Other Ambulatory Visit: Payer: Self-pay | Admitting: *Deleted

## 2021-05-17 ENCOUNTER — Encounter: Payer: Self-pay | Admitting: *Deleted

## 2021-05-17 DIAGNOSIS — Z1211 Encounter for screening for malignant neoplasm of colon: Secondary | ICD-10-CM

## 2021-05-17 DIAGNOSIS — L680 Hirsutism: Secondary | ICD-10-CM | POA: Diagnosis not present

## 2021-05-17 DIAGNOSIS — R7301 Impaired fasting glucose: Secondary | ICD-10-CM | POA: Diagnosis not present

## 2021-05-17 DIAGNOSIS — F4323 Adjustment disorder with mixed anxiety and depressed mood: Secondary | ICD-10-CM | POA: Diagnosis not present

## 2021-05-17 MED ORDER — CLENPIQ 10-3.5-12 MG-GM -GM/160ML PO SOLN
1.0000 | Freq: Once | ORAL | 0 refills | Status: AC
Start: 2021-05-17 — End: 2021-05-17

## 2021-05-17 NOTE — Progress Notes (Signed)
Lmom for pt to call me back. 

## 2021-05-17 NOTE — Addendum Note (Signed)
Addended by: Metro Kung on: 05/17/2021 09:33 AM ? ? Modules accepted: Orders ? ?

## 2021-05-17 NOTE — Progress Notes (Signed)
Spoke to pt.  Scheduled procedure for 06/07/2021 at 10:30, arrival at 9:00 at [facility].  Reviewed prep instructions with pt by phone.  Pt made aware that I am mailing out a coupon to help with Clenpiq along with her prep instructions.  She was advised to have labs drawn at [facility] on 06/03/2021.  Confirmed mailing address with pt. ?

## 2021-05-19 ENCOUNTER — Other Ambulatory Visit: Payer: Self-pay

## 2021-05-19 ENCOUNTER — Other Ambulatory Visit: Payer: Self-pay | Admitting: *Deleted

## 2021-05-19 ENCOUNTER — Telehealth: Payer: Self-pay | Admitting: *Deleted

## 2021-05-19 DIAGNOSIS — Z7689 Persons encountering health services in other specified circumstances: Secondary | ICD-10-CM

## 2021-05-19 NOTE — Progress Notes (Addendum)
Colonoscopies are covered starting at age 47 per Gail Erickson at [employer].  REF#: [id number] ?

## 2021-05-19 NOTE — Telephone Encounter (Signed)
Spoke to pt regarding the healthy weight and wellness program. Interested and would like a referral to the program. Dr. Burr Medico notified. ?

## 2021-05-20 ENCOUNTER — Other Ambulatory Visit: Payer: Self-pay | Admitting: Hematology

## 2021-05-20 DIAGNOSIS — C50811 Malignant neoplasm of overlapping sites of right female breast: Secondary | ICD-10-CM

## 2021-05-26 DIAGNOSIS — F4323 Adjustment disorder with mixed anxiety and depressed mood: Secondary | ICD-10-CM | POA: Diagnosis not present

## 2021-06-03 ENCOUNTER — Other Ambulatory Visit (HOSPITAL_COMMUNITY)
Admission: RE | Admit: 2021-06-03 | Discharge: 2021-06-03 | Disposition: A | Discharge status: Home / Self Care | Payer: BC Managed Care – PPO | Source: Ambulatory Visit | Attending: Internal Medicine | Admitting: Internal Medicine

## 2021-06-03 DIAGNOSIS — Z1211 Encounter for screening for malignant neoplasm of colon: Secondary | ICD-10-CM | POA: Diagnosis not present

## 2021-06-03 LAB — PREGNANCY, URINE: Preg Test, Ur: NEGATIVE

## 2021-06-06 DIAGNOSIS — F4323 Adjustment disorder with mixed anxiety and depressed mood: Secondary | ICD-10-CM | POA: Diagnosis not present

## 2021-06-07 ENCOUNTER — Encounter (HOSPITAL_COMMUNITY): Payer: Self-pay

## 2021-06-07 ENCOUNTER — Encounter (HOSPITAL_COMMUNITY)
Admission: RE | Disposition: A | Discharge status: Home / Self Care | Payer: Self-pay | Source: Home / Self Care | Attending: Internal Medicine

## 2021-06-07 ENCOUNTER — Other Ambulatory Visit: Payer: Self-pay

## 2021-06-07 ENCOUNTER — Ambulatory Visit (HOSPITAL_COMMUNITY): Payer: BC Managed Care – PPO | Admitting: Anesthesiology

## 2021-06-07 ENCOUNTER — Ambulatory Visit (HOSPITAL_COMMUNITY)
Admission: RE | Admit: 2021-06-07 | Discharge: 2021-06-07 | Disposition: A | Discharge status: Home / Self Care | Payer: BC Managed Care – PPO | Attending: Internal Medicine | Admitting: Internal Medicine

## 2021-06-07 DIAGNOSIS — E785 Hyperlipidemia, unspecified: Secondary | ICD-10-CM | POA: Diagnosis not present

## 2021-06-07 DIAGNOSIS — Z1211 Encounter for screening for malignant neoplasm of colon: Secondary | ICD-10-CM | POA: Insufficient documentation

## 2021-06-07 DIAGNOSIS — K648 Other hemorrhoids: Secondary | ICD-10-CM | POA: Insufficient documentation

## 2021-06-07 DIAGNOSIS — Z79899 Other long term (current) drug therapy: Secondary | ICD-10-CM | POA: Diagnosis not present

## 2021-06-07 DIAGNOSIS — K635 Polyp of colon: Secondary | ICD-10-CM | POA: Diagnosis not present

## 2021-06-07 DIAGNOSIS — Z139 Encounter for screening, unspecified: Secondary | ICD-10-CM | POA: Diagnosis not present

## 2021-06-07 HISTORY — PX: COLONOSCOPY WITH PROPOFOL: SHX5780

## 2021-06-07 HISTORY — PX: POLYPECTOMY: SHX5525

## 2021-06-07 SURGERY — COLONOSCOPY WITH PROPOFOL
Anesthesia: General

## 2021-06-07 MED ORDER — LIDOCAINE HCL (CARDIAC) PF 100 MG/5ML IV SOSY
PREFILLED_SYRINGE | INTRAVENOUS | Status: DC | PRN
  Administered 2021-06-07: 50 mg via INTRAVENOUS

## 2021-06-07 MED ORDER — PROPOFOL 500 MG/50ML IV EMUL
INTRAVENOUS | Status: DC | PRN
  Administered 2021-06-07: 150 ug/kg/min via INTRAVENOUS

## 2021-06-07 MED ORDER — PROPOFOL 10 MG/ML IV BOLUS
INTRAVENOUS | Status: DC | PRN
  Administered 2021-06-07: 100 mg via INTRAVENOUS

## 2021-06-07 MED ORDER — LACTATED RINGERS IV SOLN: INTRAVENOUS | Status: DC

## 2021-06-07 NOTE — Transfer of Care (Signed)
Immediate Anesthesia Transfer of Care Note ? ?Patient: Gail Erickson ? ?Procedure(s) Performed: COLONOSCOPY WITH PROPOFOL ?POLYPECTOMY ? ?Patient Location: Endoscopy Unit ? ?Anesthesia Type:General ? ?Level of Consciousness: drowsy ? ?Airway & Oxygen Therapy: Patient Spontanous Breathing ? ?Post-op Assessment: Report given to RN and Post -op Vital signs reviewed and stable ? ?Post vital signs: Reviewed and stable ? ?Last Vitals:  ?Vitals Value Taken Time  ?BP    ?Temp    ?Pulse 71   ?Resp    ?SpO2 98%   ? ? ?Last Pain:  ?Vitals:  ? 06/07/21 1012  ?TempSrc:   ?PainSc: 0-No pain  ?   ? ?  ? ?Complications: No notable events documented. ?

## 2021-06-07 NOTE — Op Note (Signed)
[facility] ?Patient Name: Gail Erickson ?Procedure Date: 06/07/2021 10:03 AM ?MRN: [id number] ?Date of Birth: 1976/02/16 ?Attending MD: Elon Alas. Abbey Chatters , DO ?CSN: [id number] ?Age: 46 ?Admit Type: Outpatient ?Procedure:                Colonoscopy ?Indications:              Screening for colorectal malignant neoplasm ?Providers:                Elon Alas. Abbey Chatters, DO, Caprice Kluver, Raphael Gibney,  ?                          Technician ?Referring MD:              ?Medicines:                See the Anesthesia note for documentation of the  ?                          administered medications ?Complications:            No immediate complications. ?Estimated Blood Loss:     Estimated blood loss was minimal. ?Procedure:                Pre-Anesthesia Assessment: ?                          - The anesthesia plan was to use monitored  ?                          anesthesia care (MAC). ?                          After obtaining informed consent, the colonoscope  ?                          was passed under direct vision. Throughout the  ?                          procedure, the patient's blood pressure, pulse, and  ?                          oxygen saturations were monitored continuously. The  ?                          PCF-HQ190L ([id number]) scope was introduced through  ?                          the anus and advanced to the the cecum, identified  ?                          by appendiceal orifice and ileocecal valve. The  ?                          colonoscopy was performed without difficulty. The  ?                          patient tolerated the procedure well. The quality  ?  of the bowel preparation was evaluated using the  ?                          BBPS [facility] Bowel Preparation Scale) with scores  ?                          of: Right Colon = 3, Transverse Colon = 3 and Left  ?                          Colon = 3 (entire mucosa seen well with no residual  ?                          staining, small  fragments of stool or opaque  ?                          liquid). The total BBPS score equals 9. ?Scope In: 10:15:44 AM ?Scope Out: 10:29:13 AM ?Scope Withdrawal Time: 0 hours 8 minutes 4 seconds  ?Total Procedure Duration: 0 hours 13 minutes 29 seconds  ?Findings: ?     The perianal and digital rectal examinations were normal. ?     Non-bleeding internal hemorrhoids were found during endoscopy. ?     A 4 mm polyp was found in the transverse colon. The polyp was sessile.  ?     The polyp was removed with a cold snare. Resection and retrieval were  ?     complete. ?     The exam was otherwise without abnormality. ?Impression:               - Non-bleeding internal hemorrhoids. ?                          - One 4 mm polyp in the transverse colon, removed  ?                          with a cold snare. Resected and retrieved. ?                          - The examination was otherwise normal. ?Moderate Sedation: ?     Per Anesthesia Care ?Recommendation:           - Patient has a contact number available for  ?                          emergencies. The signs and symptoms of potential  ?                          delayed complications were discussed with the  ?                          patient. Return to normal activities tomorrow.  ?                          Written discharge instructions were provided to the  ?  patient. ?                          - Resume previous diet. ?                          - Continue present medications. ?                          - Await pathology results. ?                          - Repeat colonoscopy in 5-10 years for surveillance. ?                          - Return to GI clinic PRN. ?Procedure Code(s):        --- Professional --- ?                          [phone], Colonoscopy, flexible; with removal of  ?                          tumor(s), polyp(s), or other lesion(s) by snare  ?                          technique ?Diagnosis Code(s):        --- Professional --- ?                           Z12.11, Encounter for screening for malignant  ?                          neoplasm of colon ?                          K63.5, Polyp of colon ?                          K64.8, Other hemorrhoids ?CPT copyright 2019 American Medical Association. All rights reserved. ?The codes documented in this report are preliminary and upon coder review may  ?be revised to meet current compliance requirements. ?Elon Alas. Abbey Chatters, DO ?Elon Alas. [place], DO ?06/07/2021 10:32:58 AM ?This report has been signed electronically. ?Number of Addenda: 0 ?

## 2021-06-07 NOTE — Anesthesia Preprocedure Evaluation (Signed)
Anesthesia Evaluation  ?Patient identified by MRN, date of birth, ID band ?Patient awake ? ? ? ?Reviewed: ?Allergy & Precautions, H&P , NPO status , Patient's Chart, lab work & pertinent test results, reviewed documented beta blocker date and time  ? ?Airway ?Mallampati: II ? ?TM Distance: >3 FB ?Neck ROM: full ? ? ? Dental ?no notable dental hx. ? ?  ?Pulmonary ?neg pulmonary ROS,  ?  ?Pulmonary exam normal ?breath sounds clear to auscultation ? ? ? ? ? ? Cardiovascular ?Exercise Tolerance: Good ?negative cardio ROS ? ? ?Rhythm:regular Rate:Normal ? ? ?  ?Neuro/Psych ? Headaches, negative psych ROS  ? GI/Hepatic ?negative GI ROS, Neg liver ROS,   ?Endo/Other  ?negative endocrine ROS ? Renal/GU ?negative Renal ROS  ?negative genitourinary ?  ?Musculoskeletal ? ? Abdominal ?  ?Peds ? Hematology ?negative hematology ROS ?(+)   ?Anesthesia Other Findings ? ? Reproductive/Obstetrics ?negative OB ROS ? ?  ? ? ? ? ? ? ? ? ? ? ? ? ? ?  ?  ? ? ? ? ? ? ? ? ?Anesthesia Physical ?Anesthesia Plan ? ?ASA: 2 ? ?Anesthesia Plan: General  ? ?Post-op Pain Management:   ? ?Induction:  ? ?PONV Risk Score and Plan: Propofol infusion ? ?Airway Management Planned:  ? ?Additional Equipment:  ? ?Intra-op Plan:  ? ?Post-operative Plan:  ? ?Informed Consent: I have reviewed the patients History and Physical, chart, labs and discussed the procedure including the risks, benefits and alternatives for the proposed anesthesia with the patient or authorized representative who has indicated his/her understanding and acceptance.  ? ? ? ?Dental Advisory Given ? ?Plan Discussed with: CRNA ? ?Anesthesia Plan Comments:   ? ? ? ? ? ? ?Anesthesia Quick Evaluation ? ?

## 2021-06-07 NOTE — H&P (Signed)
Primary Care Physician:  Renee Rival, FNP ?Primary Gastroenterologist:  Dr. Abbey Chatters ? ?Pre-Procedure History & Physical: ?HPI:  Gail Erickson is a 46 y.o. female is here for first ever colonoscopy for colon cancer screening purposes.  Patient denies any family history of colorectal cancer.  No melena or hematochezia.  No abdominal pain or unintentional weight loss.  No change in bowel habits.  Overall feels well from a GI standpoint. ? ?Past Medical History:  ?Diagnosis Date  ? Abnormal Pap smear of cervix 06/02/2014  ? LGSIL:neg HR HPV (1st abn. pap)  ? Cancer [facility])   ? Cyst of right breast   ? --stable in 2015 U/S done in [place], [place]  ? Elevated DHEA 05/21/2015  ? level = 343  ? Family history of breast cancer   ? HSV-1 (herpes simplex virus 1) infection   ? oral HSV  ? Migraine without aura   ? Pre-diabetes   ? Prediabetes 2021  ? sees Dr.Balan  ? Vitamin D deficiency 05/21/2015  ? level = 10  ? ? ?Past Surgical History:  ?Procedure Laterality Date  ? BREAST RECONSTRUCTION WITH PLACEMENT OF TISSUE EXPANDER AND FLEX HD (ACELLULAR HYDRATED DERMIS) Right 04/28/2020  ? Procedure: IMMEDIATE RIGHT BREAST RECONSTRUCTION WITH PLACEMENT OF TISSUE EXPANDER AND FLEX HD (ACELLULAR HYDRATED DERMIS);  Surgeon: Wallace Going, DO;  Location: [place];  Service: Plastics;  Laterality: Right;  ? COLPOSCOPY    ? LIPOSUCTION WITH LIPOFILLING Right 02/17/2021  ? Procedure: Lipofilling of right breast for symmetry;  Surgeon: Wallace Going, DO;  Location: [place];  Service: Plastics;  Laterality: Right;  1.5 hour  ? MASTECTOMY W/ SENTINEL NODE BIOPSY Right 04/28/2020  ? Procedure: RIGHT MASTECTOMY WITH SENTINEL LYMPH NODE BIOPSY;  Surgeon: [name], MD;  Location: [place];  Service: General;  Laterality: Right;  PECTORAL BLOCK  ? MASTOPEXY Left 09/23/2020  ? Procedure: LEFT BREAST MASTOPEXY;  Surgeon: Wallace Going, DO;  Location: [place];  Service: Plastics;  Laterality: Left;  ? REMOVAL OF TISSUE EXPANDER AND PLACEMENT OF IMPLANT Right 09/23/2020  ? Procedure: REMOVAL OF TISSUE EXPANDER AND PLACEMENT OF IMPLANT;  Surgeon: Wallace Going, DO;  Location: [place];  Service: Plastics;  Laterality: Right;  90 min  ? UMBILICAL HERNIA REPAIR    ? --as a child  ? ? ?Prior to Admission medications   ?Medication Sig Start Date End Date Taking? Authorizing Provider  ?Ascorbic Acid (VITAMIN C) 1000 MG tablet Take 1,000 mg by mouth every 14 (fourteen) days.   Yes [provider]  ?Aspirin-Acetaminophen-Caffeine (EXCEDRIN MIGRAINE PO) Take 1 tablet by mouth daily as needed (headache).   Yes [provider]  ?Multiple Vitamin (MULTIVITAMIN) tablet Take 1 tablet by mouth every 14 (fourteen) days.   Yes [provider]  ?Omega-3 Fatty Acids (FISH OIL) 1000 MG CAPS Take by mouth every 14 (fourteen) days.   Yes [provider]  ?Probiotic Product (PROBIOTIC-10 PO) Take 1 tablet by mouth every 14 (fourteen) days.   Yes [provider]  ?tamoxifen (NOLVADEX) 20 MG tablet TAKE 1 TABLET(20 MG) BY MOUTH DAILY 05/20/21  Yes [name], MD  ?VITAMIN D PO Take 1,000 Int'l Units by mouth every 14 (fourteen) days.   Yes [provider]  ? ? ?Allergies as of 05/17/2021  ? (No Known Allergies)  ? ? ?Family History  ?Problem Relation Age of Onset  ? Breast cancer  Mother 21  ? Diabetes Mother   ? Hypertension Mother   ? Hyperlipidemia Mother   ? Thyroid disease Mother   ? Transient ischemic attack Mother   ? Breast cancer Paternal Aunt 8  ?     bil. Breast ca--deceased CHF  ? Breast cancer Cousin 7  ?     maternal first cousin died Breast ca age 52  ? Hyperlipidemia Father   ? HIV/AIDS Maternal Uncle   ? Cancer Paternal Uncle   ?     NOS - needed BMT  ? Cancer Maternal Grandmother 50  ?     adrenal gland cancer  ? Breast cancer Paternal Grandmother   ?     dx in her mid to late 54s  ? Aneurysm  Maternal Aunt 26  ?     Brain  ? Kidney disease Maternal Uncle   ? Cancer Other   ?     2 maternal great uncles with cancer NOS  ? Breast cancer Maternal Aunt 72  ?     Breast cancer  ? Breast cancer Maternal Aunt 72  ? ? ?Social History  ? ?Socioeconomic History  ? Marital status: Single  ?  Spouse name: Not on file  ? Number of children: 0  ? Years of education: Not on file  ? Highest education level: Not on file  ?Occupational History  ? Not on file  ?Tobacco Use  ? Smoking status: Never  ? Smokeless tobacco: Never  ?Vaping Use  ? Vaping Use: Never used  ?Substance and Sexual Activity  ? Alcohol use: Yes  ?  Comment: 1 drink per month  ? Drug use: Never  ? Sexual activity: Yes  ?  Partners: Male  ?  Birth control/protection: Condom  ?  Comment: condoms everytime, not sexually active since 4/22  ?Other Topics Concern  ? Not on file  ?Social History Narrative  ? 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 ?Social Determinants of Health  ? ?Financial Resource Strain: Not on file  ?Food Insecurity: Not on file  ?Transportation Needs: Not on file  ?Physical Activity: Not on file  ?Stress: Not on file  ?Social Connections: Not on file  ?Intimate Partner Violence: Not on file  ? ? ?Review of Systems: ?See HPI, otherwise negative ROS ? ?Physical Exam: ?Vital signs in last 24 hours: ?Temp:  [98.4 ?F (36.9 ?C)] 98.4 ?F (36.9 ?C) (03/28 5277) ?Pulse Rate:  [72] 72 (03/28 0917) ?Resp:  [18] 18 (03/28 0917) ?BP: (111)/(73) 111/73 (03/28 8242) ?SpO2:  [98 %] 98 % (03/28 0917) ?Weight:  [101.6 kg] 101.6 kg (03/28 0917) ?  ?General:   Alert,  Well-developed, well-nourished, pleasant and cooperative in NAD ?Head:  Normocephalic and atraumatic. ?Eyes:  Sclera clear, no icterus.   Conjunctiva pink. ?Ears:  Normal auditory acuity. ?Nose:  No deformity, discharge,  or lesions. ?Mouth:  No deformity or lesions, dentition normal. ?Neck:  Supple; no masses or thyromegaly. ?Lungs:  Clear throughout to  auscultation.   No wheezes, crackles, or rhonchi. No acute distress. ?Heart:  Regular rate and rhythm; no murmurs, clicks, rubs,  or gallops. ?Abdomen:  Soft, nontender and nondistended. No masses, hepatosplenomegaly or hernias noted. Normal bowel sounds, without guarding, and without rebound.   ?Msk:  Symmetrical without gross deformities. Normal posture. ?Extremities:  Without clubbing or edema. ?Neurologic:  Alert and  oriented x4;  grossly normal neurologically. ?Skin:  Intact without significant lesions or rashes. ?Cervical Nodes:  No significant cervical adenopathy. ?Psych:  Alert and cooperative. Normal mood and affect. ? ?Impression/Plan: ?ALMINA SCHUL is here for a colonoscopy to be performed for colon cancer screening purposes. ? ?The risks of the procedure including infection, bleed, or perforation as well as benefits, limitations, alternatives and imponderables have been reviewed with the patient. Questions have been answered. All parties agreeable. ? ?

## 2021-06-07 NOTE — Discharge Instructions (Addendum)
  Colonoscopy Discharge Instructions  Read the instructions outlined below and refer to this sheet in the next few weeks. These discharge instructions provide you with general information on caring for yourself after you leave the hospital. Your doctor may also give you specific instructions. While your treatment has been planned according to the most current medical practices available, unavoidable complications occasionally occur.   ACTIVITY You may resume your regular activity, but move at a slower pace for the next 24 hours.  Take frequent rest periods for the next 24 hours.  Walking will help get rid of the air and reduce the bloated feeling in your belly (abdomen).  No driving for 24 hours (because of the medicine (anesthesia) used during the test).   Do not sign any important legal documents or operate any machinery for 24 hours (because of the anesthesia used during the test).  NUTRITION Drink plenty of fluids.  You may resume your normal diet as instructed by your doctor.  Begin with a light meal and progress to your normal diet. Heavy or fried foods are harder to digest and may make you feel sick to your stomach (nauseated).  Avoid alcoholic beverages for 24 hours or as instructed.  MEDICATIONS You may resume your normal medications unless your doctor tells you otherwise.  WHAT YOU CAN EXPECT TODAY Some feelings of bloating in the abdomen.  Passage of more gas than usual.  Spotting of blood in your stool or on the toilet paper.  IF YOU HAD POLYPS REMOVED DURING THE COLONOSCOPY: No aspirin products for 7 days or as instructed.  No alcohol for 7 days or as instructed.  Eat a soft diet for the next 24 hours.  FINDING OUT THE RESULTS OF YOUR TEST Not all test results are available during your visit. If your test results are not back during the visit, make an appointment with your caregiver to find out the results. Do not assume everything is normal if you have not heard from your  caregiver or the medical facility. It is important for you to follow up on all of your test results.  SEEK IMMEDIATE MEDICAL ATTENTION IF: You have more than a spotting of blood in your stool.  Your belly is swollen (abdominal distention).  You are nauseated or vomiting.  You have a temperature over 101.  You have abdominal pain or discomfort that is severe or gets worse throughout the day.   Your colonoscopy revealed 1 polyp(s) which I removed successfully. Await pathology results, my office will contact you. I recommend repeating colonoscopy in 5-10 years for surveillance purposes depending on pathology results. Otherwise follow up with GI as needed.    I hope you have a great rest of your week!  Charles K. Carver, D.O. Gastroenterology and Hepatology [facility]  

## 2021-06-07 NOTE — Anesthesia Postprocedure Evaluation (Signed)
Anesthesia Post Note ? ?Patient: Gail Erickson ? ?Procedure(s) Performed: COLONOSCOPY WITH PROPOFOL ?POLYPECTOMY ? ?Patient location during evaluation: Phase II ?Anesthesia Type: General ?Level of consciousness: awake ?Pain management: pain level controlled ?Vital Signs Assessment: post-procedure vital signs reviewed and stable ?Respiratory status: spontaneous breathing and respiratory function stable ?Cardiovascular status: blood pressure returned to baseline and stable ?Postop Assessment: no headache and no apparent nausea or vomiting ?Anesthetic complications: no ?Comments: Late entry ? ? ?No notable events documented. ? ? ?Last Vitals:  ?Vitals:  ? 06/07/21 0917 06/07/21 1033  ?BP: 111/73 115/60  ?Pulse: 72 71  ?Resp: 18 20  ?Temp: 36.9 ?C 36.6 ?C  ?SpO2: 98% 99%  ?  ?Last Pain:  ?Vitals:  ? 06/07/21 1033  ?TempSrc: Oral  ?PainSc: 0-No pain  ? ? ?  ?  ?  ?  ?  ?  ? ?Louann Sjogren ? ? ? ? ?

## 2021-06-08 LAB — SURGICAL PATHOLOGY

## 2021-06-10 ENCOUNTER — Encounter (HOSPITAL_COMMUNITY): Payer: Self-pay | Admitting: Internal Medicine

## 2021-06-14 DIAGNOSIS — F4323 Adjustment disorder with mixed anxiety and depressed mood: Secondary | ICD-10-CM | POA: Diagnosis not present

## 2021-06-22 DIAGNOSIS — F4323 Adjustment disorder with mixed anxiety and depressed mood: Secondary | ICD-10-CM | POA: Diagnosis not present

## 2021-06-29 DIAGNOSIS — F4323 Adjustment disorder with mixed anxiety and depressed mood: Secondary | ICD-10-CM | POA: Diagnosis not present

## 2021-07-04 ENCOUNTER — Ambulatory Visit: Payer: BC Managed Care – PPO | Admitting: Surgical

## 2021-07-04 DIAGNOSIS — Z9011 Acquired absence of right breast and nipple: Secondary | ICD-10-CM

## 2021-07-04 NOTE — Progress Notes (Signed)
NIPPLE AREOLAR TATTOO PROCEDURE ? ?PREOPERATIVE DIAGNOSIS:  ?Acquired absence of right nipple areolar  ? ?POSTOPERATIVE DIAGNOSIS: ?Acquired absence of right nipple areolar   ? ?PROCEDURES: right nipple areolar tattoo  ? ?ANESTHESIA:  23% lidocaine, 7% tetracaine ointment ? ?COMPLICATIONS: None. ? ?JUSTIFICATION FOR PROCEDURE:  ?Gail Erickson is a 46 y.o. female with a history of breast cancer status post breast reconstruction. The patient presents for nipple areolar complex tattoo. Risks, benefits, indications, and alternatives of the above described procedures were discussed with the patient and all the patient's questions were answered. Consent was signed. ? ?DESCRIPTION OF PROCEDURE: ?Informed consent was obtained and proper identification of patient and surgical site obtained. The patient was prepped and draped in the usual sterile fashion. Attention was turned to the selection of flesh colored permanent tattoo ink to produce the appropriate color for nipple areolar complex tattooing. Pre-op photos were obtained and placed in patient chart with patient consent. Using a Digital Revo 7R pronged tattoo head pigment was instilled to the designed nipple areolar complex, which was confirmed preoperatively with the patient. Once adequate pigment had been applied to the nipple areolar complex, vaseline followed by an occlusive dressing was applied. The patient tolerated the procedure well. There were no complications ? ?A 1:1 ratio of Warm Mink and Dark Mink was used to create the desired color. ?1 Part Warm Mink -  Exp 03/2024  ?1 part Dark Mink -  Exp 03/2024  ? ?Pictures were obtained of the patient and placed in the chart with the patient's or guardian's permission. ? ?Plan: ? ?At the next appointment, plan to add additional color throughout the entire areola for uniformity in the color. Plan to add additional dark shading under the nipple. Add additional texturing as needed.  ? ? ?

## 2021-07-06 DIAGNOSIS — F4323 Adjustment disorder with mixed anxiety and depressed mood: Secondary | ICD-10-CM | POA: Diagnosis not present

## 2021-07-13 DIAGNOSIS — F4323 Adjustment disorder with mixed anxiety and depressed mood: Secondary | ICD-10-CM | POA: Diagnosis not present

## 2021-07-20 ENCOUNTER — Ambulatory Visit: Payer: BC Managed Care – PPO | Admitting: Surgical

## 2021-07-20 DIAGNOSIS — F4323 Adjustment disorder with mixed anxiety and depressed mood: Secondary | ICD-10-CM | POA: Diagnosis not present

## 2021-07-27 DIAGNOSIS — F4323 Adjustment disorder with mixed anxiety and depressed mood: Secondary | ICD-10-CM | POA: Diagnosis not present

## 2021-07-28 ENCOUNTER — Other Ambulatory Visit (HOSPITAL_COMMUNITY)
Admission: RE | Admit: 2021-07-28 | Discharge: 2021-07-28 | Disposition: A | Discharge status: Home / Self Care | Payer: BC Managed Care – PPO | Source: Ambulatory Visit | Attending: Obstetrics and Gynecology | Admitting: Obstetrics and Gynecology

## 2021-07-28 ENCOUNTER — Encounter: Payer: Self-pay | Admitting: Obstetrics and Gynecology

## 2021-07-28 ENCOUNTER — Ambulatory Visit (INDEPENDENT_AMBULATORY_CARE_PROVIDER_SITE_OTHER): Payer: BC Managed Care – PPO | Admitting: Obstetrics and Gynecology

## 2021-07-28 VITALS — BP 122/80 | HR 78 | Resp 12 | Ht 67.75 in | Wt 233.0 lb

## 2021-07-28 DIAGNOSIS — Z1159 Encounter for screening for other viral diseases: Secondary | ICD-10-CM

## 2021-07-28 DIAGNOSIS — B3731 Acute candidiasis of vulva and vagina: Secondary | ICD-10-CM | POA: Diagnosis not present

## 2021-07-28 DIAGNOSIS — Z01419 Encounter for gynecological examination (general) (routine) without abnormal findings: Secondary | ICD-10-CM | POA: Insufficient documentation

## 2021-07-28 DIAGNOSIS — R300 Dysuria: Secondary | ICD-10-CM | POA: Diagnosis not present

## 2021-07-28 DIAGNOSIS — E7800 Pure hypercholesterolemia, unspecified: Secondary | ICD-10-CM | POA: Diagnosis not present

## 2021-07-28 DIAGNOSIS — Z87410 Personal history of cervical dysplasia: Secondary | ICD-10-CM | POA: Diagnosis not present

## 2021-07-28 DIAGNOSIS — Z114 Encounter for screening for human immunodeficiency virus [HIV]: Secondary | ICD-10-CM

## 2021-07-28 DIAGNOSIS — N760 Acute vaginitis: Secondary | ICD-10-CM | POA: Diagnosis not present

## 2021-07-28 DIAGNOSIS — Z113 Encounter for screening for infections with a predominantly sexual mode of transmission: Secondary | ICD-10-CM

## 2021-07-28 DIAGNOSIS — E7841 Elevated Lipoprotein(a): Secondary | ICD-10-CM | POA: Diagnosis not present

## 2021-07-28 DIAGNOSIS — E559 Vitamin D deficiency, unspecified: Secondary | ICD-10-CM | POA: Diagnosis not present

## 2021-07-28 NOTE — Progress Notes (Signed)
46 y.o. G0P0000 Single African American female here for annual exam.    Taking Tamoxifen.  Has vulvar/vaginal irritative symptoms.  Sometimes has thick discharge and odor at times.  She wants vaginitis and comprehensive STD testing.   Had painful urination last week, but not today.  She wants evaluation for this also.   She has her period every month, but not exact to the day.  No bleeding in between menses.   She wants a pap and HR HPV testing today, even if she needs to pay for it.   Quit her job in February.   PCP:   Vena Rua, FNP   Patient's last menstrual period was 07/07/2021.     Period Cycle (Days): 30 Period Duration (Days): 4 Period Pattern: Regular Menstrual Flow: Moderate Menstrual Control: Maxi pad Dysmenorrhea: (!) Mild Dysmenorrhea Symptoms: Headache     Sexually active: No. Laset intercourse Feb 2023 The current method of family planning is none.    Exercising: Yes.     walking Smoker:  no  Health Maintenance: Pap:  07-26-20 ASCUS, HR HPV negative           06-02-19 negative, HR HPV negative  History of abnormal Pap:  yes MMG:  01-31-21 BIRADS 1 negative.  She is due for MRI now and will call oncology to schedule this.  Colonoscopy:  06-07-21 Dr. Abbey Chatters 10 year follow up  BMD:   n/a  Result  n/a TDaP:  2017 Gardasil:   no HIV: 07-26-20 negative  Hep C: donated blood in the past  Screening Labs:  Hb today: [place], Urine today: results pending   reports that she has never smoked. She has never used smokeless tobacco. She reports current alcohol use. She reports that she does not use drugs.  Past Medical History:  Diagnosis Date   Abnormal Pap smear of cervix 06/02/2014   LGSIL:neg HR HPV (1st abn. 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   Migraine without  aura    Pre-diabetes    Prediabetes 2021   sees Dr.Balan   Vitamin D deficiency 05/21/2015   level = 10    Past Surgical History:  Procedure Laterality Date   BREAST RECONSTRUCTION WITH PLACEMENT OF TISSUE EXPANDER AND FLEX HD (ACELLULAR HYDRATED DERMIS) Right 04/28/2020   Procedure: IMMEDIATE RIGHT BREAST RECONSTRUCTION WITH PLACEMENT OF TISSUE EXPANDER AND FLEX HD (ACELLULAR HYDRATED DERMIS);  Surgeon: Wallace Going, DO;  Location: [place];  Service: Plastics;  Laterality: Right;   COLONOSCOPY WITH PROPOFOL N/A 06/07/2021   Procedure: COLONOSCOPY WITH PROPOFOL;  Surgeon: Eloise Harman, DO;  [facility];  Service: Endoscopy;  Laterality: N/A;  10:30 / ASA 2   COLPOSCOPY     LIPOSUCTION WITH LIPOFILLING Right 02/17/2021   Procedure: Lipofilling of right breast for symmetry;  Surgeon: Wallace Going, DO;  Location: [facility];  Service: Plastics;  Laterality: Right;  1.5 hour   MASTECTOMY W/ SENTINEL NODE BIOPSY Right 04/28/2020   Procedure: RIGHT MASTECTOMY WITH SENTINEL LYMPH NODE BIOPSY;  Surgeon: [name], MD;  Location: [facility];  Service: General;  Laterality: Right;  PECTORAL BLOCK   MASTOPEXY Left 09/23/2020   Procedure: LEFT  BREAST MASTOPEXY;  Surgeon: Wallace Going, DO;  Location: [facility];  Service: Plastics;  Laterality: Left;   POLYPECTOMY  06/07/2021   Procedure: POLYPECTOMY;  Surgeon: Eloise Harman, DO;  [facility];  Service: Endoscopy;;   REMOVAL OF TISSUE EXPANDER AND PLACEMENT OF IMPLANT Right 09/23/2020   Procedure: REMOVAL OF TISSUE EXPANDER AND PLACEMENT OF IMPLANT;  Surgeon: Wallace Going, DO;  Location: [place];  Service: Plastics;  Laterality: Right;  90 min   UMBILICAL HERNIA REPAIR     --as a child    Current Outpatient Medications  Medication Sig Dispense Refill   Ascorbic Acid (VITAMIN C) 1000 MG tablet Take 1,000 mg  by mouth every 14 (fourteen) days.     Aspirin-Acetaminophen-Caffeine (EXCEDRIN MIGRAINE PO) Take 1 tablet by mouth daily as needed (headache).     Multiple Vitamin (MULTIVITAMIN) tablet Take 1 tablet by mouth every 14 (fourteen) days.     Omega-3 Fatty Acids (FISH OIL) 1000 MG CAPS Take by mouth every 14 (fourteen) days.     Probiotic Product (PROBIOTIC-10 PO) Take 1 tablet by mouth every 14 (fourteen) days.     tamoxifen (NOLVADEX) 20 MG tablet TAKE 1 TABLET(20 MG) BY MOUTH DAILY 30 tablet 3   VITAMIN D PO Take 1,000 Int'l Units by mouth every 14 (fourteen) days.     No current facility-administered medications for this visit.    Family History  Problem Relation Age of Onset   Breast cancer Mother 62   Diabetes Mother    Hypertension Mother    Hyperlipidemia Mother    Thyroid disease Mother    Transient ischemic attack Mother    Breast cancer Paternal Aunt 67       bil. Breast ca--deceased CHF   Breast cancer Cousin 58       maternal first cousin died Breast ca age 41   Hyperlipidemia Father    HIV/AIDS Maternal Uncle    Cancer Paternal Uncle        NOS - needed BMT   Cancer Maternal Grandmother 69       adrenal gland cancer   Breast cancer Paternal Grandmother        dx in her mid to late 3s   Aneurysm Maternal Aunt 26       Brain   Kidney disease Maternal Uncle    Cancer Other        2 maternal great uncles with cancer NOS   Breast cancer Maternal Aunt 72       Breast cancer   Breast cancer Maternal Aunt 72    Review of Systems  Genitourinary:  Positive for dysuria.       Vaginal irritation  Vaginal dryness   All other systems reviewed and are negative.  Exam:   BP 122/80 (BP Location: Left Arm, Patient Position: Sitting, Cuff Size: Normal)   Pulse 78   Resp 12   Ht 5' 7.75" (1.721 m)   Wt 233 lb (105.7 kg)   LMP 07/07/2021   BMI 35.69 kg/m     General appearance: alert, cooperative and appears stated age Head: normocephalic, without obvious abnormality,  atraumatic Neck: no adenopathy, supple, symmetrical, trachea midline and thyroid normal to inspection and palpation Lungs: clear to auscultation bilaterally Breasts: right breast absent with nipple tattoo and implant present, no masses or tenderness, No nipple retraction or dimpling, No nipple discharge or bleeding, No axillary adenopathy Left breat with scar, no masses or tenderness,  No nipple retraction or dimpling, No nipple discharge or bleeding, No axillary adenopathy Heart: regular rate and rhythm Abdomen: soft, non-tender; no masses, no organomegaly Extremities: extremities normal, atraumatic, no cyanosis or edema Skin: skin color, texture, turgor normal. No rashes or lesions Lymph nodes: cervical, supraclavicular, and axillary nodes normal. Neurologic: grossly normal  Pelvic: External genitalia:  no lesions              No abnormal inguinal nodes palpated.              Urethra:  normal appearing urethra with no masses, tenderness or lesions              Bartholins and Skenes: normal                 Vagina: normal appearing vagina with normal color and discharge, no lesions              Cervix: no lesions              Pap taken: yes Bimanual Exam:  Uterus:  normal size, contour, position, consistency, mobility, non-tender              Adnexa: no mass, fullness, tenderness              Rectal exam: yes.  Confirms.              Anus:  normal sphincter tone, no lesions  Chaperone was present for exam:  Maudie Mercury, CMA  Assessment:   Well woman visit with gynecologic exam. Hx prior LGSIL.  Neg HR HPV.  Hx elevated DHEAS.  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  Care through endocrinology.  Prediabetes.  Right breast cancer.  Negative genetic testing.  Status post right mastectomy with left mastopexy.  On Tamoxifen.  Hx low vit D.  HSV 1.  Hx migraine without aura.  Vaginitis.  STD screening.  Vaginal atrophy. Dysuria, but not this week.   Plan: Mammogram screening discussed. Self breast awareness  reviewed. Pap and HR HPV as above. Guidelines for Calcium, Vitamin D, regular exercise program including cardiovascular and weight bearing exercise. STD screening.  Vaginitis testing.  Check cholesterol and vit D per request.  Urinalysis with reflex culture.  Urinalysis 0 -5 WBC, NS RBC, 6 - 10 squams, mod bacteria.  Patient declines abx today and is able to wait for the final culture prior to to doing any potential treatment.  Vit E and cooking oils for vaginal hydration.  Follow up annually and prn.   After visit summary provided.

## 2021-07-28 NOTE — Patient Instructions (Signed)

## 2021-07-29 ENCOUNTER — Other Ambulatory Visit: Payer: Self-pay | Admitting: *Deleted

## 2021-07-29 LAB — LIPID PANEL
Cholesterol: 205 mg/dL — ABNORMAL HIGH (ref <200)
HDL: 54 mg/dL (ref >=50)
LDL Cholesterol (Calc): 133 mg/dL (calc) — ABNORMAL HIGH
Non-HDL Cholesterol (Calc): 151 mg/dL (calc) — ABNORMAL HIGH (ref <130)
Total CHOL/HDL Ratio: 3.8 (calc) (ref <5.0)
Triglycerides: 83 mg/dL (ref <150)

## 2021-07-29 LAB — CERVICOVAGINAL ANCILLARY ONLY
Bacterial Vaginitis (gardnerella): POSITIVE — AB
Candida Glabrata: NEGATIVE
Candida Vaginitis: POSITIVE — AB
Chlamydia: NEGATIVE
Comment: NEGATIVE
Comment: NEGATIVE
Comment: NEGATIVE
Comment: NEGATIVE
Comment: NEGATIVE
Comment: NORMAL
Neisseria Gonorrhea: NEGATIVE
Trichomonas: NEGATIVE

## 2021-07-29 LAB — HEPATITIS C ANTIBODY
Hepatitis C Ab: NONREACTIVE
SIGNAL TO CUT-OFF: 0.12 (ref <1.00)

## 2021-07-29 LAB — HIV ANTIBODY (ROUTINE TESTING W REFLEX): HIV 1&2 Ab, 4th Generation: NONREACTIVE

## 2021-07-29 LAB — VITAMIN D 25 HYDROXY (VIT D DEFICIENCY, FRACTURES): Vit D, 25-Hydroxy: 23 ng/mL — ABNORMAL LOW (ref 30–100)

## 2021-07-29 MED ORDER — METRONIDAZOLE 500 MG PO TABS: 500.0000 mg | ORAL_TABLET | Freq: Two times a day (BID) | ORAL | 0 refills | Status: DC

## 2021-07-29 MED ORDER — FLUCONAZOLE 150 MG PO TABS: ORAL_TABLET | ORAL | 0 refills | Status: DC

## 2021-07-30 LAB — URINALYSIS, COMPLETE W/RFL CULTURE
Bilirubin Urine: NEGATIVE
Casts: NONE SEEN /LPF
Crystals: NONE SEEN /HPF
Glucose, UA: NEGATIVE
Hgb urine dipstick: NEGATIVE
Hyaline Cast: NONE SEEN /LPF
Ketones, ur: NEGATIVE
Nitrites, Initial: NEGATIVE
Protein, ur: NEGATIVE
RBC / HPF: NONE SEEN /HPF (ref 0–2)
Specific Gravity, Urine: 1.015 (ref 1.001–1.035)
pH: 5.5 (ref 5.0–8.0)

## 2021-07-30 LAB — CULTURE INDICATED

## 2021-07-30 LAB — URINE CULTURE
MICRO NUMBER:: 13414145
SPECIMEN QUALITY:: ADEQUATE

## 2021-08-02 ENCOUNTER — Encounter (INDEPENDENT_AMBULATORY_CARE_PROVIDER_SITE_OTHER): Payer: Self-pay

## 2021-08-02 DIAGNOSIS — Z0289 Encounter for other administrative examinations: Secondary | ICD-10-CM

## 2021-08-02 LAB — CYTOLOGY - PAP
Adequacy: ABSENT
Comment: NEGATIVE
Diagnosis: NEGATIVE
High risk HPV: NEGATIVE

## 2021-08-03 DIAGNOSIS — F4323 Adjustment disorder with mixed anxiety and depressed mood: Secondary | ICD-10-CM | POA: Diagnosis not present

## 2021-08-04 ENCOUNTER — Ambulatory Visit: Payer: BC Managed Care – PPO | Admitting: Surgical

## 2021-08-04 DIAGNOSIS — Z9011 Acquired absence of right breast and nipple: Secondary | ICD-10-CM | POA: Diagnosis not present

## 2021-08-04 DIAGNOSIS — C50811 Malignant neoplasm of overlapping sites of right female breast: Secondary | ICD-10-CM

## 2021-08-04 NOTE — Progress Notes (Signed)
NIPPLE AREOLAR TATTOO PROCEDURE  PREOPERATIVE DIAGNOSIS:  Acquired absence of right nipple areolar   POSTOPERATIVE DIAGNOSIS: Acquired absence of right nipple areolar    PROCEDURES: right nipple areolar tattoo   ANESTHESIA:  4% lidocaine ointment  COMPLICATIONS: None.  JUSTIFICATION FOR PROCEDURE:  Gail Erickson is a 46 y.o. female with a history of breast cancer status post breast reconstruction. The patient presents for nipple areolar complex tattoo. Risks, benefits, indications, and alternatives of the above described procedures were discussed with the patient and all the patient's questions were answered. Consent was signed.  DESCRIPTION OF PROCEDURE: After informed consent was obtained and proper identification of patient and surgical site was made, the patient was taken to the procedure room and resting in procedure chair. The patient was prepped and draped in the usual sterile fashion. Attention was turned to the selection of flesh colored permanent tattoo ink to produce the appropriate color for nipple areolar complex tattooing. Color, size and location was confirmed with the patient. Pre-op photos were obtained and placed in patient chart with patient consent. Using a Digital Revo 7R pronged tattoo head,  pigment was instilled to the designed nipple areolar complex, which was confirmed preoperatively with the patient. Once adequate pigment had been applied to the nipple areolar complex, vaseline followed by an occlusive dressing was applied. The patient tolerated the procedure well. There were no complications  A 1:1 ratio of warm mink and dark mink was used to create the desired color for the areola. 1 part warm mink -  Exp 03/2024  1 part dark mink -  Exp 03/2024   dark mink and bright peach were used to create the desired color for the central nipple. 2 part cool mink - exp 1/26 5 part bright peach - exp 1/26  The entire area tattooed was approximately 4x4 cm for a total of 16  square centimeter.   Patient overall has a great result, do not feel as if any additional tattooing will be necessary unless the pigment fades where she has any changes that concern her.  Pictures were taken and placed in the patient's chart with patient's permission.  We discussed calling with any questions or concerns.

## 2021-08-10 DIAGNOSIS — F4323 Adjustment disorder with mixed anxiety and depressed mood: Secondary | ICD-10-CM | POA: Diagnosis not present

## 2021-08-12 ENCOUNTER — Other Ambulatory Visit: Payer: Self-pay

## 2021-08-12 DIAGNOSIS — C50811 Malignant neoplasm of overlapping sites of right female breast: Secondary | ICD-10-CM

## 2021-08-14 NOTE — Progress Notes (Deleted)
Gail Erickson   [phone] [phone]   Clinic Follow up Note   Patient Care Team: Renee Rival, FNP as PCP - General (Nurse Practitioner) [name], MD as Consulting Physician (Obstetrics and Gynecology) [name], MD as Consulting Physician (General Surgery) [name], MD as Consulting Physician (Hematology) [name], RN as Oncology Nurse Navigator [name], RN as Oncology Nurse Navigator Alla Feeling, NP as Nurse Practitioner (Nurse Practitioner) 08/14/2021  CHIEF COMPLAINT: Follow up right breast cancer  SUMMARY OF ONCOLOGIC HISTORY: Oncology History Overview Note  Cancer Staging Malignant neoplasm of overlapping sites of right breast [facility]) Staging form: Breast, AJCC 8th Edition - Clinical stage from 02/25/2020: Stage IIB (cT3, cN0, cM0, G3, ER+, PR+, HER2-) - Signed by [name], MD on 02/25/2020 - Pathologic stage from 04/28/2020: Stage IA (pT1a, pN0, cM0, G2, ER+, PR+, HER2-) - Signed by [name], MD on 05/06/2020 Histologic grading system: 3 grade system    Malignant neoplasm of overlapping sites of right breast ([place])  01/30/2020 Mammogram   Mammogram 01/30/20  IMPRESSION: 1. 7.3 x 5.7 x 3.0 cm area of indeterminate calcifications in medial right breast, involving the lower inner and upper inner quadrants, these extend from the retroareolar region posteriorly, in a segmental distribution, suspicious for DCIS. 2. Two probably benign left breast masses and 2 probably benign left axillary lymph nodes with borderline cortical thickening. 3. Benign right breast cyst.   02/13/2020 Initial Biopsy   Diagnosis 02/13/20 1. Breast, right, needle core biopsy, posterior extent - DUCTAL CARCINOMA IN SITU WITH CALCIFICATIONS AND NECROSIS - SEE COMMENT 2. Breast, right, needle core biopsy, mid-anterior extent - INVASIVE DUCTAL CARCINOMA - DUCTAL CARCINOMA IN SITU - SEE COMMENT Microscopic Comment 1.  Based on the biopsy, the ductal carcinoma in situ has a solid pattern, high nuclear grade and measures 0.5 cm in greatest linear extent. Prognostic markers (ER/PR) are pending and will be reported in an addendum. Dr. Jeannie Done reviewed the case and agrees with the above diagnosis. These results were called to [facility] on February 16, 2020. 2. Based on the biopsy, the carcinoma appears Nottingham grade 3 of 3 and measures 0.15 cm in greatest linear extent. Prognostic markers (ER/PR/ki-67/HER2) are pending and will be reported in an addendum.   02/13/2020 Receptors her2    1. PROGNOSTIC INDICATORS Results: IMMUNOHISTOCHEMICAL AND MORPHOMETRIC ANALYSIS PERFORMED MANUALLY Estrogen Receptor: >95%, POSITIVE, MODERATE-STRONG STAINING INTENSITY Progesterone Receptor: 5%, POSITIVE, MODERATE-STRONG STAINING INTENSITY  2. PROGNOSTIC INDICATORS Results: IMMUNOHISTOCHEMICAL AND MORPHOMETRIC ANALYSIS PERFORMED MANUALLY The tumor cells are NEGATIVE for Her2 (0). Estrogen Receptor: >95%, POSITIVE, STRONG STAINING INTENSITY Progesterone Receptor: 60%, POSITIVE, STRONG STAINING INTENSITY Proliferation Marker Ki67: 1%   02/21/2020 Breast MRI   Breast MRI 02/21/20  IMPRESSION: 1. Involving the sites of biopsy proven DCIS in the medial right breast there is a large area of ill-defined non mass enhancement middle to anterior depth, somewhat difficult to measure due to irregular margins and background enhancement but measures at least 6.3 x 3.3 x 2.0 cm. 2. No MRI evidence of malignancy in the left breast. 3. No axillary adenopathy.   02/25/2020 Initial Diagnosis   Malignant neoplasm of overlapping sites of right breast ([place])    02/25/2020 Cancer Staging   Staging form: Breast, AJCC 8th Edition - Clinical stage from 02/25/2020: Stage IIB (cT3, cN0, cM0, G3, ER+, PR+, HER2-) - Signed by [name], MD on 02/25/2020    02/27/2020 Pathology Results  Diagnosis Lymph node,  needle/core biopsy, axillary - NO CARCINOMA IDENTIFIED IN NODAL TISSUE Microscopic Comment These results were called to [facility] on March 09, 2020.   04/28/2020 Surgery   RIGHT MASTECTOMY WITH SENTINEL LYMPH NODE BIOPSY by DR Marlou Starks  IMMEDIATE RIGHT BREAST RECONSTRUCTION WITH PLACEMENT OF TISSUE EXPANDER AND FLEX HD (ACELLULAR HYDRATED DERMIS) by Dr Marla Roe   04/28/2020 Pathology Results   FINAL MICROSCOPIC DIAGNOSIS:   A. 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 LYMPH NODE, RIGHT AXILLARY, SENTINEL, EXCISION:  -  No carcinoma identified in one lymph node (0/1)    04/28/2020 Cancer Staging   Staging form: Breast, AJCC 8th Edition - Pathologic stage from 04/28/2020: Stage IA (pT1a, pN0, cM0, G2, ER+, PR+, HER2-) - Signed by [name], MD on 05/06/2020 Histologic grading system: 3 grade system    07/10/2020 -  Anti-estrogen oral therapy   Tamoxifen 86m once daily starting 07/10/20   11/04/2020 Survivorship   SCP delivered by LCira Rue NP     CURRENT THERAPY: Tamoxifen 20 mg once daily starting 07/10/20  INTERVAL HISTORY: Ms. BRochettereturns for follow up as scheduled. Last seen by Dr. FBurr Medico12/2/22. She continues tamoxifen. She has undergone age appropriate cancer screenings in the interim. A screening breast MRI is scheduled next week.    REVIEW OF SYSTEMS:   Constitutional: Denies fevers, chills or abnormal weight loss Eyes: Denies blurriness of vision Ears, nose, mouth, throat, and face: Denies mucositis or sore throat Respiratory: Denies cough, dyspnea or wheezes Cardiovascular: Denies palpitation, chest discomfort or lower extremity swelling Gastrointestinal:  Denies nausea,  heartburn or change in bowel habits Skin: Denies abnormal skin rashes Lymphatics: Denies new lymphadenopathy or easy bruising Neurological:Denies numbness, tingling or new weaknesses Behavioral/Psych: Mood is stable, no new changes  All other systems were reviewed with the patient and are negative.  MEDICAL HISTORY:  Past Medical History:  Diagnosis Date   Abnormal Pap smear of cervix 06/02/2014   LGSIL:neg HR HPV (1st abn. 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   Migraine without aura    Pre-diabetes    Prediabetes 2021   sees Dr.Balan   Vitamin D deficiency 05/21/2015   level = 10    SURGICAL HISTORY: Past Surgical History:  Procedure Laterality Date   BREAST RECONSTRUCTION WITH PLACEMENT OF TISSUE EXPANDER AND FLEX HD (ACELLULAR HYDRATED DERMIS) Right 04/28/2020   Procedure: IMMEDIATE RIGHT BREAST RECONSTRUCTION WITH PLACEMENT OF TISSUE EXPANDER AND FLEX HD (ACELLULAR HYDRATED DERMIS);  Surgeon: DWallace Going DO;  [facility]  Service: Plastics;  Laterality: Right;   COLONOSCOPY WITH PROPOFOL N/A 06/07/2021   Procedure: COLONOSCOPY WITH PROPOFOL;  Surgeon: CEloise Harman DO;  [facility];  Service: Endoscopy;  Laterality: N/A;  10:30 / ASA 2   COLPOSCOPY     LIPOSUCTION WITH LIPOFILLING Right 02/17/2021   Procedure: Lipofilling of right breast  for symmetry;  Surgeon: Wallace Going, DO;  Location: [place];  Service: Plastics;  Laterality: Right;  1.5 hour   MASTECTOMY W/ SENTINEL NODE BIOPSY Right 04/28/2020   Procedure: RIGHT MASTECTOMY WITH SENTINEL LYMPH NODE BIOPSY;  Surgeon: [name], MD;  Location: [place];  Service: General;  Laterality: Right;  PECTORAL BLOCK   MASTOPEXY Left 09/23/2020    Procedure: LEFT BREAST MASTOPEXY;  Surgeon: Wallace Going, DO;  Location: [place];  Service: Plastics;  Laterality: Left;   POLYPECTOMY  06/07/2021   Procedure: POLYPECTOMY;  Surgeon: Eloise Harman, DO;  [facility];  Service: Endoscopy;;   REMOVAL OF TISSUE EXPANDER AND PLACEMENT OF IMPLANT Right 09/23/2020   Procedure: REMOVAL OF TISSUE EXPANDER AND PLACEMENT OF IMPLANT;  Surgeon: Wallace Going, DO;  Location: [place];  Service: Plastics;  Laterality: Right;  90 min   UMBILICAL HERNIA REPAIR     --as a child    I have reviewed the social history and family history with the patient and they are unchanged from previous note.  ALLERGIES:  has No Known Allergies.  MEDICATIONS:  Current Outpatient Medications  Medication Sig Dispense Refill   Ascorbic Acid (VITAMIN C) 1000 MG tablet Take 1,000 mg by mouth every 14 (fourteen) days.     Aspirin-Acetaminophen-Caffeine (EXCEDRIN MIGRAINE PO) Take 1 tablet by mouth daily as needed (headache).     fluconazole (DIFLUCAN) 150 MG tablet Take one tablet by mouth now and repeat in 72 hours. 2 tablet 0   metroNIDAZOLE (FLAGYL) 500 MG tablet Take 1 tablet (500 mg total) by mouth 2 (two) times daily. 14 tablet 0   Multiple Vitamin (MULTIVITAMIN) tablet Take 1 tablet by mouth every 14 (fourteen) days.     Omega-3 Fatty Acids (FISH OIL) 1000 MG CAPS Take by mouth every 14 (fourteen) days.     Probiotic Product (PROBIOTIC-10 PO) Take 1 tablet by mouth every 14 (fourteen) days.     tamoxifen (NOLVADEX) 20 MG tablet TAKE 1 TABLET(20 MG) BY MOUTH DAILY 30 tablet 3   VITAMIN D PO Take 1,000 Int'l Units by mouth every 14 (fourteen) days.     No current facility-administered medications for this visit.    PHYSICAL EXAMINATION: ECOG PERFORMANCE STATUS: {CHL ONC ECOG PS:[phone]}  There were no vitals filed for this visit. There were no vitals filed for this visit.  GENERAL:alert, no distress  and comfortable SKIN: skin color, texture, turgor are normal, no rashes or significant lesions EYES: normal, Conjunctiva are pink and non-injected, sclera clear OROPHARYNX:no exudate, no erythema and lips, buccal mucosa, and tongue normal  NECK: supple, thyroid normal size, non-tender, without nodularity LYMPH:  no palpable lymphadenopathy in the cervical, axillary or inguinal LUNGS: clear to auscultation and percussion with normal breathing effort HEART: regular rate & rhythm and no murmurs and no lower extremity edema ABDOMEN:abdomen soft, non-tender and normal bowel sounds Musculoskeletal:no cyanosis of digits and no clubbing  NEURO: alert & oriented x 3 with fluent speech, no focal motor/sensory deficits  LABORATORY DATA:  I have reviewed the data as listed    Latest Ref Rng & Units 02/11/2021    3:32 PM 08/06/2020    9:01 AM 07/26/2020   12:40 PM  CBC  WBC 4.0 - 10.5 K/uL 4.1   3.9   4.5    Hemoglobin 12.0 - 15.0 g/dL 11.4   11.4   11.8    Hematocrit 36.0 - 46.0 %  34.0   32.7   35.1    Platelets 150 - 400 K/uL 295   271   286          Latest Ref Rng & Units 02/11/2021    3:32 PM 08/06/2020    9:01 AM 07/26/2020   12:40 PM  CMP  Glucose 70 - 99 mg/dL 86   103   86    BUN 6 - 20 mg/dL _0 Creatinine 0.44 - 1.00 mg/dL 1.00   0.89   0.85    Sodium 135 - 145 mmol/L 139   143   140    Potassium 3.5 - 5.1 mmol/L 3.7   4.1   3.9    Chloride 98 - 111 mmol/L 106   109   106    CO2 22 - 32 mmol/L _1 Calcium 8.9 - 10.3 mg/dL 9.0   8.8   9.5    Total Protein 6.5 - 8.1 g/dL 7.3   6.9   7.3    Total Bilirubin 0.3 - 1.2 mg/dL 0.9   0.7   0.6    Alkaline Phos 38 - 126 U/L 40   45     AST 15 - 41 U/L _2 ALT 0 - 44 U/L 59   14   14        RADIOGRAPHIC STUDIES: I have personally reviewed the radiological images as listed and agreed with the findings in the report. No results found.   ASSESSMENT & PLAN:  No problem-specific Assessment & Plan notes  found for this encounter.   No orders of the defined types were placed in this encounter.  All questions were answered. The patient knows to call the clinic with any problems, questions or concerns. No barriers to learning was detected. I spent {CHL ONC TIME VISIT - JJOAC:[id number]} counseling the patient face to face. The total time spent in the appointment was {CHL ONC TIME VISIT - FUXNA:[id number]} and more than 50% was on counseling and review of test results     Alla Feeling, NP 08/14/21

## 2021-08-15 ENCOUNTER — Inpatient Hospital Stay: Payer: BC Managed Care – PPO | Admitting: Nurse Practitioner

## 2021-08-15 ENCOUNTER — Inpatient Hospital Stay: Payer: BC Managed Care – PPO | Attending: Nurse Practitioner

## 2021-08-15 DIAGNOSIS — Z9011 Acquired absence of right breast and nipple: Secondary | ICD-10-CM | POA: Insufficient documentation

## 2021-08-15 DIAGNOSIS — Z79899 Other long term (current) drug therapy: Secondary | ICD-10-CM | POA: Insufficient documentation

## 2021-08-15 DIAGNOSIS — D649 Anemia, unspecified: Secondary | ICD-10-CM | POA: Insufficient documentation

## 2021-08-15 DIAGNOSIS — C50811 Malignant neoplasm of overlapping sites of right female breast: Secondary | ICD-10-CM | POA: Insufficient documentation

## 2021-08-15 DIAGNOSIS — Z170 Estrogen receptor positive status [ER+]: Secondary | ICD-10-CM | POA: Insufficient documentation

## 2021-08-16 ENCOUNTER — Telehealth: Payer: Self-pay | Admitting: Nurse Practitioner

## 2021-08-16 NOTE — Telephone Encounter (Signed)
.  Called pt per 6/6 inbasket , Patient was unavailable, a message with appt time and date was left with number on file.

## 2021-08-17 DIAGNOSIS — F4323 Adjustment disorder with mixed anxiety and depressed mood: Secondary | ICD-10-CM | POA: Diagnosis not present

## 2021-08-23 ENCOUNTER — Other Ambulatory Visit: Payer: Self-pay | Admitting: Hematology

## 2021-08-23 ENCOUNTER — Ambulatory Visit
Admission: RE | Admit: 2021-08-23 | Discharge: 2021-08-23 | Disposition: A | Discharge status: Home / Self Care | Payer: BC Managed Care – PPO | Source: Ambulatory Visit | Attending: Hematology | Admitting: Hematology

## 2021-08-23 DIAGNOSIS — C50811 Malignant neoplasm of overlapping sites of right female breast: Secondary | ICD-10-CM

## 2021-08-23 DIAGNOSIS — C50911 Malignant neoplasm of unspecified site of right female breast: Secondary | ICD-10-CM | POA: Diagnosis not present

## 2021-08-23 DIAGNOSIS — Z803 Family history of malignant neoplasm of breast: Secondary | ICD-10-CM | POA: Diagnosis not present

## 2021-08-23 DIAGNOSIS — R928 Other abnormal and inconclusive findings on diagnostic imaging of breast: Secondary | ICD-10-CM

## 2021-08-23 MED ORDER — GADOBUTROL 1 MMOL/ML IV SOLN
10.0000 mL | Freq: Once | INTRAVENOUS | Status: AC | PRN
  Administered 2021-08-23: 10 mL via INTRAVENOUS

## 2021-08-24 DIAGNOSIS — F4323 Adjustment disorder with mixed anxiety and depressed mood: Secondary | ICD-10-CM | POA: Diagnosis not present

## 2021-08-31 ENCOUNTER — Ambulatory Visit
Admission: RE | Admit: 2021-08-31 | Discharge: 2021-08-31 | Disposition: A | Discharge status: Home / Self Care | Payer: BC Managed Care – PPO | Source: Ambulatory Visit | Attending: Hematology | Admitting: Hematology

## 2021-08-31 DIAGNOSIS — F4323 Adjustment disorder with mixed anxiety and depressed mood: Secondary | ICD-10-CM | POA: Diagnosis not present

## 2021-08-31 DIAGNOSIS — N6323 Unspecified lump in the left breast, lower outer quadrant: Secondary | ICD-10-CM | POA: Diagnosis not present

## 2021-08-31 DIAGNOSIS — R928 Other abnormal and inconclusive findings on diagnostic imaging of breast: Secondary | ICD-10-CM

## 2021-08-31 DIAGNOSIS — N6032 Fibrosclerosis of left breast: Secondary | ICD-10-CM | POA: Diagnosis not present

## 2021-08-31 LAB — HM MAMMOGRAPHY: HM Mammogram: NORMAL (ref 0–4)

## 2021-08-31 MED ORDER — GADOBUTROL 1 MMOL/ML IV SOLN
10.0000 mL | Freq: Once | INTRAVENOUS | Status: AC | PRN
  Administered 2021-08-31: 10 mL via INTRAVENOUS

## 2021-09-06 ENCOUNTER — Inpatient Hospital Stay: Payer: BC Managed Care – PPO

## 2021-09-06 ENCOUNTER — Encounter: Payer: Self-pay | Admitting: Nurse Practitioner

## 2021-09-06 ENCOUNTER — Inpatient Hospital Stay: Payer: BC Managed Care – PPO | Admitting: Nurse Practitioner

## 2021-09-06 ENCOUNTER — Other Ambulatory Visit: Payer: Self-pay

## 2021-09-06 VITALS — BP 128/79 | HR 60 | Temp 98.7°F | Resp 18 | Ht 67.5 in | Wt 243.7 lb

## 2021-09-06 DIAGNOSIS — Z170 Estrogen receptor positive status [ER+]: Secondary | ICD-10-CM | POA: Diagnosis not present

## 2021-09-06 DIAGNOSIS — D649 Anemia, unspecified: Secondary | ICD-10-CM | POA: Diagnosis not present

## 2021-09-06 DIAGNOSIS — Z79899 Other long term (current) drug therapy: Secondary | ICD-10-CM | POA: Diagnosis not present

## 2021-09-06 DIAGNOSIS — C50811 Malignant neoplasm of overlapping sites of right female breast: Secondary | ICD-10-CM

## 2021-09-06 DIAGNOSIS — Z9011 Acquired absence of right breast and nipple: Secondary | ICD-10-CM | POA: Diagnosis not present

## 2021-09-06 LAB — CBC WITH DIFFERENTIAL (CANCER CENTER ONLY)
Abs Immature Granulocytes: 0.01 10*3/uL (ref 0.00–0.07)
Basophils Absolute: 0 10*3/uL (ref 0.0–0.1)
Basophils Relative: 1 %
Eosinophils Absolute: 0.2 10*3/uL (ref 0.0–0.5)
Eosinophils Relative: 4 %
HCT: 31.4 % — ABNORMAL LOW (ref 36.0–46.0)
Hemoglobin: 10.8 g/dL — ABNORMAL LOW (ref 12.0–15.0)
Immature Granulocytes: 0 %
Lymphocytes Relative: 44 %
Lymphs Abs: 1.7 10*3/uL (ref 0.7–4.0)
MCH: 29.7 pg (ref 26.0–34.0)
MCHC: 34.4 g/dL (ref 30.0–36.0)
MCV: 86.3 fL (ref 80.0–100.0)
Monocytes Absolute: 0.3 10*3/uL (ref 0.1–1.0)
Monocytes Relative: 8 %
Neutro Abs: 1.7 10*3/uL (ref 1.7–7.7)
Neutrophils Relative %: 43 %
Platelet Count: 230 10*3/uL (ref 150–400)
RBC: 3.64 MIL/uL — ABNORMAL LOW (ref 3.87–5.11)
RDW: 13.8 % (ref 11.5–15.5)
WBC Count: 3.8 10*3/uL — ABNORMAL LOW (ref 4.0–10.5)
nRBC: 0 % (ref 0.0–0.2)

## 2021-09-06 LAB — IRON AND IRON BINDING CAPACITY (CC-WL,HP ONLY)
Iron: 67 ug/dL (ref 28–170)
Saturation Ratios: 22 % (ref 10.4–31.8)
TIBC: 309 ug/dL (ref 250–450)
UIBC: 242 ug/dL (ref 148–442)

## 2021-09-06 LAB — CMP (CANCER CENTER ONLY)
ALT: 18 U/L (ref 0–44)
AST: 23 U/L (ref 15–41)
Albumin: 3.8 g/dL (ref 3.5–5.0)
Alkaline Phosphatase: 34 U/L — ABNORMAL LOW (ref 38–126)
Anion gap: 4 — ABNORMAL LOW (ref 5–15)
BUN: 11 mg/dL (ref 6–20)
CO2: 26 mmol/L (ref 22–32)
Calcium: 9.1 mg/dL (ref 8.9–10.3)
Chloride: 112 mmol/L — ABNORMAL HIGH (ref 98–111)
Creatinine: 1.06 mg/dL — ABNORMAL HIGH (ref 0.44–1.00)
GFR, Estimated: >60 mL/min (ref >60)
Glucose, Bld: 100 mg/dL — ABNORMAL HIGH (ref 70–99)
Potassium: 3.8 mmol/L (ref 3.5–5.1)
Sodium: 142 mmol/L (ref 135–145)
Total Bilirubin: 0.6 mg/dL (ref 0.3–1.2)
Total Protein: 6.6 g/dL (ref 6.5–8.1)

## 2021-09-06 LAB — TSH: TSH: 0.687 u[IU]/mL (ref 0.350–4.500)

## 2021-09-06 LAB — VITAMIN B12: Vitamin B-12: 257 pg/mL (ref 180–914)

## 2021-09-06 LAB — FERRITIN: Ferritin: 77 ng/mL (ref 11–307)

## 2021-09-07 ENCOUNTER — Telehealth: Payer: Self-pay | Admitting: Hematology

## 2021-09-07 ENCOUNTER — Other Ambulatory Visit: Payer: Self-pay | Admitting: Nurse Practitioner

## 2021-09-07 ENCOUNTER — Encounter: Payer: Self-pay | Admitting: Licensed Clinical Social Worker

## 2021-09-07 DIAGNOSIS — F4323 Adjustment disorder with mixed anxiety and depressed mood: Secondary | ICD-10-CM | POA: Diagnosis not present

## 2021-09-07 DIAGNOSIS — C50811 Malignant neoplasm of overlapping sites of right female breast: Secondary | ICD-10-CM

## 2021-09-07 LAB — FOLATE RBC
Folate, Hemolysate: 475 ng/mL
Folate, RBC: 1435 ng/mL (ref >498)
Hematocrit: 33.1 % — ABNORMAL LOW (ref 34.0–46.6)

## 2021-09-07 NOTE — Addendum Note (Signed)
Encounter addended by: [name], MD on: 09/07/2021 7:45 PM  Actions taken: Clinical Note Signed

## 2021-09-07 NOTE — Progress Notes (Signed)
[facility] CSW Progress Note  Clinical Social Worker contacted patient by phone to discuss concerns regarding insurance coverage.  Pt stopped working in February and has been paying out of pocket for [place] coverage since that time.  Recently pt has needed imaging and medical procedures for which she needed to meet the out of pocket deductible for which has been a financial hardship for pt.  At present she is close to or has met the deductible.  Pt inquired if she should stop [employer] and explore market place options.  Writer encouraged pt to keep current [facility] in place as she may need additional treatment pending results of breast mass excision currently scheduled at the end of July.  If she has met the deductible at least 80% of medical cost will be covered.  Pt was receiving severance pay since February after leaving her job; however, those payments have now ended leaving pt w/ no income.  Pt will file for unemployment and has job interviews lined up.  CSW explained the [employer] as well as the [employer] and [employer].  Pt will need to be in active treatment to qualify for any grants.  Pending results of the biopsy following the excision CSW will assist pt with applying for grants and refer to financial resources.  Pt also expressed interest in the breast cancer support group and has been added to the list.  CSW to remain available as appropriate to assist with further needs should they arise.    Henriette Combs, LCSW

## 2021-09-07 NOTE — Telephone Encounter (Signed)
Scheduled follow-up appointment per 6/27 los. Patient is aware.

## 2021-09-07 NOTE — Progress Notes (Signed)
I agree with the dx of yeast and bacterial vaginosis.   Josefa Half, MD

## 2021-09-08 ENCOUNTER — Other Ambulatory Visit: Payer: Self-pay | Admitting: Nurse Practitioner

## 2021-09-08 DIAGNOSIS — D649 Anemia, unspecified: Secondary | ICD-10-CM

## 2021-09-08 NOTE — Progress Notes (Signed)
Per provider request, last office notes faxed to Dr. Chalmers Cater, Endocrinologist  Fax number [phone].

## 2021-09-09 ENCOUNTER — Encounter: Payer: Self-pay | Admitting: Nurse Practitioner

## 2021-09-14 ENCOUNTER — Encounter (INDEPENDENT_AMBULATORY_CARE_PROVIDER_SITE_OTHER): Payer: Self-pay | Admitting: Bariatrics

## 2021-09-14 ENCOUNTER — Ambulatory Visit (INDEPENDENT_AMBULATORY_CARE_PROVIDER_SITE_OTHER): Payer: BC Managed Care – PPO | Admitting: Bariatrics

## 2021-09-14 VITALS — BP 113/71 | HR 63 | Temp 98.3°F | Ht 68.0 in | Wt 241.0 lb

## 2021-09-14 DIAGNOSIS — Z6836 Body mass index (BMI) 36.0-36.9, adult: Secondary | ICD-10-CM

## 2021-09-14 DIAGNOSIS — F4323 Adjustment disorder with mixed anxiety and depressed mood: Secondary | ICD-10-CM | POA: Diagnosis not present

## 2021-09-14 DIAGNOSIS — Z1331 Encounter for screening for depression: Secondary | ICD-10-CM | POA: Diagnosis not present

## 2021-09-14 DIAGNOSIS — E7849 Other hyperlipidemia: Secondary | ICD-10-CM | POA: Diagnosis not present

## 2021-09-14 DIAGNOSIS — F5089 Other specified eating disorder: Secondary | ICD-10-CM

## 2021-09-14 DIAGNOSIS — E559 Vitamin D deficiency, unspecified: Secondary | ICD-10-CM

## 2021-09-14 DIAGNOSIS — R7303 Prediabetes: Secondary | ICD-10-CM | POA: Insufficient documentation

## 2021-09-14 DIAGNOSIS — E785 Hyperlipidemia, unspecified: Secondary | ICD-10-CM | POA: Diagnosis not present

## 2021-09-14 DIAGNOSIS — Z0000 Encounter for general adult medical examination without abnormal findings: Secondary | ICD-10-CM

## 2021-09-14 DIAGNOSIS — R0602 Shortness of breath: Secondary | ICD-10-CM | POA: Diagnosis not present

## 2021-09-14 DIAGNOSIS — R5383 Other fatigue: Secondary | ICD-10-CM

## 2021-09-14 DIAGNOSIS — F509 Eating disorder, unspecified: Secondary | ICD-10-CM | POA: Insufficient documentation

## 2021-09-15 ENCOUNTER — Encounter (INDEPENDENT_AMBULATORY_CARE_PROVIDER_SITE_OTHER): Payer: Self-pay | Admitting: Bariatrics

## 2021-09-15 LAB — HEMOGLOBIN A1C
Est. average glucose Bld gHb Est-mCnc: 111 mg/dL
Hgb A1c MFr Bld: 5.5 % (ref 4.8–5.6)

## 2021-09-15 LAB — LIPID PANEL WITH LDL/HDL RATIO
Cholesterol, Total: 179 mg/dL (ref 100–199)
HDL: 46 mg/dL (ref >39)
LDL Chol Calc (NIH): 119 mg/dL — ABNORMAL HIGH (ref 0–99)
LDL/HDL Ratio: 2.6 ratio (ref 0.0–3.2)
Triglycerides: 77 mg/dL (ref 0–149)
VLDL Cholesterol Cal: 14 mg/dL (ref 5–40)

## 2021-09-15 LAB — INSULIN, RANDOM: INSULIN: 17.5 u[IU]/mL (ref 2.6–24.9)

## 2021-09-15 LAB — VITAMIN D 25 HYDROXY (VIT D DEFICIENCY, FRACTURES): Vit D, 25-Hydroxy: 27.3 ng/mL — ABNORMAL LOW (ref 30.0–100.0)

## 2021-09-15 LAB — TSH+T4F+T3FREE
Free T4: 0.88 ng/dL (ref 0.82–1.77)
T3, Free: 3.7 pg/mL (ref 2.0–4.4)
TSH: 1.12 u[IU]/mL (ref 0.450–4.500)

## 2021-09-15 NOTE — Progress Notes (Signed)
Chief Complaint:   OBESITY Gail Erickson (MR# [id number]) is a 46 y.o. female who presents for evaluation and treatment of obesity and related comorbidities. Current BMI is Body mass index is 36.64 kg/m. Gail Erickson has been struggling with her weight for many years and has been unsuccessful in either losing weight, maintaining weight loss, or reaching her healthy weight goal.  Gail Erickson states that she does not really like to cook.  She craves sweet and salty snacks.  She has been on the go and notes increased stress level.  Gail Erickson is currently in the action stage of change and ready to dedicate time achieving and maintaining a healthier weight. Gail Erickson is interested in becoming our patient and working on intensive lifestyle modifications including (but not limited to) diet and exercise for weight loss.  Gail Erickson's habits were reviewed today and are as follows: Her family eats meals together, she thinks her family will eat healthier with her, her desired weight loss is 41 lbs, she started gaining weight in college, her heaviest weight ever was 245 pounds, she has significant food cravings issues, she snacks frequently in the evenings, she skips meals frequently, she is frequently drinking liquids with calories, she frequently makes poor food choices, and she struggles with emotional eating.  Depression Screen Gail Erickson's Food and Mood (modified PHQ-9) score was 8.     09/14/2021    8:35 AM  Depression screen PHQ 2/9  Decreased Interest 1  Down, Depressed, Hopeless 1  PHQ - 2 Score 2  Altered sleeping 0  Tired, decreased energy 2  Change in appetite 1  Feeling bad or failure about yourself  1  Trouble concentrating 1  Moving slowly or fidgety/restless 1  Suicidal thoughts 0  PHQ-9 Score 8  Difficult doing work/chores Not difficult at all   Subjective:   1. Other fatigue Gail Erickson admits to daytime somnolence and admits to waking up still tired. Patient has a history of symptoms of daytime fatigue,  morning fatigue, and morning headache. Gail Erickson generally gets 6 hours of sleep per night, and states that she has nightime awakenings and generally restful sleep. Snoring is present. Apneic episodes are not present. Epworth Sleepiness Score is 4.   2. SOB (shortness of breath) on exertion Gail Erickson notes increasing shortness of breath with exercising and seems to be worsening over time with weight gain. She notes getting out of breath sooner with activity than she used to. This has not gotten worse recently. Gail Erickson denies shortness of breath at rest or orthopnea.  Her RMR is lower than expected.  Actual RMR 1699, expected RMR 1897.  3. Other hyperlipidemia Gail Erickson is not on medications currently.  She has a family history of high cholesterol with her mother.  4. Prediabetes Gail Erickson was diagnosed with prediabetes 4-5 years ago.  5. Vitamin D deficiency Gail Erickson is currently taking vitamin D.  6. Health care maintenance Gail Erickson is due to have labs and EKG. She has a history of low vitamin D and notes fatigue.  7. Other disorder of eating Gail Erickson notes some emotional eating.  Assessment/Plan:   1. Other fatigue Gail Erickson does feel that her weight is causing her energy to be lower than it should be. Fatigue may be related to obesity, depression or many other causes. Labs will be ordered, and in the meanwhile, Gail Erickson will focus on self care including making healthy food choices, increasing physical activity and focusing on stress reduction.  - EKG 12-Lead - TSH+T4F+T3Free  2. SOB (shortness of breath)  on exertion Gail Erickson does feel that she gets out of breath more easily that she used to when she exercises. Gail Erickson's shortness of breath appears to be obesity related and exercise induced. She has agreed to work on weight loss and gradually increase exercise to treat her exercise induced shortness of breath. Will continue to monitor closely.  3. Other hyperlipidemia We will check labs today.  Gail Erickson will work on her  meal plan and exercise.  We will follow-up at her next visit.  - Lipid Panel With LDL/HDL Ratio  4. Prediabetes We will check labs today.  Gail Erickson is to work on decreasing carbohydrates and sweets.  We will follow-up at her next visit.  - Insulin, random - Hemoglobin A1c  5. Vitamin D deficiency We will check labs today, and Gail Erickson will continue her vitamin D.  We will follow-up at her next visit.  - VITAMIN D 25 Hydroxy (Vit-D Deficiency, Fractures)  6. Health care maintenance We will check labs today, and EKG was done.   7. Other disorder of eating Patient was referred to Dr. Mallie Erickson, our Bariatric Psychologist, for evaluation due to her elevated PHQ-9 score and significant struggles with emotional eating.  8. Depression screening Gail Erickson had a positive depression screening. Depression is commonly associated with obesity and often results in emotional eating behaviors. We will monitor this closely and work on CBT to help improve the non-hunger eating patterns. Referral to Psychology may be required if no improvement is seen as she continues in our clinic.  9. Class 2 severe obesity with serious comorbidity and body mass index (BMI) of 36.0 to 36.9 in adult, unspecified obesity type (HCC) Gail Erickson is currently in the action stage of change and her goal is to continue with weight loss efforts. I recommend Gail Erickson begin the structured treatment plan as follows:  She has agreed to the Category 2 Plan.  Meal planning and intentional eating were discussed.  Reviewed labs with the patient from 09/06/2021, CMP, CBC, anemia, and glucose.  No sugary drinks.  Dining out guide was given.  Exercise goals: No exercise has been prescribed at this time.   Behavioral modification strategies: increasing lean protein intake, decreasing simple carbohydrates, increasing vegetables, increasing water intake, decreasing eating out, no skipping meals, meal planning and cooking strategies, keeping healthy foods in the  home, and planning for success.  She was informed of the importance of frequent follow-up visits to maximize her success with intensive lifestyle modifications for her multiple health conditions. She was informed we would discuss her lab results at her next visit unless there is a critical issue that needs to be addressed sooner. Gail Erickson agreed to keep her next visit at the agreed upon time to discuss these results.  Objective:   Blood pressure 113/71, pulse 63, temperature 98.3 F (36.8 C), height '5\' 8"'$  (1.727 m), weight 241 lb (109.3 kg), last menstrual period 08/31/2021, SpO2 97 %. Body mass index is 36.64 kg/m.  EKG: Normal sinus rhythm, rate 56 BPM (bradycardia).  Indirect Calorimeter completed today shows a VO2 of 247 and a REE of 1699.  Her calculated basal metabolic rate is 5329 thus her basal metabolic rate is worse than expected.  General: Cooperative, alert, well developed, in no acute distress. HEENT: Conjunctivae and lids unremarkable. Cardiovascular: Regular rhythm.  Lungs: Normal work of breathing. Neurologic: No focal deficits.   Lab Results  Component Value Date   CREATININE 1.06 (H) 09/06/2021   BUN 11 09/06/2021   NA 142 09/06/2021   K  3.8 09/06/2021   CL 112 (H) 09/06/2021   CO2 26 09/06/2021   Lab Results  Component Value Date   ALT 18 09/06/2021   AST 23 09/06/2021   ALKPHOS 34 (L) 09/06/2021   BILITOT 0.6 09/06/2021   Lab Results  Component Value Date   HGBA1C 5.5 09/14/2021   HGBA1C 5.4 06/13/2017   HGBA1C 5.1 07/01/2014   Lab Results  Component Value Date   INSULIN 17.5 09/14/2021   Lab Results  Component Value Date   TSH 1.120 09/14/2021   Lab Results  Component Value Date   CHOL 179 09/14/2021   HDL 46 09/14/2021   LDLCALC 119 (H) 09/14/2021   TRIG 77 09/14/2021   CHOLHDL 3.8 07/28/2021   Lab Results  Component Value Date   WBC 3.8 (L) 09/06/2021   HGB 10.8 (L) 09/06/2021   HCT 33.1 (L) 09/06/2021   MCV 86.3 09/06/2021   PLT 230  09/06/2021   Lab Results  Component Value Date   IRON 67 09/06/2021   TIBC 309 09/06/2021   FERRITIN 77 09/06/2021   Attestation Statements:   Reviewed by clinician on day of visit: allergies, medications, problem list, medical history, surgical history, family history, social history, and previous encounter notes.   Wilhemena Durie, am acting as Location manager for [employer], DO.  I have reviewed the above documentation for accuracy and completeness, and I agree with the above. Jearld Lesch, DO

## 2021-09-19 NOTE — Progress Notes (Signed)
Office: [phone]  /  Fax: [phone]    Date: October 03, 2021   Appointment Start Time: 11:03am Duration: 42 minutes Provider: Glennie Isle, Psy.D. Type of Session: Intake for Individual Therapy  Location of Patient: Home (private location) Location of Provider: Provider's home (private office) Type of Contact: Telepsychological Visit via MyChart Video Visit  Informed Consent: Prior to proceeding with today's appointment, two pieces of identifying information were obtained. In addition, Dante's physical location at the time of this appointment was obtained as well a phone number she could be reached at in the event of technical difficulties. Linzie and this provider participated in today's telepsychological service.   The provider's role was explained to Beaulah Dinning. The provider reviewed and discussed issues of confidentiality, privacy, and limits therein (e.g., reporting obligations). In addition to verbal informed consent, written informed consent for psychological services was obtained prior to the initial appointment. Since the clinic is not a 24/7 crisis center, mental health emergency resources were shared and this  provider explained MyChart, e-mail, voicemail, and/or other messaging systems should be utilized only for non-emergency reasons. This provider also explained that information obtained during appointments will be placed in Odelia's medical record and relevant information will be shared with other providers at [facility] for coordination of care. Elizabelle agreed information may be shared with other [facility] providers as needed for coordination of care and by signing the service agreement document, she provided written consent for coordination of care. Prior to initiating telepsychological services, Chessie completed an informed consent document, which included the development of a safety plan (i.e., an emergency contact and emergency resources) in the  event of an emergency/crisis. Emilie verbally acknowledged understanding she is ultimately responsible for understanding her insurance benefits for telepsychological and in-person services. This provider also reviewed confidentiality, as it relates to telepsychological services. Jonnelle  acknowledged understanding that appointments cannot be recorded without both party consent and she is aware she is responsible for securing confidentiality on her end of the session. Davena verbally consented to proceed.  Chief Complaint/HPI: Aeriel was referred by Dr. Jearld Lesch due to other disorder of eating. Per the note for the initial visit with Dr. Jearld Lesch on 09/14/2021, "Deniese notes some emotional eating." The note for the initial appointment further indicated the following: "Alizeh's habits were reviewed today and are as follows: Her family eats meals together, she thinks her family will eat healthier with her, her desired weight loss is 41 lbs, she started gaining weight in college, her heaviest weight ever was 245 pounds, she has significant food cravings issues, she snacks frequently in the evenings, she skips meals frequently, she is frequently drinking liquids with calories, she frequently makes poor food choices, and she struggles with emotional eating." Aleynah's Food and Mood (modified PHQ-9) score on 09/14/2021 was 8.  During today's appointment, Hazel reported engagement in emotional eating behaviors when feeling stressed, noting, "I'm just grabbing anything that is around." She was verbally administered a questionnaire assessing various behaviors related to emotional eating behaviors. Meckenzie endorsed the following: overeat when you are celebrating, eat certain foods when you are anxious, stressed, depressed, or your feelings are hurt, use food to help you cope with emotional situations, find food is comforting to you, overeat when you are worried about something, and eat as a reward. Jacalynn believes the onset of  emotional eating behaviors was likely in college and described the current frequency of emotional eating behaviors as "few times a week,"  adding it is situational. In addition, Rayyan denied a history of binge eating behaviors. Nakoma denied a history of significantly restricting food intake, purging and engagement in other compensatory strategies, and has never been diagnosed with an eating disorder. She also denied a history of treatment for emotional eating. Furthermore, Dorlene denied other problems of concern. However, she disclosed a history of cancer, adding she is "going to talk to a Psychologist, sport and exercise tomorrow."  Mental Status Examination:  Appearance: neat Behavior: appropriate to circumstances Mood: neutral Affect: mood congruent Speech: WNL Eye Contact: appropriate Psychomotor Activity: WNL Gait: unable to assess  Thought Process: linear, logical, and goal directed and denies suicidal, homicidal, and self-harm ideation, plan and intent  Thought Content/Perception: no hallucinations, delusions, bizarre thinking or behavior endorsed or observed Orientation: AAOx4 Memory/Concentration: memory, attention, language, and fund of knowledge intact  Insight/Judgment: fair  Family & Psychosocial History: Austen reported she is not in a relationship and she does not have any children. She indicated she is currently not working. Additionally, Vaniyah shared her highest level of education obtained is a master's degree. Currently, Zamia's social support system consists of her mother, brother, little cousin, best friend, and few other friends. Moreover, Kylie stated she resides alone. She indicated she and her brother share the care taking responsibility for their mother.  Medical History:  Past Medical History:  Diagnosis Date   Abnormal Pap smear of cervix 06/02/2014   LGSIL:neg HR HPV (1st abn. pap)   Back pain    Cancer (HCC)    Constipation    Cyst of right breast    --stable in 2015 U/S done in [place],  [place]   Elevated DHEA 05/21/2015   level = 343   Family history of breast cancer    High cholesterol    HSV-1 (herpes simplex virus 1) infection    oral HSV   Joint pain    Migraine without aura    PCOS (polycystic ovarian syndrome)    Pre-diabetes    Prediabetes 2021   sees Dr.Balan   Swelling of lower extremity    Vitamin D deficiency 05/21/2015   level = 10   Past Surgical History:  Procedure Laterality Date   BREAST RECONSTRUCTION WITH PLACEMENT OF TISSUE EXPANDER AND FLEX HD (ACELLULAR HYDRATED DERMIS) Right 04/28/2020   Procedure: IMMEDIATE RIGHT BREAST RECONSTRUCTION WITH PLACEMENT OF TISSUE EXPANDER AND FLEX HD (ACELLULAR HYDRATED DERMIS);  Surgeon: Wallace Going, DO;  Location: [facility];  Service: Plastics;  Laterality: Right;   COLONOSCOPY WITH PROPOFOL N/A 06/07/2021   Procedure: COLONOSCOPY WITH PROPOFOL;  Surgeon: Eloise Harman, DO;  [facility];  Service: Endoscopy;  Laterality: N/A;  10:30 / ASA 2   COLPOSCOPY     LIPOSUCTION WITH LIPOFILLING Right 02/17/2021   Procedure: Lipofilling of right breast for symmetry;  Surgeon: Wallace Going, DO;  Location: [facility];  Service: Plastics;  Laterality: Right;  1.5 hour   MASTECTOMY W/ SENTINEL NODE BIOPSY Right 04/28/2020   Procedure: RIGHT MASTECTOMY WITH SENTINEL LYMPH NODE BIOPSY;  Surgeon: [name], MD;  Location: [facility];  Service: General;  Laterality: Right;  PECTORAL BLOCK   MASTOPEXY Left 09/23/2020   Procedure: LEFT BREAST MASTOPEXY;  Surgeon: Wallace Going, DO;  Location: [facility];  Service: Plastics;  Laterality: Left;   POLYPECTOMY  06/07/2021   Procedure: POLYPECTOMY;  Surgeon: Eloise Harman, DO;  [facility];  Service: Endoscopy;;   REMOVAL  OF TISSUE EXPANDER AND PLACEMENT OF IMPLANT Right 09/23/2020   Procedure: REMOVAL OF TISSUE EXPANDER AND PLACEMENT OF IMPLANT;  Surgeon: Wallace Going,  DO;  Location: [facility];  Service: Plastics;  Laterality: Right;  90 min   UMBILICAL HERNIA REPAIR     --as a child   Current Outpatient Medications on File Prior to Visit  Medication Sig Dispense Refill   Ascorbic Acid (VITAMIN C) 1000 MG tablet Take 1,000 mg by mouth every 14 (fourteen) days.     Aspirin-Acetaminophen-Caffeine (EXCEDRIN MIGRAINE PO) Take 1 tablet by mouth daily as needed (headache).     Cetirizine HCl (ZYRTEC PO) Take by mouth.     Cholecalciferol (VITAMIN D3) 25 MCG (1000 UT) CAPS Take 1 capsule (1,000 Units total) by mouth daily. 60 capsule 3   famciclovir (FAMVIR) 500 MG tablet Take '1500mg'$  singe dose as needed for cold sore 3 tablet 3   fluconazole (DIFLUCAN) 150 MG tablet Take one tablet by mouth now and repeat in 72 hours. 2 tablet 0   metroNIDAZOLE (FLAGYL) 500 MG tablet Take 1 tablet (500 mg total) by mouth 2 (two) times daily. 14 tablet 0   Multiple Vitamin (MULTIVITAMIN) tablet Take 1 tablet by mouth every 14 (fourteen) days.     Omega-3 Fatty Acids (FISH OIL) 1000 MG CAPS Take by mouth every 14 (fourteen) days.     Probiotic Product (PROBIOTIC-10 PO) Take 1 tablet by mouth every 14 (fourteen) days.     tamoxifen (NOLVADEX) 20 MG tablet TAKE 1 TABLET(20 MG) BY MOUTH DAILY 30 tablet 3   Vitamin D, Ergocalciferol, (DRISDOL) 1.25 MG (50000 UNIT) CAPS capsule Take 1 capsule (50,000 Units total) by mouth every 7 (seven) days. 5 capsule 0   No current facility-administered medications on file prior to visit.  Medication compliant.   Mental Health History: Niharika reported she first attended therapeutic services around age 40, noting it was due to ongoing stressors and family conflict. She indicated she switched providers in December 2022 as her therapist was retiring, noting she continues to meet with the new therapist (Hailey Lathom with [employer]). She stated they meet weekly to address ongoing stressors. She stated she is unaware  of any diagnoses. Makena agreed to sign an authorization for coordination of care if deemed necessary.  She denied a history of psychotropic medications. Telissa reported there is no history of hospitalizations for psychiatric concerns. Sherronda endorsed a family history of  substance abuse (maternal uncle and maternal cousins) but denied mental health related concerns. Furthermore, Shatavia reported there is no history of trauma including psychological, physical , and sexual abuse, as well as neglect.   Jalyne described her typical mood lately as "not as upbeat" due to decreased energy in the past 1.5 weeks. She reflected she was on a trip in the past week, adding the bus ride was "physically taxing." Florie also reported she had multiple interviews recently and described them as an "emotional roller coaster." She reported experiencing some stress secondary to seeking employment and an upcoming surgery. Additionally, she stated she has experienced some challenges with focus since she has not been working. Evalena reported consuming alcohol approximately every month in the form of one standard drink. She denied tobacco use. She denied illicit/recreational substance use. Furthermore, Hermine indicated she is not experiencing the following: hallucinations and delusions, paranoia, symptoms of mania , social withdrawal, crying spells, panic attacks, memory concerns, attention and concentration issues, and obsessions and compulsions. She also denied history  of and current suicidal ideation, plan, and intent; history of and current homicidal ideation, plan, and intent; and history of and current engagement in self-harm.  The following strengths were reported by [name]: leader; problem solving abilities; good communicator, good at conflict resolution, and caring. The following strengths were observed by this provider: ability to express thoughts and feelings during the therapeutic session, ability to establish and benefit from a  therapeutic relationship, willingness to work toward established goal(s) with the clinic and ability to engage in reciprocal conversation.   Legal History: Ivery reported there is no history of legal involvement.   Structured Assessments Results: The Patient Health Questionnaire-9 (PHQ-9) is a self-report measure that assesses symptoms and severity of depression over the course of the last two weeks. Georgette obtained a score of 1 suggesting minimal depression. Pamelia finds the endorsed symptoms to be not difficult at all. [0= Not at all; 1= Several days; 2= More than half the days; 3= Nearly every day] Little interest or pleasure in doing things 0  Feeling down, depressed, or hopeless 0  Trouble falling or staying asleep, or sleeping too much 0  Feeling tired or having little energy 1  Poor appetite or overeating 0  Feeling bad about yourself --- or that you are a failure or have let yourself or your family down 0  Trouble concentrating on things, such as reading the newspaper or watching television 0  Moving or speaking so slowly that other people could have noticed? Or the opposite --- being so fidgety or restless that you have been moving around a lot more than usual 0  Thoughts that you would be better off dead or hurting yourself in some way 0  PHQ-9 Score 1    The Generalized Anxiety Disorder-7 (GAD-7) is a brief self-report measure that assesses symptoms of anxiety over the course of the last two weeks. Callen obtained a score of 0. [0= Not at all; 1= Several days; 2= Over half the days; 3= Nearly every day] Feeling nervous, anxious, on edge 0  Not being able to stop or control worrying 0  Worrying too much about different things  0  Trouble relaxing 0  Being so restless that it's hard to sit still 0  Becoming easily annoyed or irritable 0  Feeling afraid as if something awful might happen 0  GAD-7 Score 0   Interventions:  Conducted a chart review Focused on rapport  building Verbally administered PHQ-9 and GAD-7 for symptom monitoring Verbally administered Food & Mood questionnaire to assess various behaviors related to emotional eating Provided emphatic reflections and validation Collaborated with patient on a treatment goal  Psychoeducation provided regarding physical versus emotional hunger  Diagnostic Impressions & Provisional DSM-5 Diagnosis(es): Latalia reported a history of engagement in emotional eating behaviors starting in college, and noted the current frequency as "few times a week" secondary to stress. She denied engagement in any other disordered eating behaviors. As such, the following diagnosis was assigned: F50.89 Other Specified Feeding or Eating Disorder, Emotional Eating Behaviors.  Plan: Alona appears able and willing to participate as evidenced by collaboration on a treatment goal, engagement in reciprocal conversation, and asking questions as needed for clarification. The next appointment is scheduled for 10/18/2021 at 11:00am, which will be via MyChart Video Visit. The following treatment goal was established: increase coping skills. This provider will regularly review the treatment plan and medical chart to keep informed of status changes. Ronasia expressed understanding and agreement with the initial treatment plan of  care. Addylin will be sent a handout via e-mail to utilize between now and the next appointment to increase awareness of hunger patterns and subsequent eating. Janesha provided verbal consent during today's appointment for this provider to send the handout via e-mail. Moreover, Jocelin will continue meeting with her primary therapist on a weekly basis.

## 2021-09-22 ENCOUNTER — Other Ambulatory Visit: Payer: BC Managed Care – PPO

## 2021-09-22 ENCOUNTER — Ambulatory Visit: Payer: BC Managed Care – PPO | Admitting: Nurse Practitioner

## 2021-09-27 ENCOUNTER — Ambulatory Visit: Payer: BC Managed Care – PPO | Admitting: Nurse Practitioner

## 2021-09-27 ENCOUNTER — Encounter: Payer: Self-pay | Admitting: Nurse Practitioner

## 2021-09-27 VITALS — BP 114/70 | HR 96 | Ht 69.0 in | Wt 244.0 lb

## 2021-09-27 DIAGNOSIS — B351 Tinea unguium: Secondary | ICD-10-CM | POA: Insufficient documentation

## 2021-09-27 DIAGNOSIS — E559 Vitamin D deficiency, unspecified: Secondary | ICD-10-CM

## 2021-09-27 DIAGNOSIS — Z6836 Body mass index (BMI) 36.0-36.9, adult: Secondary | ICD-10-CM

## 2021-09-27 DIAGNOSIS — B001 Herpesviral vesicular dermatitis: Secondary | ICD-10-CM | POA: Insufficient documentation

## 2021-09-27 DIAGNOSIS — E782 Mixed hyperlipidemia: Secondary | ICD-10-CM | POA: Diagnosis not present

## 2021-09-27 DIAGNOSIS — E661 Drug-induced obesity: Secondary | ICD-10-CM

## 2021-09-27 MED ORDER — FAMCICLOVIR 500 MG PO TABS: ORAL_TABLET | ORAL | 3 refills | Status: DC

## 2021-09-27 MED ORDER — VITAMIN D3 25 MCG (1000 UT) PO CAPS: 1000.0000 [IU] | ORAL_CAPSULE | Freq: Every day | ORAL | 3 refills | Status: AC

## 2021-09-27 NOTE — Assessment & Plan Note (Signed)
Chronic condition Affecting all toenails on right foot Patient referred to podiatrist

## 2021-09-27 NOTE — Assessment & Plan Note (Signed)
Take famiclovir '1500mg'$  one time dose whenever she has outbreaks

## 2021-09-27 NOTE — Progress Notes (Addendum)
   Gail Erickson     MRN: [id number]      DOB: Sep 24, 1975   HPI [name] with past medical history of malignant neoplasm of right breast, vitamin D deficiency, hyperlipidemia class II severe obesity is here for follow up and re-evaluation of chronic medical conditions, medication management and review of any available recent lab       Patient stated that she had fever blisters on her lips last week, initially had a stinging sensation on her lips before outbreak of blisters.  She currently does not have blisters  but would like medication prescribed in case she a reoccurrence.   Stated that she had 3 flareups in the past 12 months   Patient complains of fungal toe nail infection affecting all toes on her right foot  She stated that she has seen a podiatrist in the past she was prescribed a medication which she did not take due to concerns with the medication affecting her liver.  She reports intermittent pain on her right foot toes.    ROS Denies recent fever or chills. Denies sinus pressure, nasal congestion, ear pain or sore throat. Denies chest congestion, productive cough or wheezing. Denies chest pains, palpitations and leg swelling Denies abdominal pain, nausea, vomiting,diarrhea or constipation.   Denies dysuria, frequency, hesitancy or incontinence. Denies joint pain, swelling and limitation in mobility. Denies headaches, seizures, numbness, or tingling. Denies depression, anxiety or insomnia.   PE  BP 114/70   Pulse 96   Ht '5\' 9"'$  (1.753 m)   Wt 244 lb (110.7 kg)   LMP 08/31/2021 (Exact Date)   SpO2 96%   BMI 36.03 kg/m   Patient alert and oriented and in no cardiopulmonary distress.   Chest: Clear to auscultation bilaterally.  CVS: S1, S2 no murmurs, no S3.Regular rate.  ABD: Soft non tender.   Ext: No edema  MS: Adequate ROM spine, shoulders, hips and knees.  Skin.  Toe nails on right foot appear thickened, nail polish in place   Psych: Good eye contact,  normal affect. Memory intact not anxious or depressed appearing.  CNS: CN 2-12 intact, power,  normal throughout.no focal deficits noted.   Assessment & Plan Fever blister Take famiclovir '1500mg'$  one time dose whenever she has outbreaks   Vitamin D deficiency Patient told to start taking vitamin D 1000 units daily, currently taking vitamin D 1000 units every 14 days Last vitamin D Lab Results  Component Value Date   VD25OH 27.3 (L) 09/14/2021    Class 2 severe obesity with serious comorbidity and body mass index (BMI) of 36.0 to 36.9 in adult ([place]) Wt Readings from Last 3 Encounters:  09/27/21 244 lb (110.7 kg)  09/14/21 241 lb (109.3 kg)  09/06/21 243 lb 11.2 oz (110.5 kg)  Now follwing diet plan and following up with weight management clinic Patient encouraged to maintain close follow-up with the weight management clinic Counseled on low-carb diet engage in regular moderate exercises at least 150 minutes weekly  Fungal infection of toenail Chronic condition Affecting all toenails on right foot Patient referred to podiatrist  HLD (hyperlipidemia) Lab Results  Component Value Date   CHOL 179 09/14/2021   HDL 46 09/14/2021   LDLCALC 119 (H) 09/14/2021   TRIG 77 09/14/2021   CHOLHDL 3.8 07/28/2021  Avoid fried fatty foods

## 2021-09-27 NOTE — Assessment & Plan Note (Signed)
Patient told to start taking vitamin D 1000 units daily, currently taking vitamin D 1000 units every 14 days Last vitamin D Lab Results  Component Value Date   VD25OH 27.3 (L) 09/14/2021

## 2021-09-27 NOTE — Assessment & Plan Note (Signed)
Wt Readings from Last 3 Encounters:  09/27/21 244 lb (110.7 kg)  09/14/21 241 lb (109.3 kg)  09/06/21 243 lb 11.2 oz (110.5 kg)  Now follwing diet plan and following up with weight management clinic Patient encouraged to maintain close follow-up with the weight management clinic Counseled on low-carb diet engage in regular moderate exercises at least 150 minutes weekly

## 2021-09-27 NOTE — Assessment & Plan Note (Signed)
Lab Results  Component Value Date   CHOL 179 09/14/2021   HDL 46 09/14/2021   LDLCALC 119 (H) 09/14/2021   TRIG 77 09/14/2021   CHOLHDL 3.8 07/28/2021  Avoid fried fatty foods

## 2021-09-27 NOTE — Patient Instructions (Signed)

## 2021-09-28 ENCOUNTER — Encounter (INDEPENDENT_AMBULATORY_CARE_PROVIDER_SITE_OTHER): Payer: Self-pay | Admitting: Bariatrics

## 2021-09-28 ENCOUNTER — Ambulatory Visit (INDEPENDENT_AMBULATORY_CARE_PROVIDER_SITE_OTHER): Payer: BC Managed Care – PPO | Admitting: Bariatrics

## 2021-09-28 VITALS — BP 115/70 | HR 65 | Temp 98.1°F | Ht 68.0 in | Wt 239.0 lb

## 2021-09-28 DIAGNOSIS — E559 Vitamin D deficiency, unspecified: Secondary | ICD-10-CM | POA: Diagnosis not present

## 2021-09-28 DIAGNOSIS — E669 Obesity, unspecified: Secondary | ICD-10-CM | POA: Diagnosis not present

## 2021-09-28 DIAGNOSIS — E8881 Metabolic syndrome: Secondary | ICD-10-CM

## 2021-09-28 DIAGNOSIS — F5089 Other specified eating disorder: Secondary | ICD-10-CM | POA: Diagnosis not present

## 2021-09-28 DIAGNOSIS — Z6836 Body mass index (BMI) 36.0-36.9, adult: Secondary | ICD-10-CM

## 2021-09-28 DIAGNOSIS — F4323 Adjustment disorder with mixed anxiety and depressed mood: Secondary | ICD-10-CM | POA: Diagnosis not present

## 2021-09-28 MED ORDER — VITAMIN D (ERGOCALCIFEROL) 1.25 MG (50000 UNIT) PO CAPS: 50000.0000 [IU] | ORAL_CAPSULE | ORAL | 0 refills | Status: DC

## 2021-10-03 ENCOUNTER — Telehealth (INDEPENDENT_AMBULATORY_CARE_PROVIDER_SITE_OTHER): Payer: BC Managed Care – PPO | Admitting: Psychology

## 2021-10-03 DIAGNOSIS — F5089 Other specified eating disorder: Secondary | ICD-10-CM

## 2021-10-04 ENCOUNTER — Ambulatory Visit: Payer: Self-pay | Admitting: General Surgery

## 2021-10-04 DIAGNOSIS — N6323 Unspecified lump in the left breast, lower outer quadrant: Secondary | ICD-10-CM | POA: Diagnosis not present

## 2021-10-04 DIAGNOSIS — Z170 Estrogen receptor positive status [ER+]: Secondary | ICD-10-CM | POA: Diagnosis not present

## 2021-10-04 DIAGNOSIS — C50811 Malignant neoplasm of overlapping sites of right female breast: Secondary | ICD-10-CM | POA: Diagnosis not present

## 2021-10-04 NOTE — Progress Notes (Signed)
  Office: [phone]  /  Fax: [phone]    Date: 10/18/2021   Appointment Start Time: 11:03am Duration: 24 minutes Provider: Glennie Isle, Psy.D. Type of Session: Individual Therapy  Location of Patient: Parked in car (safe/private location in [place]) Location of Provider: Provider's Home (private office) Type of Contact: Telepsychological Visit via MyChart Video Visit  Session Content: Gail Erickson is a 45 y.o. female presenting for a follow-up appointment to address the previously established treatment goal of increasing coping skills.Today's appointment was a telepsychological visit. Gail Erickson provided verbal consent for today's telepsychological appointment and she is aware she is responsible for securing confidentiality on her end of the session. Prior to proceeding with today's appointment, Gail Erickson's physical location at the time of this appointment was obtained as well a phone number she could be reached at in the event of technical difficulties. Gail Erickson and this provider participated in today's telepsychological service.   This provider conducted a brief check-in. Gail Erickson shared about recent and upcoming events, including surgery on Friday. She stated she is eating more on the go, adding it is not necessarily overeating. Gail Erickson further shared that when her routine is altered, her eating habits are impacted. Thus, she was engaged in problem solving to help her make better choices and engage in portion control when her routine is altered (e.g., using the eating out handout shared by Dr. Owens Shark; taking snacks congruent to the structured meal plan, especially protein options; packing a sandwich). Overall, Gail Erickson was receptive to today's appointment as evidenced by openness to sharing, responsiveness to feedback, and willingness to implement discussed strategies .  Mental Status Examination:  Appearance: neat Behavior: appropriate to circumstances Mood: neutral Affect: mood congruent Speech: WNL Eye  Contact: appropriate Psychomotor Activity: WNL Gait: unable to assess Thought Process: linear, logical, and goal directed and no evidence or endorsement of suicidal, homicidal, and self-harm ideation, plan and intent  Thought Content/Perception: no hallucinations, delusions, bizarre thinking or behavior endorsed or observed Orientation: AAOx4 Memory/Concentration: memory, attention, language, and fund of knowledge intact  Insight: fair Judgment: fair  Interventions:  Conducted a brief chart review Provided empathic reflections and validation Employed supportive psychotherapy interventions to facilitate reduced distress and to improve coping skills with identified stressors Engaged patient in problem solving  DSM-5 Diagnosis(es): F50.89 Other Specified Feeding or Eating Disorder, Emotional Eating Behaviors  Treatment Goal & Progress: During the initial appointment with this provider, the following treatment goal was established: increase coping skills. Progress is limited, as Gail Erickson has just begun treatment with this provider; however, she is receptive to the interaction and interventions and rapport is being established.   Plan: The next appointment is scheduled for 11/01/2021 at 2pm, which will be via [place] Visit. The next session will focus on working towards the established treatment goal. Gail Erickson will continue meeting with her primary therapist.

## 2021-10-04 NOTE — Progress Notes (Signed)
Chief Complaint:   OBESITY Rosie is here to discuss her progress with her obesity treatment plan along with follow-up of her obesity related diagnoses. Yina is on the Category 2 Plan and states she is following her eating plan approximately 50% of the time. Carolene states she is walking for 60 minutes 6 times per week.  Today's visit was #: 2 Starting weight: 241 lbs Starting date: 09/14/2021 Today's weight: 239 lbs Today's date: 09/28/2021 Total lbs lost to date: 2 Total lbs lost since last in-office visit: 2  Interim History: Machele is drinking less soda and has increased her water intake.  Subjective:   1. Vitamin D deficiency Kynlee's vitamin D level is 27.3.  2. Insulin resistance Milee's insulin level is 17.5, her ideal range is below 10.  3. Other disorder of eating Rudy is to begin with Dr. Mallie Mussel, our bariatric psychologist for emotional eating.  Assessment/Plan:   1. Vitamin D deficiency Destyni agreed to start prescription vitamin D 50,000 units once weekly, with no refills.  - Vitamin D, Ergocalciferol, (DRISDOL) 1.25 MG (50000 UNIT) CAPS capsule; Take 1 capsule (50,000 Units total) by mouth every 7 (seven) days.  Dispense: 5 capsule; Refill: 0  2. Insulin resistance Handout on insulin resistance and metformin was given to the patient today.  3. Other disorder of eating Eduardo will continue to follow-up with her visits with Dr. Mallie Mussel.  4. Obesity, Current BMI 36.5 Glenetta is currently in the action stage of change. As such, her goal is to continue with weight loss efforts. She has agreed to the Category 2 Plan.   Madyn will keep her water intake high.  Reviewed labs with the patient from 09/14/2021, lipids and vitamin D.  Smart fruit sheet was given.  Exercise goals: As is.   Behavioral modification strategies: increasing lean protein intake, decreasing simple carbohydrates, increasing vegetables, increasing water intake, decreasing eating out, no skipping  meals, meal planning and cooking strategies, keeping healthy foods in the home, and planning for success.  Angi has agreed to follow-up with our clinic in 2 to 3 weeks. She was informed of the importance of frequent follow-up visits to maximize her success with intensive lifestyle modifications for her multiple health conditions.   Objective:   Blood pressure 115/70, pulse 65, temperature 98.1 F (36.7 C), height '5\' 8"'$  (1.727 m), weight 239 lb (108.4 kg), last menstrual period 08/31/2021, SpO2 99 %. Body mass index is 36.34 kg/m.  General: Cooperative, alert, well developed, in no acute distress. HEENT: Conjunctivae and lids unremarkable. Cardiovascular: Regular rhythm.  Lungs: Normal work of breathing. Neurologic: No focal deficits.   Lab Results  Component Value Date   CREATININE 1.06 (H) 09/06/2021   BUN 11 09/06/2021   NA 142 09/06/2021   K 3.8 09/06/2021   CL 112 (H) 09/06/2021   CO2 26 09/06/2021   Lab Results  Component Value Date   ALT 18 09/06/2021   AST 23 09/06/2021   ALKPHOS 34 (L) 09/06/2021   BILITOT 0.6 09/06/2021   Lab Results  Component Value Date   HGBA1C 5.5 09/14/2021   HGBA1C 5.4 06/13/2017   HGBA1C 5.1 07/01/2014   Lab Results  Component Value Date   INSULIN 17.5 09/14/2021   Lab Results  Component Value Date   TSH 1.120 09/14/2021   Lab Results  Component Value Date   CHOL 179 09/14/2021   HDL 46 09/14/2021   LDLCALC 119 (H) 09/14/2021   TRIG 77 09/14/2021   CHOLHDL 3.8  07/28/2021   Lab Results  Component Value Date   VD25OH 27.3 (L) 09/14/2021   VD25OH 23 (L) 07/28/2021   VD25OH 32.8 06/02/2019   Lab Results  Component Value Date   WBC 3.8 (L) 09/06/2021   HGB 10.8 (L) 09/06/2021   HCT 33.1 (L) 09/06/2021   MCV 86.3 09/06/2021   PLT 230 09/06/2021   Lab Results  Component Value Date   IRON 67 09/06/2021   TIBC 309 09/06/2021   FERRITIN 77 09/06/2021   Attestation Statements:   Reviewed by clinician on day of visit:  allergies, medications, problem list, medical history, surgical history, family history, social history, and previous encounter notes.  Wilhemena Durie, am acting as Location manager for [employer], DO.  I have reviewed the above documentation for accuracy and completeness, and I agree with the above. Jearld Lesch, DO

## 2021-10-05 ENCOUNTER — Encounter (INDEPENDENT_AMBULATORY_CARE_PROVIDER_SITE_OTHER): Payer: Self-pay | Admitting: Bariatrics

## 2021-10-05 DIAGNOSIS — F4323 Adjustment disorder with mixed anxiety and depressed mood: Secondary | ICD-10-CM | POA: Diagnosis not present

## 2021-10-07 ENCOUNTER — Other Ambulatory Visit: Payer: Self-pay | Admitting: General Surgery

## 2021-10-07 DIAGNOSIS — N6323 Unspecified lump in the left breast, lower outer quadrant: Secondary | ICD-10-CM

## 2021-10-12 DIAGNOSIS — F4323 Adjustment disorder with mixed anxiety and depressed mood: Secondary | ICD-10-CM | POA: Diagnosis not present

## 2021-10-13 ENCOUNTER — Ambulatory Visit: Payer: BC Managed Care – PPO | Admitting: Podiatry

## 2021-10-14 ENCOUNTER — Encounter (HOSPITAL_BASED_OUTPATIENT_CLINIC_OR_DEPARTMENT_OTHER): Payer: Self-pay | Admitting: General Surgery

## 2021-10-14 ENCOUNTER — Other Ambulatory Visit: Payer: Self-pay

## 2021-10-18 ENCOUNTER — Ambulatory Visit
Admission: RE | Admit: 2021-10-18 | Discharge: 2021-10-18 | Disposition: A | Discharge status: Home / Self Care | Payer: BC Managed Care – PPO | Source: Ambulatory Visit | Attending: General Surgery | Admitting: General Surgery

## 2021-10-18 ENCOUNTER — Telehealth (INDEPENDENT_AMBULATORY_CARE_PROVIDER_SITE_OTHER): Payer: BC Managed Care – PPO | Admitting: Psychology

## 2021-10-18 DIAGNOSIS — R928 Other abnormal and inconclusive findings on diagnostic imaging of breast: Secondary | ICD-10-CM | POA: Diagnosis not present

## 2021-10-18 DIAGNOSIS — N6323 Unspecified lump in the left breast, lower outer quadrant: Secondary | ICD-10-CM

## 2021-10-18 DIAGNOSIS — F5089 Other specified eating disorder: Secondary | ICD-10-CM | POA: Diagnosis not present

## 2021-10-18 NOTE — Progress Notes (Signed)

## 2021-10-19 ENCOUNTER — Encounter (INDEPENDENT_AMBULATORY_CARE_PROVIDER_SITE_OTHER): Payer: Self-pay

## 2021-10-19 DIAGNOSIS — F4323 Adjustment disorder with mixed anxiety and depressed mood: Secondary | ICD-10-CM | POA: Diagnosis not present

## 2021-10-21 ENCOUNTER — Ambulatory Visit (HOSPITAL_BASED_OUTPATIENT_CLINIC_OR_DEPARTMENT_OTHER)
Admission: RE | Admit: 2021-10-21 | Discharge: 2021-10-21 | Disposition: A | Discharge status: Home / Self Care | Payer: BC Managed Care – PPO | Attending: General Surgery | Admitting: General Surgery

## 2021-10-21 ENCOUNTER — Encounter (HOSPITAL_BASED_OUTPATIENT_CLINIC_OR_DEPARTMENT_OTHER)
Admission: RE | Disposition: A | Discharge status: Home / Self Care | Payer: Self-pay | Source: Home / Self Care | Attending: General Surgery

## 2021-10-21 ENCOUNTER — Ambulatory Visit (HOSPITAL_BASED_OUTPATIENT_CLINIC_OR_DEPARTMENT_OTHER): Payer: BC Managed Care – PPO | Admitting: Anesthesiology

## 2021-10-21 ENCOUNTER — Other Ambulatory Visit: Payer: Self-pay

## 2021-10-21 ENCOUNTER — Encounter (HOSPITAL_BASED_OUTPATIENT_CLINIC_OR_DEPARTMENT_OTHER): Payer: Self-pay | Admitting: General Surgery

## 2021-10-21 ENCOUNTER — Ambulatory Visit
Admission: RE | Admit: 2021-10-21 | Discharge: 2021-10-21 | Disposition: A | Discharge status: Home / Self Care | Payer: BC Managed Care – PPO | Source: Ambulatory Visit | Attending: General Surgery | Admitting: General Surgery

## 2021-10-21 DIAGNOSIS — Z170 Estrogen receptor positive status [ER+]: Secondary | ICD-10-CM | POA: Diagnosis not present

## 2021-10-21 DIAGNOSIS — N6320 Unspecified lump in the left breast, unspecified quadrant: Secondary | ICD-10-CM | POA: Diagnosis not present

## 2021-10-21 DIAGNOSIS — N6012 Diffuse cystic mastopathy of left breast: Secondary | ICD-10-CM | POA: Insufficient documentation

## 2021-10-21 DIAGNOSIS — Z9011 Acquired absence of right breast and nipple: Secondary | ICD-10-CM | POA: Diagnosis not present

## 2021-10-21 DIAGNOSIS — E669 Obesity, unspecified: Secondary | ICD-10-CM | POA: Diagnosis not present

## 2021-10-21 DIAGNOSIS — C50811 Malignant neoplasm of overlapping sites of right female breast: Secondary | ICD-10-CM | POA: Insufficient documentation

## 2021-10-21 DIAGNOSIS — Z79810 Long term (current) use of selective estrogen receptor modulators (SERMs): Secondary | ICD-10-CM | POA: Insufficient documentation

## 2021-10-21 DIAGNOSIS — N6323 Unspecified lump in the left breast, lower outer quadrant: Secondary | ICD-10-CM | POA: Diagnosis not present

## 2021-10-21 DIAGNOSIS — Z01818 Encounter for other preprocedural examination: Secondary | ICD-10-CM

## 2021-10-21 DIAGNOSIS — Z6836 Body mass index (BMI) 36.0-36.9, adult: Secondary | ICD-10-CM | POA: Diagnosis not present

## 2021-10-21 DIAGNOSIS — R928 Other abnormal and inconclusive findings on diagnostic imaging of breast: Secondary | ICD-10-CM | POA: Diagnosis not present

## 2021-10-21 HISTORY — PX: BREAST LUMPECTOMY WITH RADIOACTIVE SEED LOCALIZATION: SHX6424

## 2021-10-21 HISTORY — PX: BREAST EXCISIONAL BIOPSY: SUR124

## 2021-10-21 LAB — POCT PREGNANCY, URINE: Preg Test, Ur: NEGATIVE

## 2021-10-21 SURGERY — BREAST LUMPECTOMY WITH RADIOACTIVE SEED LOCALIZATION
Anesthesia: General | Site: Breast | Laterality: Left

## 2021-10-21 MED ORDER — CEFAZOLIN SODIUM-DEXTROSE 2-4 GM/100ML-% IV SOLN
2.0000 g | INTRAVENOUS | Status: AC
  Administered 2021-10-21: 2 g via INTRAVENOUS

## 2021-10-21 MED ORDER — CHLORHEXIDINE GLUCONATE CLOTH 2 % EX PADS: 6.0000 | MEDICATED_PAD | Freq: Once | CUTANEOUS | Status: DC

## 2021-10-21 MED ORDER — HYDROMORPHONE HCL 1 MG/ML IJ SOLN
INTRAMUSCULAR | Status: AC
  Filled 2021-10-21: qty 0.5

## 2021-10-21 MED ORDER — BUPIVACAINE-EPINEPHRINE (PF) 0.25% -1:200000 IJ SOLN
INTRAMUSCULAR | Status: DC | PRN
  Administered 2021-10-21: 20 mL

## 2021-10-21 MED ORDER — LIDOCAINE 2% (20 MG/ML) 5 ML SYRINGE
INTRAMUSCULAR | Status: AC
Start: 2021-10-21 — End: ?
  Filled 2021-10-21: qty 5

## 2021-10-21 MED ORDER — LACTATED RINGERS IV SOLN: INTRAVENOUS | Status: DC

## 2021-10-21 MED ORDER — FENTANYL CITRATE (PF) 100 MCG/2ML IJ SOLN
INTRAMUSCULAR | Status: DC | PRN
Start: 2021-10-21 — End: 2021-10-21
  Administered 2021-10-21: 50 ug via INTRAVENOUS
  Administered 2021-10-21 (×3): 25 ug via INTRAVENOUS

## 2021-10-21 MED ORDER — MIDAZOLAM HCL 2 MG/2ML IJ SOLN
INTRAMUSCULAR | Status: AC
  Filled 2021-10-21: qty 2

## 2021-10-21 MED ORDER — PHENYLEPHRINE 80 MCG/ML (10ML) SYRINGE FOR IV PUSH (FOR BLOOD PRESSURE SUPPORT)
PREFILLED_SYRINGE | INTRAVENOUS | Status: AC
  Filled 2021-10-21: qty 10

## 2021-10-21 MED ORDER — MEPERIDINE HCL 25 MG/ML IJ SOLN: 6.2500 mg | INTRAMUSCULAR | Status: DC | PRN

## 2021-10-21 MED ORDER — PROMETHAZINE HCL 25 MG/ML IJ SOLN: 6.2500 mg | INTRAMUSCULAR | Status: DC | PRN

## 2021-10-21 MED ORDER — ONDANSETRON HCL 4 MG/2ML IJ SOLN
INTRAMUSCULAR | Status: AC
  Filled 2021-10-21: qty 2

## 2021-10-21 MED ORDER — MIDAZOLAM HCL 5 MG/5ML IJ SOLN
INTRAMUSCULAR | Status: DC | PRN
  Administered 2021-10-21: 2 mg via INTRAVENOUS

## 2021-10-21 MED ORDER — AMISULPRIDE (ANTIEMETIC) 5 MG/2ML IV SOLN: 10.0000 mg | Freq: Once | INTRAVENOUS | Status: DC | PRN

## 2021-10-21 MED ORDER — PROPOFOL 10 MG/ML IV BOLUS
INTRAVENOUS | Status: AC
  Filled 2021-10-21: qty 20

## 2021-10-21 MED ORDER — PROPOFOL 500 MG/50ML IV EMUL
INTRAVENOUS | Status: DC | PRN
  Administered 2021-10-21: 25 ug/kg/min via INTRAVENOUS

## 2021-10-21 MED ORDER — OXYCODONE HCL 5 MG PO TABS
ORAL_TABLET | ORAL | Status: AC
  Filled 2021-10-21: qty 1

## 2021-10-21 MED ORDER — ACETAMINOPHEN 500 MG PO TABS
1000.0000 mg | ORAL_TABLET | ORAL | Status: AC
  Administered 2021-10-21: 1000 mg via ORAL

## 2021-10-21 MED ORDER — FENTANYL CITRATE (PF) 100 MCG/2ML IJ SOLN
INTRAMUSCULAR | Status: AC
  Filled 2021-10-21: qty 2

## 2021-10-21 MED ORDER — OXYCODONE HCL 5 MG PO TABS
5.0000 mg | ORAL_TABLET | Freq: Once | ORAL | Status: AC | PRN
  Administered 2021-10-21: 5 mg via ORAL

## 2021-10-21 MED ORDER — HYDROMORPHONE HCL 1 MG/ML IJ SOLN
0.2500 mg | INTRAMUSCULAR | Status: DC | PRN
  Administered 2021-10-21 (×2): 0.5 mg via INTRAVENOUS

## 2021-10-21 MED ORDER — DEXAMETHASONE SODIUM PHOSPHATE 4 MG/ML IJ SOLN
INTRAMUSCULAR | Status: DC | PRN
  Administered 2021-10-21: 4 mg via INTRAVENOUS

## 2021-10-21 MED ORDER — OXYCODONE HCL 5 MG/5ML PO SOLN: 5.0000 mg | Freq: Once | ORAL | Status: AC | PRN

## 2021-10-21 MED ORDER — PROPOFOL 10 MG/ML IV BOLUS
INTRAVENOUS | Status: DC | PRN
  Administered 2021-10-21: 200 mg via INTRAVENOUS

## 2021-10-21 MED ORDER — LIDOCAINE 2% (20 MG/ML) 5 ML SYRINGE
INTRAMUSCULAR | Status: DC | PRN
  Administered 2021-10-21: 60 mg via INTRAVENOUS

## 2021-10-21 MED ORDER — MIDAZOLAM HCL 5 MG/5ML IJ SOLN: INTRAMUSCULAR | Status: DC | PRN

## 2021-10-21 MED ORDER — CELECOXIB 200 MG PO CAPS
ORAL_CAPSULE | ORAL | Status: AC
  Filled 2021-10-21: qty 1

## 2021-10-21 MED ORDER — 0.9 % SODIUM CHLORIDE (POUR BTL) OPTIME
TOPICAL | Status: DC | PRN
  Administered 2021-10-21: 120 mL

## 2021-10-21 MED ORDER — ONDANSETRON HCL 4 MG/2ML IJ SOLN
INTRAMUSCULAR | Status: DC | PRN
  Administered 2021-10-21: 4 mg via INTRAVENOUS

## 2021-10-21 MED ORDER — GABAPENTIN 300 MG PO CAPS
300.0000 mg | ORAL_CAPSULE | ORAL | Status: AC
  Administered 2021-10-21: 300 mg via ORAL

## 2021-10-21 MED ORDER — DEXAMETHASONE SODIUM PHOSPHATE 10 MG/ML IJ SOLN
INTRAMUSCULAR | Status: AC
  Filled 2021-10-21: qty 1

## 2021-10-21 MED ORDER — GABAPENTIN 300 MG PO CAPS
ORAL_CAPSULE | ORAL | Status: AC
  Filled 2021-10-21: qty 1

## 2021-10-21 MED ORDER — OXYCODONE HCL 5 MG PO TABS: 5.0000 mg | ORAL_TABLET | Freq: Four times a day (QID) | ORAL | 0 refills | Status: DC | PRN

## 2021-10-21 MED ORDER — CELECOXIB 200 MG PO CAPS
200.0000 mg | ORAL_CAPSULE | ORAL | Status: AC
  Administered 2021-10-21: 200 mg via ORAL

## 2021-10-21 MED ORDER — CEFAZOLIN SODIUM-DEXTROSE 2-4 GM/100ML-% IV SOLN
INTRAVENOUS | Status: AC
  Filled 2021-10-21: qty 100

## 2021-10-21 MED ORDER — ACETAMINOPHEN 500 MG PO TABS
ORAL_TABLET | ORAL | Status: AC
  Filled 2021-10-21: qty 2

## 2021-10-21 SURGICAL SUPPLY — 45 items
ADH SKN CLS APL DERMABOND .7 (GAUZE/BANDAGES/DRESSINGS) ×1
APL PRP STRL LF DISP 70% ISPRP (MISCELLANEOUS) ×1
APPLIER CLIP 9.375 MED OPEN (MISCELLANEOUS)
APR CLP MED 9.3 20 MLT OPN (MISCELLANEOUS)
BLADE SURG 15 STRL LF DISP TIS (BLADE) ×1 IMPLANT
BLADE SURG 15 STRL SS (BLADE) ×2
CANISTER SUC SOCK COL 7IN (MISCELLANEOUS) ×2 IMPLANT
CANISTER SUCT 1200ML W/VALVE (MISCELLANEOUS) ×2 IMPLANT
CHLORAPREP W/TINT 26 (MISCELLANEOUS) ×2 IMPLANT
CLIP APPLIE 9.375 MED OPEN (MISCELLANEOUS) IMPLANT
COVER BACK TABLE 60X90IN (DRAPES) ×2 IMPLANT
COVER MAYO STAND STRL (DRAPES) ×2 IMPLANT
COVER PROBE W GEL 5X96 (DRAPES) ×2 IMPLANT
DERMABOND ADVANCED (GAUZE/BANDAGES/DRESSINGS) ×1
DERMABOND ADVANCED .7 DNX12 (GAUZE/BANDAGES/DRESSINGS) ×1 IMPLANT
DRAPE LAPAROSCOPIC ABDOMINAL (DRAPES) ×2 IMPLANT
DRAPE UTILITY XL STRL (DRAPES) ×2 IMPLANT
ELECT COATED BLADE 2.86 ST (ELECTRODE) ×2 IMPLANT
ELECT REM PT RETURN 9FT ADLT (ELECTROSURGICAL) ×2
ELECTRODE REM PT RTRN 9FT ADLT (ELECTROSURGICAL) ×1 IMPLANT
GLOVE BIO SURGEON STRL SZ7.5 (GLOVE) ×4 IMPLANT
GLOVE BIOGEL PI IND STRL 7.0 (GLOVE) IMPLANT
GLOVE BIOGEL PI INDICATOR 7.0 (GLOVE) ×1
GLOVE SURG SS PI 6.5 STRL IVOR (GLOVE) ×1 IMPLANT
GOWN STRL REUS W/ TWL LRG LVL3 (GOWN DISPOSABLE) ×2 IMPLANT
GOWN STRL REUS W/TWL LRG LVL3 (GOWN DISPOSABLE) ×4
ILLUMINATOR WAVEGUIDE N/F (MISCELLANEOUS) IMPLANT
KIT MARKER MARGIN INK (KITS) ×2 IMPLANT
LIGHT WAVEGUIDE WIDE FLAT (MISCELLANEOUS) IMPLANT
NDL HYPO 25X1 1.5 SAFETY (NEEDLE) IMPLANT
NEEDLE HYPO 25X1 1.5 SAFETY (NEEDLE) ×2 IMPLANT
NS IRRIG 1000ML POUR BTL (IV SOLUTION) ×1 IMPLANT
PACK BASIN DAY SURGERY FS (CUSTOM PROCEDURE TRAY) ×2 IMPLANT
PENCIL SMOKE EVACUATOR (MISCELLANEOUS) ×2 IMPLANT
SLEEVE SCD COMPRESS KNEE MED (STOCKING) ×2 IMPLANT
SPIKE FLUID TRANSFER (MISCELLANEOUS) IMPLANT
SPONGE T-LAP 18X18 ~~LOC~~+RFID (SPONGE) ×2 IMPLANT
SUT MON AB 4-0 PC3 18 (SUTURE) ×2 IMPLANT
SUT SILK 2 0 SH (SUTURE) IMPLANT
SUT VICRYL 3-0 CR8 SH (SUTURE) ×2 IMPLANT
SYR CONTROL 10ML LL (SYRINGE) IMPLANT
TOWEL GREEN STERILE FF (TOWEL DISPOSABLE) ×2 IMPLANT
TRAY FAXITRON CT DISP (TRAY / TRAY PROCEDURE) ×2 IMPLANT
TUBE CONNECTING 20X1/4 (TUBING) ×2 IMPLANT
YANKAUER SUCT BULB TIP NO VENT (SUCTIONS) IMPLANT

## 2021-10-21 NOTE — Discharge Instructions (Signed)
No tylenol until 7pm, no ibuprofen until 9pm    Post Anesthesia Home Care Instructions  Activity: Get plenty of rest for the remainder of the day. A responsible individual must stay with you for 24 hours following the procedure.  For the next 24 hours, DO NOT: -Drive a car -Paediatric nurse -Drink alcoholic beverages -Take any medication unless instructed by your physician -Make any legal decisions or sign important papers.  Meals: Start with liquid foods such as gelatin or soup. Progress to regular foods as tolerated. Avoid greasy, spicy, heavy foods. If nausea and/or vomiting occur, drink only clear liquids until the nausea and/or vomiting subsides. Call your physician if vomiting continues.  Special Instructions/Symptoms: Your throat may feel dry or sore from the anesthesia or the breathing tube placed in your throat during surgery. If this causes discomfort, gargle with warm salt water. The discomfort should disappear within 24 hours.  If you had a scopolamine patch placed behind your ear for the management of post- operative nausea and/or vomiting:  1. The medication in the patch is effective for 72 hours, after which it should be removed.  Wrap patch in a tissue and discard in the trash. Wash hands thoroughly with soap and water. 2. You may remove the patch earlier than 72 hours if you experience unpleasant side effects which may include dry mouth, dizziness or visual disturbances. 3. Avoid touching the patch. Wash your hands with soap and water after contact with the patch.

## 2021-10-21 NOTE — Interval H&P Note (Signed)
History and Physical Interval Note:  10/21/2021 2:24 PM  Gail Erickson  has presented today for surgery, with the diagnosis of LEFT BREAST MASS.  The various methods of treatment have been discussed with the patient and family. After consideration of risks, benefits and other options for treatment, the patient has consented to  Procedure(s): LEFT BREAST RADIOACTIVE SEED LOCALIZED LUMPECTOMY (Left) as a surgical intervention.  The patient's history has been reviewed, patient examined, no change in status, stable for surgery.  I have reviewed the patient's chart and labs.  Questions were answered to the patient's satisfaction.     Autumn Messing III

## 2021-10-21 NOTE — Anesthesia Procedure Notes (Signed)
Procedure Name: LMA Insertion Date/Time: 10/21/2021 3:03 PM  Performed by: Barnaby Rippeon, Ernesta Amble, CRNAPre-anesthesia Checklist: Patient identified, Emergency Drugs available, Suction available and Patient being monitored Patient Re-evaluated:Patient Re-evaluated prior to induction Oxygen Delivery Method: Circle system utilized Preoxygenation: Pre-oxygenation with 100% oxygen Induction Type: IV induction Ventilation: Mask ventilation without difficulty LMA: LMA inserted LMA Size: 4.0 Number of attempts: 1 Airway Equipment and Method: Bite block Placement Confirmation: positive ETCO2 Tube secured with: Tape Dental Injury: Teeth and Oropharynx as per pre-operative assessment

## 2021-10-21 NOTE — Op Note (Signed)
10/21/2021  3:42 PM  PATIENT:  Gail Erickson  46 y.o. female  PRE-OPERATIVE DIAGNOSIS:  LEFT BREAST MASS  POST-OPERATIVE DIAGNOSIS:  LEFT BREAST MASS  PROCEDURE:  Procedure(s): LEFT BREAST RADIOACTIVE SEED LOCALIZED LUMPECTOMY (Left)  SURGEON:  Surgeon(s) and Role:    [name], MD - Primary  PHYSICIAN ASSISTANT:   ASSISTANTS: none   ANESTHESIA:   local and general  EBL:  minimal   BLOOD ADMINISTERED:none  DRAINS: none   LOCAL MEDICATIONS USED:  MARCAINE     SPECIMEN:  Source of Specimen:  left breast tissue  DISPOSITION OF SPECIMEN:  PATHOLOGY  COUNTS:  YES  TOURNIQUET:  * No tourniquets in log *  DICTATION: .Dragon Dictation  After informed consent was obtained the patient was brought to the operating room and placed in the supine position on the operating table.  After adequate induction of general anesthesia the patient's left breast was prepped with ChloraPrep, allowed to dry, and draped in usual sterile manner.  An appropriate timeout was performed.  Previously an I-125 seed was placed in the lower outer central left breast to mark a discordant mass.  The neoprobe was set to I-125 in the area of radioactivity was readily identified.  The area around this was infiltrated with quarter percent Marcaine.  A curvilinear incision was made along the lower outer edge of the areola of the left breast with a 15 blade knife.  The incision was carried through the skin and subcutaneous tissue sharply with the electrocautery.  Dissection was then carried towards the radioactive seed under the direction of the neoprobe.  Once I more closely approached the radioactive seed I then removed a circular portion of breast tissue sharply with the electrocautery around the radioactive seed while checking the area of radioactivity frequently.  Once the specimen was removed it was oriented with the appropriate paint colors.  A specimen radiograph was obtained that showed the clip and seed  to be within the specimen.  The specimen was then sent to pathology for further evaluation.  Hemostasis was achieved using the Bovie electrocautery.  The wound was irrigated with saline and infiltrated with more quarter percent Marcaine.  The deep layer of the incision was then closed with layers of interrupted 3-0 Vicryl stitches.  The skin was then closed with interrupted 4-0 Monocryl subcuticular stitches.  Dermabond dressings were applied.  The patient tolerated the procedure well.  At the end of the case all needle sponge and instrument counts were correct.  The patient was then awakened and taken to recovery in stable condition.  PLAN OF CARE: Discharge to home after PACU  PATIENT DISPOSITION:  PACU - hemodynamically stable.   Delay start of Pharmacological VTE agent (>24hrs) due to surgical blood loss or risk of bleeding: not applicable

## 2021-10-21 NOTE — H&P (Signed)
PROVIDER: Landry Corporal, MD  MRN: [id number] DOB: 04/08/1975 Subjective   Chief Complaint: Follow-up   History of Present Illness: Gail Erickson is a 46 y.o. female who is seen today for left breast mass. The patient is a 46 year old black female who is about 1 year and 3 months status post right mastectomy and sentinel node biopsy for a T1 a N0 right breast cancer that was ER and PR positive and HER2 negative with a Ki-67 of 1%. She has been on tamoxifen for this. On her recent surveillance MRI she was found to have a 9 mm mass in the lower outer quadrant of the left breast. This was biopsied and came back benign. The radiologist felt that this result was discordant and recommend excision of the area. She has been well since her last visit with no complaints.    Review of Systems: A complete review of systems was obtained from the patient. I have reviewed this information and discussed as appropriate with the patient. See HPI as well for other ROS.  ROS   Medical History: Past Medical History:  Diagnosis Date  History of cancer   Patient Active Problem List  Diagnosis  Malignant neoplasm of overlapping sites of right breast in female, estrogen receptor positive (CMS-HCC)  Mass of lower outer quadrant of left breast   Past Surgical History:  Procedure Laterality Date  MASTECTOMY    No Known Allergies  Current Outpatient Medications on File Prior to Visit  Medication Sig Dispense Refill  tamoxifen (NOLVADEX) 20 MG tablet   No current facility-administered medications on file prior to visit.   Family History  Problem Relation Age of Onset  Stroke Mother  High blood pressure (Hypertension) Mother  Hyperlipidemia (Elevated cholesterol) Mother  Diabetes Mother  Deep vein thrombosis (DVT or abnormal blood clot formation) Mother  Breast cancer Mother    Social History   Tobacco Use  Smoking Status Never  Smokeless Tobacco Never    Social History   Socioeconomic  History  Marital status: Single  Tobacco Use  Smoking status: Never  Smokeless tobacco: Never   Objective:   There were no vitals filed for this visit.  There is no height or weight on file to calculate BMI.  Physical Exam Vitals reviewed.  Constitutional:  General: She is not in acute distress. Appearance: Normal appearance.  HENT:  Head: Normocephalic and atraumatic.  Right Ear: External ear normal.  Left Ear: External ear normal.  Nose: Nose normal.  Mouth/Throat:  Mouth: Mucous membranes are moist.  Pharynx: Oropharynx is clear.  Eyes:  General: No scleral icterus. Extraocular Movements: Extraocular movements intact.  Conjunctiva/sclera: Conjunctivae normal.  Pupils: Pupils are equal, round, and reactive to light.  Cardiovascular:  Rate and Rhythm: Normal rate and regular rhythm.  Pulses: Normal pulses.  Heart sounds: Normal heart sounds.  Pulmonary:  Effort: Pulmonary effort is normal. No respiratory distress.  Breath sounds: Normal breath sounds.  Abdominal:  General: Bowel sounds are normal.  Palpations: Abdomen is soft.  Tenderness: There is no abdominal tenderness.  Musculoskeletal:  General: No swelling, tenderness or deformity. Normal range of motion.  Cervical back: Normal range of motion and neck supple.  Skin: General: Skin is warm and dry.  Coloration: Skin is not jaundiced.  Neurological:  General: No focal deficit present.  Mental Status: She is alert and oriented to person, place, and time.  Psychiatric:  Mood and Affect: Mood normal.  Behavior: Behavior normal.    Breast: There is no palpable  mass in the right reconstructed breast. There is no palpable mass in the left breast. There is no palpable axillary, supraclavicular, or cervical lymphadenopathy.  Labs, Imaging and Diagnostic Testing:  Assessment and Plan:   Diagnoses and all orders for this visit:  Malignant neoplasm of overlapping sites of right breast in female, estrogen  receptor positive (CMS-HCC)  Mass of lower outer quadrant of left breast    The patient is about 1 year and 3 months status post right mastectomy for breast cancer. She now has a 9 mm mass in the lower outer quadrant of the left breast that is discordant. Because of its appearance and with her breast cancer history my recommendation would be to have this area removed. She would also like to have this done. I have discussed with her in detail the risks and benefits of the operation as well as some of the technical aspects including the use of a radioactive seed for localization and she understands and wishes to proceed. She will continue to take tamoxifen but stopped it a week before surgery and then resume afterwards.

## 2021-10-21 NOTE — Anesthesia Preprocedure Evaluation (Addendum)
Anesthesia Evaluation  Patient identified by MRN, date of birth, ID band Patient awake    Reviewed: Allergy & Precautions, H&P , NPO status , Patient's Chart, lab work & pertinent test results  Airway Mallampati: II  TM Distance: >3 FB Neck ROM: Full    Dental no notable dental hx. (+) Dental Advisory Given, Teeth Intact   Pulmonary neg pulmonary ROS,    Pulmonary exam normal breath sounds clear to auscultation       Cardiovascular Exercise Tolerance: Good negative cardio ROS Normal cardiovascular exam Rhythm:Regular Rate:Normal     Neuro/Psych  Headaches, PSYCHIATRIC DISORDERS    GI/Hepatic negative GI ROS, Neg liver ROS,   Endo/Other  negative endocrine ROS  Renal/GU negative Renal ROS     Musculoskeletal   Abdominal (+) + obese,   Peds  Hematology negative hematology ROS (+)   Anesthesia Other Findings   Reproductive/Obstetrics negative OB ROS                            Anesthesia Physical  Anesthesia Plan  ASA: 3  Anesthesia Plan: General   Post-op Pain Management: Tylenol PO (pre-op)*, Celebrex PO (pre-op)* and Gabapentin PO (pre-op)*   Induction: Intravenous  PONV Risk Score and Plan: 4 or greater and Ondansetron, Dexamethasone, Treatment may vary due to age or medical condition and Midazolam  Airway Management Planned: LMA  Additional Equipment: None  Intra-op Plan:   Post-operative Plan: Extubation in OR  Informed Consent: I have reviewed the patients History and Physical, chart, labs and discussed the procedure including the risks, benefits and alternatives for the proposed anesthesia with the patient or authorized representative who has indicated his/her understanding and acceptance.     Dental advisory given  Plan Discussed with: CRNA  Anesthesia Plan Comments:        Anesthesia Quick Evaluation

## 2021-10-21 NOTE — Transfer of Care (Signed)
Immediate Anesthesia Transfer of Care Note  Patient: Gail Erickson  Procedure(s) Performed: LEFT BREAST RADIOACTIVE SEED LOCALIZED LUMPECTOMY (Left: Breast)  Patient Location: PACU  Anesthesia Type:General  Level of Consciousness: drowsy and patient cooperative  Airway & Oxygen Therapy: Patient Spontanous Breathing and Patient connected to face mask oxygen  Post-op Assessment: Report given to RN and Post -op Vital signs reviewed and stable  Post vital signs: Reviewed and stable  Last Vitals:  Vitals Value Taken Time  BP    Temp    Pulse    Resp    SpO2      Last Pain:  Vitals:   10/21/21 1332  TempSrc: Oral  PainSc: 4          Complications: No notable events documented.

## 2021-10-23 NOTE — Anesthesia Postprocedure Evaluation (Signed)
Anesthesia Post Note  Patient: Gail Erickson  Procedure(s) Performed: LEFT BREAST RADIOACTIVE SEED LOCALIZED LUMPECTOMY (Left: Breast)     Patient location during evaluation: PACU Anesthesia Type: General Level of consciousness: sedated and patient cooperative Pain management: pain level controlled Vital Signs Assessment: post-procedure vital signs reviewed and stable Respiratory status: spontaneous breathing Cardiovascular status: stable Anesthetic complications: no   No notable events documented.  Last Vitals:  Vitals:   10/21/21 1630 10/21/21 1646  BP: 114/72 122/81  Pulse: 62 63  Resp: (!) 22 18  Temp:  36.6 C  SpO2: 98% 97%    Last Pain:  Vitals:   10/21/21 1646  TempSrc: Oral  PainSc: Casey

## 2021-10-24 ENCOUNTER — Encounter (HOSPITAL_BASED_OUTPATIENT_CLINIC_OR_DEPARTMENT_OTHER): Payer: Self-pay | Admitting: General Surgery

## 2021-10-24 LAB — SURGICAL PATHOLOGY

## 2021-10-26 DIAGNOSIS — F4323 Adjustment disorder with mixed anxiety and depressed mood: Secondary | ICD-10-CM | POA: Diagnosis not present

## 2021-10-31 ENCOUNTER — Encounter (INDEPENDENT_AMBULATORY_CARE_PROVIDER_SITE_OTHER): Payer: Self-pay | Admitting: Bariatrics

## 2021-10-31 ENCOUNTER — Ambulatory Visit (INDEPENDENT_AMBULATORY_CARE_PROVIDER_SITE_OTHER): Payer: BC Managed Care – PPO | Admitting: Bariatrics

## 2021-10-31 VITALS — BP 114/78 | HR 69 | Temp 98.1°F | Ht 68.0 in | Wt 240.0 lb

## 2021-10-31 DIAGNOSIS — E669 Obesity, unspecified: Secondary | ICD-10-CM

## 2021-10-31 DIAGNOSIS — R7303 Prediabetes: Secondary | ICD-10-CM

## 2021-10-31 DIAGNOSIS — Z6836 Body mass index (BMI) 36.0-36.9, adult: Secondary | ICD-10-CM | POA: Diagnosis not present

## 2021-10-31 DIAGNOSIS — E559 Vitamin D deficiency, unspecified: Secondary | ICD-10-CM | POA: Diagnosis not present

## 2021-10-31 DIAGNOSIS — C50411 Malignant neoplasm of upper-outer quadrant of right female breast: Secondary | ICD-10-CM | POA: Diagnosis not present

## 2021-10-31 MED ORDER — VITAMIN D (ERGOCALCIFEROL) 1.25 MG (50000 UNIT) PO CAPS: 50000.0000 [IU] | ORAL_CAPSULE | ORAL | 0 refills | Status: DC

## 2021-11-01 ENCOUNTER — Ambulatory Visit: Payer: BC Managed Care – PPO | Admitting: Podiatry

## 2021-11-01 ENCOUNTER — Telehealth (INDEPENDENT_AMBULATORY_CARE_PROVIDER_SITE_OTHER): Payer: BC Managed Care – PPO | Admitting: Psychology

## 2021-11-01 ENCOUNTER — Encounter: Payer: Self-pay | Admitting: Podiatry

## 2021-11-01 DIAGNOSIS — N951 Menopausal and female climacteric states: Secondary | ICD-10-CM | POA: Insufficient documentation

## 2021-11-01 DIAGNOSIS — F5089 Other specified eating disorder: Secondary | ICD-10-CM | POA: Diagnosis not present

## 2021-11-01 DIAGNOSIS — M79675 Pain in left toe(s): Secondary | ICD-10-CM | POA: Diagnosis not present

## 2021-11-01 DIAGNOSIS — R7301 Impaired fasting glucose: Secondary | ICD-10-CM | POA: Insufficient documentation

## 2021-11-01 DIAGNOSIS — B351 Tinea unguium: Secondary | ICD-10-CM

## 2021-11-01 DIAGNOSIS — M79674 Pain in right toe(s): Secondary | ICD-10-CM

## 2021-11-01 DIAGNOSIS — C50411 Malignant neoplasm of upper-outer quadrant of right female breast: Secondary | ICD-10-CM | POA: Diagnosis not present

## 2021-11-01 DIAGNOSIS — L680 Hirsutism: Secondary | ICD-10-CM | POA: Insufficient documentation

## 2021-11-01 DIAGNOSIS — E7800 Pure hypercholesterolemia, unspecified: Secondary | ICD-10-CM | POA: Insufficient documentation

## 2021-11-01 DIAGNOSIS — E8881 Metabolic syndrome: Secondary | ICD-10-CM | POA: Insufficient documentation

## 2021-11-01 MED ORDER — TERBINAFINE HCL 250 MG PO TABS: 250.0000 mg | ORAL_TABLET | Freq: Every day | ORAL | 0 refills | Status: DC

## 2021-11-01 NOTE — Progress Notes (Signed)
  Office: [phone]  /  Fax: [phone]    Date: November 01, 2021    Appointment Start Time: 2:00pm Duration: 29 minutes Provider: Glennie Isle, Psy.D. Type of Session: Individual Therapy  Location of Patient: Parked in car (safe/private location) Location of Provider: Provider's Home (private office) Type of Contact: Telepsychological Visit via MyChart Video Visit  Session Content: Gail Erickson is a 46 y.o. female presenting for a follow-up appointment to address the previously established treatment goal of increasing coping skills.Today's appointment was a telepsychological visit. Gail Erickson provided verbal consent for today's telepsychological appointment and she is aware she is responsible for securing confidentiality on her end of the session. Prior to proceeding with today's appointment, Gail Erickson's physical location at the time of this appointment was obtained as well a phone number she could be reached at in the event of technical difficulties. Gail Erickson and this provider participated in today's telepsychological service.   This provider conducted a brief check-in. Gail Erickson shared about her recent surgery, noting, "It went well." She further shared she accepted a job offer last week. Gail Erickson acknowledged drinking soda and candy recently. Further explored and processed. Psychoeducation regarding triggers for emotional eating was provided. Gail Erickson was provided a handout, and encouraged to utilize the handout between now and the next appointment to increase awareness of triggers and frequency. Gail Erickson agreed. This provider also discussed behavioral strategies for specific triggers, such as placing the utensil down when conversing to avoid mindless eating. Gail Erickson provided verbal consent during today's appointment for this provider to send a handout about triggers via e-mail. Overall, Gail Erickson was receptive to today's appointment as evidenced by openness to sharing, responsiveness to feedback, and willingness to explore  triggers for emotional eating.  Mental Status Examination:  Appearance: neat Behavior: appropriate to circumstances Mood: neutral Affect: mood congruent Speech: WNL Eye Contact: appropriate Psychomotor Activity: WNL Gait: unable to assess Thought Process: linear, logical, and goal directed and no evidence or endorsement of suicidal, homicidal, and self-harm ideation, plan and intent  Thought Content/Perception: no hallucinations, delusions, bizarre thinking or behavior endorsed or observed Orientation: AAOx4 Memory/Concentration: memory, attention, language, and fund of knowledge intact  Insight: fair Judgment: fair  Interventions:  Conducted a brief chart review Provided empathic reflections and validation Employed supportive psychotherapy interventions to facilitate reduced distress and to improve coping skills with identified stressors Psychoeducation provided regarding triggers for emotional eating behaviors  DSM-5 Diagnosis(es): F50.89 Other Specified Feeding or Eating Disorder, Emotional Eating Behaviors  Treatment Goal & Progress: During the initial appointment with this provider, the following treatment goal was established: increase coping skills. Gail Erickson has demonstrated progress in her goal as evidenced by increased awareness of hunger patterns. Gail Erickson also continues to demonstrate willingness to engage in learned skill(s).  Plan: The next appointment is scheduled for 11/15/2021 at 10am, which will be via MyChart Video Visit. The next session will focus on reviewing triggers for emotional eating behaviors and working towards the established treatment goal.

## 2021-11-02 ENCOUNTER — Other Ambulatory Visit (INDEPENDENT_AMBULATORY_CARE_PROVIDER_SITE_OTHER): Payer: Self-pay | Admitting: Bariatrics

## 2021-11-02 DIAGNOSIS — F4323 Adjustment disorder with mixed anxiety and depressed mood: Secondary | ICD-10-CM | POA: Diagnosis not present

## 2021-11-02 DIAGNOSIS — E559 Vitamin D deficiency, unspecified: Secondary | ICD-10-CM

## 2021-11-02 NOTE — Progress Notes (Signed)
  Subjective:  Patient ID: Gail Erickson, female    DOB: [birth year],  MRN: [id number]  Chief Complaint  Patient presents with   Nail Problem    - Fungal infection of toenail Referring Provider: Vena Rua-  Right toes, starting to become painful - patient is interested in something topical    46 y.o. female presents with the above complaint. History confirmed with patient.  Has been like this for some time  Objective:  Physical Exam: warm, good capillary refill, no trophic changes or ulcerative lesions, normal DP and PT pulses, normal sensory exam, and onychomycosis of all 5 toenails of the right foot with significant dystrophic growth and thickening and yellow discoloration with subungual debris and crumbly texture    Assessment:   1. Pain due to onychomycosis of toenails of both feet      Plan:  Patient was evaluated and treated and all questions answered.  Discussed the etiology and treatment options for the condition in detail with the patient. Educated patient on the topical and oral treatment options for mycotic nails. Recommended debridement of the nails today. Sharp and mechanical debridement performed of all painful and mycotic nails today. Nails debrided in length and thickness using a nail nipper to level of comfort.  I do not think topical medications will be effective.  We also discussed laser treatment.  I recommended oral treatment and Lamisil Rx was sent to pharmacy.  We discussed its use.  Her LFTs recently were normal.  No history of liver disease.  I will see her back in 3 months for follow-up    Return in about 3 months (around 02/01/2022) for follow up after nail fungus treatment.

## 2021-11-02 NOTE — Progress Notes (Unsigned)
Chief Complaint:   OBESITY Gail Erickson is here to discuss her progress with her obesity treatment plan along with follow-up of her obesity related diagnoses. Mizani is on the Category 2 Plan and states she is following her eating plan approximately 20% of the time. Lucita states she is not currently exercising.  Today's visit was #: 3 Starting weight: 241 lbs Starting date: 09/14/2021 Today's weight: 240 lbs Today's date: 10/31/21 Total lbs lost to date: 1 Total lbs lost since last in-office visit: +1  Interim History: She is up 1 pound since her last visit. She had surgery left lumpectomy last week and has been off her meal plan.  Subjective:   1. Vitamin D deficiency Taking as directed.  Increased fatigue.  2. Prediabetes No medication.  Assessment/Plan:   1. Vitamin D deficiency Refill - Vitamin D, Ergocalciferol, (DRISDOL) 1.25 MG (50000 UNIT) CAPS capsule; Take 1 capsule (50,000 Units total) by mouth every 7 (seven) days.  Dispense: 5 capsule; Refill: 0  2. Prediabetes Will adhere closely to the plan.  3. Obesity, Current BMI 36.5 1.  Meal planning 2.  Intentional eating 3.  We will get back to her plan 80 to 90%. 4.  Decrease sodas.  Cuma is currently in the action stage of change. As such, her goal is to continue with weight loss efforts. She has agreed to the Category 2 Plan.   Exercise goals: Will incorporate her exercise as tolerated.  Behavioral modification strategies: increasing lean protein intake, decreasing simple carbohydrates, increasing vegetables, increasing water intake, decreasing eating out, no skipping meals, meal planning and cooking strategies, keeping healthy foods in the home, and planning for success.  Kim has agreed to follow-up with our clinic in 2-3 weeks. She was informed of the importance of frequent follow-up visits to maximize her success with intensive lifestyle modifications for her multiple health conditions.   Objective:   Blood  pressure 114/78, pulse 69, temperature 98.1 F (36.7 C), height '5\' 8"'$  (1.727 m), weight 240 lb (108.9 kg), last menstrual period 09/26/2021, SpO2 96 %. Body mass index is 36.49 kg/m.  General: Cooperative, alert, well developed, in no acute distress. HEENT: Conjunctivae and lids unremarkable. Cardiovascular: Regular rhythm.  Lungs: Normal work of breathing. Neurologic: No focal deficits.   Lab Results  Component Value Date   CREATININE 1.06 (H) 09/06/2021   BUN 11 09/06/2021   NA 142 09/06/2021   K 3.8 09/06/2021   CL 112 (H) 09/06/2021   CO2 26 09/06/2021   Lab Results  Component Value Date   ALT 18 09/06/2021   AST 23 09/06/2021   ALKPHOS 34 (L) 09/06/2021   BILITOT 0.6 09/06/2021   Lab Results  Component Value Date   HGBA1C 5.5 09/14/2021   HGBA1C 5.4 06/13/2017   HGBA1C 5.1 07/01/2014   Lab Results  Component Value Date   INSULIN 17.5 09/14/2021   Lab Results  Component Value Date   TSH 1.120 09/14/2021   Lab Results  Component Value Date   CHOL 179 09/14/2021   HDL 46 09/14/2021   LDLCALC 119 (H) 09/14/2021   TRIG 77 09/14/2021   CHOLHDL 3.8 07/28/2021   Lab Results  Component Value Date   VD25OH 27.3 (L) 09/14/2021   VD25OH 23 (L) 07/28/2021   VD25OH 32.8 06/02/2019   Lab Results  Component Value Date   WBC 3.8 (L) 09/06/2021   HGB 10.8 (L) 09/06/2021   HCT 33.1 (L) 09/06/2021   MCV 86.3 09/06/2021   PLT  230 09/06/2021   Lab Results  Component Value Date   IRON 67 09/06/2021   TIBC 309 09/06/2021   FERRITIN 77 09/06/2021    Attestation Statements:   Reviewed by clinician on day of visit: allergies, medications, problem list, medical history, surgical history, family history, social history, and previous encounter notes.  I, Dawn Whitmire, FNP-C, am acting as transcriptionist for Dr. Jearld Lesch.  I have reviewed the above documentation for accuracy and completeness, and I agree with the above. Jearld Lesch, DO

## 2021-11-03 ENCOUNTER — Ambulatory Visit: Payer: BC Managed Care – PPO | Admitting: Surgical

## 2021-11-03 ENCOUNTER — Encounter (INDEPENDENT_AMBULATORY_CARE_PROVIDER_SITE_OTHER): Payer: Self-pay | Admitting: Bariatrics

## 2021-11-15 ENCOUNTER — Telehealth (INDEPENDENT_AMBULATORY_CARE_PROVIDER_SITE_OTHER): Payer: BC Managed Care – PPO | Admitting: Psychology

## 2021-11-15 DIAGNOSIS — F5089 Other specified eating disorder: Secondary | ICD-10-CM | POA: Diagnosis not present

## 2021-11-15 NOTE — Progress Notes (Signed)
  Office: [phone]  /  Fax: [phone]    Date: November 15, 2021    Appointment Start Time: 10:03am Duration: 22 minutes Provider: Glennie Isle, Psy.D. Type of Session: Individual Therapy  Location of Patient: Home (private location) Location of Provider: Provider's Home (private office) Type of Contact: Telepsychological Visit via MyChart Video Visit  Session Content: Maleea is a 46 y.o. female presenting for a follow-up appointment to address the previously established treatment goal of increasing coping skills.Today's appointment was a telepsychological visit. Floyd provided verbal consent for today's telepsychological appointment and she is aware she is responsible for securing confidentiality on her end of the session. Prior to proceeding with today's appointment, Crystall's physical location at the time of this appointment was obtained as well a phone number she could be reached at in the event of technical difficulties. Imya and this provider participated in today's telepsychological service.   This provider conducted a brief check-in. Shikara shared about recent events, including her recent vacation. Regarding eating habits, she shared, "I feel I did pretty good with the eating at the beach." She shared examples of her eating habits and described making better choices and engaging in portion control. Positive reinforcement was provided. Reviewed triggers for emotional eating behaviors. Nyeisha was engaged in problem solving to develop a plan to help cope with urges/cravings involving activities to relax, activities to distract, comforting places, people to call and connect with, and activities that help soothe senses. She was observed writing the plan. Overall, Elliana was receptive to today's appointment as evidenced by openness to sharing, responsiveness to feedback, and willingness to implement discussed strategies .  Mental Status Examination:  Appearance: neat Behavior: appropriate to  circumstances Mood: neutral Affect: mood congruent Speech: WNL Eye Contact: appropriate Psychomotor Activity: WNL Gait: unable to assess Thought Process: linear, logical, and goal directed and no evidence or endorsement of suicidal, homicidal, and self-harm ideation, plan and intent  Thought Content/Perception: no hallucinations, delusions, bizarre thinking or behavior endorsed or observed Orientation: AAOx4 Memory/Concentration: memory, attention, language, and fund of knowledge intact  Insight: good Judgment: fair  Interventions:  Conducted a brief chart review Provided empathic reflections and validation Reviewed content from the previous session Provided positive reinforcement Employed supportive psychotherapy interventions to facilitate reduced distress and to improve coping skills with identified stressors Engaged patient in problem solving  DSM-5 Diagnosis(es): F50.89 Other Specified Feeding or Eating Disorder, Emotional Eating Behaviors  Treatment Goal & Progress: During the initial appointment with this provider, the following treatment goal was established: increase coping skills. Rilynn has demonstrated progress in her goal as evidenced by increased awareness of hunger patterns and increased awareness of triggers for emotional eating behaviors. Concepcion also continues to demonstrate willingness to engage in learned skill(s).  Plan: Due to Tashiana's upcoming vacation, the next appointment is scheduled for 12/06/2021 at 4:30pm, which will be via MyChart Video Visit. The next session will focus on working towards the established treatment goal. Raihana will continue meeting with her primary therapist.

## 2021-11-16 ENCOUNTER — Encounter (HOSPITAL_COMMUNITY): Payer: Self-pay

## 2021-11-16 DIAGNOSIS — F4323 Adjustment disorder with mixed anxiety and depressed mood: Secondary | ICD-10-CM | POA: Diagnosis not present

## 2021-11-18 DIAGNOSIS — L680 Hirsutism: Secondary | ICD-10-CM | POA: Diagnosis not present

## 2021-11-18 DIAGNOSIS — R7301 Impaired fasting glucose: Secondary | ICD-10-CM | POA: Diagnosis not present

## 2021-11-22 ENCOUNTER — Ambulatory Visit (INDEPENDENT_AMBULATORY_CARE_PROVIDER_SITE_OTHER): Payer: BC Managed Care – PPO | Admitting: Bariatrics

## 2021-11-22 VITALS — BP 120/75 | HR 84 | Temp 97.9°F | Ht 68.0 in | Wt 242.0 lb

## 2021-11-22 DIAGNOSIS — F5089 Other specified eating disorder: Secondary | ICD-10-CM | POA: Diagnosis not present

## 2021-11-22 DIAGNOSIS — E669 Obesity, unspecified: Secondary | ICD-10-CM

## 2021-11-22 DIAGNOSIS — E559 Vitamin D deficiency, unspecified: Secondary | ICD-10-CM | POA: Diagnosis not present

## 2021-11-22 DIAGNOSIS — Z6836 Body mass index (BMI) 36.0-36.9, adult: Secondary | ICD-10-CM

## 2021-11-22 MED ORDER — VITAMIN D (ERGOCALCIFEROL) 1.25 MG (50000 UNIT) PO CAPS: 50000.0000 [IU] | ORAL_CAPSULE | ORAL | 0 refills | Status: DC

## 2021-11-22 NOTE — Progress Notes (Signed)
Chief Complaint:   OBESITY Gail Erickson is here to discuss her progress with her obesity treatment plan along with follow-up of her obesity related diagnoses. Gail Erickson is on the Category 2 Plan and states she is following her eating plan approximately 45% of the time. Gail Erickson states she is walking for 40 minutes 3 times per week.  Today's visit was #: 4 Starting weight: 241 lbs Starting date: 09/14/2021 Today's weight: 242 lbs Today's date: 11/22/2021 Total lbs lost to date: 0 Total lbs lost since last in-office visit: 0  Interim History: Gail Erickson is up 2 lbs since her last visit. She has been traveling. She is not getting enough vegetables.   Subjective:   1. Vitamin D deficiency Gail Erickson is taking Vitamin D prescription.   2. Other disorder of eating Gail Erickson is seeing Dr. Mallie Mussel.   Assessment/Plan:   1. Vitamin D deficiency We will refill prescription Vitamin D for 1 month. Gail Erickson will follow-up for routine testing of Vitamin D, at least 2-3 times per year to avoid over-replacement.  - Vitamin D, Ergocalciferol, (DRISDOL) 1.25 MG (50000 UNIT) CAPS capsule; Take 1 capsule (50,000 Units total) by mouth every 7 (seven) days.  Dispense: 5 capsule; Refill: 0  2. Other disorder of eating We discussed controlling habits such as, food choices.   3. Obesity, Current BMI 36.8 Gail Erickson is currently in the action stage of change. As such, her goal is to continue with weight loss efforts. She has agreed to the Category 2 Plan.   Meal planning and mindful eating were discussed. She will follow the plan 80-90%.   Exercise goals: As is.   Behavioral modification strategies: increasing lean protein intake, decreasing simple carbohydrates, increasing vegetables, increasing water intake, decreasing eating out, no skipping meals, meal planning and cooking strategies, keeping healthy foods in the home, and planning for success.  Gail Erickson has agreed to follow-up with our clinic in 2 weeks. She was informed of the  importance of frequent follow-up visits to maximize her success with intensive lifestyle modifications for her multiple health conditions.   Objective:   Blood pressure 120/75, pulse 84, temperature 97.9 F (36.6 C), height '5\' 8"'$  (1.727 m), weight 242 lb (109.8 kg), SpO2 99 %. Body mass index is 36.8 kg/m.  General: Cooperative, alert, well developed, in no acute distress. HEENT: Conjunctivae and lids unremarkable. Cardiovascular: Regular rhythm.  Lungs: Normal work of breathing. Neurologic: No focal deficits.   Lab Results  Component Value Date   CREATININE 0.88 11/24/2021   BUN 10 11/24/2021   NA 139 11/24/2021   K 3.6 11/24/2021   CL 109 11/24/2021   CO2 25 11/24/2021   Lab Results  Component Value Date   ALT 15 11/24/2021   AST 21 11/24/2021   ALKPHOS 41 11/24/2021   BILITOT 0.6 11/24/2021   Lab Results  Component Value Date   HGBA1C 5.5 09/14/2021   HGBA1C 5.4 06/13/2017   HGBA1C 5.1 07/01/2014   Lab Results  Component Value Date   INSULIN 17.5 09/14/2021   Lab Results  Component Value Date   TSH 1.120 09/14/2021   Lab Results  Component Value Date   CHOL 179 09/14/2021   HDL 46 09/14/2021   LDLCALC 119 (H) 09/14/2021   TRIG 77 09/14/2021   CHOLHDL 3.8 07/28/2021   Lab Results  Component Value Date   VD25OH 27.3 (L) 09/14/2021   VD25OH 23 (L) 07/28/2021   VD25OH 32.8 06/02/2019   Lab Results  Component Value Date   WBC  3.9 (L) 11/24/2021   HGB 11.9 (L) 11/24/2021   HCT 34.6 (L) 11/24/2021   MCV 84.8 11/24/2021   PLT 274 11/24/2021   Lab Results  Component Value Date   IRON 67 09/06/2021   TIBC 309 09/06/2021   FERRITIN 77 09/06/2021   Attestation Statements:   Reviewed by clinician on day of visit: allergies, medications, problem list, medical history, surgical history, family history, social history, and previous encounter notes.   Wilhemena Durie, am acting as Location manager for [employer], DO.  I have reviewed the above  documentation for accuracy and completeness, and I agree with the above. Jearld Lesch, DO

## 2021-11-24 ENCOUNTER — Inpatient Hospital Stay: Payer: BC Managed Care – PPO | Attending: Nurse Practitioner

## 2021-11-24 ENCOUNTER — Other Ambulatory Visit: Payer: BC Managed Care – PPO

## 2021-11-24 ENCOUNTER — Other Ambulatory Visit: Payer: Self-pay

## 2021-11-24 DIAGNOSIS — D649 Anemia, unspecified: Secondary | ICD-10-CM

## 2021-11-24 DIAGNOSIS — L680 Hirsutism: Secondary | ICD-10-CM | POA: Diagnosis not present

## 2021-11-24 DIAGNOSIS — C50811 Malignant neoplasm of overlapping sites of right female breast: Secondary | ICD-10-CM | POA: Diagnosis not present

## 2021-11-24 LAB — CBC WITH DIFFERENTIAL (CANCER CENTER ONLY)
Abs Immature Granulocytes: 0.01 10*3/uL (ref 0.00–0.07)
Basophils Absolute: 0 10*3/uL (ref 0.0–0.1)
Basophils Relative: 1 %
Eosinophils Absolute: 0.1 10*3/uL (ref 0.0–0.5)
Eosinophils Relative: 3 %
HCT: 34.6 % — ABNORMAL LOW (ref 36.0–46.0)
Hemoglobin: 11.9 g/dL — ABNORMAL LOW (ref 12.0–15.0)
Immature Granulocytes: 0 %
Lymphocytes Relative: 42 %
Lymphs Abs: 1.6 10*3/uL (ref 0.7–4.0)
MCH: 29.2 pg (ref 26.0–34.0)
MCHC: 34.4 g/dL (ref 30.0–36.0)
MCV: 84.8 fL (ref 80.0–100.0)
Monocytes Absolute: 0.4 10*3/uL (ref 0.1–1.0)
Monocytes Relative: 9 %
Neutro Abs: 1.8 10*3/uL (ref 1.7–7.7)
Neutrophils Relative %: 45 %
Platelet Count: 274 10*3/uL (ref 150–400)
RBC: 4.08 MIL/uL (ref 3.87–5.11)
RDW: 13.6 % (ref 11.5–15.5)
WBC Count: 3.9 10*3/uL — ABNORMAL LOW (ref 4.0–10.5)
nRBC: 0 % (ref 0.0–0.2)

## 2021-11-24 LAB — CMP (CANCER CENTER ONLY)
ALT: 15 U/L (ref 0–44)
AST: 21 U/L (ref 15–41)
Albumin: 4.3 g/dL (ref 3.5–5.0)
Alkaline Phosphatase: 41 U/L (ref 38–126)
Anion gap: 5 (ref 5–15)
BUN: 10 mg/dL (ref 6–20)
CO2: 25 mmol/L (ref 22–32)
Calcium: 9.4 mg/dL (ref 8.9–10.3)
Chloride: 109 mmol/L (ref 98–111)
Creatinine: 0.88 mg/dL (ref 0.44–1.00)
GFR, Estimated: >60 mL/min (ref >60)
Glucose, Bld: 105 mg/dL — ABNORMAL HIGH (ref 70–99)
Potassium: 3.6 mmol/L (ref 3.5–5.1)
Sodium: 139 mmol/L (ref 135–145)
Total Bilirubin: 0.6 mg/dL (ref 0.3–1.2)
Total Protein: 7.2 g/dL (ref 6.5–8.1)

## 2021-11-30 ENCOUNTER — Encounter: Payer: Self-pay | Admitting: Bariatrics

## 2021-12-06 ENCOUNTER — Telehealth (INDEPENDENT_AMBULATORY_CARE_PROVIDER_SITE_OTHER): Payer: BC Managed Care – PPO | Admitting: Psychology

## 2021-12-06 DIAGNOSIS — F5089 Other specified eating disorder: Secondary | ICD-10-CM

## 2021-12-06 NOTE — Progress Notes (Signed)
  Office: [phone]  /  Fax: [phone]    Date: December 06, 2021    Appointment Start Time: 4:33pm Duration: 24 minutes Provider: Glennie Isle, Psy.D. Type of Session: Individual Therapy  Location of Patient: Parked in car (private/safe location; address obtained) Location of Provider: Provider's Home (private office) Type of Contact: Telepsychological Visit via MyChart Video Visit  Session Content: Gail Erickson is a 46 y.o. female presenting for a follow-up appointment to address the previously established treatment goal of increasing coping skills.Today's appointment was a telepsychological visit. Gail Erickson provided verbal consent for today's telepsychological appointment and she is aware she is responsible for securing confidentiality on her end of the session. Prior to proceeding with today's appointment, Gail Erickson's physical location at the time of this appointment was obtained as well a phone number she could be reached at in the event of technical difficulties. Gail Erickson and this provider participated in today's telepsychological service.   This provider conducted a brief check-in. [name] stated she started working yesterday and she shared about her birthday trip to [place]. She acknowledged deviations from her structured meal plan, but described making better choices and engaging in portion control.This provider and Gail Erickson discussed what went well and what she could do differently on future trips. Gail Erickson of today's appointment focused on developing a routine with her eating habits that fits her new work schedule. Overall, Gail Erickson was Gail Erickson to today's appointment as evidenced by openness to sharing, responsiveness to feedback, and willingness to implement discussed strategies .  Mental Status Examination:  Appearance: neat Behavior: appropriate to circumstances Mood: neutral Affect: mood congruent Speech: WNL Eye Contact: appropriate Psychomotor Activity: WNL Gait: unable to assess Thought  Process: linear, logical, and goal directed and no evidence or endorsement of suicidal, homicidal, and self-harm ideation, plan and intent  Thought Content/Perception: no hallucinations, delusions, bizarre thinking or behavior endorsed or observed Orientation: AAOx4 Memory/Concentration: memory, attention, language, and fund of knowledge intact  Insight: good Judgment: fair  Interventions:  Conducted a brief chart review Provided empathic reflections and validation Employed supportive psychotherapy interventions to facilitate reduced distress and to improve coping skills with identified stressors Engaged patient in problem solving  DSM-5 Diagnosis(es): F50.89 Other Specified Feeding or Eating Disorder, Emotional Eating Behaviors  Treatment Goal & Progress: During the initial appointment with this provider, the following treatment goal was established: increase coping skills. Gail Erickson has demonstrated progress in her goal as evidenced by increased awareness of hunger patterns and increased awareness of triggers for emotional eating behaviors. Gail Erickson also continues to demonstrate willingness to engage in learned skill(s).  Plan: The next appointment is scheduled for 12/19/2021 at 4:30pm, which will be via MyChart Video Visit. The next session will focus on working towards the established treatment goal. Gail Erickson will continue meeting with her primary therapist.

## 2021-12-08 DIAGNOSIS — F4323 Adjustment disorder with mixed anxiety and depressed mood: Secondary | ICD-10-CM | POA: Diagnosis not present

## 2021-12-19 ENCOUNTER — Telehealth (INDEPENDENT_AMBULATORY_CARE_PROVIDER_SITE_OTHER): Payer: BC Managed Care – PPO | Admitting: Psychology

## 2021-12-19 DIAGNOSIS — F5089 Other specified eating disorder: Secondary | ICD-10-CM | POA: Diagnosis not present

## 2021-12-19 NOTE — Progress Notes (Signed)
  Office: [phone]  /  Fax: [phone]    Date: December 19, 2021    Appointment Start Time: 4:33pm Duration: 24 minutes Provider: Glennie Isle, Psy.D. Type of Session: Individual Therapy  Location of Patient: Work (private location) Location of Provider: Provider's Home (private office) Type of Contact: Telepsychological Visit via MyChart Video Visit  Session Content: Gail Erickson is a 46 y.o. female presenting for a follow-up appointment to address the previously established treatment goal of increasing coping skills.Today's appointment was a telepsychological visit. Gail Erickson provided verbal consent for today's telepsychological appointment and she is aware she is responsible for securing confidentiality on her end of the session. Prior to proceeding with today's appointment, Gail Erickson's physical location at the time of this appointment was obtained as well a phone number she could be reached at in the event of technical difficulties. Gail Erickson and this provider participated in today's telepsychological service.   This provider conducted a brief check-in. 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 Psychoeducation provided regarding self-compassion. Gail Erickson was engaged in a self-compassion exercise to help with eating-related challenges and other ongoing stressors. She was encouraged to regularly ask herself, "What do I need right now?" Gail Erickson was receptive to today's appointment as evidenced by openness to sharing, responsiveness to feedback, and willingness to work toward increasing self-compassion.  Mental Status Examination:  Appearance: neat Behavior: appropriate to circumstances Mood: neutral Affect: mood congruent Speech: WNL Eye Contact: appropriate Psychomotor Activity: WNL Gait: unable to assess Thought Process: linear, logical, and goal directed and no evidence or endorsement of suicidal, homicidal, and self-harm ideation, plan and  intent  Thought Content/Perception: no hallucinations, delusions, bizarre thinking or behavior endorsed or observed Orientation: AAOx4 Memory/Concentration: memory, attention, language, and fund of knowledge intact  Insight: good Judgment: fair  Interventions:  Conducted a brief chart review Provided empathic reflections and validation Provided positive reinforcement Employed supportive psychotherapy interventions to facilitate reduced distress and to improve coping skills with identified stressors Psychoeducation provided regarding self-compassion Engaged pt in a self-compassion exercise  DSM-5 Diagnosis(es): F50.89 Other Specified Feeding or Eating Disorder, Emotional Eating Behaviors  Treatment Goal & Progress: During the initial appointment with this provider, the following treatment goal was established: increase coping skills. Gail Erickson has demonstrated progress in her goal as evidenced by increased awareness of hunger patterns and increased awareness of triggers for emotional eating behaviors. Gail Erickson also continues to demonstrate willingness to engage in learned skill(s).  Plan: The next appointment is scheduled for 01/03/2022 at 4:30pm, which will be via MyChart Video Visit. The next session will focus on working towards the established treatment goal. Gail Erickson will continue with her primary therapist.

## 2021-12-21 ENCOUNTER — Encounter: Payer: Self-pay | Admitting: Bariatrics

## 2021-12-21 ENCOUNTER — Ambulatory Visit (INDEPENDENT_AMBULATORY_CARE_PROVIDER_SITE_OTHER): Payer: 59 | Admitting: Bariatrics

## 2021-12-21 VITALS — BP 121/81 | Ht 68.0 in | Wt 242.0 lb

## 2021-12-21 DIAGNOSIS — E782 Mixed hyperlipidemia: Secondary | ICD-10-CM

## 2021-12-21 DIAGNOSIS — E669 Obesity, unspecified: Secondary | ICD-10-CM

## 2021-12-21 DIAGNOSIS — R7303 Prediabetes: Secondary | ICD-10-CM

## 2021-12-21 DIAGNOSIS — Z6836 Body mass index (BMI) 36.0-36.9, adult: Secondary | ICD-10-CM

## 2021-12-21 DIAGNOSIS — E559 Vitamin D deficiency, unspecified: Secondary | ICD-10-CM | POA: Diagnosis not present

## 2021-12-21 MED ORDER — VITAMIN D (ERGOCALCIFEROL) 1.25 MG (50000 UNIT) PO CAPS: 50000.0000 [IU] | ORAL_CAPSULE | ORAL | 0 refills | Status: DC

## 2021-12-22 DIAGNOSIS — F4323 Adjustment disorder with mixed anxiety and depressed mood: Secondary | ICD-10-CM | POA: Diagnosis not present

## 2021-12-23 ENCOUNTER — Other Ambulatory Visit: Payer: Self-pay | Admitting: Hematology

## 2021-12-23 ENCOUNTER — Other Ambulatory Visit: Payer: Self-pay | Admitting: General Surgery

## 2021-12-23 DIAGNOSIS — R928 Other abnormal and inconclusive findings on diagnostic imaging of breast: Secondary | ICD-10-CM

## 2021-12-26 ENCOUNTER — Encounter: Payer: Self-pay | Admitting: Bariatrics

## 2021-12-26 NOTE — Progress Notes (Signed)
Chief Complaint:   OBESITY Gail Erickson is here to discuss her progress with her obesity treatment plan along with follow-up of her obesity related diagnoses. Gail Erickson is on the Category 2 Plan and states she is following her eating plan approximately 50% of the time. Gail Erickson states she is walking 2-4 miles 4 times per week.    Today's visit was #: 5 Starting weight: 241 lbs Starting date: 09/14/2021 Today's weight: 242 lbs Today's date: 12/21/2021 Total lbs lost to date: 0 Total lbs lost since last in-office visit: 0  Interim History: Gail Erickson's weight remains the same. She has been on vacation and she started a new job. She is drinking more juice, but less soda.   Subjective:   1. Vitamin D deficiency Gail Erickson is taking Vitamin D as directed.   2. Prediabetes Gail Erickson is not on medications currently.   3. Mixed hyperlipidemia Gail Erickson is taking Omega 3 fish oil and multivitamins. Last total cholesterol was 207, LDL 144, triglycerides 99, and HDL 45.  Assessment/Plan:   1. Vitamin D deficiency Tucker will continue prescription Vitamin D 50,000 IU once weekly, and we will refill for 1 month.  - Vitamin D, Ergocalciferol, (DRISDOL) 1.25 MG (50000 UNIT) CAPS capsule; Take 1 capsule (50,000 Units total) by mouth every 7 (seven) days.  Dispense: 5 capsule; Refill: 0  2. Prediabetes Gail Erickson will keep all carbohydrates low (sweets and starches).  3. Mixed hyperlipidemia Gail Erickson is to eliminate trans fats and saturated fats except for, diary, unprocessed meats, and dark chocolate.  4. Obesity, Current BMI 36.9 Gail Erickson is currently in the action stage of change. As such, her goal is to continue with weight loss efforts. She has agreed to the Category 2 Plan.   Intentional eating was discussed. She will adhere closely to the plan 80-90%. Plan for eating out.   Exercise goals: As is.   Behavioral modification strategies: increasing lean protein intake, decreasing simple carbohydrates, increasing  vegetables, increasing water intake, decreasing eating out, no skipping meals, meal planning and cooking strategies, keeping healthy foods in the home, and planning for success.  Gail Erickson has agreed to follow-up with our clinic in 2 to 3 weeks. She was informed of the importance of frequent follow-up visits to maximize her success with intensive lifestyle modifications for her multiple health conditions.   Objective:   Blood pressure 121/81, height '5\' 8"'$  (1.727 m), weight 242 lb (109.8 kg). Body mass index is 36.8 kg/m.  General: Cooperative, alert, well developed, in no acute distress. HEENT: Conjunctivae and lids unremarkable. Cardiovascular: Regular rhythm.  Lungs: Normal work of breathing. Neurologic: No focal deficits.   Lab Results  Component Value Date   CREATININE 0.88 11/24/2021   BUN 10 11/24/2021   NA 139 11/24/2021   K 3.6 11/24/2021   CL 109 11/24/2021   CO2 25 11/24/2021   Lab Results  Component Value Date   ALT 15 11/24/2021   AST 21 11/24/2021   ALKPHOS 41 11/24/2021   BILITOT 0.6 11/24/2021   Lab Results  Component Value Date   HGBA1C 5.5 09/14/2021   HGBA1C 5.4 06/13/2017   HGBA1C 5.1 07/01/2014   Lab Results  Component Value Date   INSULIN 17.5 09/14/2021   Lab Results  Component Value Date   TSH 1.120 09/14/2021   Lab Results  Component Value Date   CHOL 179 09/14/2021   HDL 46 09/14/2021   LDLCALC 119 (H) 09/14/2021   TRIG 77 09/14/2021   CHOLHDL 3.8 07/28/2021   Lab  Results  Component Value Date   VD25OH 27.3 (L) 09/14/2021   VD25OH 23 (L) 07/28/2021   VD25OH 32.8 06/02/2019   Lab Results  Component Value Date   WBC 3.9 (L) 11/24/2021   HGB 11.9 (L) 11/24/2021   HCT 34.6 (L) 11/24/2021   MCV 84.8 11/24/2021   PLT 274 11/24/2021   Lab Results  Component Value Date   IRON 67 09/06/2021   TIBC 309 09/06/2021   FERRITIN 77 09/06/2021   Attestation Statements:   Reviewed by clinician on day of visit: allergies, medications,  problem list, medical history, surgical history, family history, social history, and previous encounter notes.   Wilhemena Durie, am acting as Location manager for [employer], DO.  I have reviewed the above documentation for accuracy and completeness, and I agree with the above. Jearld Lesch, DO

## 2022-01-03 ENCOUNTER — Telehealth (INDEPENDENT_AMBULATORY_CARE_PROVIDER_SITE_OTHER): Payer: BC Managed Care – PPO | Admitting: Psychology

## 2022-01-03 DIAGNOSIS — F5089 Other specified eating disorder: Secondary | ICD-10-CM

## 2022-01-03 NOTE — Progress Notes (Signed)
  Office: [phone]  /  Fax: [phone]    Date: January 03, 2022    Appointment Start Time: 4:32pm Duration: 21 minutes Provider: Glennie Isle, Psy.D. Type of Session: Individual Therapy  Location of Patient: Parked in car (private/safe location; address obtained) Location of Provider: Provider's Home (private office) Type of Contact: Telepsychological Visit via MyChart Video Visit  Session Content: Gail Erickson is a 46 y.o. female presenting for a follow-up appointment to address the previously established treatment goal of increasing coping skills.Today's appointment was a telepsychological visit. Gail Erickson provided verbal consent for today's telepsychological appointment and she is aware she is responsible for securing confidentiality on her end of the session. Prior to proceeding with today's appointment, Gail Erickson's physical location at the time of this appointment was obtained as well a phone number she could be reached at in the event of technical difficulties. Gail Erickson and this provider participated in today's telepsychological service.   This provider conducted a brief check-in. 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 Further explored and processed; it was recommended she discuss further with her primary therapist during their appointment this Thursday. Regarding eating habits, she indicated fluctuations due to current stressors. She was engaged in problem solving to help her eat congruent to her prescribed structured meal plan. Gail Erickson agreed to implement the following: double recipes when possible; purchase frozen vegetables to steam in microwave; continue making hard boiled eggs in bulk; leave snacks/foods congruent to structured meal plan at work (e.g., frozen meals, tuna packets, fruit, yogurt); consult handout if eating out for options; and develop a Freight forwarder and/or order groceries online. Notably,  she shared she continues to engage in physical activity. Positive reinforcement was provided. Overall, Gail Erickson was receptive to today's appointment as evidenced by openness to sharing, responsiveness to feedback, and willingness to implement discussed strategies .  Mental Status Examination:  Appearance: neat Behavior: appropriate to circumstances Mood: neutral Affect: mood congruent Speech: WNL Eye Contact: appropriate Psychomotor Activity: WNL Gait: unable to assess Thought Process: linear, logical, and goal directed and no evidence or endorsement of suicidal, homicidal, and self-harm ideation, plan and intent  Thought Content/Perception: no hallucinations, delusions, bizarre thinking or behavior endorsed or observed Orientation: AAOx4 Memory/Concentration: memory, attention, language, and fund of knowledge intact  Insight: fair Judgment: fair  Interventions:  Conducted a brief chart review Provided empathic reflections and validation Employed supportive psychotherapy interventions to facilitate reduced distress and to improve coping skills with identified stressors Engaged patient in problem solving  DSM-5 Diagnosis(es): F50.89 Other Specified Feeding or Eating Disorder, Emotional Eating Behaviors  Treatment Goal & Progress: During the initial appointment with this provider, the following treatment goal was established: increase coping skills. Gail Erickson has demonstrated progress in her goal as evidenced by increased awareness of hunger patterns and increased awareness of triggers for emotional eating behaviors. Gail Erickson also continues to demonstrate willingness to engage in learned skill(s).  Plan: The next appointment is scheduled for 01/16/2022 at 4:30pm, which will be via MyChart Video Visit. The next session will focus on working towards the established treatment goal. Gail Erickson will continue meeting with her primary therapist.

## 2022-01-16 ENCOUNTER — Telehealth (INDEPENDENT_AMBULATORY_CARE_PROVIDER_SITE_OTHER): Payer: BC Managed Care – PPO | Admitting: Psychology

## 2022-01-16 ENCOUNTER — Telehealth (INDEPENDENT_AMBULATORY_CARE_PROVIDER_SITE_OTHER): Payer: Self-pay | Admitting: Psychology

## 2022-01-16 NOTE — Telephone Encounter (Signed)
  Office: [phone]  /  Fax: [phone]  Date of Call: January 16, 2022  Time of Call: 4:35pm Provider: Glennie Isle, PsyD  CONTENT: This provider called Pamla to check-in as she did not present for today's MyChart Video Visit appointment at 4:35pm. A HIPAA compliant voicemail requesting a call back could not be left as her mailbox was full.   PLAN: This provider will wait for Sehaj to call back. No further follow-up planned by this provider.

## 2022-01-16 NOTE — Progress Notes (Unsigned)
  Office: [phone]  /  Fax: [phone]    Date: January 16, 2022 e   Appointment Start Time: *** Duration: *** minutes Provider: Glennie Isle, Psy.D. Type of Session: Individual Therapy  Location of Patient: {gbptloc:23249} (private location) Location of Provider: Provider's Home (private office) Type of Contact: Telepsychological Visit via MyChart Video Visit  Session Content:This provider called [name] at 4:35pm as she did not present for today's appointment. *** As such, today's appointment was initiated *** minutes late. Gail Erickson is a 46 y.o. female presenting for a follow-up appointment to address the previously established treatment goal of increasing coping skills.Today's appointment was a telepsychological visit. [name] provided verbal consent for today's telepsychological appointment and she is aware she is responsible for securing confidentiality on her end of the session. Prior to proceeding with today's appointment, Coila's physical location at the time of this appointment was obtained as well a phone number she could be reached at in the event of technical difficulties. [name] and this provider participated in today's telepsychological service.   This provider conducted a brief check-in. *** [name] was receptive to today's appointment as evidenced by openness to sharing, responsiveness to feedback, and {gbreceptiveness:23401}.  Mental Status Examination:  Appearance: {Appearance:22431} Behavior: {Behavior:22445} Mood: {gbmood:21757} Affect: {Affect:22436} Speech: {Speech:22432} Eye Contact: {Eye Contact:22433} Psychomotor Activity: {Motor Activity:22434} Gait: {gbgait:23404} Thought Process: {thought process:22448}  Thought Content/Perception: {disturbances:22451} Orientation: {Orientation:22437} Memory/Concentration: {gbcognition:22449} Insight: {Insight:22446} Judgment: {Insight:22446}  Interventions:  {Interventions for Progress Notes:23405}  DSM-5 Diagnosis(es):  {Diagnoses:22752}  Treatment Goal & Progress: During the initial appointment with this provider, the following treatment goal was established: increase coping skills. [name] has demonstrated progress in her goal as evidenced by {gbtxprogress:22839}. [name] also {gbtxprogress2:22951}.  Plan: The next appointment is scheduled for *** at ***, which will be via MyChart Video Visit. The next session will focus on {Plan for Next Appointment:23400}.

## 2022-01-17 ENCOUNTER — Ambulatory Visit: Payer: BC Managed Care – PPO | Admitting: Bariatrics

## 2022-01-18 ENCOUNTER — Other Ambulatory Visit: Payer: Self-pay | Admitting: Hematology

## 2022-01-18 DIAGNOSIS — C50811 Malignant neoplasm of overlapping sites of right female breast: Secondary | ICD-10-CM

## 2022-01-18 DIAGNOSIS — F4323 Adjustment disorder with mixed anxiety and depressed mood: Secondary | ICD-10-CM | POA: Diagnosis not present

## 2022-01-23 ENCOUNTER — Ambulatory Visit: Payer: BC Managed Care – PPO | Admitting: Family Medicine

## 2022-01-23 ENCOUNTER — Encounter: Payer: Self-pay | Admitting: Family Medicine

## 2022-01-24 ENCOUNTER — Other Ambulatory Visit: Payer: Self-pay | Admitting: Bariatrics

## 2022-01-24 DIAGNOSIS — E559 Vitamin D deficiency, unspecified: Secondary | ICD-10-CM

## 2022-01-26 ENCOUNTER — Ambulatory Visit: Payer: 59 | Admitting: Bariatrics

## 2022-01-26 ENCOUNTER — Encounter: Payer: Self-pay | Admitting: Bariatrics

## 2022-01-26 VITALS — BP 132/80 | HR 66 | Temp 98.0°F | Ht 68.0 in | Wt 241.0 lb

## 2022-01-26 DIAGNOSIS — E669 Obesity, unspecified: Secondary | ICD-10-CM | POA: Diagnosis not present

## 2022-01-26 DIAGNOSIS — Z6836 Body mass index (BMI) 36.0-36.9, adult: Secondary | ICD-10-CM

## 2022-01-26 DIAGNOSIS — R632 Polyphagia: Secondary | ICD-10-CM | POA: Diagnosis not present

## 2022-01-26 DIAGNOSIS — E559 Vitamin D deficiency, unspecified: Secondary | ICD-10-CM | POA: Diagnosis not present

## 2022-01-26 DIAGNOSIS — R7303 Prediabetes: Secondary | ICD-10-CM | POA: Diagnosis not present

## 2022-01-26 MED ORDER — VITAMIN D (ERGOCALCIFEROL) 1.25 MG (50000 UNIT) PO CAPS: 50000.0000 [IU] | ORAL_CAPSULE | ORAL | 0 refills | Status: DC

## 2022-02-02 DIAGNOSIS — F4323 Adjustment disorder with mixed anxiety and depressed mood: Secondary | ICD-10-CM | POA: Diagnosis not present

## 2022-02-06 ENCOUNTER — Ambulatory Visit: Payer: BC Managed Care – PPO

## 2022-02-06 ENCOUNTER — Ambulatory Visit
Admission: RE | Admit: 2022-02-06 | Discharge: 2022-02-06 | Disposition: A | Discharge status: Home / Self Care | Payer: 59 | Source: Ambulatory Visit | Attending: Hematology | Admitting: Hematology

## 2022-02-06 ENCOUNTER — Other Ambulatory Visit: Payer: Self-pay | Admitting: Hematology

## 2022-02-06 DIAGNOSIS — R928 Other abnormal and inconclusive findings on diagnostic imaging of breast: Secondary | ICD-10-CM

## 2022-02-07 ENCOUNTER — Ambulatory Visit: Payer: BC Managed Care – PPO | Admitting: Podiatry

## 2022-02-07 ENCOUNTER — Other Ambulatory Visit: Payer: Self-pay | Admitting: Podiatry

## 2022-02-08 ENCOUNTER — Encounter: Payer: Self-pay | Admitting: Bariatrics

## 2022-02-08 NOTE — Progress Notes (Signed)
Chief Complaint:   OBESITY Gail Erickson is here to discuss her progress with her obesity treatment plan along with follow-up of her obesity related diagnoses. Gail Erickson is on the Category 2 Plan and states she is following her eating plan approximately 40% of the time. Gail Erickson states she is biking and walking for 30 minutes 7 times per week.  Today's visit was #: 6 Starting weight: 241 lbs Starting date: 09/14/2021 Today's weight: 241 lbs Today's date: 01/26/2022 Total lbs lost to date: 1 Total lbs lost since last in-office visit: 1  Interim History: Gail Erickson is down 1 lb since her last visit. She has been exercising. She is eating more at work.   Subjective:   1. Vitamin D deficiency Gail Erickson is taking Vitamin D as directed.   2. Prediabetes Gail Erickson is not on medications currently.   3. Polyphagia Gail Erickson had been on [facility] with no problems.   Assessment/Plan:   1. Vitamin D deficiency Gail Erickson will continue prescription Vitamin D 50,000 IU every week, and we will refill for 1 month.  - Vitamin D, Ergocalciferol, (DRISDOL) 1.25 MG (50000 UNIT) CAPS capsule; Take 1 capsule (50,000 Units total) by mouth every 7 (seven) days.  Dispense: 5 capsule; Refill: 0  2. Prediabetes Gail Erickson will continue to minimize all carbohydrates (sugars and sweets).   3. Polyphagia Handout on GLP-1 was discussed and given to the patient today. We will look at [place] and Trulicity.   4. Obesity, Current BMI 36.6 Gail Erickson is currently in the action stage of change. As such, her goal is to continue with weight loss efforts. She has agreed to the Category 2 Plan.   Meal planning and intentional eating were discussed. She is to increase her protein to 100 grams daily. Fiber sheet was given.   Exercise goals: As is.   Behavioral modification strategies: increasing lean protein intake, decreasing simple carbohydrates, increasing vegetables, increasing water intake, decreasing eating out, no skipping meals, meal planning  and cooking strategies, keeping healthy foods in the home, and planning for success.  Gail Erickson has agreed to follow-up with our clinic in 3 to 4 weeks. She was informed of the importance of frequent follow-up visits to maximize her success with intensive lifestyle modifications for her multiple health conditions.   Objective:   Blood pressure 132/80, pulse 66, temperature 98 F (36.7 C), height '5\' 8"'$  (1.727 m), weight 241 lb (109.3 kg), SpO2 98 %. Body mass index is 36.64 kg/m.  General: Cooperative, alert, well developed, in no acute distress. HEENT: Conjunctivae and lids unremarkable. Cardiovascular: Regular rhythm.  Lungs: Normal work of breathing. Neurologic: No focal deficits.   Lab Results  Component Value Date   CREATININE 0.88 11/24/2021   BUN 10 11/24/2021   NA 139 11/24/2021   K 3.6 11/24/2021   CL 109 11/24/2021   CO2 25 11/24/2021   Lab Results  Component Value Date   ALT 15 11/24/2021   AST 21 11/24/2021   ALKPHOS 41 11/24/2021   BILITOT 0.6 11/24/2021   Lab Results  Component Value Date   HGBA1C 5.5 09/14/2021   HGBA1C 5.4 06/13/2017   HGBA1C 5.1 07/01/2014   Lab Results  Component Value Date   INSULIN 17.5 09/14/2021   Lab Results  Component Value Date   TSH 1.120 09/14/2021   Lab Results  Component Value Date   CHOL 179 09/14/2021   HDL 46 09/14/2021   LDLCALC 119 (H) 09/14/2021   TRIG 77 09/14/2021   CHOLHDL 3.8 07/28/2021  Lab Results  Component Value Date   VD25OH 27.3 (L) 09/14/2021   VD25OH 23 (L) 07/28/2021   VD25OH 32.8 06/02/2019   Lab Results  Component Value Date   WBC 3.9 (L) 11/24/2021   HGB 11.9 (L) 11/24/2021   HCT 34.6 (L) 11/24/2021   MCV 84.8 11/24/2021   PLT 274 11/24/2021   Lab Results  Component Value Date   IRON 67 09/06/2021   TIBC 309 09/06/2021   FERRITIN 77 09/06/2021   Attestation Statements:   Reviewed by clinician on day of visit: allergies, medications, problem list, medical history, surgical  history, family history, social history, and previous encounter notes.   Wilhemena Durie, am acting as Location manager for [employer], DO.  I have reviewed the above documentation for accuracy and completeness, and I agree with the above. Jearld Lesch, DO

## 2022-02-09 ENCOUNTER — Ambulatory Visit: Payer: 59 | Admitting: Podiatry

## 2022-02-09 DIAGNOSIS — B351 Tinea unguium: Secondary | ICD-10-CM | POA: Diagnosis not present

## 2022-02-09 NOTE — Progress Notes (Signed)
  Subjective:  Patient ID: Gail Erickson, female    DOB: July 07, 1975,  MRN: [id number]  Chief Complaint  Patient presents with   Nail Problem    Thick painful toenails, 3 month follow up    46 y.o. female presents with the above complaint. History confirmed with patient.  She completed the call did not notice any significant side effects  Objective:  Physical Exam: warm, good capillary refill, no trophic changes or ulcerative lesions, normal DP and PT pulses, normal sensory exam, and onychomycosis of all 5 toenails of the right foot with significant dystrophic growth and thickening and yellow discoloration with subungual debris I do see approximately 20 to 30% clearing proximally especially in the right hallux    Assessment:   1. Onychomycosis      Plan:  Patient was evaluated and treated and all questions answered.  Starting to see some improve after her Lamisil therapy.  I discussed with her that I would recommend we continue antifungal therapy.  We discussed that further Lamisil therapy could start to have potentiation issues and reduction of efficacy of her tamoxifen.  I would not want to create an issue with this and we will discuss this with her oncologist.  Alternatively we discussed the option of a pulsed dosing fluconazole once per week which hopefully should not have his [place] risk of this.  If this is not feasible then we will plan for Jublia topical therapy    Return in about 6 months (around 08/10/2022) for follow up after nail fungus treatment.

## 2022-02-14 ENCOUNTER — Telehealth (INDEPENDENT_AMBULATORY_CARE_PROVIDER_SITE_OTHER): Payer: 59 | Admitting: Psychology

## 2022-02-14 ENCOUNTER — Ambulatory Visit: Payer: BC Managed Care – PPO | Admitting: Bariatrics

## 2022-02-14 DIAGNOSIS — F5089 Other specified eating disorder: Secondary | ICD-10-CM

## 2022-02-14 NOTE — Progress Notes (Signed)
  Office: [phone]  /  Fax: [phone]    Date: February 14, 2022    Appointment Start Time: 4:32pm Duration: 24 minutes Provider: Glennie Isle, Psy.D. Type of Session: Individual Therapy  Location of Patient: Work (private location) Location of Provider: Provider's Home (private office) Type of Contact: Telepsychological Visit via MyChart Video Visit  Session Content: Gail Erickson is a 46 y.o. female presenting for a follow-up appointment to address the previously established treatment goal of increasing coping skills.Today's appointment was a telepsychological visit. Gail Erickson provided verbal consent for today's telepsychological appointment and she is aware she is responsible for securing confidentiality on her end of the session. Prior to proceeding with today's appointment, Gail Erickson's physical location at the time of this appointment was obtained as well a phone number she could be reached at in the event of technical difficulties. Nazirah and this provider participated in today's telepsychological service.   This provider conducted a brief check-in. Gail Erickson discussed experiencing frustration recently due to weight loss to date, adding medication was considered. She described feeling "weary" about a new medication due to her medical history. Further explored and processed. Regarding her eating habits, she acknowledged it is has "been all over the place." Gail Erickson acknowledged stress from different aspects of her life has impacted her eating habits. She also described a lack of engagement in self-care. Thus, psychoeducation regarding the importance of self-care utilizing the oxygen mask metaphor was provided. Psychoeducation regarding pleasurable activities, including its impact on emotional eating and overall well-being was also provided. Gail Erickson was provided with a handout with various options of pleasurable activities, and was encouraged to engage in one activity a day and additional activities as needed when  triggered to emotionally eat. Gail Erickson agreed. Gail Erickson provided verbal consent during today's appointment for this provider to send a handout with pleasurable activities via e-mail. Overall, Gail Erickson was receptive to today's appointment as evidenced by openness to sharing, responsiveness to feedback, and willingness to engage in pleasurable activities to assist with coping.  Mental Status Examination:  Appearance: neat Behavior: appropriate to circumstances Mood: neutral Affect: mood congruent Speech: WNL Eye Contact: appropriate Psychomotor Activity: WNL Gait: unable to assess Thought Process: linear, logical, and goal directed and no evidence or endorsement of suicidal, homicidal, and self-harm ideation, plan and intent  Thought Content/Perception: no hallucinations, delusions, bizarre thinking or behavior endorsed or observed Orientation: AAOx4 Memory/Concentration: memory, attention, language, and fund of knowledge intact  Insight: fair Judgment: fair  Interventions:  Conducted a brief chart review Provided empathic reflections and validation Provided positive reinforcement Employed supportive psychotherapy interventions to facilitate reduced distress and to improve coping skills with identified stressors Psychoeducation provided regarding pleasurable activities Psychoeducation provided regarding self-care  DSM-5 Diagnosis(es): F50.89 Other Specified Feeding or Eating Disorder, Emotional Eating Behaviors  Treatment Goal & Progress: During the initial appointment with this provider, the following treatment goal was established: increase coping skills. Gail Erickson has demonstrated progress in her goal as evidenced by increased awareness of hunger patterns and increased awareness of triggers for emotional eating behaviors. Gail Erickson also continues to demonstrate willingness to engage in learned skill(s).  Plan: Based on Bryann's work schedule and appointment availability, the next appointment is  scheduled for 03/07/2022 at 4pm, which will be via [place] Visit. The next session will focus on working towards the established treatment goal. Gail Erickson will continue with her primary therapist.

## 2022-02-16 DIAGNOSIS — F4323 Adjustment disorder with mixed anxiety and depressed mood: Secondary | ICD-10-CM | POA: Diagnosis not present

## 2022-02-21 ENCOUNTER — Encounter: Payer: Self-pay | Admitting: Bariatrics

## 2022-02-21 ENCOUNTER — Ambulatory Visit (INDEPENDENT_AMBULATORY_CARE_PROVIDER_SITE_OTHER): Payer: 59 | Admitting: Bariatrics

## 2022-02-21 VITALS — BP 128/77 | HR 81 | Ht 68.0 in | Wt 241.0 lb

## 2022-02-21 DIAGNOSIS — E782 Mixed hyperlipidemia: Secondary | ICD-10-CM | POA: Diagnosis not present

## 2022-02-21 DIAGNOSIS — R632 Polyphagia: Secondary | ICD-10-CM

## 2022-02-21 DIAGNOSIS — R7303 Prediabetes: Secondary | ICD-10-CM

## 2022-02-21 DIAGNOSIS — E669 Obesity, unspecified: Secondary | ICD-10-CM

## 2022-02-21 DIAGNOSIS — E559 Vitamin D deficiency, unspecified: Secondary | ICD-10-CM

## 2022-02-21 DIAGNOSIS — Z6836 Body mass index (BMI) 36.0-36.9, adult: Secondary | ICD-10-CM

## 2022-02-21 MED ORDER — ZEPBOUND 2.5 MG/0.5ML ~~LOC~~ SOAJ: 2.5000 mg | SUBCUTANEOUS | 0 refills | Status: DC

## 2022-02-21 MED ORDER — VITAMIN D (ERGOCALCIFEROL) 1.25 MG (50000 UNIT) PO CAPS: 50000.0000 [IU] | ORAL_CAPSULE | ORAL | 0 refills | Status: DC

## 2022-02-22 ENCOUNTER — Telehealth: Payer: Self-pay

## 2022-02-22 NOTE — Telephone Encounter (Signed)
Left message for patient to return call recall regarding medication alternative.

## 2022-02-23 ENCOUNTER — Other Ambulatory Visit: Payer: Self-pay | Admitting: Bariatrics

## 2022-02-23 DIAGNOSIS — E559 Vitamin D deficiency, unspecified: Secondary | ICD-10-CM

## 2022-02-28 ENCOUNTER — Telehealth (INDEPENDENT_AMBULATORY_CARE_PROVIDER_SITE_OTHER): Payer: Self-pay | Admitting: Bariatrics

## 2022-02-28 ENCOUNTER — Other Ambulatory Visit (INDEPENDENT_AMBULATORY_CARE_PROVIDER_SITE_OTHER): Payer: Self-pay | Admitting: Bariatrics

## 2022-02-28 NOTE — Telephone Encounter (Signed)
Spoke to patient and notified her per insurance that she will have to take some alternatives ( orlistat, QSYMIA, SAXENDA, WEGOVY). Also stated to the patient that per Dr. Owens Shark, she recommends [place] or [facility]. Patient stated she would like to try [facility]

## 2022-02-28 NOTE — Telephone Encounter (Signed)
Patient is calling to confirm we have receive her prior authorization for zepbound. Please call patient to confirm.

## 2022-03-02 ENCOUNTER — Other Ambulatory Visit (INDEPENDENT_AMBULATORY_CARE_PROVIDER_SITE_OTHER): Payer: Self-pay | Admitting: Bariatrics

## 2022-03-02 MED ORDER — WEGOVY 0.25 MG/0.5ML ~~LOC~~ SOAJ: 0.2500 mg | SUBCUTANEOUS | 0 refills | Status: DC

## 2022-03-02 NOTE — Telephone Encounter (Signed)
Dr. Owens Shark patient wants to try [facility]. Changed pharmacy in patient chart.

## 2022-03-07 ENCOUNTER — Telehealth (INDEPENDENT_AMBULATORY_CARE_PROVIDER_SITE_OTHER): Payer: 59 | Admitting: Psychology

## 2022-03-07 ENCOUNTER — Encounter: Payer: Self-pay | Admitting: Bariatrics

## 2022-03-07 NOTE — Progress Notes (Signed)
Chief Complaint:   OBESITY Gail Erickson is here to discuss her progress with her obesity treatment plan along with follow-up of her obesity related diagnoses. Gail Erickson is on the Category 2 Plan and states she is following her eating plan approximately 40% of the time. Gail Erickson states she is walking and doing cubi for 30 minutes 7 times per week.  Today's visit was #: 7 Starting weight: 241 lbs Starting date: 09/14/2021 Today's weight: 241 lbs Today's date: 02/21/2022 Total lbs lost to date: 0 Total lbs lost since last in-office visit: 0  Interim History: Gail Erickson wants to discuss a new medication for weight loss.  Her weight remains the same.  She had tried [employer].  Her insurance has a weight loss component per the patient.  Her goal is below 200 pounds.  Subjective:   1. Vitamin D deficiency Gail Erickson is taking prescription vitamin D as directed.  2. Mixed hyperlipidemia Gail Erickson is currently taking omega-3.  3. Polyphagia Gail Erickson has tried [place] in the past, but she had to stop due to insurance.  She is not on medications currently.  4. Prediabetes Gail Erickson is not on medications currently.  Assessment/Plan:   1. Vitamin D deficiency We will refill prescription vitamin D 50,000 IU every week for 1 month.  - Vitamin D, Ergocalciferol, (DRISDOL) 1.25 MG (50000 UNIT) CAPS capsule; Take 1 capsule (50,000 Units total) by mouth every 7 (seven) days.  Dispense: 5 capsule; Refill: 0  2. Mixed hyperlipidemia [name] will continue omega-3.  Handout was given on healthy versus unhealthy fats.  3. Polyphagia [name] will work on increasing her water, protein, and fiber intake.  GLP-1 handout was given.  4. Prediabetes Gail Erickson agreed to start Zepbound 2.5 mg once weekly, with no refills.  We will follow-up at her next visit.  5. Obesity, Current BMI 36.7 Gail Erickson is currently in the action stage of change. As such, her goal is to continue with weight loss efforts. She has agreed to the Category 2 Plan.    Meal planning and intentional eating were discussed.  She will increase her protein and fiber.  Exercise goals: As is.   Behavioral modification strategies: increasing lean protein intake, decreasing simple carbohydrates, increasing vegetables, increasing water intake, increasing high fiber foods, decreasing eating out, no skipping meals, meal planning and cooking strategies, keeping healthy foods in the home, and planning for success.  Gail Erickson has agreed to follow-up with our clinic in 4 weeks. She was informed of the importance of frequent follow-up visits to maximize her success with intensive lifestyle modifications for her multiple health conditions.   Objective:   Blood pressure 128/77, pulse 81, height '5\' 8"'$  (1.727 m), weight 241 lb (109.3 kg), SpO2 99 %. Body mass index is 36.64 kg/m.  General: Cooperative, alert, well developed, in no acute distress. HEENT: Conjunctivae and lids unremarkable. Cardiovascular: Regular rhythm.  Lungs: Normal work of breathing. Neurologic: No focal deficits.   Lab Results  Component Value Date   CREATININE 0.88 11/24/2021   BUN 10 11/24/2021   NA 139 11/24/2021   K 3.6 11/24/2021   CL 109 11/24/2021   CO2 25 11/24/2021   Lab Results  Component Value Date   ALT 15 11/24/2021   AST 21 11/24/2021   ALKPHOS 41 11/24/2021   BILITOT 0.6 11/24/2021   Lab Results  Component Value Date   HGBA1C 5.5 09/14/2021   HGBA1C 5.4 06/13/2017   HGBA1C 5.1 07/01/2014   Lab Results  Component Value Date   INSULIN 17.5  09/14/2021   Lab Results  Component Value Date   TSH 1.120 09/14/2021   Lab Results  Component Value Date   CHOL 179 09/14/2021   HDL 46 09/14/2021   LDLCALC 119 (H) 09/14/2021   TRIG 77 09/14/2021   CHOLHDL 3.8 07/28/2021   Lab Results  Component Value Date   VD25OH 27.3 (L) 09/14/2021   VD25OH 23 (L) 07/28/2021   VD25OH 32.8 06/02/2019   Lab Results  Component Value Date   WBC 3.9 (L) 11/24/2021   HGB 11.9 (L)  11/24/2021   HCT 34.6 (L) 11/24/2021   MCV 84.8 11/24/2021   PLT 274 11/24/2021   Lab Results  Component Value Date   IRON 67 09/06/2021   TIBC 309 09/06/2021   FERRITIN 77 09/06/2021   Attestation Statements:   Reviewed by clinician on day of visit: allergies, medications, problem list, medical history, surgical history, family history, social history, and previous encounter notes.   Gail Erickson, am acting as Location manager for [employer], DO.  I have reviewed the above documentation for accuracy and completeness, and I agree with the above. Gail Lesch, DO

## 2022-03-08 ENCOUNTER — Encounter: Payer: Self-pay | Admitting: Podiatry

## 2022-03-08 NOTE — Addendum Note (Signed)
Addended bySherryle Lis, Lorelei Heikkila R on: 03/08/2022 05:15 PM   Modules accepted: Orders

## 2022-03-09 ENCOUNTER — Ambulatory Visit: Payer: BC Managed Care – PPO | Admitting: Hematology

## 2022-03-09 ENCOUNTER — Other Ambulatory Visit: Payer: BC Managed Care – PPO

## 2022-03-09 ENCOUNTER — Ambulatory Visit: Payer: Self-pay | Admitting: Physician Assistant

## 2022-03-15 ENCOUNTER — Telehealth: Payer: Self-pay

## 2022-03-15 MED ORDER — FLUCONAZOLE 150 MG PO TABS: 150.0000 mg | ORAL_TABLET | ORAL | 0 refills | Status: DC

## 2022-03-15 NOTE — Telephone Encounter (Signed)
Spoke with Gail Erickson via telephone regarding voicemail about Covid.  Gail Erickson stated she tested (+) for Covid on 03/08/2023.  Gail Erickson stated she is negative for fever and other symptoms.  Per hospital policy informed Gail Erickson that she is "OK" to come in for her f/u appt with [name], NP on 03/16/2022.  Instructed Gail Erickson to double mask for her appt.  Gail Erickson verbalized understanding of instructions.

## 2022-03-16 ENCOUNTER — Inpatient Hospital Stay (HOSPITAL_BASED_OUTPATIENT_CLINIC_OR_DEPARTMENT_OTHER): Payer: 59 | Admitting: Nurse Practitioner

## 2022-03-16 ENCOUNTER — Ambulatory Visit: Payer: Self-pay | Admitting: Physician Assistant

## 2022-03-16 ENCOUNTER — Other Ambulatory Visit: Payer: Self-pay

## 2022-03-16 ENCOUNTER — Inpatient Hospital Stay: Payer: 59 | Attending: Nurse Practitioner

## 2022-03-16 ENCOUNTER — Encounter: Payer: Self-pay | Admitting: Nurse Practitioner

## 2022-03-16 VITALS — BP 134/80 | HR 68 | Temp 97.9°F | Resp 18 | Ht 68.0 in | Wt 245.3 lb

## 2022-03-16 DIAGNOSIS — D649 Anemia, unspecified: Secondary | ICD-10-CM | POA: Diagnosis not present

## 2022-03-16 DIAGNOSIS — C50811 Malignant neoplasm of overlapping sites of right female breast: Secondary | ICD-10-CM | POA: Insufficient documentation

## 2022-03-16 DIAGNOSIS — Z79810 Long term (current) use of selective estrogen receptor modulators (SERMs): Secondary | ICD-10-CM | POA: Diagnosis not present

## 2022-03-16 DIAGNOSIS — Z170 Estrogen receptor positive status [ER+]: Secondary | ICD-10-CM | POA: Diagnosis not present

## 2022-03-16 LAB — CMP (CANCER CENTER ONLY)
ALT: 16 U/L (ref 0–44)
AST: 21 U/L (ref 15–41)
Albumin: 4.2 g/dL (ref 3.5–5.0)
Alkaline Phosphatase: 40 U/L (ref 38–126)
Anion gap: 5 (ref 5–15)
BUN: 10 mg/dL (ref 6–20)
CO2: 28 mmol/L (ref 22–32)
Calcium: 9.5 mg/dL (ref 8.9–10.3)
Chloride: 109 mmol/L (ref 98–111)
Creatinine: 0.98 mg/dL (ref 0.44–1.00)
GFR, Estimated: >60 mL/min (ref >60)
Glucose, Bld: 81 mg/dL (ref 70–99)
Potassium: 3.9 mmol/L (ref 3.5–5.1)
Sodium: 142 mmol/L (ref 135–145)
Total Bilirubin: 0.7 mg/dL (ref 0.3–1.2)
Total Protein: 6.9 g/dL (ref 6.5–8.1)

## 2022-03-16 LAB — CBC WITH DIFFERENTIAL (CANCER CENTER ONLY)
Abs Immature Granulocytes: 0.01 10*3/uL (ref 0.00–0.07)
Basophils Absolute: 0 10*3/uL (ref 0.0–0.1)
Basophils Relative: 1 %
Eosinophils Absolute: 0.1 10*3/uL (ref 0.0–0.5)
Eosinophils Relative: 3 %
HCT: 34.1 % — ABNORMAL LOW (ref 36.0–46.0)
Hemoglobin: 11.9 g/dL — ABNORMAL LOW (ref 12.0–15.0)
Immature Granulocytes: 0 %
Lymphocytes Relative: 47 %
Lymphs Abs: 1.9 10*3/uL (ref 0.7–4.0)
MCH: 29.2 pg (ref 26.0–34.0)
MCHC: 34.9 g/dL (ref 30.0–36.0)
MCV: 83.8 fL (ref 80.0–100.0)
Monocytes Absolute: 0.4 10*3/uL (ref 0.1–1.0)
Monocytes Relative: 9 %
Neutro Abs: 1.7 10*3/uL (ref 1.7–7.7)
Neutrophils Relative %: 40 %
Platelet Count: 256 10*3/uL (ref 150–400)
RBC: 4.07 MIL/uL (ref 3.87–5.11)
RDW: 13.9 % (ref 11.5–15.5)
WBC Count: 4.2 10*3/uL (ref 4.0–10.5)
nRBC: 0 % (ref 0.0–0.2)

## 2022-03-16 NOTE — Progress Notes (Signed)
Patient Care Team: Alvira Monday, [place] as PCP - General (Family Medicine) Yisroel Ramming, Everardo All, MD as Consulting Physician (Obstetrics and Gynecology) [name], MD as Consulting Physician (General Surgery) [name], MD as Consulting Physician (Hematology) [name], RN as Oncology Nurse Navigator [name], RN as Oncology Nurse Navigator Alla Feeling, NP as Nurse Practitioner (Nurse Practitioner) Charlotte Sanes as Consulting Physician (Endocrinology)   CHIEF COMPLAINT: Follow-up right breast cancer  Oncology History Overview Note  Cancer Staging Malignant neoplasm of overlapping sites of right breast [facility]) Staging form: Breast, AJCC 8th Edition - Clinical stage from 02/25/2020: Stage IIB (cT3, cN0, cM0, G3, ER+, PR+, HER2-) - Signed by [name], MD on 02/25/2020 - Pathologic stage from 04/28/2020: Stage IA (pT1a, pN0, cM0, G2, ER+, PR+, HER2-) - Signed by [name], MD on 05/06/2020 Histologic grading system: 3 grade system    Malignant neoplasm of overlapping sites of right breast ([place])  01/30/2020 Mammogram   Mammogram 01/30/20  IMPRESSION: 1. 7.3 x 5.7 x 3.0 cm area of indeterminate calcifications in medial right breast, involving the lower inner and upper inner quadrants, these extend from the retroareolar region posteriorly, in a segmental distribution, suspicious for DCIS. 2. Two probably benign left breast masses and 2 probably benign left axillary lymph nodes with borderline cortical thickening. 3. Benign right breast cyst.   02/13/2020 Initial Biopsy   Diagnosis 02/13/20 1. Breast, right, needle core biopsy, posterior extent - DUCTAL CARCINOMA IN SITU WITH CALCIFICATIONS AND NECROSIS - SEE COMMENT 2. Breast, right, needle core biopsy, mid-anterior extent - INVASIVE DUCTAL CARCINOMA - DUCTAL CARCINOMA IN SITU - SEE COMMENT Microscopic Comment 1. Based on the biopsy, the ductal carcinoma in situ has a solid pattern, high nuclear  grade and measures 0.5 cm in greatest linear extent. Prognostic markers (ER/PR) are pending and will be reported in an addendum. Dr. Jeannie Done reviewed the case and agrees with the above diagnosis. These results were called to [facility] on February 16, 2020. 2. Based on the biopsy, the carcinoma appears Nottingham grade 3 of 3 and measures 0.15 cm in greatest linear extent. Prognostic markers (ER/PR/ki-67/HER2) are pending and will be reported in an addendum.   02/13/2020 Receptors her2    1. PROGNOSTIC INDICATORS Results: IMMUNOHISTOCHEMICAL AND MORPHOMETRIC ANALYSIS PERFORMED MANUALLY Estrogen Receptor: >95%, POSITIVE, MODERATE-STRONG STAINING INTENSITY Progesterone Receptor: 5%, POSITIVE, MODERATE-STRONG STAINING INTENSITY  2. PROGNOSTIC INDICATORS Results: IMMUNOHISTOCHEMICAL AND MORPHOMETRIC ANALYSIS PERFORMED MANUALLY The tumor cells are NEGATIVE for Her2 (0). Estrogen Receptor: >95%, POSITIVE, STRONG STAINING INTENSITY Progesterone Receptor: 60%, POSITIVE, STRONG STAINING INTENSITY Proliferation Marker Ki67: 1%   02/21/2020 Breast MRI   Breast MRI 02/21/20  IMPRESSION: 1. Involving the sites of biopsy proven DCIS in the medial right breast there is a large area of ill-defined non mass enhancement middle to anterior depth, somewhat difficult to measure due to irregular margins and background enhancement but measures at least 6.3 x 3.3 x 2.0 cm. 2. No MRI evidence of malignancy in the left breast. 3. No axillary adenopathy.   02/25/2020 Initial Diagnosis   Malignant neoplasm of overlapping sites of right breast ([place])   02/25/2020 Cancer Staging   Staging form: Breast, AJCC 8th Edition - Clinical stage from 02/25/2020: Stage IIB (cT3, cN0, cM0, G3, ER+, PR+, HER2-) - Signed by [name], MD on 02/25/2020   02/27/2020 Pathology Results   Diagnosis Lymph node, needle/core biopsy, axillary - NO CARCINOMA IDENTIFIED IN NODAL TISSUE Microscopic  Comment  These results were called to [facility] on March 09, 2020.   04/28/2020 Surgery   RIGHT MASTECTOMY WITH SENTINEL LYMPH NODE BIOPSY by DR Marlou Starks  IMMEDIATE RIGHT BREAST RECONSTRUCTION WITH PLACEMENT OF TISSUE EXPANDER AND FLEX HD (ACELLULAR HYDRATED DERMIS) by Dr Marla Roe   04/28/2020 Pathology Results   FINAL MICROSCOPIC DIAGNOSIS:   A. 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 LYMPH NODE, RIGHT AXILLARY, SENTINEL, EXCISION:  -  No carcinoma identified in one lymph node (0/1)    04/28/2020 Cancer Staging   Staging form: Breast, AJCC 8th Edition - Pathologic stage from 04/28/2020: Stage IA (pT1a, pN0, cM0, G2, ER+, PR+, HER2-) - Signed by [name], MD on 05/06/2020 Histologic grading system: 3 grade system   07/10/2020 -  Anti-estrogen oral therapy   Tamoxifen 64m once daily starting 07/10/20   11/04/2020 Survivorship   SCP delivered by LCira Rue NP      CURRENT THERAPY: Tamoxifen 20 mg once daily starting 07/10/2020  INTERVAL HISTORY Gail Erickson returns for follow-up as scheduled, last seen by me 09/06/2021.  She had slightly worsened anemia at the time, Hgb 10.8, further workup showed normal B12, folate, iron/ferritin, and TSH.  Repeat CBC 11/24/2021 showed Hgb 11.9.   Due to discordant pathology after screening breast MRI she underwent left lumpectomy by Dr. TMarlou Starkson 10/21/2021, path showed benign spindle cell proliferation, suggestive of fibromatosis, no cancer. The experience was stressful in general but she has recovered. Left mammo 02/06/22 was negative. She continues tamoxifen, tolerating well. She had more joint pain in Nov-Dec but improved with exercise. She would like to lose weight,  still going to weight loss clinic. Hot flashes are mild, intermittent, but had more with recent COVID. Other symptoms were headache and body aches but she feels well now. Still having periods lasting 4 days.   ROS  All other systems reviewed and are negative.   Past Medical History:  Diagnosis Date   Abnormal Pap smear of cervix 06/02/2014   LGSIL:neg HR HPV (1st abn. pap)   Back pain    Breast cancer ([place]   [place]cer ([place]    Cyst of right breast    --[place]  Elevated DHEA 05/21/2015   level = 343   Family history of breast cancer    High cholesterol    HSV-1 (herpes simplex virus 1) infection    oral HSV   Joint pain    Migraine without aura    PCOS (polycystic ovarian syndrome)    Prediabetes 2021   sees Dr.Balan   Swelling of lower extremity    Vitamin D deficiency 05/21/2015   level = 10     Past Surgical History:  Procedure Laterality Date   BREAST EXCISIONAL BIOPSY Left 10/21/2021   BREAST LUMPECTOMY WITH RADIOACTIVE SEED LOCALIZATION Left 10/21/2021   Procedure: LEFT BREAST RADIOACTIVE SEED LOCALIZED LUMPECTOMY;  Surgeon: TJovita Kussmaul MD;  [place]  Service: General;  Laterality: Left;   BREAST RECONSTRUCTION WITH PLACEMENT OF TISSUE EXPANDER AND FLEX HD (ACELLULAR HYDRATED DERMIS) Right 04/28/2020   Procedure: IMMEDIATE RIGHT BREAST RECONSTRUCTION WITH PLACEMENT OF TISSUE EXPANDER  AND FLEX HD (ACELLULAR HYDRATED DERMIS);  Surgeon: Wallace Going, DO;  Location: [place];  Service: Plastics;  Laterality: Right;   COLONOSCOPY WITH PROPOFOL N/A 06/07/2021   Procedure: COLONOSCOPY WITH PROPOFOL;  Surgeon: Eloise Harman, DO;  [facility];  Service: Endoscopy;  Laterality: N/A;  10:30 / ASA 2   COLPOSCOPY     LIPOSUCTION WITH LIPOFILLING Right 02/17/2021   Procedure: Lipofilling of right breast for symmetry;  Surgeon: Wallace Going, DO;  Location: [place];  Service: Plastics;  Laterality: Right;  1.5 hour   MASTECTOMY Right 04/28/2020   MASTECTOMY W/ SENTINEL NODE BIOPSY Right 04/28/2020   Procedure: RIGHT MASTECTOMY WITH SENTINEL LYMPH NODE BIOPSY;  Surgeon: [name], MD;  Location: [place];  Service: General;  Laterality: Right;  PECTORAL BLOCK   MASTOPEXY Left 09/23/2020   Procedure: LEFT BREAST MASTOPEXY;  Surgeon: Wallace Going, DO;  Location: [place];  Service: Plastics;  Laterality: Left;   POLYPECTOMY  06/07/2021   Procedure: POLYPECTOMY;  Surgeon: Eloise Harman, DO;  [facility];  Service: Endoscopy;;   REDUCTION MAMMAPLASTY Left 04/28/2020   REMOVAL OF TISSUE EXPANDER AND PLACEMENT OF IMPLANT Right 09/23/2020   Procedure: REMOVAL OF TISSUE EXPANDER AND PLACEMENT OF IMPLANT;  Surgeon: Wallace Going, DO;  Location: [place];  Service: Plastics;  Laterality: Right;  90 min   UMBILICAL HERNIA REPAIR     --as a child     Outpatient Encounter Medications as of 03/16/2022  Medication Sig   Ascorbic Acid (VITAMIN C) 1000 MG tablet Take 1,000 mg by mouth every 14 (fourteen) days.   Aspirin-Acetaminophen-Caffeine (EXCEDRIN MIGRAINE PO) Take 1 tablet by mouth daily as needed (headache).   Cetirizine HCl (ZYRTEC PO) Take by mouth.   Cholecalciferol (VITAMIN D3) 25 MCG (1000 UT) CAPS Take 1 capsule (1,000 Units total) by mouth daily.   famciclovir (FAMVIR) 500 MG tablet Take 1572m singe dose as needed for cold sore   fluconazole (DIFLUCAN) 150 MG tablet Take 1 tablet (150 mg total) by mouth once a week for 26 doses.   Multiple Vitamin (MULTIVITAMIN) tablet Take 1 tablet by mouth every 14 (fourteen) days.   Omega-3 Fatty Acids (FISH OIL) 1000 MG CAPS Take by mouth every 14 (fourteen) days.   oxyCODONE (ROXICODONE) 5 MG immediate release tablet Take 1 tablet (5 mg total) by mouth every 6 (six) hours as needed for severe pain.   Probiotic Product  (PROBIOTIC-10 PO) Take 1 tablet by mouth every 14 (fourteen) days.   Semaglutide-Weight Management (WEGOVY) 0.25 MG/0.5ML SOAJ Inject 0.25 mg into the skin once a week.   tamoxifen (NOLVADEX) 20 MG tablet TAKE 1 TABLET(20 MG) BY MOUTH DAILY   Vitamin D, Ergocalciferol, (DRISDOL) 1.25 MG (50000 UNIT) CAPS capsule Take 1 capsule (50,000 Units total) by mouth every 7 (seven) days.   [DISCONTINUED] metroNIDAZOLE (FLAGYL) 500 MG tablet Take 1 tablet (500 mg total) by mouth 2 (two) times daily.   No facility-administered encounter medications on file as of 03/16/2022.     Today's Vitals   03/16/22 1110  BP: 134/80  Pulse: 68  Resp: 18  Temp: 97.9 F (36.6 C)  TempSrc: Temporal  SpO2: 100%  Weight: 245 lb 4.8 oz (111.3 kg)  Height: _0  (1.727 m)   Body mass index is 37.3 kg/m.   PHYSICAL EXAM GENERAL:alert, no distress and comfortable SKIN: no rash  EYES: sclera  clear NECK: without mass LYMPH:  no palpable cervical or supraclavicular lymphadenopathy  LUNGS:  normal breathing effort HEART: no lower extremity edema ABDOMEN: abdomen soft, non-tender and normal bowel sounds NEURO: alert & oriented x 3 with fluent speech Breast exam: s/p R mastectomy, reconstruction, and nipple tattoo and s/p left lumpectomy. Incisions completely healed with minimal scar tissue. No palpable mass or nodularity in either breast or axilla that I could appreciate     CBC    Component Value Date/Time   WBC 4.2 03/16/2022 1049   WBC 4.5 07/26/2020 1240   RBC 4.07 03/16/2022 1049   HGB 11.9 (L) 03/16/2022 1049   HGB 11.6 06/02/2019 0902   HGB 12.1 05/21/2015 1155   HCT 34.1 (L) 03/16/2022 1049   HCT 33.1 (L) 09/06/2021 0940   PLT 256 03/16/2022 1049   PLT 314 06/02/2019 0902   MCV 83.8 03/16/2022 1049   MCV 87 06/02/2019 0902   MCH 29.2 03/16/2022 1049   MCHC 34.9 03/16/2022 1049   RDW 13.9 03/16/2022 1049   RDW 13.8 06/02/2019 0902   LYMPHSABS 1.9 03/16/2022 1049   MONOABS 0.4 03/16/2022 1049    EOSABS 0.1 03/16/2022 1049   BASOSABS 0.0 03/16/2022 1049     CMP     Component Value Date/Time   NA 142 03/16/2022 1049   NA 139 06/02/2019 0902   K 3.9 03/16/2022 1049   CL 109 03/16/2022 1049   CO2 28 03/16/2022 1049   GLUCOSE 81 03/16/2022 1049   BUN 10 03/16/2022 1049   BUN 6 06/02/2019 0902   CREATININE 0.98 03/16/2022 1049   CREATININE 0.85 07/26/2020 1240   CALCIUM 9.5 03/16/2022 1049   PROT 6.9 03/16/2022 1049   PROT 7.1 06/02/2019 0902   ALBUMIN 4.2 03/16/2022 1049   ALBUMIN 4.5 06/02/2019 0902   AST 21 03/16/2022 1049   ALT 16 03/16/2022 1049   ALKPHOS 40 03/16/2022 1049   BILITOT 0.7 03/16/2022 1049   GFRNONAA >60 03/16/2022 1049   GFRAA 81 06/02/2019 0902     ASSESSMENT & PLAN:Gail Erickson is a 47 y.o. female with    1. 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  -She began tamoxifen 66m once daily on 07/10/20.  -08/2021 screening breast MRI showed new left breast mass at 5:00 measuring up to 9 mm, path showed benign breast tissue with focally increased stromal fibrosis and fat of the left breast. Path was found to be discordant. -s/p left lumpectomy 10/21/21 by Dr. TMarlou Starks path showed fibromatosis, negative for malignancy. She recovered -left mammo 01/2022 was negative -Ms. Coon is clinically doing well, tolerating tamoxifen with stable tolerable joint pain and hot flash.  -breast exam is benign, labs are stable. Overall no clinical concern for breast cancer recurrence.  -Continue surveillance and high risk screening program (annual left mammo and breast MRI staggered 6 months apart), and tamoxifen -F/up in 6 months, or sooner if needed  2. Mild anemia -She has had mild normocytic  anemia Hgb 11.4 - 11.8 since 2021 -08/2021 hgb 10.8, further w/up with Iron, B12, folate, and TSH were normal -Colonoscopy 05/2021 showed no bleeding -She takes womens multivitamin when she remembers -Hgb 11.9 today -If she has worsening anemia in the future, I will ask GI to consider  EGD; currently no UGI symptoms   3. Family history, Genetic testing negative for BRCA 1/2 -She has an extensive family history of cancer, mainly breast cancer on both sides of her family.  -Genetic testing was negative   4.  Health maintenance, pre-- DM, stress -She is seen by Endocrinologist and healthy weight loss clinic -she recently started a new job and has health insurance -She has periodic levels of high stress. She is between therapists and would like to speak with SW for resources     PLAN: -Lumpectomy path, recent mammogram, and today's labs reviewed -continue breast cancer surveillance and tamoxifen -Screening breast MRI due 07/2022, then L mammo 01/2023 -Will ask SW to call pt per her request, would like help finding new therapist    Orders Placed This Encounter  Procedures   MR BREAST BILATERAL W [place] CAD    Standing Status:   Future    Standing Expiration Date:   03/17/2023    Order Specific Question:   If indicated for the ordered procedure, I authorize the administration of contrast media per Radiology protocol    Answer:   Yes    Order Specific Question:   What is the patient's sedation requirement?    Answer:   No Sedation    Order Specific Question:   Does the patient have a pacemaker or implanted devices?    Answer:   No    Order Specific Question:   Preferred imaging location?    Answer:   [facility] (table limit-550lbs)      All questions were answered. The patient knows to call the clinic with any problems, questions or concerns. No barriers to learning were detected.    [name], NP-C 03/16/2022

## 2022-03-20 ENCOUNTER — Telehealth (INDEPENDENT_AMBULATORY_CARE_PROVIDER_SITE_OTHER): Payer: 59 | Admitting: Psychology

## 2022-03-20 DIAGNOSIS — F5089 Other specified eating disorder: Secondary | ICD-10-CM | POA: Diagnosis not present

## 2022-03-20 NOTE — Progress Notes (Signed)
  Office: [phone]  /  Fax: [phone]    Date: March 20, 2022    Appointment Start Time: 4:01pm Duration: 33 minutes Provider: Glennie Isle, Psy.D. Type of Session: Individual Therapy  Location of Patient: Work (private location) Location of Provider: Provider's Home (private office) Type of Contact: Telepsychological Visit via MyChart Video Visit  Session Content: Gail Erickson is a 47 y.o. female presenting for a follow-up appointment to address the previously established treatment goal of increasing coping skills.Today's appointment was a telepsychological visit. [name] provided verbal consent for today's telepsychological appointment and she is aware she is responsible for securing confidentiality on her end of the session. Prior to proceeding with today's appointment, [name]'s physical location at the time of this appointment was obtained as well a phone number she could be reached at in the event of technical difficulties. [name] and this provider participated in today's telepsychological service.   This provider conducted a brief check-in. [name] stated she had COVID-19 during Christmas, noting she is "okay" now. She also shared about her appointment with Dr. Owens Shark. Regarding her eating habits, she noted some reduction in emotional eating behaviors, but acknowledged stress continues to be a trigger. To further assist with coping, she was introduced to the S.W,A.P. technique (i.e., instead of emotional eating, trying an activity, breathing exercise, or movement after acknowledging feelings/emotions and intentionally creating a pause). She was observed writing a plan utilizing the technique. Overall, [name] was receptive to today's appointment as evidenced by openness to sharing, responsiveness to feedback, and willingness to implement discussed strategies .  Mental Status Examination:  Appearance: neat Behavior: appropriate to circumstances Mood: neutral Affect: mood congruent Speech:  WNL Eye Contact: appropriate Psychomotor Activity: WNL Gait: unable to assess Thought Process: linear, logical, and goal directed and no evidence or endorsement of suicidal, homicidal, and self-harm ideation, plan and intent  Thought Content/Perception: no hallucinations, delusions, bizarre thinking or behavior endorsed or observed Orientation: AAOx4 Memory/Concentration: memory, attention, language, and fund of knowledge intact  Insight: fair Judgment: fair  Interventions:  Conducted a brief chart review Provided empathic reflections and validation Employed supportive psychotherapy interventions to facilitate reduced distress and to improve coping skills with identified stressors Psychoeducation provided regarding SWAP technique   DSM-5 Diagnosis(es): F50.89 Other Specified Feeding or Eating Disorder, Emotional Eating Behaviors  Treatment Goal & Progress: During the initial appointment with this provider, the following treatment goal was established: increase coping skills. [name] has demonstrated progress in her goal as evidenced by increased awareness of hunger patterns and increased awareness of triggers for emotional eating behaviors. [name] also continues to demonstrate willingness to engage in learned skill(s).   Plan: The next appointment is scheduled for 04/03/2022 at 4pm, which will be via MyChart Video Visit. The next session will focus on working towards the established treatment goal. [name] stated a plan to re-initiate therapeutic services with the social worker with her cancer clinic.

## 2022-03-23 ENCOUNTER — Ambulatory Visit: Payer: 59 | Admitting: Bariatrics

## 2022-03-26 ENCOUNTER — Other Ambulatory Visit: Payer: Self-pay | Admitting: Bariatrics

## 2022-03-26 DIAGNOSIS — E559 Vitamin D deficiency, unspecified: Secondary | ICD-10-CM

## 2022-03-30 ENCOUNTER — Encounter: Payer: BC Managed Care – PPO | Admitting: Nurse Practitioner

## 2022-03-30 ENCOUNTER — Encounter: Payer: BC Managed Care – PPO | Admitting: Internal Medicine

## 2022-04-03 ENCOUNTER — Telehealth (INDEPENDENT_AMBULATORY_CARE_PROVIDER_SITE_OTHER): Payer: 59 | Admitting: Psychology

## 2022-04-03 DIAGNOSIS — F5089 Other specified eating disorder: Secondary | ICD-10-CM | POA: Diagnosis not present

## 2022-04-03 NOTE — Progress Notes (Signed)
  Office: [phone]  /  Fax: [phone]    Date: April 03, 2022    Appointment Start Time: 4:04pm Duration: 19 minutes Provider: Glennie Isle, Psy.D. Type of Session: Individual Therapy  Location of Patient: Work (private location) Location of Provider: Provider's Home (private office) Type of Contact: Telepsychological Visit via MyChart Video Visit  Session Content: Adiba is a 47 y.o. female presenting for a follow-up appointment to address the previously established treatment goal of increasing coping skills.Today's appointment was a telepsychological visit. Japneet provided verbal consent for today's telepsychological appointment and she is aware she is responsible for securing confidentiality on her end of the session. Prior to proceeding with today's appointment, Siobhan's physical location at the time of this appointment was obtained as well a phone number she could be reached at in the event of technical difficulties. Gabrianna and this provider participated in today's telepsychological service.   This provider conducted a brief check-in. Valeria shared about recent events, noting her mother is now residing with her brother. Despite recent stressors, she described a reduction in emotional eating behaviors. She discussed engaging in self-care to assist with coping. This provider engaged Elaria in problem solving to help increase self-care and to ensure it is consistent as it helps improve mood and reduce emotional eating behaviors. Blaize agreed to place "blocks" on her schedule specifically for self-care activities (e.g., pedicure, bath, television). Deshea was receptive to today's appointment as evidenced by openness to sharing, responsiveness to feedback, and willingness to implement discussed strategies .  Mental Status Examination:  Appearance: neat Behavior: appropriate to circumstances Mood: neutral Affect: mood congruent Speech: WNL Eye Contact: appropriate Psychomotor Activity:  WNL Gait: unable to assess Thought Process: linear, logical, and goal directed and no evidence or endorsement of suicidal, homicidal, and self-harm ideation, plan and intent  Thought Content/Perception: no hallucinations, delusions, bizarre thinking or behavior endorsed or observed Orientation: AAOx4 Memory/Concentration: memory, attention, language, and fund of knowledge intact  Insight: fair Judgment: fair  Interventions:  Conducted a brief chart review Provided empathic reflections and validation Provided positive reinforcement Employed supportive psychotherapy interventions to facilitate reduced distress and to improve coping skills with identified stressors Engaged patient in problem solving  DSM-5 Diagnosis(es): F50.89 Other Specified Feeding or Eating Disorder, Emotional Eating Behaviors  Treatment Goal & Progress: During the initial appointment with this provider, the following treatment goal was established: increase coping skills. Latonda has demonstrated progress in her goal as evidenced by increased awareness of hunger patterns, increased awareness of triggers for emotional eating behaviors, and reduction in emotional eating behaviors . Latresha also continues to demonstrate willingness to engage in learned skill(s).  Plan: The next appointment is scheduled for 04/24/2022 at 4pm, which will be via MyChart Video Visit. The next session will focus on working towards the established treatment goal. Corean stated she is still waiting to hear back from the cancer center and another provider option to initiate therapeutic services. She indicated she will follow-up with both prior to her next appointment with this provider.

## 2022-04-04 ENCOUNTER — Encounter: Payer: Self-pay | Admitting: Bariatrics

## 2022-04-04 ENCOUNTER — Ambulatory Visit (INDEPENDENT_AMBULATORY_CARE_PROVIDER_SITE_OTHER): Payer: 59 | Admitting: Bariatrics

## 2022-04-04 VITALS — BP 120/80 | HR 88 | Temp 98.7°F | Ht 68.0 in | Wt 242.0 lb

## 2022-04-04 DIAGNOSIS — Z6836 Body mass index (BMI) 36.0-36.9, adult: Secondary | ICD-10-CM | POA: Diagnosis not present

## 2022-04-04 DIAGNOSIS — R632 Polyphagia: Secondary | ICD-10-CM

## 2022-04-04 DIAGNOSIS — E559 Vitamin D deficiency, unspecified: Secondary | ICD-10-CM

## 2022-04-04 DIAGNOSIS — E669 Obesity, unspecified: Secondary | ICD-10-CM

## 2022-04-04 MED ORDER — WEGOVY 0.25 MG/0.5ML ~~LOC~~ SOAJ: 0.2500 mg | SUBCUTANEOUS | 0 refills | Status: DC

## 2022-04-04 MED ORDER — VITAMIN D (ERGOCALCIFEROL) 1.25 MG (50000 UNIT) PO CAPS: 50000.0000 [IU] | ORAL_CAPSULE | ORAL | 0 refills | Status: DC

## 2022-04-11 NOTE — Progress Notes (Unsigned)
Chief Complaint:   OBESITY Gail Erickson is here to discuss her progress with her obesity treatment plan along with follow-up of her obesity related diagnoses. Gail Erickson is on the Category 2 Plan and states she is following her eating plan approximately 50% of the time. Gail Erickson states she is doing 0 minutes 0 times per week.  Today's visit was #: 8 Starting weight: 241 lbs Starting date: 09/14/2021 Today's weight: 242 lbs Today's date: 04/04/2022 Total lbs lost to date: 0 Total lbs lost since last in-office visit: 0  Interim History: Ava has not been as aggressive on her plan.   Subjective:   1. Vitamin D deficiency Gail Erickson is taking Vitamin D as directed.   2. Polyphagia Gail Erickson was prescribed Wegovy, but she was unable to get it.   Assessment/Plan:   1. Vitamin D deficiency We will refill prescription Vitamin D for 1 month.   - Vitamin D, Ergocalciferol, (DRISDOL) 1.25 MG (50000 UNIT) CAPS capsule; Take 1 capsule (50,000 Units total) by mouth every 7 (seven) days.  Dispense: 5 capsule; Refill: 0  2. Polyphagia Gail Erickson will begin Wegovy at 0.25 mg once weekly, as she was taking this previously (unable to fill the prescription for earlier).  - Semaglutide-Weight Management (WEGOVY) 0.25 MG/0.5ML SOAJ; Inject 0.25 mg into the skin once a week.  Dispense: 2 mL; Refill: 0  3. Obesity, Current BMI 36.8 Gail Erickson is currently in the action stage of change. As such, her goal is to continue with weight loss efforts. She has agreed to the Category 2 Plan.   Meal planning was discussed. We will recheck fasting labs at her next visit.   Exercise goals: No exercise has been prescribed at this time.(Decreased exercise due to COVID, and has not started back).   Behavioral modification strategies: increasing lean protein intake, decreasing simple carbohydrates, increasing vegetables, increasing water intake, increasing high fiber foods, decreasing eating out, no skipping meals, meal planning and cooking  strategies, keeping healthy foods in the home, and planning for success.  Gail Erickson has agreed to follow-up with our clinic in 3 to 4 weeks. She was informed of the importance of frequent follow-up visits to maximize her success with intensive lifestyle modifications for her multiple health conditions.   Objective:   Blood pressure 120/80, pulse 88, temperature 98.7 F (37.1 C), height '5\' 8"'$  (1.727 m), weight 242 lb (109.8 kg), SpO2 99 %. Body mass index is 36.8 kg/m.  General: Cooperative, alert, well developed, in no acute distress. HEENT: Conjunctivae and lids unremarkable. Cardiovascular: Regular rhythm.  Lungs: Normal work of breathing. Neurologic: No focal deficits.   Lab Results  Component Value Date   CREATININE 0.98 03/16/2022   BUN 10 03/16/2022   NA 142 03/16/2022   K 3.9 03/16/2022   CL 109 03/16/2022   CO2 28 03/16/2022   Lab Results  Component Value Date   ALT 16 03/16/2022   AST 21 03/16/2022   ALKPHOS 40 03/16/2022   BILITOT 0.7 03/16/2022   Lab Results  Component Value Date   HGBA1C 5.5 09/14/2021   HGBA1C 5.4 06/13/2017   HGBA1C 5.1 07/01/2014   Lab Results  Component Value Date   INSULIN 17.5 09/14/2021   Lab Results  Component Value Date   TSH 1.120 09/14/2021   Lab Results  Component Value Date   CHOL 179 09/14/2021   HDL 46 09/14/2021   LDLCALC 119 (H) 09/14/2021   TRIG 77 09/14/2021   CHOLHDL 3.8 07/28/2021   Lab Results  Component  Value Date   VD25OH 27.3 (L) 09/14/2021   VD25OH 23 (L) 07/28/2021   VD25OH 32.8 06/02/2019   Lab Results  Component Value Date   WBC 4.2 03/16/2022   HGB 11.9 (L) 03/16/2022   HCT 34.1 (L) 03/16/2022   MCV 83.8 03/16/2022   PLT 256 03/16/2022   Lab Results  Component Value Date   IRON 67 09/06/2021   TIBC 309 09/06/2021   FERRITIN 77 09/06/2021   Attestation Statements:   Reviewed by clinician on day of visit: allergies, medications, problem list, medical history, surgical history, family  history, social history, and previous encounter notes.   Wilhemena Durie, am acting as Location manager for [employer], DO.  I have reviewed the above documentation for accuracy and completeness, and I agree with the above. Jearld Lesch, DO

## 2022-04-12 ENCOUNTER — Encounter: Payer: Self-pay | Admitting: Bariatrics

## 2022-04-24 ENCOUNTER — Telehealth (INDEPENDENT_AMBULATORY_CARE_PROVIDER_SITE_OTHER): Payer: 59 | Admitting: Psychology

## 2022-04-24 DIAGNOSIS — F5089 Other specified eating disorder: Secondary | ICD-10-CM | POA: Diagnosis not present

## 2022-04-24 NOTE — Progress Notes (Signed)
  Office: [phone]  /  Fax: [phone]    Date: April 24, 2022    Appointment Start Time: 4:03pm Duration: 30 minutes Provider: Glennie Isle, Psy.D. Type of Session: Individual Therapy  Location of Patient: Work (private location) Location of Provider: Provider's Home (private office) Type of Contact: Telepsychological Visit via MyChart Video Visit  Session Content: Gail Erickson is a 47 y.o. female presenting for a follow-up appointment to address the previously established treatment goal of increasing coping skills.Today's appointment was a telepsychological visit. Gail Erickson provided verbal consent for today's telepsychological appointment and she is aware she is responsible for securing confidentiality on her end of the session. Prior to proceeding with today's appointment, Gail Erickson's physical location at the time of this appointment was obtained as well a phone number she could be reached at in the event of technical difficulties. Gail Erickson and this provider participated in today's telepsychological service.   This provider conducted a brief check-in. Gail Erickson stated she started [facility] on Saturday, adding, "I'm not feeling like myself." She was encouraged to reach out to Dr. Owens Shark (prescribing provider); she agreed. Gail Erickson also shared about a family concern resulting in legal involvement, noting it triggered emotional eating behaviors. Further explored and processed. She indicated physical activity has helped in the past to reduce stress and improve eating habits. As such, psychoeducation regarding SMART goals was provided and Gail Erickson was engaged in goal setting. The following goal was established: Gail Erickson will engage in physical activity in the form of walking for at least 30 minutes 4 out of 7 days a week between now and the next appointment with this provider. Overall, Gail Erickson was receptive to today's appointment as evidenced by openness to sharing, responsiveness to feedback, and willingness to work toward the  established SMART goal.  Mental Status Examination:  Appearance: neat Behavior: appropriate to circumstances Mood: neutral Affect: mood congruent Speech: WNL Eye Contact: appropriate Psychomotor Activity: WNL Gait: unable to assess Thought Process: linear, logical, and goal directed and no evidence or endorsement of suicidal, homicidal, and self-harm ideation, plan and intent  Thought Content/Perception: no hallucinations, delusions, bizarre thinking or behavior endorsed or observed Orientation: AAOx4 Memory/Concentration: memory, attention, language, and fund of knowledge intact  Insight: fair Judgment: fair  Interventions:  Conducted a brief chart review Provided empathic reflections and validation Provided positive reinforcement Employed supportive psychotherapy interventions to facilitate reduced distress and to improve coping skills with identified stressors Engaged patient in goal setting Psychoeducation provided regarding SMART goals  DSM-5 Diagnosis(es): F50.89 Other Specified Feeding or Eating Disorder, Emotional Eating Behaviors  Treatment Goal & Progress: During the initial appointment with this provider, the following treatment goal was established: increase coping skills. Gail Erickson has demonstrated progress in her goal as evidenced by increased awareness of hunger patterns and increased awareness of triggers for emotional eating behaviors. Gail Erickson also continues to demonstrate willingness to engage in learned skill(s).  Plan: The next appointment is scheduled for 05/09/2022 at 4pm, which will be via MyChart Video Visit. The next session will focus on working towards the established treatment goal.

## 2022-05-04 ENCOUNTER — Ambulatory Visit: Payer: 59 | Admitting: Bariatrics

## 2022-05-09 ENCOUNTER — Telehealth (INDEPENDENT_AMBULATORY_CARE_PROVIDER_SITE_OTHER): Payer: 59 | Admitting: Psychology

## 2022-05-09 DIAGNOSIS — F5089 Other specified eating disorder: Secondary | ICD-10-CM | POA: Diagnosis not present

## 2022-05-09 DIAGNOSIS — F4320 Adjustment disorder, unspecified: Secondary | ICD-10-CM | POA: Diagnosis not present

## 2022-05-09 NOTE — Progress Notes (Signed)
  Office: [phone]  /  Fax: [phone]    Date: May 09, 2022    Appointment Start Time: 4:02pm Duration: 29 minutes Provider: Glennie Isle, Psy.D. Type of Session: Individual Therapy  Location of Patient: Work (private location) Location of Provider: Provider's Home (private office) Type of Contact: Telepsychological Visit via MyChart Video Visit  Session Content: Danielly is a 47 y.o. female presenting for a follow-up appointment to address the previously established treatment goal of increasing coping skills.Today's appointment was a telepsychological visit. Jerrel provided verbal consent for today's telepsychological appointment and she is aware she is responsible for securing confidentiality on her end of the session. Prior to proceeding with today's appointment, Julius's physical location at the time of this appointment was obtained as well a phone number she could be reached at in the event of technical difficulties. Carrianne and this provider participated in today's telepsychological service.   This provider conducted a brief check-in. Alauna reported things have not been great. She discussed ongoing stressors and acknowledged she did not meet her established SMART goal. She also reported a decrease in appetite. Further explored and processed. Estelle expressed desire to focus on developing a plan to focus on herself to ensure she is eating congruent to her goals, increase self-care, and complete tasks. She was engaged in problem solving and this provider assisted Valecia to prioritize tasks. She was observed writing. Overall, Lianne was receptive to today's appointment as evidenced by openness to sharing, responsiveness to feedback, and willingness to implement discussed strategies .  Mental Status Examination:  Appearance: neat Behavior: appropriate to circumstances Mood:  "stressed " Affect: mood congruent Speech: WNL Eye Contact: appropriate Psychomotor Activity: WNL Gait: unable to  assess Thought Process: linear, logical, and goal directed and no evidence or endorsement of suicidal, homicidal, and self-harm ideation, plan and intent  Thought Content/Perception: no hallucinations, delusions, bizarre thinking or behavior endorsed or observed Orientation: AAOx4 Memory/Concentration: memory, attention, language, and fund of knowledge intact  Insight: fair Judgment: fair  Interventions:  Conducted a brief chart review Provided empathic reflections and validation Employed supportive psychotherapy interventions to facilitate reduced distress and to improve coping skills with identified stressors Engaged patient in problem solving Recommended/discussed options for longer-term therapeutic services  DSM-5 Diagnosis(es): F50.89 Other Specified Feeding or Eating Disorder, Emotional Eating Behaviors and  F43.20 Adjustment Disorder, Unspecified   Treatment Goal & Progress: During the initial appointment with this provider, the following treatment goal was established: increase coping skills. Carloyn has demonstrated progress in her goal as evidenced by increased awareness of hunger patterns and increased awareness of triggers for emotional eating behaviors. Tam also continues to demonstrate willingness to engage in learned skill(s).  Plan: Based on appointment availability and Ladeja's schedule, the next appointment is scheduled for 06/05/2022 at 4pm, which will be via [place] Visit. The next session will focus on working towards the established treatment goal. Additionally, she provided verbal consent for this provider to place a referral with [facility]. She also provided verbal consent for this provider to e-mail additional referral options.

## 2022-05-11 ENCOUNTER — Ambulatory Visit: Payer: 59 | Admitting: Bariatrics

## 2022-05-17 ENCOUNTER — Encounter: Payer: Self-pay | Admitting: Bariatrics

## 2022-05-17 ENCOUNTER — Ambulatory Visit: Payer: 59 | Admitting: Bariatrics

## 2022-05-17 VITALS — BP 112/72 | HR 70 | Temp 98.9°F | Ht 68.0 in | Wt 239.0 lb

## 2022-05-17 DIAGNOSIS — Z6836 Body mass index (BMI) 36.0-36.9, adult: Secondary | ICD-10-CM

## 2022-05-17 DIAGNOSIS — E559 Vitamin D deficiency, unspecified: Secondary | ICD-10-CM | POA: Diagnosis not present

## 2022-05-17 DIAGNOSIS — R7303 Prediabetes: Secondary | ICD-10-CM | POA: Diagnosis not present

## 2022-05-17 DIAGNOSIS — E669 Obesity, unspecified: Secondary | ICD-10-CM

## 2022-05-17 DIAGNOSIS — E7800 Pure hypercholesterolemia, unspecified: Secondary | ICD-10-CM | POA: Diagnosis not present

## 2022-05-17 MED ORDER — ZEPBOUND 2.5 MG/0.5ML ~~LOC~~ SOAJ: 2.5000 mg | SUBCUTANEOUS | 0 refills | Status: DC

## 2022-05-17 MED ORDER — VITAMIN D (ERGOCALCIFEROL) 1.25 MG (50000 UNIT) PO CAPS: 50000.0000 [IU] | ORAL_CAPSULE | ORAL | 0 refills | Status: DC

## 2022-05-18 LAB — LIPID PANEL WITH LDL/HDL RATIO
Cholesterol, Total: 173 mg/dL (ref 100–199)
HDL: 46 mg/dL (ref >39)
LDL Chol Calc (NIH): 114 mg/dL — ABNORMAL HIGH (ref 0–99)
LDL/HDL Ratio: 2.5 ratio (ref 0.0–3.2)
Triglycerides: 65 mg/dL (ref 0–149)
VLDL Cholesterol Cal: 13 mg/dL (ref 5–40)

## 2022-05-18 LAB — HEMOGLOBIN A1C
Est. average glucose Bld gHb Est-mCnc: 111 mg/dL
Hgb A1c MFr Bld: 5.5 % (ref 4.8–5.6)

## 2022-05-18 LAB — VITAMIN D 25 HYDROXY (VIT D DEFICIENCY, FRACTURES): Vit D, 25-Hydroxy: 31.2 ng/mL (ref 30.0–100.0)

## 2022-05-18 LAB — INSULIN, RANDOM: INSULIN: 17.6 u[IU]/mL (ref 2.6–24.9)

## 2022-05-21 ENCOUNTER — Other Ambulatory Visit: Payer: Self-pay | Admitting: Hematology

## 2022-05-21 DIAGNOSIS — C50811 Malignant neoplasm of overlapping sites of right female breast: Secondary | ICD-10-CM

## 2022-05-30 NOTE — Progress Notes (Signed)
Chief Complaint:   OBESITY Gail Erickson is here to discuss her progress with her obesity treatment plan along with follow-up of her obesity related diagnoses. Gail Erickson is on the Category 2 plan   Gail Erickson states she has not been exercising.  Today's visit was #: 9 Starting weight: 241 lbs Starting date: 09/14/21 Today's weight: 239 lbs Today's date: 07/17/22 Total lbs lost to date: 2 Total lbs lost since last in-office visit: -3  Interim History: She is down 3 pounds since her last visit.  Subjective:   1. Vitamin D deficiency Taking as directed.  2. Prediabetes Taking [id number].  Last dose on Saturday.  Still hungry at times. Wegovy not as effective.  Mounjaro in the past-some GI upset.  Saxenda-nationwide shortage.  3. Elevated cholesterol No medications.  Assessment/Plan:   1. Vitamin D deficiency 1.  Refill: - Vitamin D, Ergocalciferol, (DRISDOL) 1.25 MG (50000 UNIT) CAPS capsule; Take 1 capsule (50,000 Units total) by mouth every 7 (seven) days.  Dispense: 5 capsule; Refill: 0 2.  Check vitamin D. - VITAMIN D 25 Hydroxy (Vit-D Deficiency, Fractures)  2. Prediabetes 1.  Check A1c and insulin. - Hemoglobin A1c - Insulin, random 2.  New prescription-Zepbound 2.5 mg into the skin once weekly, dispense 2 mL, no refills.  3. Elevated cholesterol Check lipids. - Lipid Panel With LDL/HDL Ratio  4. Generalized obesity BMI 36.0-36.9,adult 1.  Meal planning. 2.  Intentional eating.  Gail Erickson is currently in the action stage of change. As such, her goal is to continue with weight loss efforts. She has agreed to the Category 2 plan.  Exercise goals: All adults should avoid inactivity. Some physical activity is better than none, and adults who participate in any amount of physical activity gain some health benefits.  Behavioral modification strategies: increasing lean protein intake, decreasing simple carbohydrates, increasing vegetables, increasing water intake, decreasing eating out,  no skipping meals, meal planning and cooking strategies, keeping healthy foods in the home, and planning for success.  Gail Erickson has agreed to follow-up with our clinic in 3-4 weeks. She was informed of the importance of frequent follow-up visits to maximize her success with intensive lifestyle modifications for her multiple health conditions.   Gail Erickson was informed we would discuss her lab results at her next visit unless there is a critical issue that needs to be addressed sooner. Gail Erickson agreed to keep her next visit at the agreed upon time to discuss these results.  Objective:   Blood pressure 112/72, pulse 70, temperature 98.9 F (37.2 C), height 5\' 8"  (1.727 m), weight 239 lb (108.4 kg), SpO2 99 %. Body mass index is 36.34 kg/m.  General: Cooperative, alert, well developed, in no acute distress. HEENT: Conjunctivae and lids unremarkable. Cardiovascular: Regular rhythm.  Lungs: Normal work of breathing. Neurologic: No focal deficits.   Lab Results  Component Value Date   CREATININE 0.98 03/16/2022   BUN 10 03/16/2022   NA 142 03/16/2022   K 3.9 03/16/2022   CL 109 03/16/2022   CO2 28 03/16/2022   Lab Results  Component Value Date   ALT 16 03/16/2022   AST 21 03/16/2022   ALKPHOS 40 03/16/2022   BILITOT 0.7 03/16/2022   Lab Results  Component Value Date   HGBA1C 5.5 05/17/2022   HGBA1C 5.5 09/14/2021   HGBA1C 5.4 06/13/2017   HGBA1C 5.1 07/01/2014   Lab Results  Component Value Date   INSULIN 17.6 05/17/2022   INSULIN 17.5 09/14/2021   Lab Results  Component Value  Date   TSH 1.120 09/14/2021   Lab Results  Component Value Date   CHOL 173 05/17/2022   HDL 46 05/17/2022   LDLCALC 114 (H) 05/17/2022   TRIG 65 05/17/2022   CHOLHDL 3.8 07/28/2021   Lab Results  Component Value Date   VD25OH 31.2 05/17/2022   VD25OH 27.3 (L) 09/14/2021   VD25OH 23 (L) 07/28/2021   Lab Results  Component Value Date   WBC 4.2 03/16/2022   HGB 11.9 (L) 03/16/2022   HCT 34.1 (L)  03/16/2022   MCV 83.8 03/16/2022   PLT 256 03/16/2022   Lab Results  Component Value Date   IRON 67 09/06/2021   TIBC 309 09/06/2021   FERRITIN 77 09/06/2021    Attestation Statements:   Reviewed by clinician on day of visit: allergies, medications, problem list, medical history, surgical history, family history, social history, and previous encounter notes.  I, Dawn Whitmire, FNP-C, am acting as transcriptionist for Dr. Jearld Lesch.  I have reviewed the above documentation for accuracy and completeness, and I agree with the above. Jearld Lesch, DO

## 2022-06-01 ENCOUNTER — Telehealth: Payer: Self-pay

## 2022-06-01 ENCOUNTER — Telehealth (INDEPENDENT_AMBULATORY_CARE_PROVIDER_SITE_OTHER): Payer: Self-pay | Admitting: Bariatrics

## 2022-06-01 NOTE — Telephone Encounter (Signed)
Started prior authorization for [employer]

## 2022-06-01 NOTE — Telephone Encounter (Signed)
Patient called stating that [facility] on [address] in [place] will not fill her new rx of Zepbound without a PA. Patient wants someone from our office to call her regarding the PA and an alternative to Zepbound if no PA can be obtained. Please call pt at number on file. Thank You!       [facility].

## 2022-06-01 NOTE — Telephone Encounter (Signed)
Notified patient that I will contact insurance to see if we have an update and give her a call. Patient verbalized understanding.

## 2022-06-05 ENCOUNTER — Telehealth (INDEPENDENT_AMBULATORY_CARE_PROVIDER_SITE_OTHER): Payer: 59 | Admitting: Psychology

## 2022-06-09 ENCOUNTER — Ambulatory Visit (INDEPENDENT_AMBULATORY_CARE_PROVIDER_SITE_OTHER): Payer: 59 | Admitting: Family Medicine

## 2022-06-09 ENCOUNTER — Encounter: Payer: Self-pay | Admitting: Family Medicine

## 2022-06-09 ENCOUNTER — Other Ambulatory Visit: Payer: Self-pay | Admitting: Family Medicine

## 2022-06-09 ENCOUNTER — Ambulatory Visit (HOSPITAL_COMMUNITY)
Admission: RE | Admit: 2022-06-09 | Discharge: 2022-06-09 | Disposition: A | Discharge status: Home / Self Care | Payer: 59 | Source: Ambulatory Visit | Attending: Family Medicine | Admitting: Family Medicine

## 2022-06-09 VITALS — BP 122/79 | HR 76 | Ht 68.0 in | Wt 243.0 lb

## 2022-06-09 DIAGNOSIS — M549 Dorsalgia, unspecified: Secondary | ICD-10-CM | POA: Insufficient documentation

## 2022-06-09 DIAGNOSIS — M545 Low back pain, unspecified: Secondary | ICD-10-CM | POA: Insufficient documentation

## 2022-06-09 DIAGNOSIS — M542 Cervicalgia: Secondary | ICD-10-CM | POA: Insufficient documentation
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
## 2022-06-09 NOTE — Assessment & Plan Note (Signed)
Xray ordered- will follow up awaiting results. Explained to patient Non pharmacological interventions include the use of ice or heat, rest, recommend range of motion exercises, gentle stretching. The use of NSAIDs for pain management.  Follow up for worsening or persistent symptoms. Patient verbalizes understanding regarding plan of care and all questions answered.  

## 2022-06-09 NOTE — Assessment & Plan Note (Addendum)
Xray ordered- will follow up awaiting results  Discussed medication desired effects, potential side effects. Non pharmacological interventions include rest, avoid twisting, improper bending, straining lower back. Demonstration of proper body mechanics. Alternate ice and heat. Recommend stretching back and legs. Follow up for worsening or persistent symptoms. Patient verbalizes understanding regarding plan of care and all questions answered.

## 2022-06-09 NOTE — Patient Instructions (Signed)
It was pleasure meeting with you today. Follow up with your primary health provider if any health concerns arises. If symptoms worsen please contact your primary care provider and/or visit the emergency department.  

## 2022-06-12 ENCOUNTER — Encounter: Payer: Self-pay | Admitting: Family Medicine

## 2022-06-14 ENCOUNTER — Ambulatory Visit: Payer: 59 | Admitting: Bariatrics

## 2022-06-19 ENCOUNTER — Telehealth (INDEPENDENT_AMBULATORY_CARE_PROVIDER_SITE_OTHER): Payer: 59 | Admitting: Psychology

## 2022-06-19 DIAGNOSIS — F5089 Other specified eating disorder: Secondary | ICD-10-CM | POA: Diagnosis not present

## 2022-06-19 DIAGNOSIS — F4320 Adjustment disorder, unspecified: Secondary | ICD-10-CM | POA: Diagnosis not present
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
## 2022-06-25 ENCOUNTER — Other Ambulatory Visit: Payer: Self-pay | Admitting: Hematology

## 2022-06-25 DIAGNOSIS — C50811 Malignant neoplasm of overlapping sites of right female breast: Secondary | ICD-10-CM

## 2022-07-04 ENCOUNTER — Telehealth (INDEPENDENT_AMBULATORY_CARE_PROVIDER_SITE_OTHER): Payer: 59 | Admitting: Psychology

## 2022-07-04 DIAGNOSIS — F5089 Other specified eating disorder: Secondary | ICD-10-CM | POA: Diagnosis not present

## 2022-07-04 DIAGNOSIS — F4320 Adjustment disorder, unspecified: Secondary | ICD-10-CM | POA: Diagnosis not present

## 2022-07-04 NOTE — Progress Notes (Signed)
  Office: [phone]  /  Fax: [phone]    Date: July 04, 2022    Appointment Start Time: 12:32pm Duration: 25 minutes Provider: Lawerance Cruel, Psy.D. Type of Session: Individual Therapy  Location of Patient: Work (private location) Location of Provider: Provider's Home (private office) Type of Contact: Telepsychological Visit via MyChart Video Visit  Session Content: Gail Erickson is a 47 y.o. female presenting for a follow-up appointment to address the previously established treatment goal of increasing coping skills.Today's appointment was a telepsychological visit. Gail Erickson provided verbal consent for today's telepsychological appointment and she is aware she is responsible for securing confidentiality on her end of the session. Prior to proceeding with today's appointment, Gail Erickson's physical location at the time of this appointment was obtained as well a phone number she could be reached at in the event of technical difficulties. Gail Erickson and this provider participated in today's telepsychological service.   This provider conducted a brief check-in. Gail Erickson shared about her step-mother's funeral service in [place] over the weekend. Associated thoughts and feelings were processed. Briefly discussed the grieving process. Gail Erickson acknowledged instances of emotional eating behaviors while in [place]. Further explored and processed. She denied any concerns related to emotional eating behaviors since returning or prior to her trip. Given recent stressors, discussed importance of self-care. Reviewed this provider's role with the clinic, and discussed establishing care with a primary therapist. Furthermore, termination planning was discussed. Gail Erickson was receptive to a follow-up appointment in 3-4 weeks and an additional follow-up/termination appointment in 3-4 weeks after that. Overall, Gail Erickson was receptive to today's appointment as evidenced by openness to sharing, responsiveness to feedback, and willingness to continue engaging  in learned skills.  Mental Status Examination:  Appearance: neat Behavior: appropriate to circumstances Mood: neutral Affect: mood congruent Speech: WNL Eye Contact: appropriate Psychomotor Activity: WNL Gait: unable to assess Thought Process: linear, logical, and goal directed and no evidence or endorsement of suicidal, homicidal, and self-harm ideation, plan and intent  Thought Content/Perception: no hallucinations, delusions, bizarre thinking or behavior endorsed or observed Orientation: AAOx4 Memory/Concentration: memory, attention, language, and fund of knowledge intact  Insight: fair Judgment: fair  Interventions:  Conducted a brief chart review Provided empathic reflections and validation Provided positive reinforcement Employed supportive psychotherapy interventions to facilitate reduced distress and to improve coping skills with identified stressors Reviewed learned skills Recommended/discussed options for longer-term therapeutic services  DSM-5 Diagnosis(es):  F50.89 Other Specified Feeding or Eating Disorder, Emotional Eating Behaviors and  F43.20 Adjustment Disorder, Unspecified   Treatment Goal & Progress: During the initial appointment with this provider, the following treatment goal was established: increase coping skills. Gail Erickson has demonstrated progress in her goal as evidenced by increased awareness of hunger patterns, increased awareness of triggers for emotional eating behaviors, and reduction in emotional eating behaviors . Tamla also continues to demonstrate willingness to engage in learned skill(s).  Plan: The next appointment is scheduled for 08/01/2022 at 4pm, which will be via MyChart Video Visit. The next session will focus on working towards the established treatment goal. Additionally, she provided verbal consent for this provider to place a referral with [facility] again.

## 2022-07-10 ENCOUNTER — Encounter: Payer: Self-pay | Admitting: Bariatrics

## 2022-07-10 ENCOUNTER — Ambulatory Visit (INDEPENDENT_AMBULATORY_CARE_PROVIDER_SITE_OTHER): Payer: 59 | Admitting: Bariatrics

## 2022-07-10 VITALS — BP 127/80 | HR 70 | Temp 98.0°F | Ht 68.0 in | Wt 245.0 lb

## 2022-07-10 DIAGNOSIS — E782 Mixed hyperlipidemia: Secondary | ICD-10-CM | POA: Diagnosis not present

## 2022-07-10 DIAGNOSIS — R632 Polyphagia: Secondary | ICD-10-CM | POA: Diagnosis not present

## 2022-07-10 DIAGNOSIS — E669 Obesity, unspecified: Secondary | ICD-10-CM | POA: Diagnosis not present

## 2022-07-10 DIAGNOSIS — K5909 Other constipation: Secondary | ICD-10-CM | POA: Diagnosis not present

## 2022-07-10 DIAGNOSIS — Z6837 Body mass index (BMI) 37.0-37.9, adult: Secondary | ICD-10-CM

## 2022-07-10 MED ORDER — ZEPBOUND 5 MG/0.5ML ~~LOC~~ SOAJ: 5.0000 mg | SUBCUTANEOUS | 0 refills | Status: DC

## 2022-07-11 ENCOUNTER — Encounter: Payer: Self-pay | Admitting: Bariatrics

## 2022-07-11 NOTE — Progress Notes (Signed)
Chief Complaint:   OBESITY Gail Erickson is here to discuss her progress with her obesity treatment plan along with follow-up of her obesity related diagnoses. Gail Erickson is on the Category 2 Plan and states she is following her eating plan approximately 50% of the time. Gail Erickson states she is doing 0 minutes 0 times per week.  Today's visit was #: 10 Starting weight: 241 lbs Starting date: 09/14/2021 Today's weight: 245 lbs Today's date: 07/10/2022 Total lbs lost to date: 0 Total lbs lost since last in-office visit: 0  Interim History: Gail Erickson is up 6 lbs since her last visit. She is increasing her fiber. Her constipation is better.   Subjective:   1. Polyphagia Gail Erickson increased caregiver stress. Zepbound was approved on 06/06/2022. She is taking Zepbound 5 mg, and she notes some control with appetite.   2. Mixed hyperlipidemia Gail Erickson is taking Omega 3.  3. Other constipation Gail Erickson is not taking medications.   Assessment/Plan:   1. Polyphagia Gail Erickson continue Zepbound 5 mg once weekly, and we will refill for 1 month.   - tirzepatide (ZEPBOUND) 5 MG/0.5ML Pen; Inject 5 mg into the skin once a week.  Dispense: 2 mL; Refill: 0  2. Mixed hyperlipidemia Gail Erickson will continue Omega 3.  3. Other constipation Gail Erickson probiotics, and increase water.   4. Generalized obesity - tirzepatide (ZEPBOUND) 5 MG/0.5ML Pen; Inject 5 mg into the skin once a week.  Dispense: 2 mL; Refill: 0  5. BMI 37.0-37.9, adult We will refill Zepbound for 1 month.   - tirzepatide (ZEPBOUND) 5 MG/0.5ML Pen; Inject 5 mg into the skin once a week.  Dispense: 2 mL; Refill: 0  Gail Erickson is currently in the action stage of change. As such, her goal is to continue with weight loss efforts. She has agreed to the Category 2 Plan.   She will adhere closely to the plan 85-95%. Meal planning was discussed.   Exercise goals: No exercise has been prescribed at this time.  Behavioral modification strategies: increasing lean  protein intake, decreasing simple carbohydrates, increasing vegetables, increasing water intake, decreasing eating out, no skipping meals, meal planning and cooking strategies, keeping healthy foods in the home, and planning for success.  Gail Erickson has agreed to follow-up with our clinic in 3 weeks. She was informed of the importance of frequent follow-up visits to maximize her success with intensive lifestyle modifications for her multiple health conditions.   Objective:   Blood pressure 127/80, pulse 70, temperature 98 F (36.7 C), height 5\' 8"  (1.727 m), weight 245 lb (111.1 kg), SpO2 98 %. Body mass index is 37.25 kg/m.  General: Cooperative, alert, well developed, in no acute distress. HEENT: Conjunctivae and lids unremarkable. Cardiovascular: Regular rhythm.  Lungs: Normal work of breathing. Neurologic: No focal deficits.   Lab Results  Component Value Date   CREATININE 0.98 03/16/2022   BUN 10 03/16/2022   NA 142 03/16/2022   K 3.9 03/16/2022   CL 109 03/16/2022   CO2 28 03/16/2022   Lab Results  Component Value Date   ALT 16 03/16/2022   AST 21 03/16/2022   ALKPHOS 40 03/16/2022   BILITOT 0.7 03/16/2022   Lab Results  Component Value Date   HGBA1C 5.5 05/17/2022   HGBA1C 5.5 09/14/2021   HGBA1C 5.4 06/13/2017   HGBA1C 5.1 07/01/2014   Lab Results  Component Value Date   INSULIN 17.6 05/17/2022   INSULIN 17.5 09/14/2021   Lab Results  Component Value Date   TSH  1.120 09/14/2021   Lab Results  Component Value Date   CHOL 173 05/17/2022   HDL 46 05/17/2022   LDLCALC 114 (H) 05/17/2022   TRIG 65 05/17/2022   CHOLHDL 3.8 07/28/2021   Lab Results  Component Value Date   VD25OH 31.2 05/17/2022   VD25OH 27.3 (L) 09/14/2021   VD25OH 23 (L) 07/28/2021   Lab Results  Component Value Date   WBC 4.2 03/16/2022   HGB 11.9 (L) 03/16/2022   HCT 34.1 (L) 03/16/2022   MCV 83.8 03/16/2022   PLT 256 03/16/2022   Lab Results  Component Value Date   IRON 67  09/06/2021   TIBC 309 09/06/2021   FERRITIN 77 09/06/2021   Attestation Statements:   Reviewed by clinician on day of visit: allergies, medications, problem list, medical history, surgical history, family history, social history, and previous encounter notes.   Trude Mcburney, am acting as Energy manager for [employer], DO.  I have reviewed the above documentation for accuracy and completeness, and I agree with the above. Corinna Capra, DO

## 2022-07-14 ENCOUNTER — Other Ambulatory Visit: Payer: Self-pay | Admitting: Bariatrics

## 2022-07-14 DIAGNOSIS — Z6837 Body mass index (BMI) 37.0-37.9, adult: Secondary | ICD-10-CM

## 2022-07-14 DIAGNOSIS — R632 Polyphagia: Secondary | ICD-10-CM

## 2022-07-14 DIAGNOSIS — E669 Obesity, unspecified: Secondary | ICD-10-CM
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
## 2022-07-31 ENCOUNTER — Other Ambulatory Visit (INDEPENDENT_AMBULATORY_CARE_PROVIDER_SITE_OTHER): Payer: Self-pay | Admitting: Bariatrics

## 2022-07-31 DIAGNOSIS — Z6837 Body mass index (BMI) 37.0-37.9, adult: Secondary | ICD-10-CM

## 2022-07-31 DIAGNOSIS — R632 Polyphagia: Secondary | ICD-10-CM

## 2022-07-31 DIAGNOSIS — E669 Obesity, unspecified: Secondary | ICD-10-CM

## 2022-07-31 NOTE — Telephone Encounter (Signed)
Dr. Brown, please advise.

## 2022-07-31 NOTE — Telephone Encounter (Signed)
5/20 Patient stated that she is out of her Zepbound. Patient stated that she out sooner than expected because one of the Zepbound pin was defective. Patient has an appt on 5/29. Offered early appt but patient declined. JE

## 2022-08-01 ENCOUNTER — Telehealth (INDEPENDENT_AMBULATORY_CARE_PROVIDER_SITE_OTHER): Payer: 59 | Admitting: Psychology

## 2022-08-01 ENCOUNTER — Other Ambulatory Visit (INDEPENDENT_AMBULATORY_CARE_PROVIDER_SITE_OTHER): Payer: Self-pay

## 2022-08-01 ENCOUNTER — Other Ambulatory Visit (INDEPENDENT_AMBULATORY_CARE_PROVIDER_SITE_OTHER): Payer: Self-pay | Admitting: Physician Assistant

## 2022-08-01 DIAGNOSIS — Z6837 Body mass index (BMI) 37.0-37.9, adult: Secondary | ICD-10-CM

## 2022-08-01 DIAGNOSIS — F4320 Adjustment disorder, unspecified: Secondary | ICD-10-CM

## 2022-08-01 DIAGNOSIS — R632 Polyphagia: Secondary | ICD-10-CM

## 2022-08-01 DIAGNOSIS — E669 Obesity, unspecified: Secondary | ICD-10-CM

## 2022-08-01 DIAGNOSIS — F5089 Other specified eating disorder: Secondary | ICD-10-CM | POA: Diagnosis not present

## 2022-08-01 MED ORDER — ZEPBOUND 5 MG/0.5ML ~~LOC~~ SOAJ: 5.0000 mg | SUBCUTANEOUS | 0 refills | Status: DC

## 2022-08-01 NOTE — Progress Notes (Signed)
Reordered Zepbound 5 mg to [employer] as patient reports had defect pen and will run out before next visit.  Tawan Degroote,PA-C

## 2022-08-01 NOTE — Progress Notes (Signed)
  Office: [phone]  /  Fax: [phone]    Date: Aug 01, 2022    Appointment Start Time: 4:05pm Duration: 19 minutes Provider: Lawerance Cruel, Psy.D. Type of Session: Individual Therapy  Location of Patient: Work (private location) Location of Provider: [employer] (private office) Type of Contact: Telepsychological Visit via MyChart Video Visit  Session Content: This provider called [name] at 4:03pm as she did not present for today's appointment. This provider was unable to leave a voicemail as her mailbox was full. She was observed joining shortly after. As such, today's appointment was initiated 5 minutes late.Gail Erickson is a 46 y.o. female presenting for a follow-up appointment to address the previously established treatment goal of increasing coping skills.Today's appointment was a telepsychological visit. [name] provided verbal consent for today's telepsychological appointment and she is aware she is responsible for securing confidentiality on her end of the session. Prior to proceeding with today's appointment, [name]'s physical location at the time of this appointment was obtained as well a phone number she could be reached at in the event of technical difficulties. Persais and this provider participated in today's telepsychological service.   This provider conducted a brief check-in. Ramla shared about recent events. She noted engagement in "grazing" behaviors when working from home. Of note, she stated she has not consumed soda in three weeks. Her eating habits were explored given recent weight gain and engagement in emotional eating behaviors. She was engaged in problem solving as it was identified [name] is likely not eating enough, especially protein. Syerra agreed to implement the following: set reminders to eat smaller/frequent meals; double recipes; take meals/snacks to leave at work for the week; and purchase microwave vegetables. She was observed making notes of the aforementioned.  Overall, [name] was receptive to today's appointment as evidenced by openness to sharing, responsiveness to feedback, and willingness to implement discussed strategies .  Mental Status Examination:  Appearance: neat Behavior: appropriate to circumstances Mood: neutral Affect: mood congruent Speech: WNL Eye Contact: appropriate Psychomotor Activity: WNL Gait: unable to assess Thought Process: linear, logical, and goal directed and no evidence or endorsement of suicidal, homicidal, and self-harm ideation, plan and intent  Thought Content/Perception: no hallucinations, delusions, bizarre thinking or behavior endorsed or observed Orientation: AAOx4 Memory/Concentration: memory, attention, language, and fund of knowledge intact  Insight: fair Judgment: fair  Interventions:  Conducted a brief chart review Provided empathic reflections and validation Employed supportive psychotherapy interventions to facilitate reduced distress and to improve coping skills with identified stressors Engaged patient in problem solving  DSM-5 Diagnosis(es):  F50.89 Other Specified Feeding or Eating Disorder, Emotional Eating Behaviors and  F43.20 Adjustment Disorder, Unspecified   Treatment Goal & Progress: During the initial appointment with this provider, the following treatment goal was established: increase coping skills. [name] has demonstrated progress in her goal as evidenced by increased awareness of hunger patterns, increased awareness of triggers for emotional eating behaviors, and reduction in emotional eating behaviors . [name] also continues to demonstrate willingness to engage in learned skill(s).  Plan: Based on appointment availability and [name]'s schedule, the next appointment is scheduled for 09/05/2022 at 4pm, which will be via MyChart Video Visit. The next session will focus on working towards the established treatment goal and possible termination . [name] stated she has two consultations set up to  initiate therapeutic services.

## 2022-08-01 NOTE — Addendum Note (Signed)
Addended by: Teressa Senter on: 08/01/2022 08:21 AM   Modules accepted: Orders

## 2022-08-02 ENCOUNTER — Telehealth (INDEPENDENT_AMBULATORY_CARE_PROVIDER_SITE_OTHER): Payer: Self-pay

## 2022-08-02 NOTE — Telephone Encounter (Signed)
See my chart message

## 2022-08-03 ENCOUNTER — Ambulatory Visit (INDEPENDENT_AMBULATORY_CARE_PROVIDER_SITE_OTHER): Payer: 59 | Admitting: Obstetrics and Gynecology

## 2022-08-03 ENCOUNTER — Other Ambulatory Visit (HOSPITAL_COMMUNITY)
Admission: RE | Admit: 2022-08-03 | Discharge: 2022-08-03 | Disposition: A | Discharge status: Home / Self Care | Payer: 59 | Source: Ambulatory Visit | Attending: Obstetrics and Gynecology | Admitting: Obstetrics and Gynecology

## 2022-08-03 ENCOUNTER — Encounter: Payer: Self-pay | Admitting: Obstetrics and Gynecology

## 2022-08-03 VITALS — BP 118/74 | HR 79 | Ht 68.5 in | Wt 245.0 lb

## 2022-08-03 DIAGNOSIS — Z113 Encounter for screening for infections with a predominantly sexual mode of transmission: Secondary | ICD-10-CM | POA: Diagnosis not present

## 2022-08-03 DIAGNOSIS — Z124 Encounter for screening for malignant neoplasm of cervix: Secondary | ICD-10-CM | POA: Insufficient documentation

## 2022-08-03 DIAGNOSIS — Z01419 Encounter for gynecological examination (general) (routine) without abnormal findings: Secondary | ICD-10-CM

## 2022-08-03 DIAGNOSIS — Z1159 Encounter for screening for other viral diseases: Secondary | ICD-10-CM

## 2022-08-03 DIAGNOSIS — N390 Urinary tract infection, site not specified: Secondary | ICD-10-CM | POA: Diagnosis not present

## 2022-08-03 DIAGNOSIS — Z114 Encounter for screening for human immunodeficiency virus [HIV]: Secondary | ICD-10-CM

## 2022-08-03 NOTE — Patient Instructions (Signed)

## 2022-08-05 LAB — URINALYSIS, COMPLETE W/RFL CULTURE
Bilirubin Urine: NEGATIVE
Glucose, UA: NEGATIVE
Hgb urine dipstick: NEGATIVE
Hyaline Cast: NONE SEEN /LPF
Ketones, ur: NEGATIVE
Leukocyte Esterase: NEGATIVE
Nitrites, Initial: NEGATIVE
Protein, ur: NEGATIVE
RBC / HPF: NONE SEEN /HPF (ref 0–2)
Specific Gravity, Urine: 1.01 (ref 1.001–1.035)
pH: 6 (ref 5.0–8.0)

## 2022-08-05 LAB — HEPATITIS C ANTIBODY: Hepatitis C Ab: NONREACTIVE

## 2022-08-05 LAB — HIV ANTIBODY (ROUTINE TESTING W REFLEX): HIV 1&2 Ab, 4th Generation: NONREACTIVE

## 2022-08-05 LAB — URINE CULTURE
MICRO NUMBER:: 14995198
Result:: NO GROWTH
SPECIMEN QUALITY:: ADEQUATE

## 2022-08-05 LAB — CULTURE INDICATED

## 2022-08-05 LAB — RPR: RPR Ser Ql: NONREACTIVE

## 2022-08-07 ENCOUNTER — Other Ambulatory Visit: Payer: Self-pay | Admitting: Nurse Practitioner

## 2022-08-07 DIAGNOSIS — B001 Herpesviral vesicular dermatitis: Secondary | ICD-10-CM

## 2022-08-08 ENCOUNTER — Ambulatory Visit
Admission: RE | Admit: 2022-08-08 | Discharge: 2022-08-08 | Disposition: A | Discharge status: Home / Self Care | Payer: 59 | Source: Ambulatory Visit | Attending: Nurse Practitioner | Admitting: Nurse Practitioner

## 2022-08-08 DIAGNOSIS — C50811 Malignant neoplasm of overlapping sites of right female breast: Secondary | ICD-10-CM

## 2022-08-08 MED ORDER — GADOPICLENOL 0.5 MMOL/ML IV SOLN
10.0000 mL | Freq: Once | INTRAVENOUS | Status: AC | PRN
  Administered 2022-08-08: 10 mL via INTRAVENOUS

## 2022-08-09 ENCOUNTER — Ambulatory Visit: Payer: 59 | Admitting: Bariatrics

## 2022-08-10 ENCOUNTER — Ambulatory Visit (INDEPENDENT_AMBULATORY_CARE_PROVIDER_SITE_OTHER): Payer: 59 | Admitting: Podiatry

## 2022-08-10 DIAGNOSIS — B351 Tinea unguium: Secondary | ICD-10-CM

## 2022-08-10 MED ORDER — TERBINAFINE HCL 250 MG PO TABS: 250.0000 mg | ORAL_TABLET | Freq: Every day | ORAL | 0 refills | Status: AC

## 2022-08-11 NOTE — Progress Notes (Signed)
  Subjective:  Patient ID: Gail Erickson, female    DOB: 1975-12-07,  MRN: [id number]  Chief Complaint  Patient presents with   Nail Problem    6 mo follow up after nail fungus treatment. Patient reports that nails have not improved much since last office visit    47 y.o. female presents with the above complaint. History confirmed with patient.  Has not noticed much improvement since she started the Diflucan, not nearly as improved as the Lamisil  Objective:  Physical Exam: warm, good capillary refill, no trophic changes or ulcerative lesions, normal DP and PT pulses, normal sensory exam, and onychomycosis, not much change since last visit has had improvement since initiation of therapy         Assessment:   1. Onychomycosis      Plan:  Patient was evaluated and treated and all questions answered.  Has not responded as well to Diflucan as I would have hoped.  Did much better with the terbinafine.  Will plan for another 90-day course and I will discuss with her oncologist if they are okay with this.  Photographs taken.  Should be the final treatment course, we discussed that there may be a maximum therapy in regards to clinical cure versus mycologic cure    Return in about 4 months (around 12/11/2022) for follow up after nail fungus treatment.

## 2022-08-12 ENCOUNTER — Other Ambulatory Visit: Payer: Self-pay | Admitting: Bariatrics

## 2022-08-12 DIAGNOSIS — E559 Vitamin D deficiency, unspecified: Secondary | ICD-10-CM

## 2022-08-13 IMAGING — MR MR BREAST BILAT WO/W CM
8 of 12 series · 33 of 48 positions shown · IV contrast (10 ml gadavist)
Comparison: Mammography January 31, 2021. Breast MRI February 21, 2020.

CLINICAL DATA: Right mastectomy for breast cancer. Left breast
reduction. Strong family history including a mother diagnosed with
breast cancer at the age of 39, an aunt diagnosed in her 40s, and
another aunt diagnosed in her 70s.

EXAM:
BILATERAL BREAST MRI WITH AND WITHOUT CONTRAST
TECHNIQUE: Multiplanar, multisequence MR images of both breasts were obtained
prior to and following the intravenous administration of 10 ml of
Gadavist

[Series 2: t2_tirm_tra ipat (a-p) · axial · 3.0mm · 0.70mm/px · 1 of 57 slices shown]
[im 1/57]
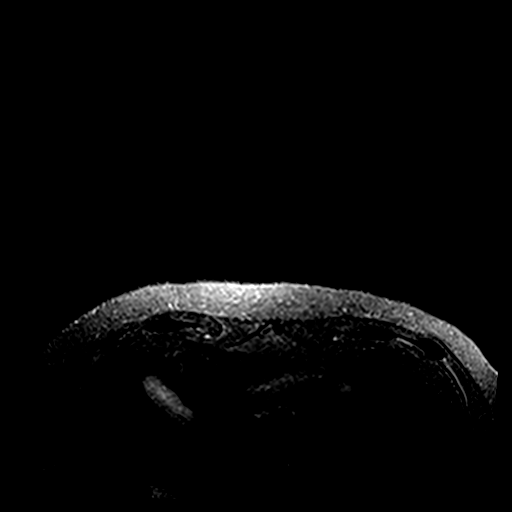

[Series 3: fl3d pre-cm no · axial · non-contrast · 1.2mm · 0.94mm/px · z∈[-74,+97]mm · 5 of 144 slices shown]
[im 1/144]
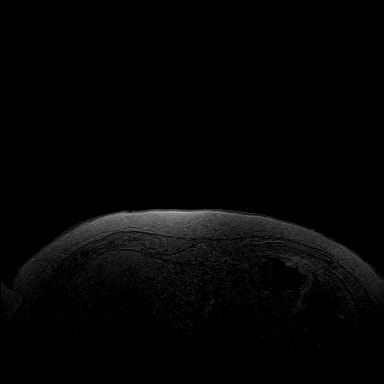
[im 36/144]
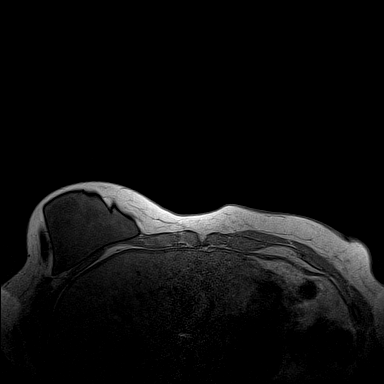
[im 72/144]
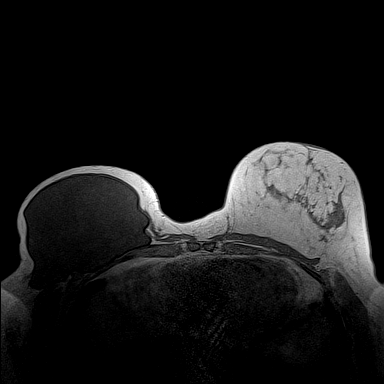
[im 108/144]
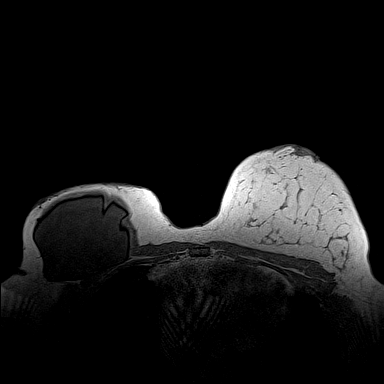
[im 144/144]
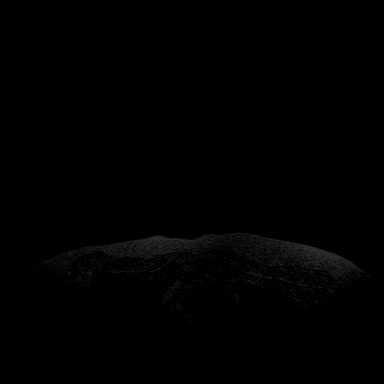

[Series 5: fl3d pre-cm · axial · non-contrast · 1.2mm · 0.94mm/px · z∈[-74,+97]mm · 5 of 144 slices shown]
[im 1/144]
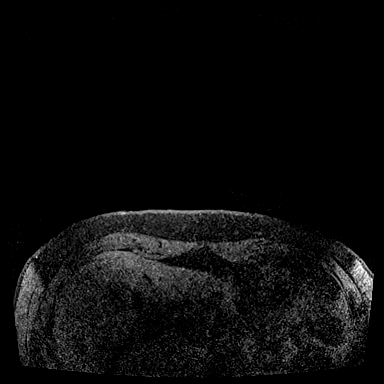
[im 36/144]
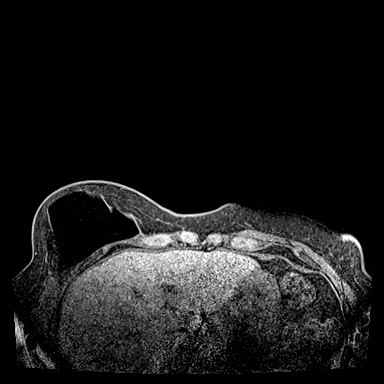
[im 72/144]
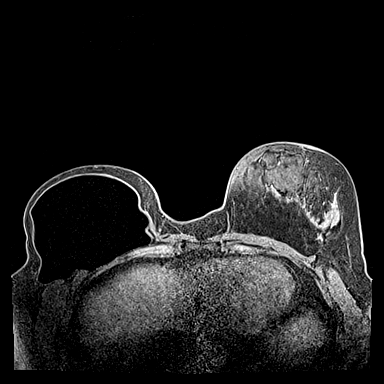
[im 108/144]
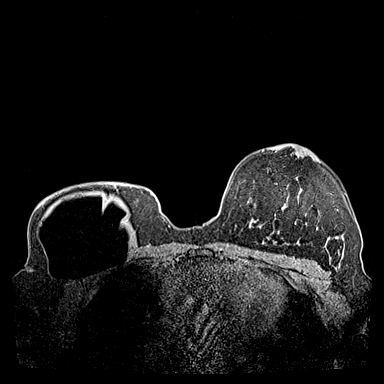
[im 144/144]
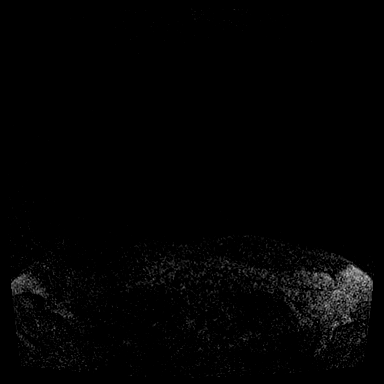

[Series 6: fl3d post-cm 20 · axial · 1.2mm · 0.94mm/px · z∈[-74,+97]mm · 5 of 144 slices shown (1 of 3)]
[im 1/144]
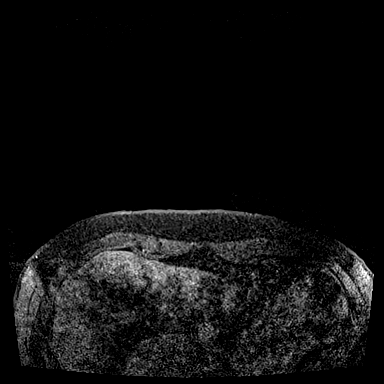
[im 36/144]
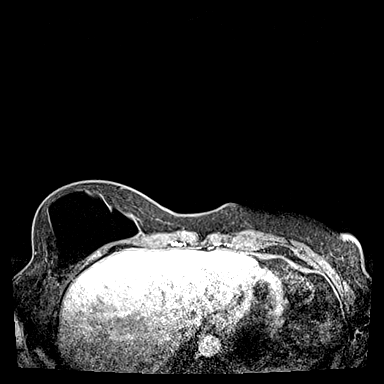
[im 72/144]
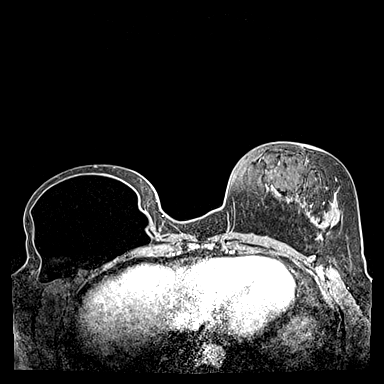
[im 108/144]
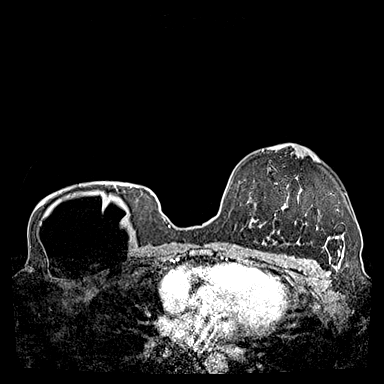
[im 144/144]
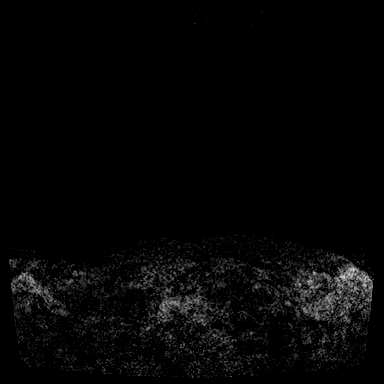

[Series 7: fl3d post-cm 20 · axial · 1.2mm · 0.94mm/px · z∈[-74,+97]mm · 5 of 144 slices shown (2 of 3)]
[im 1/144]
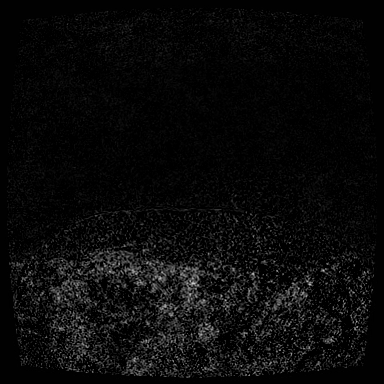
[im 36/144]
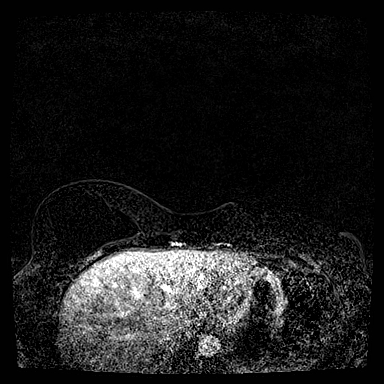
[im 72/144]
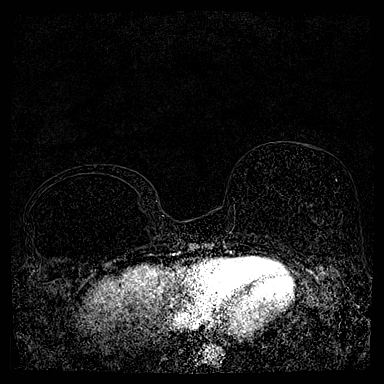
[im 108/144]
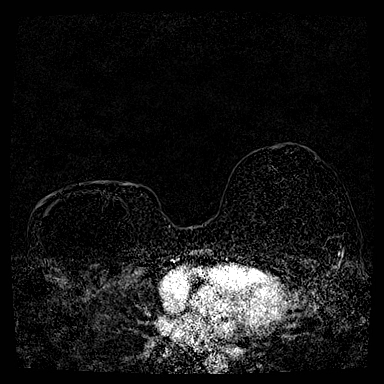
[im 144/144]
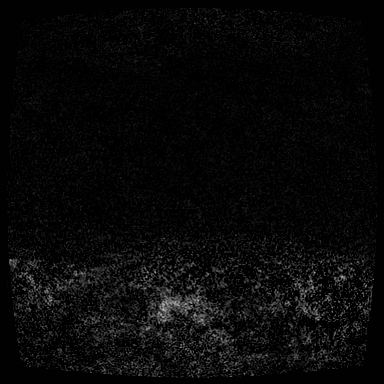

[Series 8: fl3d post-cm 20 · axial · 172.8mm · 0.94mm/px · 1 of 1 slices shown (3 of 3)]
[im 1/1]
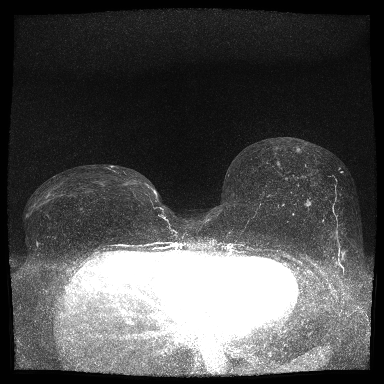

[Series 9: fl3d post-cm 3 · axial · 1.2mm · 0.94mm/px · z∈[-74,+97]mm · 6 of 144 slices shown (1 of 2)]
[im 1/144]
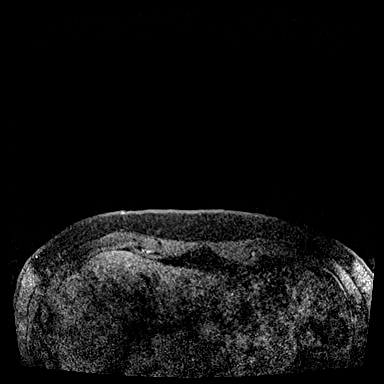
[im 29/144]
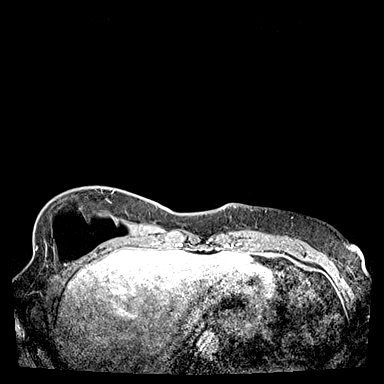
[im 58/144]
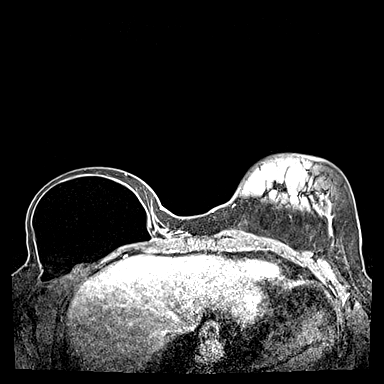
[im 86/144]
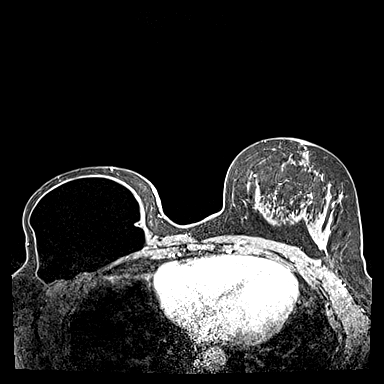
[im 115/144]
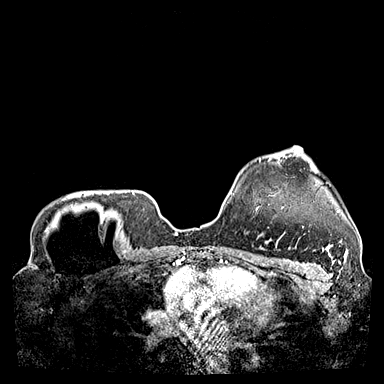
[im 144/144]
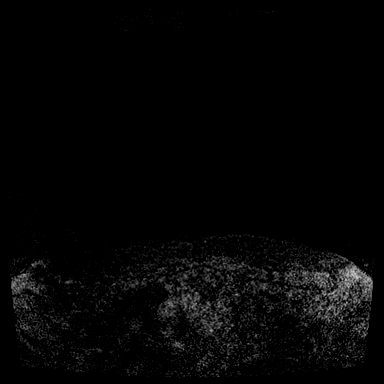

[Series 10: fl3d post-cm 3 · axial · 1.2mm · 0.94mm/px · z∈[-74,+62]mm · 5 of 144 slices shown (2 of 2)]
[im 1/144]
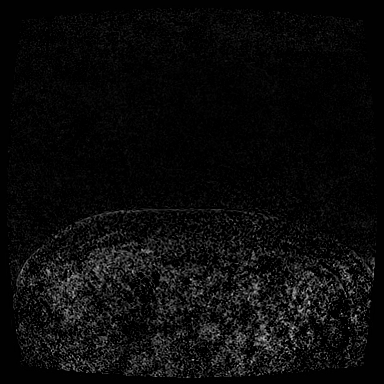
[im 29/144]
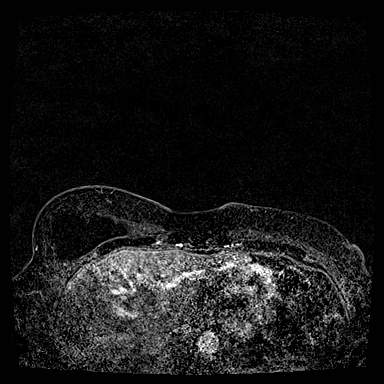
[im 58/144]
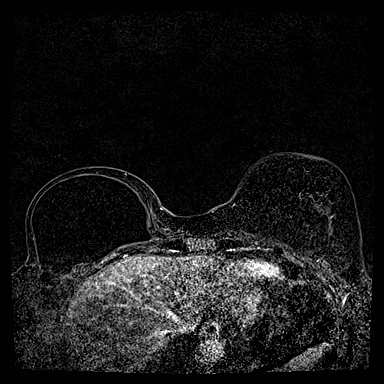
[im 86/144]
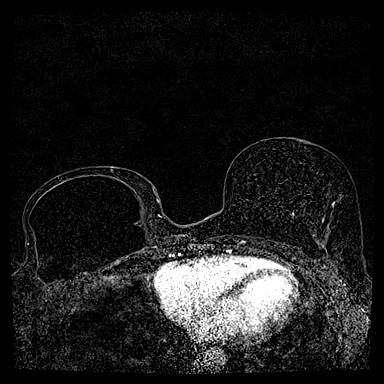
[im 115/144]
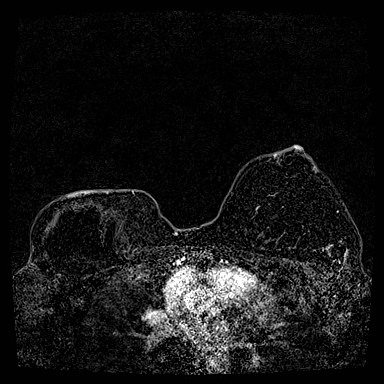

[33 of 48 positions shown; findings below may reference images not displayed]

Three-dimensional MR images were rendered by post-processing of the
original MR data on an independent workstation. The
three-dimensional MR images were interpreted, and findings are
reported in the following complete MRI report for this study. Three
dimensional images were evaluated at the independent interpreting
workstation using the DynaCAD thin client.
FINDINGS: Breast composition: c. Heterogeneous fibroglandular tissue.

Background parenchymal enhancement: Mild

Right breast: The patient is status post right mastectomy. No
evidence recurrence. The implant is intact.

Left breast: There is a new mass in the left breast at approximately
5 o'clock best seen on series 13, image 77 measuring up to 9 mm.
Post reduction changes identified on the left. Two stable
intramammary lymph nodes identified in the lateral left breast on
image 69.

Lymph nodes: No abnormal appearing lymph nodes.

Ancillary findings:  None.
IMPRESSION: 1. There is a new mass in the left breast at approximately 5 o'clock
on series 13, image 77 measuring up to 9 mm.
2. Post reduction changes on the left.
3. Status post right mastectomy. No evidence recurrence. The implant
is grossly intact.

RECOMMENDATION:
Recommend MRI guided biopsy of the 9 mm 5 o'clock left breast mass.

BI-RADS CATEGORY  4: Suspicious.

## 2022-08-14 ENCOUNTER — Encounter: Payer: Self-pay | Admitting: Bariatrics

## 2022-08-14 ENCOUNTER — Ambulatory Visit: Payer: 59 | Admitting: Bariatrics

## 2022-08-14 VITALS — BP 114/77 | HR 67 | Temp 98.6°F | Ht 68.0 in | Wt 241.0 lb

## 2022-08-14 DIAGNOSIS — Z6836 Body mass index (BMI) 36.0-36.9, adult: Secondary | ICD-10-CM

## 2022-08-14 DIAGNOSIS — E669 Obesity, unspecified: Secondary | ICD-10-CM | POA: Diagnosis not present

## 2022-08-14 DIAGNOSIS — R632 Polyphagia: Secondary | ICD-10-CM

## 2022-08-14 LAB — CYTOLOGY - PAP
Chlamydia: NEGATIVE
Comment: NEGATIVE
Comment: NEGATIVE
Comment: NEGATIVE
Comment: NORMAL
Diagnosis: NEGATIVE
High risk HPV: NEGATIVE
Neisseria Gonorrhea: NEGATIVE
Trichomonas: NEGATIVE

## 2022-08-14 NOTE — Progress Notes (Signed)
   WEIGHT SUMMARY AND BIOMETRICS  Weight Lost Since Last Visit: 4lb   Vitals Temp: 98.6 F (37 C) BP: 114/77 Pulse Rate: 67 SpO2: 98 %   Anthropometric Measurements Height: 5\' 8"  (1.727 m) Weight: 241 lb (109.3 kg) BMI (Calculated): 36.65 Weight at Last Visit: 245lb Weight Lost Since Last Visit: 4lb Starting Weight: 241lb Total Weight Loss (lbs): 0 lb (0 kg)   Body Composition  Body Fat %: 42.6 % Fat Mass (lbs): 102.8 lbs Muscle Mass (lbs): 131.4 lbs Total Body Water (lbs): 95 lbs Visceral Fat Rating : 11   Other Clinical Data Fasting: no Labs: no Today's Visit #: 11 Starting Date: 09/14/21    OBESITY Channie is here to discuss her progress with her obesity treatment plan along with follow-up of her obesity related diagnoses.     Nutrition Plan: the Category 2 plan - 50% adherence.  Current exercise: none  Interim History:  She is down 4 lbs since her last visit.  Meeting protein goals. and Water intake is inadequate.  Pharmacotherapy: Zahriyah  on Zepbound 5 mg but has been unable to get the medication due to [employer].  Adverse side effects: None Hunger is moderately controlled.  Cravings are well controlled.  Assessment/Plan:   Margaree Trinka endorses excessive hunger.  Medication(s): Zepbound Effects of medication:  moderately controlled. Cravings are moderately controlled.   Plan: Medication(s): Zepbound 5.0 mg SQ weekly continue as supply becomes available.  Will increase water, protein and fiber to help assuage hunger.  Will minimize foods that have a high glucose index/load to minimize reactive hypoglycemia.     Generalized Obesity: Current BMI BMI (Calculated): 36.65   Pharmacotherapy Plan Continue  Zepbound 5.0 mg SQ weekly  Nylani is currently in the action stage of change. As such, her goal is to continue with weight loss efforts.  She has agreed to the Category 2 plan.  Exercise goals: For substantial health  benefits, adults should do at least 150 minutes (2 hours and 30 minutes) a week of moderate-intensity, or 75 minutes (1 hour and 15 minutes) a week of vigorous-intensity aerobic physical activity, or an equivalent combination of moderate- and vigorous-intensity aerobic activity. Aerobic activity should be performed in episodes of at least 10 minutes, and preferably, it should be spread throughout the week.  Behavioral modification strategies: increasing lean protein intake, increase water intake, better snacking choices, and increasing fiber rich foods.  Agapita has agreed to follow-up with our clinic in 4 weeks.       Objective:   VITALS: Per patient if applicable, see vitals. GENERAL: Alert and in no acute distress. CARDIOPULMONARY: No increased WOB. Speaking in clear sentences.  PSYCH: Pleasant and cooperative. Speech normal rate and rhythm. Affect is appropriate. Insight and judgement are appropriate. Attention is focused, linear, and appropriate.  NEURO: Oriented as arrived to appointment on time with no prompting.   Attestation Statements:   This was prepared with the assistance of Engineer, civil (consulting).  Occasional wrong-word or sound-a-like substitutions may have occurred due to the inherent limitations of voice recognition software.   Corinna Capra, DO

## 2022-09-05 ENCOUNTER — Telehealth (INDEPENDENT_AMBULATORY_CARE_PROVIDER_SITE_OTHER): Payer: 59 | Admitting: Psychology

## 2022-09-05 DIAGNOSIS — F5089 Other specified eating disorder: Secondary | ICD-10-CM | POA: Diagnosis not present

## 2022-09-05 DIAGNOSIS — F4320 Adjustment disorder, unspecified: Secondary | ICD-10-CM | POA: Diagnosis not present

## 2022-09-05 NOTE — Progress Notes (Signed)
  Office: [phone]  /  Fax: [phone]    Date: September 05, 2022  Appointment Start Time: 4:02pm Duration: 17 minutes Provider: Lawerance Cruel, Psy.D. Type of Session: Individual Therapy  Location of Patient: Work (private location) Location of Provider: Provider's Home (private office) Type of Contact: Telepsychological Visit via MyChart Video Visit  Session Content: Gail Erickson is a 47 y.o. female presenting for a follow-up appointment to address the previously established treatment goal of increasing coping skills.Today's appointment was a telepsychological visit. Gail Erickson provided verbal consent for today's telepsychological appointment and she is aware she is responsible for securing confidentiality on her end of the session. Prior to proceeding with today's appointment, Gail Erickson's physical location at the time of this appointment was obtained as well a phone number she could be reached at in the event of technical difficulties. Gail Erickson and this provider participated in today's telepsychological service.   This provider conducted a brief check-in. Gail Erickson shared, "Things have been okay." She reported she has stopped consuming soda and continues to report a reduction in emotional eating behaviors, adding she has been more intentional about preparing and having access to foods congruent to her goals. Notably, she reported challenges establishing care with the primary therapist she recently reached out to, noting a plan to find someone new. Gail Erickson was provided with an additional referral option. A plan was developed to help Gail Erickson cope with emotional eating in the future using learned skills. She wrote down the following plan: focus on self-care; write down and break down tasks; focus on physical activity/challenges; focus on hydration; be prepared with snacks congruent to the meal plan; and pause to ask questions when triggered to eat (e.g., Am I really hungry?, Is there something bothering me?, and Will I feel  better if I eat?); and engage in discussed coping strategies after going through the aforementioned questions. Overall, Gail Erickson was receptive to today's appointment as evidenced by openness to sharing, responsiveness to feedback, and willingness to continue engaging in learned skills.  Mental Status Examination:  Appearance: neat Behavior: appropriate to circumstances Mood: neutral Affect: mood congruent Speech: WNL Eye Contact: appropriate Psychomotor Activity: WNL Gait: unable to assess Thought Process: linear, logical, and goal directed and no evidence or endorsement of suicidal, homicidal, and self-harm ideation, plan and intent  Thought Content/Perception: no hallucinations, delusions, bizarre thinking or behavior endorsed or observed Orientation: AAOx4 Memory/Concentration: intact Insight: good Judgment: good  Interventions:  Conducted a brief chart review Provided empathic reflections and validation Provided positive reinforcement Employed supportive psychotherapy interventions to facilitate reduced distress and to improve coping skills with identified stressors Reviewed learned skills  DSM-5 Diagnosis(es):  F50.89 Other Specified Feeding or Eating Disorder, Emotional Eating Behaviors and  F43.20 Adjustment Disorder, Unspecified   Treatment Goal & Progress: During the initial appointment with this provider, the following treatment goal was established: increase coping skills. Gail Erickson demonstrated progress in her goal as evidenced by increased awareness of hunger patterns, increased awareness of triggers for emotional eating behaviors, and reduction in emotional eating behaviors . Gail Erickson also continues to demonstrate willingness to engage in learned skill(s).  Plan: As previously planned, today was Gail Erickson's last appointment with this provider. She acknowledged understanding that she may request a follow-up appointment with this provider in the future as long as she is still established  with the clinic. She will establish care with a primary therapist. No further follow-up planned by this provider.

## 2022-09-12 ENCOUNTER — Other Ambulatory Visit (INDEPENDENT_AMBULATORY_CARE_PROVIDER_SITE_OTHER): Payer: Self-pay | Admitting: Physician Assistant

## 2022-09-12 DIAGNOSIS — Z6837 Body mass index (BMI) 37.0-37.9, adult: Secondary | ICD-10-CM

## 2022-09-12 DIAGNOSIS — R632 Polyphagia: Secondary | ICD-10-CM

## 2022-09-12 DIAGNOSIS — E669 Obesity, unspecified: Secondary | ICD-10-CM

## 2022-09-13 ENCOUNTER — Ambulatory Visit: Payer: 59 | Admitting: Bariatrics

## 2022-09-13 ENCOUNTER — Encounter: Payer: Self-pay | Admitting: Bariatrics

## 2022-09-13 DIAGNOSIS — E669 Obesity, unspecified: Secondary | ICD-10-CM | POA: Diagnosis not present

## 2022-09-13 DIAGNOSIS — E559 Vitamin D deficiency, unspecified: Secondary | ICD-10-CM | POA: Diagnosis not present

## 2022-09-13 DIAGNOSIS — R632 Polyphagia: Secondary | ICD-10-CM

## 2022-09-13 DIAGNOSIS — Z6836 Body mass index (BMI) 36.0-36.9, adult: Secondary | ICD-10-CM

## 2022-09-13 DIAGNOSIS — Z6837 Body mass index (BMI) 37.0-37.9, adult: Secondary | ICD-10-CM

## 2022-09-13 MED ORDER — ZEPBOUND 7.5 MG/0.5ML ~~LOC~~ SOAJ: 7.5000 mg | SUBCUTANEOUS | 0 refills | Status: DC

## 2022-09-13 MED ORDER — VITAMIN D (ERGOCALCIFEROL) 1.25 MG (50000 UNIT) PO CAPS: 50000.0000 [IU] | ORAL_CAPSULE | ORAL | 0 refills | Status: DC

## 2022-09-13 NOTE — Progress Notes (Signed)
WEIGHT SUMMARY AND BIOMETRICS  Weight Lost Since Last Visit: 2lb   Vitals Temp: 97.8 F (36.6 C) BP: 111/73 Pulse Rate: 70 SpO2: 100 %   Anthropometric Measurements Height: 5\' 8"  (1.727 m) Weight: 239 lb (108.4 kg) BMI (Calculated): 36.35 Weight at Last Visit: 241lb Weight Lost Since Last Visit: 2lb Starting Weight: 241lb Total Weight Loss (lbs): 2 lb (0.907 kg)   Body Composition  Body Fat %: 42 % Fat Mass (lbs): 100.4 lbs Muscle Mass (lbs): 131.8 lbs Total Body Water (lbs): 94.8 lbs Visceral Fat Rating : 11   Other Clinical Data Fasting: no Labs: no Today's Visit #: 12 Starting Date: 09/14/21    OBESITY Gail Erickson is here to discuss her progress with her obesity treatment plan along with follow-up of her obesity related diagnoses.     Nutrition Plan: the Category 2 plan - 40% adherence.  Current exercise: walking  Interim History:  She is down 2 lbs since her last visit.  Not journaling consistently., Meeting calorie goals., and Water intake is inadequate.  Pharmacotherapy: Gail Erickson is on Gail Erickson 5.0 mg SQ weekly Adverse side effects: None Hunger is moderately controlled.  Cravings are moderately controlled.  Assessment/Plan:   1. Vitamin D deficiency Vitamin D Deficiency Vitamin D is not at goal of 50.  Most recent vitamin D level was 31. She is on  prescription ergocalciferol 50,000 IU weekly. Lab Results  Component Value Date   VD25OH 31.2 05/17/2022   VD25OH 27.3 (L) 09/14/2021   VD25OH 23 (L) 07/28/2021    Plan: Refill prescription vitamin D 50,000 IU weekly.   2. Polyphagia Polyphagia Gail Erickson endorses excessive hunger.  Medication(s): Gail Erickson Effects of medication:  moderately controlled. Cravings are moderately controlled.   Plan: Medication(s): Gail Erickson 7.5 mg SQ weekly Will increase water, protein and fiber to help assuage hunger.  Will minimize foods that have a high glucose index/load to minimize reactive  hypoglycemia.  Will be more adherent to the plan 80 to 90 %.  Will resume journaling.   Generalized Obesity: Current BMI BMI (Calculated): 36.35   Pharmacotherapy Plan Continue and increase  Gail Erickson 7.5 mg SQ weekly  Gail Erickson is currently in the action stage of change. As such, her goal is to continue with weight loss efforts.  She has agreed to the Category 2 plan.  Exercise goals: For substantial health benefits, adults should do at least 150 minutes (2 hours and 30 minutes) a week of moderate-intensity, or 75 minutes (1 hour and 15 minutes) a week of vigorous-intensity aerobic physical activity, or an equivalent combination of moderate- and vigorous-intensity aerobic activity. Aerobic activity should be performed in episodes of at least 10 minutes, and preferably, it should be spread throughout the week.  Behavioral modification strategies: increasing lean protein intake, meal planning , increase water intake, better snacking choices, planning for success, increasing vegetables, increasing lower sugar fruits, and increasing fiber rich foods.  Gail Erickson has agreed to follow-up with our clinic in 4 weeks.      Objective:   VITALS: Per patient if applicable, see vitals. GENERAL: Alert and in no acute distress. CARDIOPULMONARY: No increased WOB. Speaking in clear sentences.  PSYCH: Pleasant and cooperative. Speech normal rate and rhythm. Affect is appropriate. Insight and judgement are appropriate. Attention is focused, linear, and appropriate.  NEURO: Oriented as arrived to appointment on time with no prompting.   Attestation Statements:   This was prepared with the assistance of Engineer, civil (consulting).  Occasional wrong-word or sound-a-like substitutions may have occurred  due to the inherent limitations of voice recognition software.   Corinna Capra, DO

## 2022-09-20 ENCOUNTER — Telehealth: Payer: Self-pay | Admitting: Hematology

## 2022-09-22 ENCOUNTER — Inpatient Hospital Stay: Payer: 59

## 2022-09-22 ENCOUNTER — Inpatient Hospital Stay: Payer: 59 | Admitting: Hematology

## 2022-09-25 ENCOUNTER — Other Ambulatory Visit: Payer: 59

## 2022-09-25 ENCOUNTER — Ambulatory Visit: Payer: 59 | Admitting: Hematology

## 2022-10-12 ENCOUNTER — Ambulatory Visit (INDEPENDENT_AMBULATORY_CARE_PROVIDER_SITE_OTHER): Payer: 59 | Admitting: Bariatrics

## 2022-10-12 VITALS — BP 122/78 | HR 73 | Temp 98.4°F | Ht 68.0 in | Wt 237.0 lb

## 2022-10-12 DIAGNOSIS — R632 Polyphagia: Secondary | ICD-10-CM | POA: Diagnosis not present

## 2022-10-12 DIAGNOSIS — Z6836 Body mass index (BMI) 36.0-36.9, adult: Secondary | ICD-10-CM

## 2022-10-12 DIAGNOSIS — E669 Obesity, unspecified: Secondary | ICD-10-CM

## 2022-10-12 DIAGNOSIS — E559 Vitamin D deficiency, unspecified: Secondary | ICD-10-CM | POA: Diagnosis not present

## 2022-10-12 MED ORDER — VITAMIN D (ERGOCALCIFEROL) 1.25 MG (50000 UNIT) PO CAPS
50000.0000 [IU] | ORAL_CAPSULE | ORAL | 0 refills | Status: DC
Start: 2022-10-12 — End: 2022-11-29

## 2022-10-12 MED ORDER — ZEPBOUND 7.5 MG/0.5ML ~~LOC~~ SOAJ: 7.5000 mg | SUBCUTANEOUS | 0 refills | Status: DC

## 2022-10-12 NOTE — Progress Notes (Signed)
   WEIGHT SUMMARY AND BIOMETRICS  Weight Lost Since Last Visit: 2lb  Vitals Temp: 98.4 F (36.9 C) BP: 122/78 Pulse Rate: 73 SpO2: 100 %   Anthropometric Measurements Height: 5\' 8"  (1.727 m) Weight: 237 lb (107.5 kg) BMI (Calculated): 36.04 Weight at Last Visit: 239lb Weight Lost Since Last Visit: 2lb Starting Weight: 241lb Total Weight Loss (lbs): 4 lb (1.814 kg)   Body Composition  Body Fat %: 40.4 % Fat Mass (lbs): 95.8 lbs Muscle Mass (lbs): 134 lbs Total Body Water (lbs): 95.2 lbs Visceral Fat Rating : 10   Other Clinical Data Fasting: no Labs: no Today's Visit #: 13 Starting Date: 09/14/21    OBESITY Gail Erickson is here to discuss her progress with her obesity treatment plan along with follow-up of her obesity related diagnoses.     Nutrition Plan: the Category 2 plan - 0% adherence.  Current exercise: none  Interim History:  She is down 2 lbs since her last visit. She has not been counting calories or protein.n.  Protein intake is as prescribed, Is not skipping meals, and Water intake is adequate.  Pharmacotherapy: Gail Erickson is on Zepbound 7.5 mg SQ weekly Adverse side effects: None Hunger is moderately controlled.  Cravings are moderately controlled.  Assessment/Plan:   1. Vitamin D deficiency Vitamin D Deficiency Vitamin D is not at goal of 50.  Most recent vitamin D level was 31.2. She is on  prescription ergocalciferol 50,000 IU weekly. Lab Results  Component Value Date   VD25OH 31.2 05/17/2022   VD25OH 27.3 (L) 09/14/2021   VD25OH 23 (L) 07/28/2021    Plan: Refill prescription vitamin D 50,000 IU weekly.      Generalized Obesity: Current BMI BMI (Calculated): 36.04   Pharmacotherapy Plan Continue and refill  Zepbound 7.5 mg SQ weekly  Gail Erickson is currently in the action stage of change. As such, her goal is to continue with weight loss efforts.  She has agreed to the Category 2 plan.  Exercise goals: All adults should avoid  inactivity. Some physical activity is better than none, and adults who participate in any amount of physical activity gain some health benefits.  Behavioral modification strategies: increasing lean protein intake, no meal skipping, keep healthy foods in the home, and increase frequency of journaling.  Gail Erickson has agreed to follow-up with our clinic in 4 weeks.      Objective:   VITALS: Per patient if applicable, see vitals. GENERAL: Alert and in no acute distress. CARDIOPULMONARY: No increased WOB. Speaking in clear sentences.  PSYCH: Pleasant and cooperative. Speech normal rate and rhythm. Affect is appropriate. Insight and judgement are appropriate. Attention is focused, linear, and appropriate.  NEURO: Oriented as arrived to appointment on time with no prompting.   Attestation Statements:    This was prepared with the assistance of Engineer, civil (consulting).  Occasional wrong-word or sound-a-like substitutions may have occurred due to the inherent limitations of voice recognition software.   Corinna Capra, DO

## 2022-10-19 ENCOUNTER — Other Ambulatory Visit: Payer: Self-pay | Admitting: Bariatrics

## 2022-11-16 ENCOUNTER — Ambulatory Visit: Payer: 59 | Admitting: Bariatrics

## 2022-11-19 ENCOUNTER — Other Ambulatory Visit: Payer: Self-pay | Admitting: Bariatrics

## 2022-11-28 ENCOUNTER — Ambulatory Visit: Payer: 59 | Admitting: Bariatrics

## 2022-11-29 ENCOUNTER — Ambulatory Visit: Payer: 59 | Admitting: Bariatrics

## 2022-11-29 ENCOUNTER — Encounter: Payer: Self-pay | Admitting: Bariatrics

## 2022-11-29 VITALS — BP 121/82 | HR 80 | Temp 98.7°F | Ht 68.0 in | Wt 238.0 lb

## 2022-11-29 DIAGNOSIS — Z6836 Body mass index (BMI) 36.0-36.9, adult: Secondary | ICD-10-CM | POA: Diagnosis not present

## 2022-11-29 DIAGNOSIS — E669 Obesity, unspecified: Secondary | ICD-10-CM | POA: Diagnosis not present

## 2022-11-29 DIAGNOSIS — E559 Vitamin D deficiency, unspecified: Secondary | ICD-10-CM | POA: Diagnosis not present

## 2022-11-29 DIAGNOSIS — R632 Polyphagia: Secondary | ICD-10-CM | POA: Diagnosis not present

## 2022-11-29 MED ORDER — VITAMIN D (ERGOCALCIFEROL) 1.25 MG (50000 UNIT) PO CAPS
50000.0000 [IU] | ORAL_CAPSULE | ORAL | 0 refills | Status: DC
Start: 2022-11-29 — End: 2022-12-28

## 2022-11-29 MED ORDER — ZEPBOUND 7.5 MG/0.5ML ~~LOC~~ SOAJ: 7.5000 mg | SUBCUTANEOUS | 0 refills | Status: DC

## 2022-11-29 NOTE — Progress Notes (Signed)
WEIGHT SUMMARY AND BIOMETRICS  Weight Gained Since Last Visit: 1lb   Vitals Temp: 98.7 F (37.1 C) BP: 121/82 Pulse Rate: 80 SpO2: 98 %   Anthropometric Measurements Height: 5\' 8"  (1.727 m) Weight: 238 lb (108 kg) BMI (Calculated): 36.2 Weight at Last Visit: 237lb Weight Gained Since Last Visit: 1lb Starting Weight: 241lb Total Weight Loss (lbs): 3 lb (1.361 kg)   Body Composition  Body Fat %: 43 % Fat Mass (lbs): 102.4 lbs Muscle Mass (lbs): 129 lbs Total Body Water (lbs): 95 lbs Visceral Fat Rating : 11   Other Clinical Data Fasting: no Labs: no Today's Visit #: 14 Starting Date: 09/14/21    OBESITY Shannice is here to discuss her progress with her obesity treatment plan along with follow-up of her obesity related diagnoses.     Nutrition Plan: the Category 2 plan - 0% adherence.  Current exercise: none  Interim History:  She is down 1 lb since her last visit. She is a caregiver.  Eating all of the food on the plan., Protein intake is as prescribed, and Water intake is inadequate.  Pharmacotherapy: Thierry is on Zepbound 7.5 mg SQ weekly Adverse side effects: Constipation Hunger is moderately controlled.  Cravings are moderately controlled.  Assessment/Plan:   Quinnleigh Nakada endorses excessive hunger.  Medication(s): Zepbound Effects of medication:  moderately controlled. Cravings are moderately controlled.   Plan: Medication(s): Zepbound 7.5 mg SQ weekly Will increase water, protein and fiber to help assuage hunger.  Will minimize foods that have a high glucose index/load to minimize reactive hypoglycemia.  Will increase her vegetables and greens..  Will increase her meal preparation.  Will begin a probiotic to help with her bowels.   Vitamin D Deficiency Vitamin D is at goal of 50.  Most recent vitamin D level was 31.2. She is on  prescription ergocalciferol 50,000 IU weekly. Lab Results  Component Value Date   VD25OH 31.2  05/17/2022   VD25OH 27.3 (L) 09/14/2021   VD25OH 23 (L) 07/28/2021    Plan: Refill prescription vitamin D 50,000 IU weekly.    Generalized Obesity: Current BMI BMI (Calculated): 36.2   Pharmacotherapy Plan Continue and refill  Zepbound 7.5 mg week  Yovanka is currently in the action stage of change. As such, her goal is to continue with weight loss efforts.  She has agreZepbound 7.5 mg SQ weeklyed to the Category 2 plan.  Exercise goals: For substantial health benefits, adults should do at least 150 minutes (2 hours and 30 minutes) a week of moderate-intensity, or 75 minutes (1 hour and 15 minutes) a week of vigorous-intensity aerobic physical activity, or an equivalent combination of moderate- and vigorous-intensity aerobic activity. Aerobic activity should be performed in episodes of at least 10 minutes, and preferably, it should be spread throughout the week.  Behavioral modification strategies: increasing lean protein intake, decreasing simple carbohydrates , no meal skipping, increase water intake, better snacking choices, planning for success, keep a strict food journal, and mindful eating.  Lorain has agreed to follow-up with our clinic in 4 weeks.       Objective:   VITALS: Per patient if applicable, see vitals. GENERAL: Alert and in no acute distress. CARDIOPULMONARY: No increased WOB. Speaking in clear sentences.  PSYCH: Pleasant and cooperative. Speech normal rate and rhythm. Affect is appropriate. Insight and judgement are appropriate. Attention is focused, linear, and appropriate.  NEURO: Oriented as arrived to appointment on time with no prompting.   Attestation Statements:    This  was prepared with the assistance of Engineer, civil (consulting).  Occasional wrong-word or sound-a-like substitutions may have occurred due to the inherent limitations of voice recognition software. Corinna Capra, DO

## 2022-12-21 ENCOUNTER — Ambulatory Visit: Payer: 59 | Admitting: Podiatry

## 2022-12-23 ENCOUNTER — Other Ambulatory Visit: Payer: Self-pay | Admitting: Hematology

## 2022-12-23 DIAGNOSIS — C50811 Malignant neoplasm of overlapping sites of right female breast: Secondary | ICD-10-CM

## 2022-12-28 ENCOUNTER — Other Ambulatory Visit: Payer: Self-pay | Admitting: Bariatrics

## 2022-12-28 ENCOUNTER — Encounter: Payer: Self-pay | Admitting: Bariatrics

## 2022-12-28 ENCOUNTER — Ambulatory Visit: Payer: 59 | Admitting: Bariatrics

## 2022-12-28 VITALS — BP 134/82 | HR 73 | Temp 97.9°F | Ht 68.0 in | Wt 238.0 lb

## 2022-12-28 DIAGNOSIS — E669 Obesity, unspecified: Secondary | ICD-10-CM | POA: Diagnosis not present

## 2022-12-28 DIAGNOSIS — E559 Vitamin D deficiency, unspecified: Secondary | ICD-10-CM

## 2022-12-28 DIAGNOSIS — Z6836 Body mass index (BMI) 36.0-36.9, adult: Secondary | ICD-10-CM | POA: Diagnosis not present

## 2022-12-28 DIAGNOSIS — R632 Polyphagia: Secondary | ICD-10-CM

## 2022-12-28 DIAGNOSIS — E66812 Obesity, class 2: Secondary | ICD-10-CM

## 2022-12-28 MED ORDER — VITAMIN D (ERGOCALCIFEROL) 1.25 MG (50000 UNIT) PO CAPS
50000.0000 [IU] | ORAL_CAPSULE | ORAL | 0 refills | Status: DC
Start: 2022-12-28 — End: 2023-01-29

## 2022-12-28 MED ORDER — ZEPBOUND 7.5 MG/0.5ML ~~LOC~~ SOAJ: 7.5000 mg | SUBCUTANEOUS | 0 refills | Status: DC

## 2022-12-28 NOTE — Progress Notes (Signed)
WEIGHT SUMMARY AND BIOMETRICS  Weight Lost Since Last Visit: 0lb  Weight Gained Since Last Visit: 0lb   Vitals Temp: 97.9 F (36.6 C) BP: 134/82 Pulse Rate: 73 SpO2: 100 %   Anthropometric Measurements Height: 5\' 8"  (1.727 m) Weight: 238 lb (108 kg) BMI (Calculated): 36.2 Weight at Last Visit: 238lb Weight Lost Since Last Visit: 0lb Weight Gained Since Last Visit: 0lb Starting Weight: 241lnb Total Weight Loss (lbs): 3 lb (1.361 kg)   Body Composition  Body Fat %: 42.1 % Fat Mass (lbs): 100.4 lbs Muscle Mass (lbs): 131 lbs Total Body Water (lbs): 89.2 lbs Visceral Fat Rating : 11   Other Clinical Data Fasting: No Labs: No Today's Visit #: 15 Starting Date: 09/14/21    OBESITY Tuyen is here to discuss her progress with her obesity treatment plan along with follow-up of her obesity related diagnoses.     Nutrition Plan: the Category 2 plan - 50% adherence.  Current exercise: none  Interim History:  Her weight stays the same.  Eating all of the food on the plan., Protein intake is as prescribed, Journaling consistently., and Water intake is adequate.  Pharmacotherapy: Karol is on Zepbound 7.5 mg SQ weekly Adverse side effects: Constipation Hunger is moderately controlled.  Cravings are moderately controlled.  Assessment/Plan:   Vitamin D Deficiency Vitamin D is not at goal of 50.  Most recent vitamin D level was 31.2. She is on  prescription ergocalciferol 50,000 IU weekly. Lab Results  Component Value Date   VD25OH 31.2 05/17/2022   VD25OH 27.3 (L) 09/14/2021   VD25OH 23 (L) 07/28/2021    Plan: Refill prescription vitamin D 50,000 IU weekly.   Polyphagia Liyana endorses excessive hunger.  Medication(s): Zepbound Effects of medication:  moderately controlled. Cravings are moderately controlled.   Plan: Medication(s): Zepbound 7.5 mg SQ weekly She will journal more consistently.  She will increase her water.  Will increase  water, protein and fiber to help assuage hunger.  Will minimize foods that have a high glucose index/load to minimize reactive hypoglycemia.   Generalized Obesity: Current BMI BMI (Calculated): 36.2   Pharmacotherapy Plan Continue and refill  Zepbound 7.5 mg SQ weekly  Patria is currently in the action stage of change. As such, her goal is to continue with weight loss efforts.  She has agreed to the Category 2 plan.  Exercise goals: For additional and more extensive health benefits, adults should increase their aerobic physical activity to 300 minutes (5 hours) a week of moderate-intensity, or 150 minutes a week of vigorous-intensity aerobic physical activity, or an equivalent combination of moderate- and vigorous-intensity activity. Additional health benefits are gained by engaging in physical activity beyond this amount.   Behavioral modification strategies: increasing lean protein intake, increase water intake, better snacking choices, planning for success, increasing fiber rich foods, keep healthy foods in the home, increase frequency of journaling, and mindful eating.  Kryssa has agreed to follow-up with our clinic in 4 weeks.      Objective:   VITALS: Per patient if applicable, see vitals. GENERAL: Alert and in no acute distress. CARDIOPULMONARY: No increased WOB. Speaking in clear sentences.  PSYCH: Pleasant and cooperative. Speech normal rate and rhythm. Affect is appropriate. Insight and judgement are appropriate. Attention is focused, linear, and appropriate.  NEURO: Oriented as arrived to appointment on time with no prompting.   Attestation Statements:    This was prepared with the assistance of Engineer, civil (consulting).  Occasional wrong-word or sound-a-like substitutions may  have occurred due to the inherent limitations of voice recognition software.   Corinna Capra, DO

## 2023-01-02 ENCOUNTER — Ambulatory Visit: Payer: 59 | Admitting: Podiatry

## 2023-01-02 ENCOUNTER — Encounter: Payer: Self-pay | Admitting: Podiatry

## 2023-01-02 DIAGNOSIS — B351 Tinea unguium: Secondary | ICD-10-CM | POA: Diagnosis not present

## 2023-01-02 MED ORDER — EFINACONAZOLE 10 % EX SOLN: 1.0000 [drp] | Freq: Every day | CUTANEOUS | 11 refills | Status: AC

## 2023-01-03 ENCOUNTER — Other Ambulatory Visit: Payer: Self-pay | Admitting: Hematology

## 2023-01-03 DIAGNOSIS — Z1231 Encounter for screening mammogram for malignant neoplasm of breast: Secondary | ICD-10-CM

## 2023-01-03 NOTE — Progress Notes (Signed)
Subjective:  Patient ID: Gail Erickson, female    DOB: [birth year],  MRN: [id number]  Chief Complaint  Patient presents with   Routine Post Op    F/U PATIENT HAS HAD A LITTLE PAIN IN RIGHT HALLUXS . EVERYTHING HAS BEEN FINE OTHER THEN THAT AND NO ISSUES     47 y.o. female presents with the above complaint. History confirmed with patient.  She took the Lamisil again but fairly inconsistently and took probably two thirds of the course  Objective:  Physical Exam: warm, good capillary refill, no trophic changes or ulcerative lesions, normal DP and PT pulses, normal sensory exam, and there is now proximal clearing and improvement in discoloration of the nail she does have some nail polish so hard to fully evaluate dystrophy remains on the second toes       Assessment:   1. Onychomycosis      Plan:  Patient was evaluated and treated and all questions answered.  Doing much better and has improved I think even with I did complete course of Lamisil she has had some improvement.  Would like to transition her to a topical medication and we discussed Jublia as an option.  Rx sent to pharmacy.  We also discussed the second toenail has quite a bit of dystrophy and may not fully resolve and this is due to mechanical changes within the nail as opposed to fungal infection.  I will see her back in 6 months to reevaluate  Return in about 6 months (around 07/03/2023) for follow up after nail fungus treatment.

## 2023-01-12 ENCOUNTER — Other Ambulatory Visit: Payer: Self-pay

## 2023-01-12 DIAGNOSIS — C50811 Malignant neoplasm of overlapping sites of right female breast: Secondary | ICD-10-CM

## 2023-01-12 DIAGNOSIS — D649 Anemia, unspecified: Secondary | ICD-10-CM

## 2023-01-13 NOTE — Progress Notes (Unsigned)
Patient Care Team: Gilmore Laroche, FNP as PCP - General (Family Medicine) Ardell Isaacs, Forrestine Him, MD as Consulting Physician (Obstetrics and Gynecology) Griselda Miner, MD as Consulting Physician (General Surgery) Malachy Mood, MD as Consulting Physician (Hematology) Pershing Proud, RN as Oncology Nurse Navigator Donnelly Angelica, RN as Oncology Nurse Navigator Pollyann Samples, NP as Nurse Practitioner (Nurse Practitioner) Cherly Anderson as Consulting Physician (Endocrinology) [facility] as Consulting Physician (Radiology)   CHIEF COMPLAINT: Follow up right breast cancer   Oncology History Overview Note  Cancer Staging Malignant neoplasm of overlapping sites of right breast [facility]) Staging form: Breast, AJCC 8th Edition - Clinical stage from 02/25/2020: Stage IIB (cT3, cN0, cM0, G3, ER+, PR+, HER2-) - Signed by Malachy Mood, MD on 02/25/2020 - Pathologic stage from 04/28/2020: Stage IA (pT1a, pN0, cM0, G2, ER+, PR+, HER2-) - Signed by Malachy Mood, MD on 05/06/2020 Histologic grading system: 3 grade system    Malignant neoplasm of overlapping sites of right breast (HCC)  01/30/2020 Mammogram   Mammogram 01/30/20  IMPRESSION: 1. 7.3 x 5.7 x 3.0 cm area of indeterminate calcifications in medial right breast, involving the lower inner and upper inner quadrants, these extend from the retroareolar region posteriorly, in a segmental distribution, suspicious for DCIS. 2. Two probably benign left breast masses and 2 probably benign left axillary lymph nodes with borderline cortical thickening. 3. Benign right breast cyst.   02/13/2020 Initial Biopsy   Diagnosis 02/13/20 1. Breast, right, needle core biopsy, posterior extent - DUCTAL CARCINOMA IN SITU WITH CALCIFICATIONS AND NECROSIS - SEE COMMENT 2. Breast, right, needle core biopsy, mid-anterior extent - INVASIVE DUCTAL CARCINOMA - DUCTAL CARCINOMA IN SITU - SEE COMMENT Microscopic Comment 1. Based on the  biopsy, the ductal carcinoma in situ has a solid pattern, high nuclear grade and measures 0.5 cm in greatest linear extent. Prognostic markers (ER/PR) are pending and will be reported in an addendum. Dr. Rayetta Pigg reviewed the case and agrees with the above diagnosis. These results were called to [facility] on February 16, 2020. 2. Based on the biopsy, the carcinoma appears Nottingham grade 3 of 3 and measures 0.15 cm in greatest linear extent. Prognostic markers (ER/PR/ki-67/HER2) are pending and will be reported in an addendum.   02/13/2020 Receptors her2    1. PROGNOSTIC INDICATORS Results: IMMUNOHISTOCHEMICAL AND MORPHOMETRIC ANALYSIS PERFORMED MANUALLY Estrogen Receptor: >95%, POSITIVE, MODERATE-STRONG STAINING INTENSITY Progesterone Receptor: 5%, POSITIVE, MODERATE-STRONG STAINING INTENSITY  2. PROGNOSTIC INDICATORS Results: IMMUNOHISTOCHEMICAL AND MORPHOMETRIC ANALYSIS PERFORMED MANUALLY The tumor cells are NEGATIVE for Her2 (0). Estrogen Receptor: >95%, POSITIVE, STRONG STAINING INTENSITY Progesterone Receptor: 60%, POSITIVE, STRONG STAINING INTENSITY Proliferation Marker Ki67: 1%   02/21/2020 Breast MRI   Breast MRI 02/21/20  IMPRESSION: 1. Involving the sites of biopsy proven DCIS in the medial right breast there is a large area of ill-defined non mass enhancement middle to anterior depth, somewhat difficult to measure due to irregular margins and background enhancement but measures at least 6.3 x 3.3 x 2.0 cm. 2. No MRI evidence of malignancy in the left breast. 3. No axillary adenopathy.   02/25/2020 Initial Diagnosis   Malignant neoplasm of overlapping sites of right breast (HCC)   02/25/2020 Cancer Staging   Staging form: Breast, AJCC 8th Edition - Clinical stage from 02/25/2020: Stage IIB (cT3, cN0, cM0, G3, ER+, PR+, HER2-) - Signed by Malachy Mood, MD on 02/25/2020   02/27/2020 Pathology Results   Diagnosis Lymph node, needle/core biopsy,  axillary - NO CARCINOMA IDENTIFIED IN NODAL TISSUE Microscopic Comment These results were called to [facility] on March 09, 2020.   04/28/2020 Surgery   RIGHT MASTECTOMY WITH SENTINEL LYMPH NODE BIOPSY by DR Carolynne Edouard  IMMEDIATE RIGHT BREAST RECONSTRUCTION WITH PLACEMENT OF TISSUE EXPANDER AND FLEX HD (ACELLULAR HYDRATED DERMIS) by Dr Ulice Bold   04/28/2020 Pathology Results   FINAL MICROSCOPIC DIAGNOSIS:   A. 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 LYMPH NODE, RIGHT AXILLARY, SENTINEL, EXCISION:  -  No carcinoma identified in one lymph node (0/1)    04/28/2020 Cancer Staging   Staging form: Breast, AJCC 8th Edition - Pathologic stage from 04/28/2020: Stage IA (pT1a, pN0, cM0, G2, ER+, PR+, HER2-) - Signed by Malachy Mood, MD on 05/06/2020 Histologic grading system: 3 grade system   07/10/2020 -  Anti-estrogen oral therapy   Tamoxifen 20mg  once daily starting 07/10/20   11/04/2020 Survivorship   SCP delivered by [name], NP      CURRENT THERAPY:  Tamoxifen, starting 06/2020  INTERVAL HISTORY Ms. Magistro returns for follow up as scheduled. Last seen by me 03/2022. Screening MRI 08/08/22 was negative. Continues tamoxifen, tolerating well with mild joint aches and vaginal dryness. Periods remain normal, lasting 4-5 days heavy on day 2. Denies other bleeding. For 2 months or more she has intermittent numbness/tingling in her hands. She is not sleeping on stomach since mastectomy and has occasional neck/left should pain after sleeping. Denies known arthritis.   ROS  All other systems reviewed and negative  Past Medical History:  Diagnosis Date   Abnormal Pap smear of cervix 06/02/2014   LGSIL:neg HR  HPV (1st abn. pap)   Back pain    Breast cancer (HCC) 2022   Cancer (HCC)    Cyst of right breast    --stable in 2015 U/S done in [place], [place]   Elevated DHEA 05/21/2015   level = 343   Family history of breast cancer    High cholesterol    HSV-1 (herpes simplex virus 1) infection    oral HSV   Joint pain    Migraine without aura    PCOS (polycystic ovarian syndrome)    Prediabetes 2021   sees Dr.Balan   Swelling of lower extremity    Vitamin D deficiency 05/21/2015   level = 10     Past Surgical History:  Procedure Laterality Date   BREAST EXCISIONAL BIOPSY Left 10/21/2021   BREAST LUMPECTOMY WITH RADIOACTIVE SEED LOCALIZATION Left 10/21/2021   Procedure: LEFT BREAST RADIOACTIVE SEED LOCALIZED LUMPECTOMY;  Surgeon: Griselda Miner, MD;  Location: [facility];  Service: General;  Laterality: Left;   BREAST RECONSTRUCTION WITH PLACEMENT OF TISSUE EXPANDER AND FLEX HD (ACELLULAR HYDRATED DERMIS) Right 04/28/2020   Procedure: IMMEDIATE RIGHT BREAST RECONSTRUCTION WITH PLACEMENT OF TISSUE EXPANDER AND FLEX HD (ACELLULAR HYDRATED DERMIS);  Surgeon: Peggye Form, DO;  Location: [facility];  Service: Plastics;  Laterality: Right;   COLONOSCOPY WITH PROPOFOL N/A 06/07/2021   Procedure: COLONOSCOPY WITH PROPOFOL;  Surgeon: Lanelle Bal, DO;  [facility];  Service: Endoscopy;  Laterality: N/A;  10:30 / ASA 2   COLPOSCOPY  LIPOSUCTION WITH LIPOFILLING Right 02/17/2021   Procedure: Lipofilling of right breast for symmetry;  Surgeon: Peggye Form, DO;  Location: [facility];  Service: Plastics;  Laterality: Right;  1.5 hour   MASTECTOMY Right 04/28/2020   MASTECTOMY W/ SENTINEL NODE BIOPSY Right 04/28/2020   Procedure: RIGHT MASTECTOMY WITH SENTINEL LYMPH NODE BIOPSY;  Surgeon: Griselda Miner, MD;  Location: [facility];  Service: General;  Laterality: Right;  PECTORAL BLOCK   MASTOPEXY Left 09/23/2020    Procedure: LEFT BREAST MASTOPEXY;  Surgeon: Peggye Form, DO;  Location: [facility];  Service: Plastics;  Laterality: Left;   POLYPECTOMY  06/07/2021   Procedure: POLYPECTOMY;  Surgeon: Lanelle Bal, DO;  [facility];  Service: Endoscopy;;   REDUCTION MAMMAPLASTY Left 04/28/2020   REMOVAL OF TISSUE EXPANDER AND PLACEMENT OF IMPLANT Right 09/23/2020   Procedure: REMOVAL OF TISSUE EXPANDER AND PLACEMENT OF IMPLANT;  Surgeon: Peggye Form, DO;  Location: [facility];  Service: Plastics;  Laterality: Right;  90 min   UMBILICAL HERNIA REPAIR     --as a child     Outpatient Encounter Medications as of 01/15/2023  Medication Sig   Cholecalciferol (VITAMIN D3) 25 MCG (1000 UT) CAPS Take 1 capsule (1,000 Units total) by mouth daily.   Efinaconazole 10 % SOLN Apply 1 drop topically daily.   tamoxifen (NOLVADEX) 20 MG tablet TAKE 1 TABLET(20 MG) BY MOUTH DAILY   tirzepatide (ZEPBOUND) 7.5 MG/0.5ML Pen Inject 7.5 mg into the skin once a week.   Vitamin D, Ergocalciferol, (DRISDOL) 1.25 MG (50000 UNIT) CAPS capsule Take 1 capsule (50,000 Units total) by mouth every 7 (seven) days.   Aspirin-Acetaminophen-Caffeine (EXCEDRIN MIGRAINE PO) Take 1 tablet by mouth daily as needed (headache). (Patient not taking: Reported on 01/15/2023)   Cetirizine HCl (ZYRTEC PO) Take by mouth. (Patient not taking: Reported on 01/15/2023)   famciclovir (FAMVIR) 500 MG tablet Take 1500mg  singe dose as needed for cold sore (Patient not taking: Reported on 01/15/2023)   Multiple Vitamin (MULTIVITAMIN) tablet Take 1 tablet by mouth every 14 (fourteen) days. (Patient not taking: Reported on 01/15/2023)   Omega-3 Fatty Acids (FISH OIL) 1000 MG CAPS Take by mouth every 14 (fourteen) days. (Patient not taking: Reported on 01/15/2023)   No facility-administered encounter medications on file as of 01/15/2023.     Today's Vitals   01/15/23 0852 01/15/23 0857  BP: 121/76    Pulse: 66   Resp: 13   Temp: 98.3 F (36.8 C)   TempSrc: Oral   SpO2: 100%   Weight: 247 lb 9.6 oz (112.3 kg)   PainSc:  7    Body mass index is 37.65 kg/m.   PHYSICAL EXAM GENERAL:alert, no distress and comfortable SKIN: no rash  EYES: sclera clear NECK: without mass LYMPH:  no palpable cervical or supraclavicular lymphadenopathy  LUNGS: normal breathing effort HEART:  no lower extremity edema ABDOMEN: abdomen soft, non-tender and normal bowel sounds NEURO: alert & oriented x 3 with fluent speech, no focal motor/sensory deficits Breast exam: s/p R mastectomy and bilateral reconstruction. Mild scar tissue. No palpable mass or nodularity in either breast or axilla that I could appreciate.   CBC    Component Value Date/Time   WBC 4.3 01/15/2023 0832   WBC 4.5 07/26/2020 1240   RBC 3.97 01/15/2023 0832   HGB 11.6 (L) 01/15/2023 0832   HGB 11.6 06/02/2019 0902   HGB 12.1 05/21/2015 1155   HCT  34.1 (L) 01/15/2023 0832   HCT 33.1 (L) 09/06/2021 0940   PLT 265 01/15/2023 0832   PLT 314 06/02/2019 0902   MCV 85.9 01/15/2023 0832   MCV 87 06/02/2019 0902   MCH 29.2 01/15/2023 0832   MCHC 34.0 01/15/2023 0832   RDW 13.6 01/15/2023 0832   RDW 13.8 06/02/2019 0902   LYMPHSABS 1.6 01/15/2023 0832   MONOABS 0.4 01/15/2023 0832   EOSABS 0.1 01/15/2023 0832   BASOSABS 0.0 01/15/2023 0832     CMP     Component Value Date/Time   NA 140 01/15/2023 0832   NA 139 06/02/2019 0902   K 4.1 01/15/2023 0832   CL 109 01/15/2023 0832   CO2 27 01/15/2023 0832   GLUCOSE 89 01/15/2023 0832   BUN 10 01/15/2023 0832   BUN 6 06/02/2019 0902   CREATININE 0.99 01/15/2023 0832   CREATININE 0.85 07/26/2020 1240   CALCIUM 8.8 (L) 01/15/2023 0832   PROT 7.1 01/15/2023 0832   PROT 7.1 06/02/2019 0902   ALBUMIN 4.0 01/15/2023 0832   ALBUMIN 4.5 06/02/2019 0902   AST 20 01/15/2023 0832   ALT 15 01/15/2023 0832   ALKPHOS 40 01/15/2023 0832   BILITOT 0.7 01/15/2023 0832   GFRNONAA >60  01/15/2023 0832   GFRAA 81 06/02/2019 0902     ASSESSMENT & PLAN:Gail Erickson is a 47 y.o. 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  -She began tamoxifen 20mg  once daily on 07/10/20.  -08/2021 screening breast MRI showed new left breast mass at 5:00 measuring up to 9 mm, path showed benign breast tissue with focally increased stromal fibrosis and fat of the left breast. Path was found to be discordant. -s/p left lumpectomy 10/21/21 by Dr. Carolynne Edouard, path showed fibromatosis, negative for malignancy. She recovered -left mammo 01/2022 was negative. MRI 07/2022 was benign -Ms. Lincks is clinically doing well.  Tolerating tamoxifen.  Exam is benign, labs are unremarkable/stable.  Overall no clinical concern for recurrence -Continue surveillance and high risk screening program (annual left mammo and breast MRI staggered 6 months apart), and tamoxifen -F/up in 6 months, or sooner if needed    Mild anemia -She has had mild normocytic anemia Hgb 11.4 - 11.8 since 2021 -08/2021 hgb 10.8, further w/up with Iron, B12, folate, and TSH were normal -Colonoscopy 05/2021 showed no bleeding -She takes womens multivitamin when she remembers -Hgb 11.9 today -If she has worsening anemia in the future, I will ask GI to consider EGD; currently no UGI symptoms  Numbness/tingling to bilateral upper extremities -Onset ~2 months, no obvious injury.  -H/o pre-DM, last A1c 5.5, not likely contributing -No known arthritis. She has positional neck and left shoulder pain intermittently after sleeping. Possibly contributing -Will check B12 level -If normal, may consider holding tamoxifen 1-2 weeks to see if this is contributing.     Family history, Genetic testing negative for BRCA 1/2 -She has an extensive family history of cancer, mainly breast cancer on both sides of her family.  -Genetic testing was negative    Health maintenance, pre-- DM, stress -She is seen by Endocrinologist and healthy weight loss clinic -she recently started a new job and has health insurance -She has periodic levels of high stress. She is  between therapists and would like to speak with SW for resources      PLAN: -Last MRI and today's labs reviewed, add on B12 level -If B12 is normal, consider holding tamoxifen 1-2 weeks to see if numbness/tingling resolves vs further work up with PCP -Continue breast cancer surveillance and Tamoxifen -L Mammo 02/12/23 as scheduled -F/up in 6 months, or sooner if needed   Orders Placed This Encounter  Procedures   Vitamin B12    Standing Status:   Future    Standing Expiration Date:   01/15/2024      All questions were answered. The patient knows to call the clinic with any problems, questions or concerns. No barriers to learning were detected. I spent 20 minutes counseling the patient face to face. The total time spent in the appointment was 30 minutes and more than 50% was on counseling, review of test results, and coordination of care.   [name], NP-C 01/15/2023

## 2023-01-15 ENCOUNTER — Inpatient Hospital Stay: Payer: 59

## 2023-01-15 ENCOUNTER — Inpatient Hospital Stay: Payer: 59 | Attending: Hematology

## 2023-01-15 ENCOUNTER — Inpatient Hospital Stay: Payer: 59 | Admitting: Nurse Practitioner

## 2023-01-15 ENCOUNTER — Encounter: Payer: Self-pay | Admitting: Nurse Practitioner

## 2023-01-15 VITALS — BP 121/76 | HR 66 | Temp 98.3°F | Resp 13 | Wt 247.6 lb

## 2023-01-15 DIAGNOSIS — Z23 Encounter for immunization: Secondary | ICD-10-CM

## 2023-01-15 DIAGNOSIS — R202 Paresthesia of skin: Secondary | ICD-10-CM | POA: Insufficient documentation

## 2023-01-15 DIAGNOSIS — C50811 Malignant neoplasm of overlapping sites of right female breast: Secondary | ICD-10-CM

## 2023-01-15 DIAGNOSIS — D649 Anemia, unspecified: Secondary | ICD-10-CM | POA: Diagnosis not present

## 2023-01-15 DIAGNOSIS — R200 Anesthesia of skin: Secondary | ICD-10-CM | POA: Diagnosis not present

## 2023-01-15 DIAGNOSIS — Z79810 Long term (current) use of selective estrogen receptor modulators (SERMs): Secondary | ICD-10-CM | POA: Diagnosis not present

## 2023-01-15 DIAGNOSIS — Z170 Estrogen receptor positive status [ER+]: Secondary | ICD-10-CM | POA: Diagnosis not present

## 2023-01-15 LAB — CMP (CANCER CENTER ONLY)
ALT: 15 U/L (ref 0–44)
AST: 20 U/L (ref 15–41)
Albumin: 4 g/dL (ref 3.5–5.0)
Alkaline Phosphatase: 40 U/L (ref 38–126)
Anion gap: 4 — ABNORMAL LOW (ref 5–15)
BUN: 10 mg/dL (ref 6–20)
CO2: 27 mmol/L (ref 22–32)
Calcium: 8.8 mg/dL — ABNORMAL LOW (ref 8.9–10.3)
Chloride: 109 mmol/L (ref 98–111)
Creatinine: 0.99 mg/dL (ref 0.44–1.00)
GFR, Estimated: >60 mL/min (ref >60)
Glucose, Bld: 89 mg/dL (ref 70–99)
Potassium: 4.1 mmol/L (ref 3.5–5.1)
Sodium: 140 mmol/L (ref 135–145)
Total Bilirubin: 0.7 mg/dL (ref <1.2)
Total Protein: 7.1 g/dL (ref 6.5–8.1)

## 2023-01-15 LAB — CBC WITH DIFFERENTIAL (CANCER CENTER ONLY)
Abs Immature Granulocytes: 0.01 10*3/uL (ref 0.00–0.07)
Basophils Absolute: 0 10*3/uL (ref 0.0–0.1)
Basophils Relative: 1 %
Eosinophils Absolute: 0.1 10*3/uL (ref 0.0–0.5)
Eosinophils Relative: 3 %
HCT: 34.1 % — ABNORMAL LOW (ref 36.0–46.0)
Hemoglobin: 11.6 g/dL — ABNORMAL LOW (ref 12.0–15.0)
Immature Granulocytes: 0 %
Lymphocytes Relative: 36 %
Lymphs Abs: 1.6 10*3/uL (ref 0.7–4.0)
MCH: 29.2 pg (ref 26.0–34.0)
MCHC: 34 g/dL (ref 30.0–36.0)
MCV: 85.9 fL (ref 80.0–100.0)
Monocytes Absolute: 0.4 10*3/uL (ref 0.1–1.0)
Monocytes Relative: 9 %
Neutro Abs: 2.2 10*3/uL (ref 1.7–7.7)
Neutrophils Relative %: 51 %
Platelet Count: 265 10*3/uL (ref 150–400)
RBC: 3.97 MIL/uL (ref 3.87–5.11)
RDW: 13.6 % (ref 11.5–15.5)
WBC Count: 4.3 10*3/uL (ref 4.0–10.5)
nRBC: 0 % (ref 0.0–0.2)

## 2023-01-15 LAB — VITAMIN B12: Vitamin B-12: 257 pg/mL (ref 180–914)

## 2023-01-15 MED ORDER — INFLUENZA VIRUS VACC SPLIT PF 0.5 ML IM SUSY
0.5000 mL | PREFILLED_SYRINGE | INTRAMUSCULAR | Status: DC
Start: 2023-01-16 — End: 2023-01-15

## 2023-01-15 MED ORDER — INFLUENZA VIRUS VACC SPLIT PF (FLUZONE) 0.5 ML IM SUSY
0.5000 mL | PREFILLED_SYRINGE | Freq: Once | INTRAMUSCULAR | Status: DC
  Filled 2023-01-15: qty 0.5

## 2023-01-15 MED ORDER — INFLUENZA VIRUS VACC SPLIT PF 0.5 ML IM SUSY
0.5000 mL | PREFILLED_SYRINGE | Freq: Once | INTRAMUSCULAR | Status: AC
Start: 2023-01-15 — End: 2023-01-15
  Administered 2023-01-15: 0.5 mL via INTRAMUSCULAR
  Filled 2023-01-15: qty 0.5

## 2023-01-15 NOTE — Patient Instructions (Signed)
Influenza (Flu) Vaccine (Inactivated or Recombinant): What You Need to Know Many vaccine information statements are available in Spanish and other languages. See PromoAge.com.br. 1. Why get vaccinated? Influenza vaccine can prevent influenza (flu). Flu is a contagious disease that spreads around the [place] every year, usually between October and May. Anyone can get the flu, but it is more dangerous for some people. Infants and young children, people 58 years and older, pregnant people, and people with certain health conditions or a weakened immune system are at greatest risk of flu complications. Pneumonia, bronchitis, sinus infections, and ear infections are examples of flu-related complications. If you have a medical condition, such as heart disease, cancer, or diabetes, flu can make it worse. Flu can cause fever and chills, sore throat, muscle aches, fatigue, cough, headache, and runny or stuffy nose. Some people may have vomiting and diarrhea, though this is more common in children than adults. In an average year, thousands of people in the [place] die from flu, and many more are hospitalized. Flu vaccine prevents millions of illnesses and flu-related visits to the doctor each year. 2. Influenza vaccines CDC recommends everyone 6 months and older get vaccinated every flu season. Children 6 months through 51 years of age may need 2 doses during a single flu season. Everyone else needs only 1 dose each flu season. It takes about 2 weeks for protection to develop after vaccination. There are many flu viruses, and they are always changing. Each year a new flu vaccine is made to protect against the influenza viruses believed to be likely to cause disease in the upcoming flu season. Even when the vaccine doesn't exactly match these viruses, it may still provide some protection. Influenza vaccine does not cause flu. Influenza vaccine may be given at the same time as other vaccines. 3.  Talk with your health care provider Tell your vaccination provider if the person getting the vaccine: Has had an allergic reaction after a previous dose of influenza vaccine, or has any severe, life-threatening allergies Has ever had Guillain-Barr Syndrome (also called "GBS") In some cases, your health care provider may decide to postpone influenza vaccination until a future visit. Influenza vaccine can be administered at any time during pregnancy. People who are or will be pregnant during influenza season should receive inactivated influenza vaccine. People with minor illnesses, such as a cold, may be vaccinated. People who are moderately or severely ill should usually wait until they recover before getting influenza vaccine. Your health care provider can give you more information. 4. Risks of a vaccine reaction Soreness, redness, and swelling where the shot is given, fever, muscle aches, and headache can happen after influenza vaccination. There may be a very small increased risk of Guillain-Barr Syndrome (GBS) after inactivated influenza vaccine (the flu shot). Young children who get the flu shot along with pneumococcal vaccine (PCV13) and/or DTaP vaccine at the same time might be slightly more likely to have a seizure caused by fever. Tell your health care provider if a child who is getting flu vaccine has ever had a seizure. People sometimes faint after medical procedures, including vaccination. Tell your provider if you feel dizzy or have vision changes or ringing in the ears. As with any medicine, there is a very remote chance of a vaccine causing a severe allergic reaction, other serious injury, or death. 5. What if there is a serious problem? An allergic reaction could occur after the vaccinated person leaves the clinic. If you see signs of  a severe allergic reaction (hives, swelling of the face and throat, difficulty breathing, a fast heartbeat, dizziness, or weakness), call 9-1-1 and get  the person to the nearest hospital. For other signs that concern you, call your health care provider. Adverse reactions should be reported to the Vaccine Adverse Event Reporting System (VAERS). Your health care provider will usually file this report, or you can do it yourself. Visit the VAERS website at www.vaers.LAgents.no or call (774)365-3656. VAERS is only for reporting reactions, and VAERS staff members do not give medical advice. 6. The National Vaccine Injury Compensation Program The [employer] Vaccine Injury Compensation Program (VICP) is a federal program that was created to compensate people who may have been injured by certain vaccines. Claims regarding alleged injury or death due to vaccination have a time limit for filing, which may be as short as two years. Visit the VICP website at SpiritualWord.at or call 3187036210 to learn about the program and about filing a claim. 7. How can I learn more? Ask your health care provider. Call your local or state health department. Visit the website of the Food and Drug Administration (FDA) for vaccine package inserts and additional information at FinderList.no. Contact the Centers for Disease Control and Prevention (CDC): Call 787-694-5930 (1-800-CDC-INFO) or Visit CDC's website at BiotechRoom.com.cy. Source: CDC Vaccine Information Statement Inactivated Influenza Vaccine (10/17/2019) This same material is available at FootballExhibition.com.br for no charge. This information is not intended to replace advice given to you by your health care provider. Make sure you discuss any questions you have with your health care provider. Document Revised: 06/14/2022 Document Reviewed: 03/20/2022 Elsevier Patient Education  2024 [employer].

## 2023-01-17 ENCOUNTER — Encounter: Payer: Self-pay | Admitting: Nurse Practitioner

## 2023-01-27 ENCOUNTER — Other Ambulatory Visit: Payer: Self-pay | Admitting: Bariatrics

## 2023-01-29 ENCOUNTER — Encounter: Payer: Self-pay | Admitting: Bariatrics

## 2023-01-29 ENCOUNTER — Ambulatory Visit: Payer: 59 | Admitting: Bariatrics

## 2023-01-29 VITALS — BP 124/81 | HR 74 | Temp 98.5°F | Ht 68.0 in | Wt 240.0 lb

## 2023-01-29 DIAGNOSIS — E669 Obesity, unspecified: Secondary | ICD-10-CM | POA: Diagnosis not present

## 2023-01-29 DIAGNOSIS — E66812 Obesity, class 2: Secondary | ICD-10-CM

## 2023-01-29 DIAGNOSIS — Z6836 Body mass index (BMI) 36.0-36.9, adult: Secondary | ICD-10-CM | POA: Diagnosis not present

## 2023-01-29 DIAGNOSIS — R632 Polyphagia: Secondary | ICD-10-CM | POA: Diagnosis not present

## 2023-01-29 DIAGNOSIS — E559 Vitamin D deficiency, unspecified: Secondary | ICD-10-CM

## 2023-01-29 MED ORDER — VITAMIN D (ERGOCALCIFEROL) 1.25 MG (50000 UNIT) PO CAPS: 50000.0000 [IU] | ORAL_CAPSULE | ORAL | 0 refills | Status: DC

## 2023-01-29 MED ORDER — ZEPBOUND 7.5 MG/0.5ML ~~LOC~~ SOAJ: 7.5000 mg | SUBCUTANEOUS | 0 refills | Status: DC

## 2023-01-29 NOTE — Progress Notes (Signed)
WEIGHT SUMMARY AND BIOMETRICS  Weight Lost Since Last Visit: 0  Weight Gained Since Last Visit: 2lb   Vitals Temp: 98.5 F (36.9 C) BP: 124/81 Pulse Rate: 74 SpO2: 99 %   Anthropometric Measurements Height: 5\' 8"  (1.727 m) Weight: 240 lb (108.9 kg) BMI (Calculated): 36.5 Weight at Last Visit: 238lb Weight Lost Since Last Visit: 0 Weight Gained Since Last Visit: 2lb Starting Weight: 241lb Total Weight Loss (lbs): 1 lb (0.454 kg)   Body Composition  Body Fat %: 42.3 % Fat Mass (lbs): 101.8 lbs Muscle Mass (lbs): 132 lbs Total Body Water (lbs): 92.6 lbs Visceral Fat Rating : 11   Other Clinical Data Fasting: no Labs: no Today's Visit #: 16 Starting Date: 09/14/21    OBESITY Gail Erickson is here to discuss her progress with her obesity treatment plan along with follow-up of her obesity related diagnoses.    Nutrition Plan: the Category 2 plan - 35% adherence.  Current exercise: none  Interim History:  She is up 2 lbs since her last appointment.  Eating all of the food on the plan., Protein intake is as prescribed, Is not skipping meals, and Water intake is adequate.   Pharmacotherapy: [name] is on Zepbound 7.5 mg SQ weekly Adverse side effects: None Hunger is moderately controlled.  Cravings are moderately controlled.  Assessment/Plan:   1. Vitamin D deficiency Vitamin D Deficiency Vitamin D is not at goal of 50.  Most recent vitamin D level was 31.2. She is on  prescription ergocalciferol 50,000 IU weekly. Lab Results  Component Value Date   VD25OH 31.2 05/17/2022   VD25OH 27.3 (L) 09/14/2021   VD25OH 23 (L) 07/28/2021    Plan: Refill prescription vitamin D 50,000 IU weekly.   Polyphagia Gail Erickson has endorsed excessive hunger before starting Zepbound Medication(s): Zepbound Effects of medication:  moderately controlled. Cravings are  moderately controlled.   Plan: Medication(s): Zepbound 7.5 mg SQ weekly Will increase water, protein and fiber to help assuage hunger.  Will minimize foods that have a high glucose index/load to minimize reactive hypoglycemia.  Given recipes for Thanksgiving and handouts for increased protein.    Generalized Obesity: Current BMI BMI (Calculated): 36.5   Pharmacotherapy Plan Continue and refill  Zepbound 7.5 mg SQ weekly  Gail Erickson is currently in the action stage of change. As such, her goal is to continue with weight loss efforts.  She has agreed to the Category 2 plan.  Exercise goals: All adults should avoid inactivity. Some physical activity is better than none, and adults who participate in any amount of physical activity gain some health benefits.  Behavioral modification strategies: increasing lean protein intake, no meal skipping, decrease eating out, meal planning , increase water intake, better snacking choices, planning for success, increasing vegetables, get rid of junk food in the home, avoiding temptations, keep healthy foods in the home, work on smaller  portions, and mindful eating.  Gail Erickson has agreed to follow-up with our clinic in 4 weeks.       Objective:   VITALS: Per patient if applicable, see vitals. GENERAL: Alert and in no acute distress. CARDIOPULMONARY: No increased WOB. Speaking in clear sentences.  PSYCH: Pleasant and cooperative. Speech normal rate and rhythm. Affect is appropriate. Insight and judgement are appropriate. Attention is focused, linear, and appropriate.  NEURO: Oriented as arrived to appointment on time with no prompting.   Attestation Statements:   This was prepared with the assistance of Engineer, civil (consulting).  Occasional wrong-word or sound-a-like substitutions may have occurred due to the inherent limitations of voice recognition   Corinna Capra, DO

## 2023-02-05 ENCOUNTER — Other Ambulatory Visit: Payer: Self-pay | Admitting: Hematology

## 2023-02-05 ENCOUNTER — Telehealth: Payer: Self-pay

## 2023-02-05 DIAGNOSIS — C50811 Malignant neoplasm of overlapping sites of right female breast: Secondary | ICD-10-CM

## 2023-02-05 NOTE — Telephone Encounter (Signed)
We're pleased to let you know that we've approved your or your doctor's request for coverage for Zepbound 2.5MG /0.5ML [place] SO[facility]. You can now fill your prescription, and it will be covered according to your plan. As long as you remain covered by your prescription drug plan and there are no changes to your plan benefits, this request is approved from 01/05/2023 to 10/05/2023. When this approval expires, please speak to your doctor about your treatment.

## 2023-02-05 NOTE — Telephone Encounter (Signed)
PA submitted through Cover My Meds for Zepbound. Awaiting insurance determination. Key: [id number]

## 2023-02-12 ENCOUNTER — Ambulatory Visit
Admission: RE | Admit: 2023-02-12 | Discharge: 2023-02-12 | Disposition: A | Discharge status: Home / Self Care | Payer: 59 | Source: Ambulatory Visit | Attending: Hematology | Admitting: Hematology

## 2023-02-12 DIAGNOSIS — Z1231 Encounter for screening mammogram for malignant neoplasm of breast: Secondary | ICD-10-CM

## 2023-02-22 ENCOUNTER — Encounter: Payer: Self-pay | Admitting: Bariatrics

## 2023-02-22 ENCOUNTER — Ambulatory Visit: Payer: 59 | Admitting: Bariatrics

## 2023-02-22 VITALS — BP 119/73 | HR 80 | Temp 98.7°F | Ht 68.0 in | Wt 242.0 lb

## 2023-02-22 DIAGNOSIS — E669 Obesity, unspecified: Secondary | ICD-10-CM | POA: Diagnosis not present

## 2023-02-22 DIAGNOSIS — E559 Vitamin D deficiency, unspecified: Secondary | ICD-10-CM | POA: Diagnosis not present

## 2023-02-22 DIAGNOSIS — R632 Polyphagia: Secondary | ICD-10-CM | POA: Diagnosis not present

## 2023-02-22 DIAGNOSIS — Z6836 Body mass index (BMI) 36.0-36.9, adult: Secondary | ICD-10-CM

## 2023-02-22 MED ORDER — ZEPBOUND 10 MG/0.5ML ~~LOC~~ SOAJ: 10.0000 mg | SUBCUTANEOUS | 0 refills | Status: DC

## 2023-02-22 MED ORDER — VITAMIN D (ERGOCALCIFEROL) 1.25 MG (50000 UNIT) PO CAPS: 50000.0000 [IU] | ORAL_CAPSULE | ORAL | 0 refills | Status: DC

## 2023-02-22 NOTE — Progress Notes (Signed)
WEIGHT SUMMARY AND BIOMETRICS  Weight Lost Since Last Visit: 0  Weight Gained Since Last Visit: 2lb   Vitals Temp: 98.7 F (37.1 C) BP: 119/73 Pulse Rate: 80 SpO2: 100 %   Anthropometric Measurements Height: 5\' 8"  (1.727 m) Weight: 242 lb (109.8 kg) BMI (Calculated): 36.8 Weight at Last Visit: 240lb Weight Lost Since Last Visit: 0 Weight Gained Since Last Visit: 2lb Starting Weight: 241lb Total Weight Loss (lbs): 0 lb (0 kg)   Body Composition  Body Fat %: 43.5 % Fat Mass (lbs): 105.6 lbs Muscle Mass (lbs): 130 lbs Total Body Water (lbs): 95.4 lbs Visceral Fat Rating : 12   Other Clinical Data Fasting: no Labs: no Today's Visit #: 17 Starting Date: 09/14/21    OBESITY Gail Erickson is here to discuss her progress with her obesity treatment plan along with follow-up of her obesity related diagnoses.    Nutrition Plan: the Category 2 plan - 20% adherence.  Current exercise: none  Interim History:  She is up 2 lbs since her last visit. She has traveled for about 2 weeks and is thus retaining fluid per the bio-impedence scale of about 3 lbs.  Eating all of the food on the plan., Is not skipping meals, Water intake is adequate., Reports polyphagia, and Reports excessive cravings.   Pharmacotherapy: Gail Erickson is on Zepbound 10 mg SQ weekly Adverse side effects: None Hunger is moderately controlled.  Cravings are moderately controlled.  Assessment/Plan:   1. Vitamin D deficiency Vitamin D Deficiency Vitamin D is not at goal of 50.  Most recent vitamin D level was 31.2. She is on  prescription ergocalciferol 50,000 IU weekly. Lab Results  Component Value Date   VD25OH 31.2 05/17/2022   VD25OH 27.3 (L) 09/14/2021   VD25OH 23 (L) 07/28/2021    Plan: Refill prescription vitamin D 50,000 IU weekly.   2. Polyphagia Polyphagia Gail Erickson endorses excessive  hunger.  Medication(s): Zepbound 10 mg Effects of medication:  moderately controlled. Cravings are moderately controlled.   Plan: Medication(s): Zepbound 10 mg SQ weekly Will increase water, protein and fiber to help assuage hunger.  Will minimize foods that have a high glucose index/load to minimize reactive hypoglycemia.  She will continue to use her Lose- It app on her phone.     Generalized Obesity: Current BMI BMI (Calculated): 36.8   Pharmacotherapy Plan Continue and refill  Zepbound 10 mg SQ weekly  Gail Erickson is currently in the action stage of change. As such, her goal is to continue with weight loss efforts.  She has agreed to the Category 2 plan.  Exercise goals: All adults should avoid inactivity. Some physical activity is better than none, and adults who participate in any amount of physical activity gain some health benefits. She needs to get back to the gym.   Behavioral modification strategies: increasing lean protein intake, no meal skipping, decrease eating  out, meal planning , increasing vegetables, and avoiding temptations.  Gail Erickson has agreed to follow-up with our clinic in 4 weeks.       Objective:   VITALS: Per patient if applicable, see vitals. GENERAL: Alert and in no acute distress. CARDIOPULMONARY: No increased WOB. Speaking in clear sentences.  PSYCH: Pleasant and cooperative. Speech normal rate and rhythm. Affect is appropriate. Insight and judgement are appropriate. Attention is focused, linear, and appropriate.  NEURO: Oriented as arrived to appointment on time with no prompting.   Attestation Statements:   This was prepared with the assistance of Engineer, civil (consulting).  Occasional wrong-word or sound-a-like substitutions may have occurred due to the inherent limitations of voice recognition   Gail Capra, DO

## 2023-03-27 ENCOUNTER — Ambulatory Visit: Payer: 59 | Admitting: Bariatrics

## 2023-03-28 ENCOUNTER — Other Ambulatory Visit: Payer: Self-pay | Admitting: Bariatrics

## 2023-04-05 ENCOUNTER — Ambulatory Visit: Payer: 59 | Admitting: Bariatrics

## 2023-04-05 ENCOUNTER — Encounter: Payer: Self-pay | Admitting: Bariatrics

## 2023-04-05 VITALS — BP 109/70 | HR 73 | Temp 98.0°F | Ht 68.0 in | Wt 240.0 lb

## 2023-04-05 DIAGNOSIS — E559 Vitamin D deficiency, unspecified: Secondary | ICD-10-CM | POA: Diagnosis not present

## 2023-04-05 DIAGNOSIS — Z6836 Body mass index (BMI) 36.0-36.9, adult: Secondary | ICD-10-CM

## 2023-04-05 DIAGNOSIS — R632 Polyphagia: Secondary | ICD-10-CM

## 2023-04-05 DIAGNOSIS — E669 Obesity, unspecified: Secondary | ICD-10-CM | POA: Diagnosis not present

## 2023-04-05 DIAGNOSIS — E66812 Obesity, class 2: Secondary | ICD-10-CM

## 2023-04-05 MED ORDER — VITAMIN D (ERGOCALCIFEROL) 1.25 MG (50000 UNIT) PO CAPS: 50000.0000 [IU] | ORAL_CAPSULE | ORAL | 0 refills | Status: DC

## 2023-04-05 MED ORDER — ZEPBOUND 10 MG/0.5ML ~~LOC~~ SOAJ: 10.0000 mg | SUBCUTANEOUS | 0 refills | Status: DC

## 2023-04-05 NOTE — Progress Notes (Signed)
WEIGHT SUMMARY AND BIOMETRICS  Weight Lost Since Last Visit: 2lb  Weight Gained Since Last Visit: 0   Vitals Temp: 98 F (36.7 C) BP: 109/70 Pulse Rate: 73 SpO2: 100 %   Anthropometric Measurements Height: 5\' 8"  (1.727 m) Weight: 240 lb (108.9 kg) BMI (Calculated): 36.5 Weight at Last Visit: 242lb Weight Lost Since Last Visit: 2lb Weight Gained Since Last Visit: 0 Starting Weight: 241lb Total Weight Loss (lbs): 1 lb (0.454 kg)   Body Composition  Body Fat %: 43.6 % Fat Mass (lbs): 104.6 lbs Muscle Mass (lbs): 128.6 lbs Total Body Water (lbs): 95.4 lbs Visceral Fat Rating : 11   Other Clinical Data Fasting: no Labs: no Today's Visit #: 18 Starting Date: 09/14/21    OBESITY Gail Erickson is here to discuss her progress with her obesity treatment plan along with follow-up of her obesity related diagnoses.    Nutrition Plan: the Category 2 plan - 50% adherence.  Current exercise:  cubie   Interim History:  She is down 2 lbs since her last visit. She is using the Gail Erickson Eating all of the food on the plan., Protein intake is as prescribed, and Water intake is adequate.   Pharmacotherapy: Gail Erickson is on Zepbound 10 mg SQ weekly Adverse side effects: None Hunger is moderately controlled.  Cravings are moderately controlled.  Assessment/Plan:   Vitamin D Deficiency Vitamin D is not at goal of 50.  Most recent vitamin D level was 31.2. She is on  prescription ergocalciferol 50,000 IU weekly. Lab Results  Component Value Date   VD25OH 31.2 05/17/2022   VD25OH 27.3 (L) 09/14/2021   VD25OH 23 (L) 07/28/2021    Plan: Refill prescription vitamin D 50,000 IU weekly.    Polyphagia Gail Erickson endorses excessive hunger.  Medication(s): Zepbound 10 mg Effects of medication:  moderately controlled. Cravings are moderately controlled.   Plan: Medication(s):  Zepbound 10 mg SQ weekly Will increase water, protein and fiber to help assuage hunger.  Will minimize foods that have a high glucose index/load to minimize reactive hypoglycemia.  She will use the lose it app for monitoring her protein and her calories. She can continue her restricted eating with her eating from 10:00 AM to 7:00 PM.     Generalized Obesity: Current BMI BMI (Calculated): 36.5   Pharmacotherapy Plan Continue and refill  Zepbound 10 mg SQ weekly  Gail Erickson is currently in the action stage of change. As such, her goal is to continue with weight loss efforts.  She has agreed to the Category 2 plan.  Exercise goals: For substantial health benefits, adults should do at least 150 minutes (2 hours and 30 minutes) a week of moderate-intensity, or 75 minutes (1 hour and 15 minutes) a week of vigorous-intensity aerobic physical activity, or an equivalent combination of moderate- and vigorous-intensity aerobic activity. Aerobic activity should be performed in episodes of at  least 10 minutes, and preferably, it should be spread throughout the week.  She will continue her dancing.  Behavioral modification strategies: increasing lean protein intake, no meal skipping, meal planning , increase water intake, better snacking choices, planning for success, increasing fiber rich foods, avoiding temptations, keep healthy foods in the home, weigh protein portions, and mindful eating.  Gail Erickson has agreed to follow-up with our clinic in 4 weeks.      Objective:   VITALS: Per patient if applicable, see vitals. GENERAL: Alert and in no acute distress. CARDIOPULMONARY: No increased WOB. Speaking in clear sentences.  PSYCH: Pleasant and cooperative. Speech normal rate and rhythm. Affect is appropriate. Insight and judgement are appropriate. Attention is focused, linear, and appropriate.  NEURO: Oriented as arrived to appointment on time with no prompting.   Attestation Statements:    This was  prepared with the assistance of Engineer, civil (consulting).  Occasional wrong-word or sound-a-like substitutions may have occurred due to the inherent limitations of voice recognition   Corinna Capra, DO

## 2023-04-06 ENCOUNTER — Other Ambulatory Visit: Payer: Self-pay | Admitting: Bariatrics

## 2023-04-06 DIAGNOSIS — E559 Vitamin D deficiency, unspecified: Secondary | ICD-10-CM

## 2023-05-03 ENCOUNTER — Ambulatory Visit: Payer: 59 | Admitting: Bariatrics

## 2023-05-08 ENCOUNTER — Encounter: Payer: Self-pay | Admitting: Bariatrics

## 2023-05-08 ENCOUNTER — Ambulatory Visit: Payer: 59 | Admitting: Bariatrics

## 2023-05-08 VITALS — BP 114/70 | HR 76 | Temp 98.0°F | Ht 68.0 in | Wt 236.0 lb

## 2023-05-08 DIAGNOSIS — R632 Polyphagia: Secondary | ICD-10-CM | POA: Diagnosis not present

## 2023-05-08 DIAGNOSIS — E669 Obesity, unspecified: Secondary | ICD-10-CM

## 2023-05-08 DIAGNOSIS — E785 Hyperlipidemia, unspecified: Secondary | ICD-10-CM | POA: Diagnosis not present

## 2023-05-08 DIAGNOSIS — E782 Mixed hyperlipidemia: Secondary | ICD-10-CM

## 2023-05-08 DIAGNOSIS — Z6835 Body mass index (BMI) 35.0-35.9, adult: Secondary | ICD-10-CM | POA: Diagnosis not present

## 2023-05-08 DIAGNOSIS — E66812 Obesity, class 2: Secondary | ICD-10-CM

## 2023-05-08 DIAGNOSIS — E559 Vitamin D deficiency, unspecified: Secondary | ICD-10-CM

## 2023-05-08 MED ORDER — ZEPBOUND 10 MG/0.5ML ~~LOC~~ SOAJ: 10.0000 mg | SUBCUTANEOUS | 0 refills | Status: DC

## 2023-05-08 MED ORDER — VITAMIN D (ERGOCALCIFEROL) 1.25 MG (50000 UNIT) PO CAPS: 50000.0000 [IU] | ORAL_CAPSULE | ORAL | 0 refills | Status: DC

## 2023-05-08 NOTE — Progress Notes (Signed)
 WEIGHT SUMMARY AND BIOMETRICS  Weight Lost Since Last Visit: 4lb  Weight Gained Since Last Visit: 0lb   Vitals Temp: 98 F (36.7 C) BP: 114/70 Pulse Rate: 76 SpO2: 100 %   Anthropometric Measurements Height: 5\' 8"  (1.727 m) Weight: 236 lb (107 kg) BMI (Calculated): 35.89 Weight at Last Visit: 240lb Weight Lost Since Last Visit: 4lb Weight Gained Since Last Visit: 0lb Starting Weight: 241lb Total Weight Loss (lbs): 5 lb (2.268 kg)   Body Composition  Body Fat %: 43.7 % Fat Mass (lbs): 103.2 lbs Muscle Mass (lbs): 126.2 lbs Total Body Water (lbs): 93.6 lbs Visceral Fat Rating : 11   Other Clinical Data Fasting: No Labs: No Today's Visit #: 69 Starting Date: 09/14/21    OBESITY Gail Erickson is here to discuss her progress with her obesity treatment plan along with follow-up of her obesity related diagnoses.    Nutrition Plan: the Category 2 plan - 25% adherence.  Current exercise: none  Interim History:  Gail Erickson is down 4 lbs since her last visit. Gail Erickson did not exercise as much. Gail Erickson is drinking less sodas, and some intermittent fasting. And the higher dose Zepbound to help with her appetite.  Protein intake is as prescribed, Not journaling consistently., Water intake is adequate., and Denies polyphagia   Pharmacotherapy: Gail Erickson is on Zepbound 10 mg SQ weekly Adverse side effects: None, except occasional constipation and some gas.  Hunger is well controlled.  Cravings are well controlled.  Will use "Smooth Move" tea or capsules and Papaya enzymes as needed.  Assessment/Plan:   Gail Erickson endorses excessive hunger.  Medication(s): Zepbound Effects of medication:  moderately controlled. Cravings are well controlled.   Plan: Medication(s): Zepbound 10 mg SQ weekly Will increase water, protein and fiber to help assuage hunger.  Will minimize foods  that have a high glucose index/load to minimize reactive hypoglycemia.   Hyperlipidemia LDL is not at goal. Medication(s): none Cardiovascular risk factors: obesity (BMI >= 30 kg/m2) and sedentary lifestyle  Lab Results  Component Value Date   CHOL 173 05/17/2022   HDL 46 05/17/2022   LDLCALC 114 (H) 05/17/2022   TRIG 65 05/17/2022   CHOLHDL 3.8 07/28/2021   Lab Results  Component Value Date   ALT 15 01/15/2023   AST 20 01/15/2023   ALKPHOS 40 01/15/2023   BILITOT 0.7 01/15/2023   The 10-year ASCVD risk score (Arnett DK, et al., 2019) is: 1.1%   Values used to calculate the score:     Age: 48 years     Sex: Female     Is Non-Hispanic African American: Yes     Diabetic: No     Tobacco smoker: No     Systolic Blood Pressure: 114 mmHg     Is BP treated: No     HDL Cholesterol: 46 mg/dL     Total Cholesterol: 173 mg/dL  Plan:  Will eat more "healthy fats".  Will avoid all trans fats.  Will read labels Will minimize saturated fats except the following: low fat meats in moderation, diary, and limited dark chocolate.  Increase Omega 3 in foods.     Generalized Obesity: Current BMI BMI (Calculated): 35.89   Pharmacotherapy Plan Continue and refill  Zepbound 10 mg SQ weekly  Gail Erickson is currently in the action stage of change. As such, her goal is to continue with weight loss efforts.  Gail Erickson has agreed to the Category 2 plan.  Exercise goals: For substantial health benefits, adults should do at least 150 minutes (2 hours and 30 minutes) a week of moderate-intensity, or 75 minutes (1 hour and 15 minutes) a week of vigorous-intensity aerobic physical activity, or an equivalent combination of moderate- and vigorous-intensity aerobic activity. Aerobic activity should be performed in episodes of at least 10 minutes, and preferably, it should be spread throughout the week.  Behavioral modification strategies: increasing lean protein intake, decreasing simple carbohydrates , decrease  eating out, meal planning , planning for success, and mindful eating. Gail Erickson will increase her water.   Shenee has agreed to follow-up with our clinic in 4 weeks.      Objective:   VITALS: Per patient if applicable, see vitals. GENERAL: Alert and in no acute distress. CARDIOPULMONARY: No increased WOB. Speaking in clear sentences.  PSYCH: Pleasant and cooperative. Speech normal rate and rhythm. Affect is appropriate. Insight and judgement are appropriate. Attention is focused, linear, and appropriate.  NEURO: Oriented as arrived to appointment on time with no prompting.   Attestation Statements:   This was prepared with the assistance of Engineer, civil (consulting).  Occasional wrong-word or sound-a-like substitutions may have occurred due to the inherent limitations of voice recognition   Corinna Capra, DO

## 2023-06-05 ENCOUNTER — Encounter: Payer: Self-pay | Admitting: Bariatrics

## 2023-06-05 ENCOUNTER — Ambulatory Visit: Payer: 59 | Admitting: Bariatrics

## 2023-06-05 VITALS — BP 104/60 | HR 84 | Temp 97.9°F | Ht 68.0 in | Wt 233.0 lb

## 2023-06-05 DIAGNOSIS — E66812 Obesity, class 2: Secondary | ICD-10-CM

## 2023-06-05 DIAGNOSIS — Z6835 Body mass index (BMI) 35.0-35.9, adult: Secondary | ICD-10-CM | POA: Diagnosis not present

## 2023-06-05 DIAGNOSIS — R632 Polyphagia: Secondary | ICD-10-CM | POA: Diagnosis not present

## 2023-06-05 DIAGNOSIS — E669 Obesity, unspecified: Secondary | ICD-10-CM

## 2023-06-05 DIAGNOSIS — E559 Vitamin D deficiency, unspecified: Secondary | ICD-10-CM

## 2023-06-05 MED ORDER — ZEPBOUND 10 MG/0.5ML ~~LOC~~ SOAJ: 10.0000 mg | SUBCUTANEOUS | 0 refills | Status: DC

## 2023-06-05 MED ORDER — VITAMIN D (ERGOCALCIFEROL) 1.25 MG (50000 UNIT) PO CAPS: 50000.0000 [IU] | ORAL_CAPSULE | ORAL | 0 refills | Status: DC

## 2023-06-05 NOTE — Progress Notes (Signed)
 WEIGHT SUMMARY AND BIOMETRICS  Weight Lost Since Last Visit: 3lb  Weight Gained Since Last Visit: 0   Vitals Temp: 97.9 F (36.6 C) BP: 104/60 Pulse Rate: 84 SpO2: 99 %   Anthropometric Measurements Height: 5\' 8"  (1.727 m) Weight: 233 lb (105.7 kg) BMI (Calculated): 35.44 Weight at Last Visit: 236lb Weight Lost Since Last Visit: 3lb Weight Gained Since Last Visit: 0 Starting Weight: 241lb Total Weight Loss (lbs): 8 lb (3.629 kg)   No data recorded Other Clinical Data Fasting: no Labs: no Today's Visit #: 20 Starting Date: 09/14/21    OBESITY Gail Erickson is here to discuss her progress with her obesity treatment plan along with follow-up of her obesity related diagnoses.    Nutrition Plan: the Category 2 plan - 50% adherence.  Current exercise: none  Interim History:  She is down another 3 lbs since her last visit.  She states that she has is going to be doing in a program through her work will give her 8 weeks with a therapist and a Psychologist, occupational. She states that her sleep has improved.  Eating all of the food on the plan., Protein intake is as prescribed, Water intake is inadequate., and Denies polyphagia   Pharmacotherapy: Gail Erickson is on Zepbound 10 mg SQ weekly Adverse side effects: Constipation, somewhat improved.  Hunger is moderately controlled.  Cravings are moderately controlled.  Assessment/Plan:   1. Vitamin D deficiency Vitamin D Deficiency Vitamin D is not at goal of 50.  Most recent vitamin D level was 31.2. She is on  prescription ergocalciferol 50,000 IU weekly. Lab Results  Component Value Date   VD25OH 31.2 05/17/2022   VD25OH 27.3 (L) 09/14/2021   VD25OH 23 (L) 07/28/2021    Plan: Refill prescription vitamin D 50,000 IU weekly.   Polyphagia Gail Erickson endorses excessive hunger.  Medication(s): Zepbound Effects of medication:  moderately  controlled. Cravings are moderately controlled.   Plan: Medication(s): Zepbound 10 mg SQ weekly Will increase water, protein and fiber to help assuage hunger.  Will minimize foods that have a high glucose index/load to minimize reactive hypoglycemia.  She will continue her exercise.  She plans to go back into the gym.  She will continue to work on her sleep hygiene.     Generalized Obesity: Current BMI BMI (Calculated): 35.44   Pharmacotherapy Plan Continue and refill  Zepbound 10 mg SQ weekly  Gail Erickson is currently in the action stage of change. As such, her goal is to continue with weight loss efforts.  She has agreed to the Category 2 plan.  Exercise goals: All adults should avoid inactivity. Some physical activity is better than none, and adults who participate in any amount of physical activity gain some health benefits.  Behavioral modification strategies: increasing lean protein intake, no meal skipping, meal planning , better snacking choices, increasing vegetables, increasing fiber rich foods, keep healthy  foods in the home, weigh protein portions, pack lunch for work, and mindful eating.  Gail Erickson has agreed to follow-up with our clinic in 4 weeks. She will do more walking and will get back to the gym (treadmill and weighted machines)      Objective:   VITALS: Per patient if applicable, see vitals. GENERAL: Alert and in no acute distress. CARDIOPULMONARY: No increased WOB. Speaking in clear sentences.  PSYCH: Pleasant and cooperative. Speech normal rate and rhythm. Affect is appropriate. Insight and judgement are appropriate. Attention is focused, linear, and appropriate.  NEURO: Oriented as arrived to appointment on time with no prompting.   Attestation Statements:   This was prepared with the assistance of Engineer, civil (consulting).  Occasional wrong-word or sound-a-like substitutions may have occurred due to the inherent limitations of voice recognition   Corinna Capra, DO

## 2023-06-06 ENCOUNTER — Encounter: Payer: Self-pay | Admitting: Internal Medicine

## 2023-06-06 ENCOUNTER — Ambulatory Visit: Payer: Self-pay | Admitting: Internal Medicine

## 2023-06-06 VITALS — BP 120/77 | HR 88 | Ht 68.0 in | Wt 237.4 lb

## 2023-06-06 DIAGNOSIS — H6122 Impacted cerumen, left ear: Secondary | ICD-10-CM | POA: Insufficient documentation

## 2023-06-06 DIAGNOSIS — J329 Chronic sinusitis, unspecified: Secondary | ICD-10-CM | POA: Diagnosis not present

## 2023-06-06 MED ORDER — FLUTICASONE PROPIONATE 50 MCG/ACT NA SUSP: 2.0000 | Freq: Every day | NASAL | 1 refills | Status: DC

## 2023-06-06 NOTE — Patient Instructions (Addendum)
 Please use Debrox ear drops for excess ear wax.  Avoid using any sharp objects for cleaning purposes.  Please start using Flonase for sinusitis/allergies.

## 2023-06-06 NOTE — Assessment & Plan Note (Signed)
 Chronic sinusitis can also cause ear fullness Prescribed Flonase for chronic nasal congestion/allergies

## 2023-06-06 NOTE — Progress Notes (Signed)
 Acute Office Visit  Subjective:    Patient ID: Gail Erickson, female    DOB: 08/18/1975, 48 y.o.   MRN: [id number]  Chief Complaint  Patient presents with   Ear Pain    Pt reports sx of ear pain/ ear fullness ongoing for 3 weeks, worsened in the past few days.     HPI Patient is in today for complaint of left ear pain/fullness for the last 3 weeks.  She also reports mild hearing difficulty, but denies dizziness or tinnitus.  She denies any ear discharge, fever or chills.  She has history of chronic sinusitis, reports chronic nasal congestion and has used Sudafed on intermittent basis.  Past Medical History:  Diagnosis Date   Abnormal Pap smear of cervix 06/02/2014   LGSIL:neg HR HPV (1st abn. pap)   Back pain    Breast cancer (HCC) 2022   Cancer (HCC)    Cyst of right breast    --stable in 2015 U/S done in [place], [place]   Elevated DHEA 05/21/2015   level = 343   Family history of breast cancer    High cholesterol    HSV-1 (herpes simplex virus 1) infection    oral HSV   Joint pain    Migraine without aura    PCOS (polycystic ovarian syndrome)    Prediabetes 2021   sees Dr.Balan   Swelling of lower extremity    Vitamin D deficiency 05/21/2015   level = 10    Past Surgical History:  Procedure Laterality Date   BREAST EXCISIONAL BIOPSY Left 10/21/2021   BREAST LUMPECTOMY WITH RADIOACTIVE SEED LOCALIZATION Left 10/21/2021   Procedure: LEFT BREAST RADIOACTIVE SEED LOCALIZED LUMPECTOMY;  Surgeon: Griselda Miner, MD;  Location: [facility];  Service: General;  Laterality: Left;   BREAST RECONSTRUCTION WITH PLACEMENT OF TISSUE EXPANDER AND FLEX HD (ACELLULAR HYDRATED DERMIS) Right 04/28/2020   Procedure: IMMEDIATE RIGHT BREAST RECONSTRUCTION WITH PLACEMENT OF TISSUE EXPANDER AND FLEX HD (ACELLULAR HYDRATED DERMIS);  Surgeon: Peggye Form, DO;  Location: [facility];  Service: Plastics;  Laterality: Right;   COLONOSCOPY WITH PROPOFOL N/A  06/07/2021   Procedure: COLONOSCOPY WITH PROPOFOL;  Surgeon: Lanelle Bal, DO;  [facility]: [facility];  Service: Endoscopy;  Laterality: N/A;  10:30 / ASA 2   COLPOSCOPY     LIPOSUCTION WITH LIPOFILLING Right 02/17/2021   Procedure: Lipofilling of right breast for symmetry;  Surgeon: Peggye Form, DO;  Location: [facility];  Service: Plastics;  Laterality: Right;  1.5 hour   MASTECTOMY Right 04/28/2020   MASTECTOMY W/ SENTINEL NODE BIOPSY Right 04/28/2020   Procedure: RIGHT MASTECTOMY WITH SENTINEL LYMPH NODE BIOPSY;  Surgeon: Griselda Miner, MD;  Location: [facility];  Service: General;  Laterality: Right;  PECTORAL BLOCK   MASTOPEXY Left 09/23/2020   Procedure: LEFT BREAST MASTOPEXY;  Surgeon: Peggye Form, DO;  Location: [facility];  Service: Plastics;  Laterality: Left;   POLYPECTOMY  06/07/2021   Procedure: POLYPECTOMY;  Surgeon: Lanelle Bal, DO;  [facility];  Service: Endoscopy;;   REDUCTION MAMMAPLASTY Left 04/28/2020   REMOVAL OF TISSUE EXPANDER AND PLACEMENT OF IMPLANT Right 09/23/2020   Procedure: REMOVAL OF TISSUE EXPANDER AND PLACEMENT OF IMPLANT;  Surgeon: Peggye Form, DO;  Location: [facility];  Service: Plastics;  Laterality: Right;  90 min   UMBILICAL HERNIA REPAIR     --as a child  Family History  Problem Relation Age of Onset   Breast cancer Mother 83   Diabetes Mother    Hypertension Mother    Hyperlipidemia Mother    Thyroid disease Mother    Transient ischemic attack Mother    Hyperlipidemia Father    High blood pressure Father    Aneurysm Maternal Aunt 8       Brain   Breast cancer Maternal Aunt 72       Breast cancer   HIV/AIDS Maternal Uncle    Kidney disease Maternal Uncle    Breast cancer Paternal Aunt 45       bil. Breast ca--deceased CHF   Cancer Paternal Uncle        NOS - needed BMT   Cancer Maternal Grandmother 62       adrenal gland  cancer   Breast cancer Paternal Grandmother        dx in her mid to late 55s   Breast cancer Cousin 69       maternal first cousin died Breast ca age 21   Cancer Other        2 maternal great uncles with cancer NOS    Social History   Socioeconomic History   Marital status: Single    Spouse name: Not on file   Number of children: 0   Years of education: Not on file   Highest education level: Not on file  Occupational History   Not on file  Tobacco Use   Smoking status: Never   Smokeless tobacco: Never  Vaping Use   Vaping status: Never Used  Substance and Sexual Activity   Alcohol use: Yes    Comment: 1 drink per month   Drug use: Never   Sexual activity: Yes    Partners: Male    Birth control/protection: Condom    Comment: condoms everytime, not sexually active since 4/22  Other Topics Concern   Not on file  Social History Narrative   -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     Cetirizine HCl (ZYRTEC PO) Take by mouth.     Cholecalciferol (VITAMIN D3) 25 MCG (1000 UT) CAPS Take 1 capsule (1,000 Units total) by mouth daily. 60 capsule 3   Cyanocobalamin (VITAMIN B 12 PO) Take by mouth.     Efinaconazole 10 % SOLN Apply 1 drop topically daily. 4 mL 11   famciclovir (FAMVIR) 500 MG tablet Take 1500mg  singe dose as needed for cold sore 3 tablet 3   Multiple Vitamin (MULTIVITAMIN) tablet Take 1 tablet by mouth every 14 (fourteen) days.     Omega-3 Fatty Acids (FISH OIL) 1000 MG CAPS Take by mouth  every 14 (fourteen) days.     tamoxifen (NOLVADEX) 20 MG tablet TAKE 1 TABLET(20 MG) BY MOUTH DAILY 30 tablet 1   tirzepatide (ZEPBOUND) 10 MG/0.5ML Pen Inject 10 mg into the skin once a week. 2 mL 0   Vitamin D, Ergocalciferol, (DRISDOL) 1.25 MG (50000 UNIT) CAPS capsule Take 1 capsule (50,000 Units total)  by mouth every 7 (seven) days. 5 capsule 0   No facility-administered medications prior to visit.    No Known Allergies  Review of Systems  Constitutional:  Negative for chills and fever.  HENT:  Positive for congestion and ear pain (Left).   Respiratory:  Negative for cough and shortness of breath.   Gastrointestinal:  Negative for nausea and vomiting.  Skin:  Negative for rash.  Neurological:  Negative for dizziness and weakness.  Psychiatric/Behavioral:  Negative for agitation and behavioral problems.        Objective:    Physical Exam Constitutional:      General: She is not in acute distress.    Appearance: She is not diaphoretic.  HENT:     Right Ear: External ear normal. There is no impacted cerumen.     Left Ear: External ear normal. There is impacted cerumen.     Nose: Congestion present.     Mouth/Throat:     Mouth: Mucous membranes are moist.     Pharynx: No posterior oropharyngeal erythema.  Eyes:     General: No scleral icterus.    Extraocular Movements: Extraocular movements intact.  Cardiovascular:     Heart sounds: Normal heart sounds. No murmur heard. Pulmonary:     Breath sounds: Normal breath sounds. No wheezing or rales.  Skin:    General: Skin is warm.     Findings: No rash.  Neurological:     General: No focal deficit present.     Mental Status: She is alert and oriented to person, place, and time.  Psychiatric:        Mood and Affect: Mood normal.        Behavior: Behavior normal.     BP 120/77   Pulse 88   Ht 5\' 8"  (1.727 m)   Wt 237 lb 6.4 oz (107.7 kg)   LMP 04/30/2023 (Approximate)   SpO2 98%   BMI 36.10 kg/m  Wt Readings from  Last 3 Encounters:  06/06/23 237 lb 6.4 oz (107.7 kg)  06/05/23 233 lb (105.7 kg)  05/08/23 236 lb (107 kg)        Assessment & Plan:   Problem List Items Addressed This Visit       Respiratory   Chronic sinusitis   Chronic sinusitis can also cause ear fullness Prescribed Flonase for chronic nasal congestion/allergies      Relevant Medications   fluticasone (FLONASE) 50 MCG/ACT nasal spray     Nervous and Auditory   Impacted cerumen of left ear - Primary   Left ear pain and fullness likely due to impacted cerumen Left ear irrigation done Advised to use Debrox eardrops Advised to avoid using any sharp objects for cleaning purposes If persistent concern, will refer to ENT specialist        Meds ordered this encounter  Medications   fluticasone (FLONASE) 50 MCG/ACT nasal spray    Sig: Place 2 sprays into both nostrils daily.    Dispense:  16 g    Refill:  1     Egor Fullilove Concha Se, MD

## 2023-06-06 NOTE — Assessment & Plan Note (Addendum)
 Left ear pain and fullness likely due to impacted cerumen Left ear irrigation done Advised to use Debrox eardrops Advised to avoid using any sharp objects for cleaning purposes If persistent concern, will refer to ENT specialist

## 2023-07-03 ENCOUNTER — Ambulatory Visit: Payer: 59 | Admitting: Podiatry

## 2023-07-10 ENCOUNTER — Ambulatory Visit: Admitting: Bariatrics

## 2023-07-10 ENCOUNTER — Encounter: Payer: Self-pay | Admitting: Bariatrics

## 2023-07-10 VITALS — BP 110/68 | HR 90 | Temp 98.0°F | Ht 68.0 in | Wt 236.0 lb

## 2023-07-10 DIAGNOSIS — R632 Polyphagia: Secondary | ICD-10-CM

## 2023-07-10 DIAGNOSIS — Z6835 Body mass index (BMI) 35.0-35.9, adult: Secondary | ICD-10-CM | POA: Diagnosis not present

## 2023-07-10 DIAGNOSIS — E662 Morbid (severe) obesity with alveolar hypoventilation: Secondary | ICD-10-CM

## 2023-07-10 DIAGNOSIS — E559 Vitamin D deficiency, unspecified: Secondary | ICD-10-CM | POA: Diagnosis not present

## 2023-07-10 DIAGNOSIS — E669 Obesity, unspecified: Secondary | ICD-10-CM | POA: Diagnosis not present

## 2023-07-10 MED ORDER — VITAMIN D (ERGOCALCIFEROL) 1.25 MG (50000 UNIT) PO CAPS: 50000.0000 [IU] | ORAL_CAPSULE | ORAL | 0 refills | Status: DC

## 2023-07-10 MED ORDER — ZEPBOUND 10 MG/0.5ML ~~LOC~~ SOAJ: 10.0000 mg | SUBCUTANEOUS | 0 refills | Status: DC

## 2023-07-12 NOTE — Progress Notes (Signed)
 WEIGHT SUMMARY AND BIOMETRICS  Weight Lost Since Last Visit: 0lb   Weight Gained Since Last Visit: 3 lb     Vitals Temp: 98 F (36. C) BP: 110/68 Pulse Rate: 90 SpO2: 98 %     Anthropometric Measurements Height: 5\' 8"  (1.727 m) Weight: 236 lb (105.7 kg) BMI (Calculated): 35.88 Weight at Last Visit: 233lb Weight Lost Since Last Visit: 0lb Weight Gained Since Last Visit: 3 lb Starting Weight: 241lb Total Weight Loss (lbs): 5 lb (3.629 kg)     No data recorded Other Clinical Data Fasting: no Labs: no Today's Visit #: 21 Starting Date: 09/14/21    OBESITY Gail Erickson is here to discuss her progress with her obesity treatment plan along with follow-up of her obesity related diagnoses.    Nutrition Plan: the Category 2 plan - 40% adherence.  Current exercise: walking  Interim History:  She is up 3 lbs since her last visit.  Eating all of the food on the plan., Protein intake is less than prescribed., and Water intake is adequate.   Pharmacotherapy: Gail Erickson is on Zepbound  10 mg SQ weekly Adverse side effects: None Hunger is moderately controlled.  Cravings are moderately controlled.  Assessment/Plan:   Vitamin D  Deficiency Vitamin D  is not at goal of 50.  Most recent vitamin D  level was 31.2. She is on  prescription ergocalciferol  50,000 IU weekly. Lab Results  Component Value Date   VD25OH 31.2 05/17/2022   VD25OH 27.3 (L) 09/14/2021   VD25OH 23 (L) 07/28/2021    Plan: Refill prescription vitamin D  50,000 IU weekly.  She will get some sun exposure.  Polyphagia Gail Erickson endorses excessive hunger.  Medication(s): Zepbound  10 mg  Effects of medication:  moderately controlled. Cravings are moderately controlled.   Plan: Medication(s): Zepbound  10 mg SQ weekly Will increase water, protein and fiber to help assuage hunger.  Will minimize foods that  have a high glucose index/load to minimize reactive hypoglycemia.  Discussed elements of the Mediterranean diet so that she can incorporate some of the aspects of it.  Will continue to have her protein smoothies.  Will continue her probiotic for some constipation related to the Zepbound . She will increase her insoluble insoluble fiber. She will add in resistance bands and continue her walking.     Generalized Obesity: Current BMI No data recorded   Pharmacotherapy Plan Continue and refill  Zepbound  10 mg SQ weekly  Gail Erickson is currently in the action stage of change. As such, her goal is to continue with weight loss efforts.  She has agreed to the Category 2 plan.  Exercise goals: All adults should avoid inactivity. Some physical activity is better than none, and adults who participate in any amount of physical activity gain some health benefits.  Behavioral modification strategies: increasing lean protein intake, no meal skipping, meal planning , better snacking choices, planning for success, increasing  vegetables, avoiding temptations, and keep healthy foods in the home.  Gail Erickson has agreed to follow-up with our clinic in 4 weeks.      Objective:   VITALS: Per patient if applicable, see vitals. GENERAL: Alert and in no acute distress. CARDIOPULMONARY: No increased WOB. Speaking in clear sentences.  PSYCH: Pleasant and cooperative. Speech normal rate and rhythm. Affect is appropriate. Insight and judgement are appropriate. Attention is focused, linear, and appropriate.  NEURO: Oriented as arrived to appointment on time with no prompting.   Attestation Statements:   This was prepared with the assistance of Engineer, civil (consulting).  Occasional wrong-word or sound-a-like substitutions may have occurred due to the inherent limitations of voice recognition   Kirk Peper, DO

## 2023-07-13 NOTE — Assessment & Plan Note (Signed)
 invasive ductal carcinoma and DCIS, [id number], stage IA, ER+/PR+/HER2-, Grade II -Diagnosed in 02/2020, s/p right mastectomy with Dr Alethea Andes and Dr Orin Birk on 04/28/20 -Given the T1a tumor, she did not need Oncotype, adjuvant chemotherapy or postmastectomy radiation.  -She began tamoxifen  20mg  once daily on 07/10/20.  -08/2021 screening breast MRI showed new left breast mass at 5:00 measuring up to 9 mm, path showed benign breast tissue with focally increased stromal fibrosis and fat of the left breast. Path was found to be discordant. -s/p left lumpectomy 10/21/21 by Dr. Alethea Andes, path showed fibromatosis, negative for malignancy. She recovered

## 2023-07-16 ENCOUNTER — Inpatient Hospital Stay

## 2023-07-16 ENCOUNTER — Inpatient Hospital Stay: Payer: 59 | Attending: Nurse Practitioner

## 2023-07-16 ENCOUNTER — Inpatient Hospital Stay: Payer: 59 | Admitting: Hematology

## 2023-07-16 VITALS — BP 138/88 | HR 79 | Temp 97.6°F | Resp 17 | Wt 239.4 lb

## 2023-07-16 DIAGNOSIS — E538 Deficiency of other specified B group vitamins: Secondary | ICD-10-CM | POA: Insufficient documentation

## 2023-07-16 DIAGNOSIS — Z9189 Other specified personal risk factors, not elsewhere classified: Secondary | ICD-10-CM | POA: Diagnosis not present

## 2023-07-16 DIAGNOSIS — Z1721 Progesterone receptor positive status: Secondary | ICD-10-CM | POA: Diagnosis not present

## 2023-07-16 DIAGNOSIS — Z1231 Encounter for screening mammogram for malignant neoplasm of breast: Secondary | ICD-10-CM | POA: Diagnosis not present

## 2023-07-16 DIAGNOSIS — C50811 Malignant neoplasm of overlapping sites of right female breast: Secondary | ICD-10-CM | POA: Insufficient documentation

## 2023-07-16 DIAGNOSIS — D649 Anemia, unspecified: Secondary | ICD-10-CM

## 2023-07-16 DIAGNOSIS — Z1732 Human epidermal growth factor receptor 2 negative status: Secondary | ICD-10-CM | POA: Insufficient documentation

## 2023-07-16 DIAGNOSIS — Z170 Estrogen receptor positive status [ER+]: Secondary | ICD-10-CM | POA: Diagnosis not present

## 2023-07-16 LAB — CBC WITH DIFFERENTIAL (CANCER CENTER ONLY)
Abs Immature Granulocytes: 0.01 10*3/uL (ref 0.00–0.07)
Basophils Absolute: 0.1 10*3/uL (ref 0.0–0.1)
Basophils Relative: 1 %
Eosinophils Absolute: 0.1 10*3/uL (ref 0.0–0.5)
Eosinophils Relative: 3 %
HCT: 33.2 % — ABNORMAL LOW (ref 36.0–46.0)
Hemoglobin: 11.4 g/dL — ABNORMAL LOW (ref 12.0–15.0)
Immature Granulocytes: 0 %
Lymphocytes Relative: 43 %
Lymphs Abs: 2.2 10*3/uL (ref 0.7–4.0)
MCH: 28.4 pg (ref 26.0–34.0)
MCHC: 34.3 g/dL (ref 30.0–36.0)
MCV: 82.8 fL (ref 80.0–100.0)
Monocytes Absolute: 0.4 10*3/uL (ref 0.1–1.0)
Monocytes Relative: 8 %
Neutro Abs: 2.3 10*3/uL (ref 1.7–7.7)
Neutrophils Relative %: 45 %
Platelet Count: 267 10*3/uL (ref 150–400)
RBC: 4.01 MIL/uL (ref 3.87–5.11)
RDW: 14.3 % (ref 11.5–15.5)
WBC Count: 5.1 10*3/uL (ref 4.0–10.5)
nRBC: 0 % (ref 0.0–0.2)

## 2023-07-16 LAB — CMP (CANCER CENTER ONLY)
ALT: 14 U/L (ref 0–44)
AST: 15 U/L (ref 15–41)
Albumin: 4.1 g/dL (ref 3.5–5.0)
Alkaline Phosphatase: 39 U/L (ref 38–126)
Anion gap: 5 (ref 5–15)
BUN: 9 mg/dL (ref 6–20)
CO2: 24 mmol/L (ref 22–32)
Calcium: 8.9 mg/dL (ref 8.9–10.3)
Chloride: 110 mmol/L (ref 98–111)
Creatinine: 0.94 mg/dL (ref 0.44–1.00)
GFR, Estimated: >60 mL/min (ref >60)
Glucose, Bld: 95 mg/dL (ref 70–99)
Potassium: 3.9 mmol/L (ref 3.5–5.1)
Sodium: 139 mmol/L (ref 135–145)
Total Bilirubin: 0.6 mg/dL (ref 0.0–1.2)
Total Protein: 7 g/dL (ref 6.5–8.1)

## 2023-07-16 LAB — FERRITIN: Ferritin: 149 ng/mL (ref 11–307)

## 2023-07-16 LAB — VITAMIN B12: Vitamin B-12: 219 pg/mL (ref 180–914)

## 2023-07-16 NOTE — Progress Notes (Signed)
 [facility]   [phone] Fax:([phone]   Clinic Follow up Note   Patient Care Team: Zarwolo, Gloria, FNP as PCP - General (Family Medicine) Jorie Newness, Blondie Burke, MD as Consulting Physician (Obstetrics and Gynecology) Caralyn Chandler, MD as Consulting Physician (General Surgery) Sonja Reader, MD as Consulting Physician (Hematology) Auther Bo, RN as Oncology Nurse Navigator Alane Hsu, RN as Oncology Nurse Navigator Burton, Lacie K, NP as Nurse Practitioner (Nurse Practitioner) Glynda Lash as Consulting Physician (Endocrinology) Diagnostic Radiology & Imaging, [facility] as Consulting Physician (Radiology)  Date of Service:  07/16/2023  CHIEF COMPLAINT: f/u of breast cancer  CURRENT THERAPY:  Adjuvant tamoxifen   Oncology History   Malignant neoplasm of overlapping sites of right breast [facility]) invasive ductal carcinoma and DCIS, pT1aN0M0, stage IA, ER+/PR+/HER2-, Grade II -Diagnosed in 02/2020, s/p right mastectomy with Dr Alethea Andes and Dr Orin Birk on 04/28/20 -Given the T1a tumor, she did not need Oncotype, adjuvant chemotherapy or postmastectomy radiation.  -She began tamoxifen  20mg  once daily on 07/10/20.  -08/2021 screening breast MRI showed new left breast mass at 5:00 measuring up to 9 mm, path showed benign breast tissue with focally increased stromal fibrosis and fat of the left breast. Path was found to be discordant. -s/p left lumpectomy 10/21/21 by Dr. Alethea Andes, path showed fibromatosis, negative for malignancy. She recovered  Assessment & Plan Breast cancer Breast cancer with previous lumpectomy and reduction. Currently on tamoxifen  with night sweats and occasional knee pain. Last mammogram in December and MRI in May. Dense breast tissue necessitates contrast-enhanced mammogram for optimal imaging. She is willing to undergo MRI despite out-of-pocket costs for health monitoring. - Order screening MRI for June - Order contrast-enhanced  mammogram for December - Continue tamoxifen  - Ensure pharmacy refills tamoxifen  as needed  Anemia, unspecified Mild anemia with hemoglobin at 11.4. History of mild anemia six months ago. Menstrual history reveals one to two days of heavy flow, potentially contributing to anemia. No current oral iron supplementation. Iron levels will be checked to assess the need for supplementation. - Order iron level test - Evaluate iron levels to determine need for supplementation  Vitamin B12 deficiency Vitamin B12 deficiency with symptoms of neuropathy and fatigue. Previous recommendation for oral B12 supplementation was not consistently followed. Current B12 levels are low. Monthly B12 injections are planned to address symptoms and improve energy levels. - Order B12 injection monthly starting in 1-2 weeks - Order B12 level test - Reassess B12 levels in 5-6 months to adjust injection frequency  Constipation due to medication Constipation attributed to weight loss medication.  Plan - She is tolerating tamoxifen  well well, will continue - I ordered contrast enhanced screening mammogram for her to be done in December 2025 - She is not consistently taking oral B12, prefer a B12 injection, will schedule monthly B12 injection for her - Will check a B12 and iron level - Lab and follow-up in 6 months   SUMMARY OF ONCOLOGIC HISTORY: Oncology History Overview Note  Cancer Staging Malignant neoplasm of overlapping sites of right breast [facility]) Staging form: Breast, AJCC 8th Edition - Clinical stage from 02/25/2020: Stage IIB (cT3, cN0, cM0, G3, ER+, PR+, HER2-) - Signed by Sonja [place], MD on 02/25/2020 - Pathologic stage from 04/28/2020: Stage IA (pT1a, pN0, cM0, G2, ER+, PR+, HER2-) - Signed by Sonja [place], MD on 05/06/2020 Histologic grading system: 3 grade system    Malignant neoplasm of overlapping sites of right breast (HCC)  01/30/2020 Mammogram   Mammogram  01/30/20  IMPRESSION: 1. 7.3 x 5.7 x 3.0 cm  area of indeterminate calcifications in medial right breast, involving the lower inner and upper inner quadrants, these extend from the retroareolar region posteriorly, in a segmental distribution, suspicious for DCIS. 2. Two probably benign left breast masses and 2 probably benign left axillary lymph nodes with borderline cortical thickening. 3. Benign right breast cyst.   02/13/2020 Initial Biopsy   Diagnosis 02/13/20 1. Breast, right, needle core biopsy, posterior extent - DUCTAL CARCINOMA IN SITU WITH CALCIFICATIONS AND NECROSIS - SEE COMMENT 2. Breast, right, needle core biopsy, mid-anterior extent - INVASIVE DUCTAL CARCINOMA - DUCTAL CARCINOMA IN SITU - SEE COMMENT Microscopic Comment 1. Based on the biopsy, the ductal carcinoma in situ has a solid pattern, high nuclear grade and measures 0.5 cm in greatest linear extent. Prognostic markers (ER/PR) are pending and will be reported in an addendum. Dr. Judd Northern reviewed the case and agrees with the above diagnosis. These results were called to [facility] on February 16, 2020. 2. Based on the biopsy, the carcinoma appears Nottingham grade 3 of 3 and measures 0.15 cm in greatest linear extent. Prognostic markers (ER/PR/ki-67/HER2) are pending and will be reported in an addendum.   02/13/2020 Receptors her2    1. PROGNOSTIC INDICATORS Results: IMMUNOHISTOCHEMICAL AND MORPHOMETRIC ANALYSIS PERFORMED MANUALLY Estrogen Receptor: >95%, POSITIVE, MODERATE-STRONG STAINING INTENSITY Progesterone Receptor: 5%, POSITIVE, MODERATE-STRONG STAINING INTENSITY  2. PROGNOSTIC INDICATORS Results: IMMUNOHISTOCHEMICAL AND MORPHOMETRIC ANALYSIS PERFORMED MANUALLY The tumor cells are NEGATIVE for Her2 (0). Estrogen Receptor: >95%, POSITIVE, STRONG STAINING INTENSITY Progesterone Receptor: 60%, POSITIVE, STRONG STAINING INTENSITY Proliferation Marker Ki67: 1%   02/21/2020 Breast MRI   Breast MRI 02/21/20  IMPRESSION: 1.  Involving the sites of biopsy proven DCIS in the medial right breast there is a large area of ill-defined non mass enhancement middle to anterior depth, somewhat difficult to measure due to irregular margins and background enhancement but measures at least 6.3 x 3.3 x 2.0 cm. 2. No MRI evidence of malignancy in the left breast. 3. No axillary adenopathy.   02/25/2020 Initial Diagnosis   Malignant neoplasm of overlapping sites of right breast (HCC)   02/25/2020 Cancer Staging   Staging form: Breast, AJCC 8th Edition - Clinical stage from 02/25/2020: Stage IIB (cT3, cN0, cM0, G3, ER+, PR+, HER2-) - Signed by Sonja [place], MD on 02/25/2020   02/27/2020 Pathology Results   Diagnosis Lymph node, needle/core biopsy, axillary - NO CARCINOMA IDENTIFIED IN NODAL TISSUE Microscopic Comment These results were called to [facility] on March 09, 2020.   04/28/2020 Surgery   RIGHT MASTECTOMY WITH SENTINEL LYMPH NODE BIOPSY by DR Alethea Andes  IMMEDIATE RIGHT BREAST RECONSTRUCTION WITH PLACEMENT OF TISSUE EXPANDER AND FLEX HD (ACELLULAR HYDRATED DERMIS) by Dr Orin Birk   04/28/2020 Pathology Results   FINAL MICROSCOPIC DIAGNOSIS:   A. 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 LYMPH NODE, RIGHT AXILLARY, SENTINEL, EXCISION:  -  No carcinoma identified in one lymph node (0/1)    04/28/2020 Cancer Staging   Staging form: Breast, AJCC 8th Edition - Pathologic stage from 04/28/2020: Stage IA (pT1a, pN0, cM0, G2, ER+, PR+, HER2-) - Signed by Maryalice Smaller,  Gracie Lav, MD on 05/06/2020 Histologic grading system: 3 grade system   07/10/2020 -  Anti-estrogen oral therapy   Tamoxifen  20mg  once daily starting 07/10/20   11/04/2020  Survivorship   SCP delivered by Lacie Burton, NP      Discussed the use of AI scribe software for clinical note transcription with the patient, who gave verbal consent to proceed.  History of Present Illness Gail Erickson is a 48 year old female with breast cancer who presents for follow-up.  She is on tamoxifen  and experiences night sweats and occasional knee pain. Her last mammogram was in December, and she had an MRI in May 2024. She has dense breast tissue and underwent a lumpectomy and reduction on the left side.  She has vitamin B12 deficiency with symptoms of numbness and circulation issues. She has not been consistent with oral B12 supplementation and has never received a B12 injection.  She has mild anemia with a hemoglobin level of 11.4 and experiences fatigue. She has not been taking oral iron supplements. Her menstrual periods are still present, with one or two days of heavy flow.     All other systems were reviewed with the patient and are negative.  MEDICAL HISTORY:  Past Medical History:  Diagnosis Date   Abnormal Pap smear of cervix 06/02/2014   LGSIL:neg HR HPV (1st abn. pap)   Back pain    Breast cancer (HCC) 2022   Cancer (HCC)    Cyst of right breast    --stable in 2015 U/S done in [place], [place]   Elevated DHEA 05/21/2015   level = 343   Family history of breast cancer    High cholesterol    HSV-1 (herpes simplex virus 1) infection    oral HSV   Joint pain    Migraine without aura    PCOS (polycystic ovarian syndrome)    Prediabetes 2021   sees Dr.Balan   Swelling of lower extremity    Vitamin D  deficiency 05/21/2015   level = 10    SURGICAL HISTORY: Past Surgical History:  Procedure Laterality Date   BREAST EXCISIONAL BIOPSY Left 10/21/2021   BREAST LUMPECTOMY WITH RADIOACTIVE SEED LOCALIZATION Left 10/21/2021   Procedure: LEFT BREAST RADIOACTIVE SEED LOCALIZED LUMPECTOMY;  Surgeon: Caralyn Chandler, MD;  Location: [facility];   Service: General;  Laterality: Left;   BREAST RECONSTRUCTION WITH PLACEMENT OF TISSUE EXPANDER AND FLEX HD (ACELLULAR HYDRATED DERMIS) Right 04/28/2020   Procedure: IMMEDIATE RIGHT BREAST RECONSTRUCTION WITH PLACEMENT OF TISSUE EXPANDER AND FLEX HD (ACELLULAR HYDRATED DERMIS);  Surgeon: Thornell Flirt, DO;  Location: [facility];  Service: Plastics;  Laterality: Right;   COLONOSCOPY WITH PROPOFOL  N/A 06/07/2021   Procedure: COLONOSCOPY WITH PROPOFOL ;  Surgeon: Vinetta Greening, DO;  [facility];  Service: Endoscopy;  Laterality: N/A;  10:30 / ASA 2   COLPOSCOPY     LIPOSUCTION WITH LIPOFILLING Right 02/17/2021   Procedure: Lipofilling of right breast for symmetry;  Surgeon: Thornell Flirt, DO;  Location: [facility];  Service: Plastics;  Laterality: Right;  1.5 hour   MASTECTOMY Right 04/28/2020   MASTECTOMY W/ SENTINEL NODE BIOPSY Right 04/28/2020   Procedure: RIGHT MASTECTOMY WITH SENTINEL LYMPH NODE BIOPSY;  Surgeon: Caralyn Chandler, MD;  Location: [facility];  Service: General;  Laterality: Right;  PECTORAL BLOCK   MASTOPEXY Left 09/23/2020   Procedure: LEFT BREAST MASTOPEXY;  Surgeon: Thornell Flirt, DO;  Location: [facility];  Service: Government social research officer;  Laterality: Left;   POLYPECTOMY  06/07/2021   Procedure: POLYPECTOMY;  Surgeon: Vinetta Greening, DO;  [facility];  Service: Endoscopy;;   REDUCTION MAMMAPLASTY Left 04/28/2020   REMOVAL OF TISSUE EXPANDER AND PLACEMENT OF IMPLANT Right 09/23/2020   Procedure: REMOVAL OF TISSUE EXPANDER AND PLACEMENT OF IMPLANT;  Surgeon: Thornell Flirt, DO;  Location: [facility];  Service: Plastics;  Laterality: Right;  90 min   UMBILICAL HERNIA REPAIR     --as a child    I have reviewed the social history and family history with the patient and they are unchanged from previous note.  ALLERGIES:  has no known allergies.  MEDICATIONS:   Current Outpatient Medications  Medication Sig Dispense Refill   Aspirin-Acetaminophen -Caffeine (EXCEDRIN MIGRAINE PO) Take 1 tablet by mouth daily as needed (headache).     Cetirizine HCl (ZYRTEC PO) Take by mouth.     Cholecalciferol (VITAMIN D3) 25 MCG (1000 UT) CAPS Take 1 capsule (1,000 Units total) by mouth daily. 60 capsule 3   Cyanocobalamin  (VITAMIN B 12 PO) Take by mouth.     Efinaconazole  10 % SOLN Apply 1 drop topically daily. 4 mL 11   fluticasone  (FLONASE ) 50 MCG/ACT nasal spray Place 2 sprays into both nostrils daily. 16 g 1   Multiple Vitamin (MULTIVITAMIN) tablet Take 1 tablet by mouth every 14 (fourteen) days.     Omega-3 Fatty Acids (FISH OIL) 1000 MG CAPS Take by mouth every 14 (fourteen) days.     tamoxifen  (NOLVADEX ) 20 MG tablet TAKE 1 TABLET(20 MG) BY MOUTH DAILY 30 tablet 1   tirzepatide  (ZEPBOUND ) 10 MG/0.5ML Pen Inject 10 mg into the skin once a week. 2 mL 0   Vitamin D , Ergocalciferol , (DRISDOL ) 1.25 MG (50000 UNIT) CAPS capsule Take 1 capsule (50,000 Units total) by mouth every 7 (seven) days. 5 capsule 0   famciclovir  (FAMVIR ) 500 MG tablet Take 1500mg  singe dose as needed for cold sore (Patient not taking: Reported on 07/16/2023) 3 tablet 3   No current facility-administered medications for this visit.    PHYSICAL EXAMINATION: ECOG PERFORMANCE STATUS: 0 - Asymptomatic  Vitals:   07/16/23 0820  BP: 138/88  Pulse: 79  Resp: 17  Temp: 97.6 F (36.4 C)  SpO2: 99%   Wt Readings from Last 3 Encounters:  07/16/23 239 lb 6.4 oz (108.6 kg)  07/10/23 236 lb (107 kg)  06/06/23 237 lb 6.4 oz (107.7 kg)     GENERAL:alert, no distress and comfortable SKIN: skin color, texture, turgor are normal, no rashes or significant lesions EYES: normal, Conjunctiva are pink and non-injected, sclera clear NECK: supple, thyroid  normal size, non-tender, without nodularity LYMPH:  no palpable lymphadenopathy in the cervical, axillary  LUNGS: clear to auscultation and  percussion with normal breathing effort HEART: regular rate & rhythm and no murmurs and no lower extremity edema ABDOMEN:abdomen soft, non-tender and normal bowel sounds Musculoskeletal:no cyanosis of digits and no clubbing  NEURO: alert & oriented x 3 with fluent speech, no focal motor/sensory deficits BREAST: Scar tissue present. No masses or abnormalities detected. Dense breast tissue with small lumps around nipple, normal. Physical Exam   LABORATORY DATA:  I have reviewed the data as listed    Latest Ref Rng & Units 07/16/2023    8:06 AM 01/15/2023    8:32 AM 03/16/2022   10:49 AM  CBC  WBC 4.0 - 10.5 K/uL 5.1  4.3  4.2   Hemoglobin 12.0 -  15.0 g/dL 16.1  09.6  04.5   Hematocrit 36.0 - 46.0 % 33.2  34.1  34.1   Platelets 150 - 400 K/uL 267  265  256         Latest Ref Rng & Units 07/16/2023    8:06 AM 01/15/2023    8:32 AM 03/16/2022   10:49 AM  CMP  Glucose 70 - 99 mg/dL 95  89  81   BUN 6 - 20 mg/dL 9  10  10    Creatinine 0.44 - 1.00 mg/dL 4.09  8.11  9.14   Sodium 135 - 145 mmol/L 139  140  142   Potassium 3.5 - 5.1 mmol/L 3.9  4.1  3.9   Chloride 98 - 111 mmol/L 110  109  109   CO2 22 - 32 mmol/L 24  27  28    Calcium 8.9 - 10.3 mg/dL 8.9  8.8  9.5   Total Protein 6.5 - 8.1 g/dL 7.0  7.1  6.9   Total Bilirubin 0.0 - 1.2 mg/dL 0.6  0.7  0.7   Alkaline Phos 38 - 126 U/L 39  40  40   AST 15 - 41 U/L 15  20  21    ALT 0 - 44 U/L 14  15  16        RADIOGRAPHIC STUDIES: I have personally reviewed the radiological images as listed and agreed with the findings in the report. No results found.    Orders Placed This Encounter  Procedures   MR BREAST BILATERAL W WO CONTRAST INC CAD    Standing Status:   Future    Expected Date:   08/16/2023    Expiration Date:   07/15/2024    If indicated for the ordered procedure, I authorize the administration of contrast media per Radiology protocol:   Yes    What is the patient's sedation requirement?:   No Sedation    Does the patient  have a pacemaker or implanted devices?:   No    Preferred imaging location?:   [facility] (table limit-550lbs)   MM Digital Screening    Contrast enhanced MM    Standing Status:   Future    Expected Date:   02/15/2024    Expiration Date:   07/15/2024    Reason for Exam (SYMPTOM  OR DIAGNOSIS REQUIRED):   screening    Is the patient pregnant?:   No    Preferred imaging [facility]   Vitamin B12    Standing Status:   Standing    Number of Occurrences:   10    Expiration Date:   07/15/2024   Ferritin    Standing Status:   Standing    Number of Occurrences:   20    Expiration Date:   07/15/2024   All questions were answered. The patient knows to call the clinic with any problems, questions or concerns. No barriers to learning was detected. The total time spent in the appointment was 30 minutes.     Sonja [place], MD 07/16/2023

## 2023-07-19 ENCOUNTER — Other Ambulatory Visit: Payer: Self-pay

## 2023-07-19 DIAGNOSIS — Z1231 Encounter for screening mammogram for malignant neoplasm of breast: Secondary | ICD-10-CM

## 2023-07-19 NOTE — Progress Notes (Signed)
 Faxed order for Contrast Enhanced Mamm[facility] to [facility] as per Dr. Sonja [place]. Received confirmation of fax receipt.

## 2023-07-31 ENCOUNTER — Inpatient Hospital Stay

## 2023-07-31 DIAGNOSIS — C50811 Malignant neoplasm of overlapping sites of right female breast: Secondary | ICD-10-CM | POA: Diagnosis not present

## 2023-07-31 DIAGNOSIS — E538 Deficiency of other specified B group vitamins: Secondary | ICD-10-CM

## 2023-07-31 MED ORDER — CYANOCOBALAMIN 1000 MCG/ML IJ SOLN
1000.0000 ug | Freq: Once | INTRAMUSCULAR | Status: AC
  Administered 2023-07-31: 1000 ug via INTRAMUSCULAR
  Filled 2023-07-31: qty 1

## 2023-08-02 ENCOUNTER — Other Ambulatory Visit: Payer: Self-pay | Admitting: Internal Medicine

## 2023-08-02 DIAGNOSIS — J329 Chronic sinusitis, unspecified: Secondary | ICD-10-CM

## 2023-08-07 ENCOUNTER — Encounter: Payer: Self-pay | Admitting: Bariatrics

## 2023-08-07 ENCOUNTER — Ambulatory Visit: Admitting: Podiatry

## 2023-08-07 ENCOUNTER — Ambulatory Visit: Admitting: Bariatrics

## 2023-08-07 ENCOUNTER — Encounter: Payer: Self-pay | Admitting: Podiatry

## 2023-08-07 VITALS — Ht 68.0 in | Wt 235.0 lb

## 2023-08-07 VITALS — BP 118/74 | HR 67 | Temp 97.9°F | Ht 68.0 in | Wt 235.0 lb

## 2023-08-07 DIAGNOSIS — R632 Polyphagia: Secondary | ICD-10-CM | POA: Diagnosis not present

## 2023-08-07 DIAGNOSIS — E669 Obesity, unspecified: Secondary | ICD-10-CM | POA: Diagnosis not present

## 2023-08-07 DIAGNOSIS — B351 Tinea unguium: Secondary | ICD-10-CM | POA: Diagnosis not present

## 2023-08-07 DIAGNOSIS — E559 Vitamin D deficiency, unspecified: Secondary | ICD-10-CM | POA: Diagnosis not present

## 2023-08-07 DIAGNOSIS — Z6835 Body mass index (BMI) 35.0-35.9, adult: Secondary | ICD-10-CM

## 2023-08-07 MED ORDER — ZEPBOUND 10 MG/0.5ML ~~LOC~~ SOAJ: 10.0000 mg | SUBCUTANEOUS | 0 refills | Status: DC

## 2023-08-07 MED ORDER — VITAMIN D (ERGOCALCIFEROL) 1.25 MG (50000 UNIT) PO CAPS: 50000.0000 [IU] | ORAL_CAPSULE | ORAL | 0 refills | Status: DC

## 2023-08-07 NOTE — Progress Notes (Signed)
 WEIGHT SUMMARY AND BIOMETRICS  Weight Lost Since Last Visit: 1lb  Weight Gained Since Last Visit: 0   Vitals Temp: 97.9 F (36.6 C) BP: 118/74 Pulse Rate: 67 SpO2: 99 %   Anthropometric Measurements Height: 5\' 8"  (1.727 m) Weight: 235 lb (106.6 kg) BMI (Calculated): 35.74 Weight at Last Visit: 236lb Weight Lost Since Last Visit: 1lb Weight Gained Since Last Visit: 0 Starting Weight: 241lb Total Weight Loss (lbs): 6 lb (2.722 kg)   Body Composition  Body Fat %: 43.1 % Fat Mass (lbs): 101.4 lbs Muscle Mass (lbs): 127 lbs Total Body Water (lbs): 91.6 lbs Visceral Fat Rating : 11   Other Clinical Data Fasting: no Labs: no Today's Visit #: 22 Starting Date: 09/14/21    OBESITY Eupha is here to discuss her progress with her obesity treatment plan along with follow-up of her obesity related diagnoses.    Nutrition Plan: the Category 2 plan - 40% adherence.  Current exercise: walking  Interim History:  She is down 1 pound since her last visit.  Eating all of the food on the plan., Protein intake is as prescribed, Is not skipping meals, and Not journaling consistently.   Pharmacotherapy: Alyxandra is on Zepbound  10 mg SQ weekly Adverse side effects: None Hunger is moderately controlled.  Cravings are moderately controlled.  Assessment/Plan:   Vitamin D  Deficiency Vitamin D  is not at goal of 50.  Most recent vitamin D  level was 31.2. She is on  prescription ergocalciferol  50,000 IU weekly. Lab Results  Component Value Date   VD25OH 31.2 05/17/2022   VD25OH 27.3 (L) 09/14/2021   VD25OH 23 (L) 07/28/2021    Plan: Refill prescription vitamin D  50,000 IU weekly.  Will get some minimal sun exposure. Will increase foods that are fortified with vitamin D  and or natural foods.   Polyphagia Reniyah endorses excessive hunger.  Medication(s): Zepbound   10 mg  Effects of medication:  moderately controlled. Cravings are moderately controlled.   Plan: Medication(s): Zepbound  10 mg SQ weekly Will increase water, protein and fiber to help assuage hunger.  Will minimize foods that have a high glucose index/load to minimize reactive hypoglycemia.  Will continue to cook more meals at home. Discussed healthy fats versus not healthy fats and information sheet given.  She will continue her hiking and will dedicate more time to exercise.   Generalized Obesity: Current BMI BMI (Calculated): 35.74   Pharmacotherapy Plan Continue and refill  Zepbound  10 mg SQ weekly  Emeree is currently in the action stage of change. As such, her goal is to continue with weight loss efforts.  She has agreed to the Category 2 plan.  Exercise goals: For substantial health benefits, adults should do at least 150 minutes (2 hours and 30 minutes) a week of moderate-intensity, or 75 minutes (1 hour and 15 minutes) a week of vigorous-intensity aerobic physical activity, or  an equivalent combination of moderate- and vigorous-intensity aerobic activity. Aerobic activity should be performed in episodes of at least 10 minutes, and preferably, it should be spread throughout the week.  Behavioral modification strategies: increasing lean protein intake, decreasing simple carbohydrates , no meal skipping, decrease eating out, meal planning , increase water intake, better snacking choices, planning for success, avoiding temptations, keep healthy foods in the home, weigh protein portions, and mindful eating.  Santina has agreed to follow-up with our clinic in 4 weeks.    Objective:   VITALS: Per patient if applicable, see vitals. GENERAL: Alert and in no acute distress. CARDIOPULMONARY: No increased WOB. Speaking in clear sentences.  PSYCH: Pleasant and cooperative. Speech normal rate and rhythm. Affect is appropriate. Insight and judgement are appropriate. Attention is focused,  linear, and appropriate.  NEURO: Oriented as arrived to appointment on time with no prompting.   Attestation Statements:   This was prepared with the assistance of Engineer, civil (consulting).  Occasional wrong-word or sound-a-like substitutions may have occurred due to the inherent limitations of voice recognition   Kirk Peper, DO

## 2023-08-11 NOTE — Progress Notes (Signed)
  Subjective:  Patient ID: Gail Erickson, female    DOB: [birth year],  MRN: [id number]  Chief Complaint  Patient presents with   Nail Problem    RM4: follow up after nail fungus treatment. Still the same , some pain in big toe    48 y.o. female presents with the above complaint. History confirmed with patient.  Doing much better  Objective:  Physical Exam: warm, good capillary refill, no trophic changes or ulcerative lesions, normal DP and PT pulses, normal sensory exam, and significant improvement in onychomycosis       Assessment:   1. Onychomycosis      Plan:  Patient was evaluated and treated and all questions answered.  Doing well.  Should not need antifungal therapy further at this point.  Return as needed if it returns or worsens.  Okay to continue Jublia  as needed  Return if symptoms worsen or fail to improve.

## 2023-08-16 ENCOUNTER — Ambulatory Visit
Admission: RE | Admit: 2023-08-16 | Discharge: 2023-08-16 | Disposition: A | Discharge status: Home / Self Care | Source: Ambulatory Visit | Attending: Hematology | Admitting: Hematology

## 2023-08-16 DIAGNOSIS — Z9189 Other specified personal risk factors, not elsewhere classified: Secondary | ICD-10-CM

## 2023-08-16 MED ORDER — GADOPICLENOL 0.5 MMOL/ML IV SOLN
10.0000 mL | Freq: Once | INTRAVENOUS | Status: AC | PRN
Start: 2023-08-16 — End: 2023-08-16
  Administered 2023-08-16: 10 mL via INTRAVENOUS

## 2023-08-28 ENCOUNTER — Inpatient Hospital Stay: Attending: Nurse Practitioner

## 2023-08-28 DIAGNOSIS — E538 Deficiency of other specified B group vitamins: Secondary | ICD-10-CM | POA: Insufficient documentation

## 2023-08-28 MED ORDER — CYANOCOBALAMIN 1000 MCG/ML IJ SOLN
1000.0000 ug | Freq: Once | INTRAMUSCULAR | Status: AC
  Administered 2023-08-28: 1000 ug via INTRAMUSCULAR
  Filled 2023-08-28: qty 1

## 2023-09-03 NOTE — Progress Notes (Unsigned)
 48 y.o. G0P0000 Single African American female here for annual exam. Would like to get STD testing, would like to get a pap, would like to discuss her period was longer this time around. Also has been constipated.  On Zepbound .   Hx BV.  No odor.  Brown dark discharge.  On Tamoxifen  since 2022.   Bleeding started on 08/21/23 and is continuing with dark brown spotting.  Usually menses last 4 days with then 2 days of spotting following this.  No bleeding in between cycles.    Really wants a pap.  She understands insurance may not pay.   PCP: Zarwolo, Gloria, FNP   Patient's last menstrual period was 08/21/2023 (exact date).     Period Cycle (Days): 28 Period Duration (Days): 4 Period Pattern: Regular Menstrual Flow: Light (Light to heavy - normally light) Menstrual Control: Maxi pad Dysmenorrhea: (!) Mild Dysmenorrhea Symptoms: Cramping, Nausea     Sexually active: Yes.    The current method of family planning is condoms everytime.    Menopausal hormone therapy:  n/a Exercising: Yes.    Walking Smoker:  no  OB History  Gravida Para Term Preterm AB Living  0 0 0 0 0 0  SAB IAB Ectopic Multiple Live Births  0 0 0 0      HEALTH MAINTENANCE: Last 2 paps:  08/03/22 neg HR HPV neg, 07/28/21 neg HR HPV neg History of abnormal Pap or positive HPV:  yes, 07/26/20 ASCUS, HR HPV neg Mammogram:   08/16/23 Breast Density Cat B, BIRADS Cat 2 benign  Colonoscopy:  06/07/21 - due in 2033. Bone Density:  n/a  Result  n/a   Immunization History  Administered Date(s) Administered   Influenza, Seasonal, Injecte, Preservative Fre 01/15/2023   Influenza,inj,quad, With Preservative 12/24/2017   PFIZER(Purple Top)SARS-COV-2 Vaccination 05/18/2019, 06/08/2019   Tdap 05/21/2015      reports that she has never smoked. She has never used smokeless tobacco. She reports current alcohol use. She reports that she does not use drugs.  Past Medical History:  Diagnosis Date   Abnormal Pap smear of  cervix 06/02/2014   LGSIL:neg HR HPV (1st abn. pap)   Back pain    Breast cancer (HCC) 2022   Cancer (HCC)    Cyst of right breast    --stable in 2015 U/S done in [place], [place]   Elevated DHEA 05/21/2015   level = 343   Family history of breast cancer    High cholesterol    HSV-1 (herpes simplex virus 1) infection    oral HSV   Joint pain    Migraine without aura    PCOS (polycystic ovarian syndrome)    Prediabetes 2021   sees Dr.Balan   Swelling of lower extremity    Vitamin D  deficiency 05/21/2015   level = 10    Past Surgical History:  Procedure Laterality Date   BREAST EXCISIONAL BIOPSY Left 10/21/2021   BREAST LUMPECTOMY WITH RADIOACTIVE SEED LOCALIZATION Left 10/21/2021   Procedure: LEFT BREAST RADIOACTIVE SEED LOCALIZED LUMPECTOMY;  Surgeon: [name], MD;  Location: [facility];  Service: General;  Laterality: Left;   BREAST RECONSTRUCTION WITH PLACEMENT OF TISSUE EXPANDER AND FLEX HD (ACELLULAR HYDRATED DERMIS) Right 04/28/2020   Procedure: IMMEDIATE RIGHT BREAST RECONSTRUCTION WITH PLACEMENT OF TISSUE EXPANDER AND FLEX HD (ACELLULAR HYDRATED DERMIS);  Surgeon: Lowery Estefana RAMAN, DO;  Location: [facility];  Service: Plastics;  Laterality: Right;   COLONOSCOPY WITH PROPOFOL  N/A 06/07/2021  Procedure: COLONOSCOPY WITH PROPOFOL ;  Surgeon: Cindie Carlin POUR, DO;  [facility];  Service: Endoscopy;  Laterality: N/A;  10:30 / ASA 2   COLPOSCOPY     LIPOSUCTION WITH LIPOFILLING Right 02/17/2021   Procedure: Lipofilling of right breast for symmetry;  Surgeon: Lowery Estefana RAMAN, DO;  Location: [facility];  Service: Plastics;  Laterality: Right;  1.5 hour   MASTECTOMY Right 04/28/2020   MASTECTOMY W/ SENTINEL NODE BIOPSY Right 04/28/2020   Procedure: RIGHT MASTECTOMY WITH SENTINEL LYMPH NODE BIOPSY;  Surgeon: [name], MD;  Location: [facility];  Service: General;  Laterality: Right;  PECTORAL  BLOCK   MASTOPEXY Left 09/23/2020   Procedure: LEFT BREAST MASTOPEXY;  Surgeon: Lowery Estefana RAMAN, DO;  Location: [facility];  Service: Plastics;  Laterality: Left;   POLYPECTOMY  06/07/2021   Procedure: POLYPECTOMY;  Surgeon: Cindie Carlin POUR, DO;  [facility]: [facility];  Service: Endoscopy;;   REDUCTION MAMMAPLASTY Left 04/28/2020   REMOVAL OF TISSUE EXPANDER AND PLACEMENT OF IMPLANT Right 09/23/2020   Procedure: REMOVAL OF TISSUE EXPANDER AND PLACEMENT OF IMPLANT;  Surgeon: Lowery Estefana RAMAN, DO;  Location: [facility];  Service: Plastics;  Laterality: Right;  90 min   UMBILICAL HERNIA REPAIR     --as a child    Current Outpatient Medications  Medication Sig Dispense Refill   Aspirin-Acetaminophen -Caffeine (EXCEDRIN MIGRAINE PO) Take 1 tablet by mouth daily as needed (headache).     Cetirizine HCl (ZYRTEC PO) Take by mouth.     Cholecalciferol (VITAMIN D3) 25 MCG (1000 UT) CAPS Take 1 capsule (1,000 Units total) by mouth daily. 60 capsule 3   Cyanocobalamin  (VITAMIN B 12 PO) Take by mouth.     Efinaconazole  10 % SOLN Apply 1 drop topically daily. 4 mL 11   fluticasone  (FLONASE ) 50 MCG/ACT nasal spray SHAKE LIQUID AND USE 2 SPRAYS IN EACH NOSTRIL DAILY 16 g 1   Multiple Vitamin (MULTIVITAMIN) tablet Take 1 tablet by mouth every 14 (fourteen) days.     Omega-3 Fatty Acids (FISH OIL) 1000 MG CAPS Take by mouth every 14 (fourteen) days.     tamoxifen  (NOLVADEX ) 20 MG tablet TAKE 1 TABLET(20 MG) BY MOUTH DAILY 30 tablet 1   tirzepatide  (ZEPBOUND ) 10 MG/0.5ML Pen Inject 10 mg into the skin once a week. 6 mL 0   Vitamin D , Ergocalciferol , (DRISDOL ) 1.25 MG (50000 UNIT) CAPS capsule Take 1 capsule (50,000 Units total) by mouth every 7 (seven) days. 5 capsule 0   famciclovir  (FAMVIR ) 500 MG tablet Take 1500mg  singe dose as needed for cold sore (Patient not taking: Reported on 09/04/2023) 3 tablet 3   No current facility-administered medications for this  visit.    ALLERGIES: Patient has no known allergies.  Family History  Problem Relation Age of Onset   Breast cancer Mother 27   Diabetes Mother    Hypertension Mother    Hyperlipidemia Mother    Thyroid  disease Mother    Transient ischemic attack Mother    Hyperlipidemia Father    High blood pressure Father    Aneurysm Maternal Aunt 80       Brain   Breast cancer Maternal Aunt 72       Breast cancer   HIV/AIDS Maternal Uncle    Kidney disease Maternal Uncle    Breast cancer Paternal Aunt 45       bil. Breast ca--deceased CHF   Cancer Paternal Uncle  NOS - needed BMT   Cancer Maternal Grandmother 16       adrenal gland cancer   Breast cancer Paternal Grandmother        dx in her mid to late 100s   Breast cancer Cousin 40       maternal first cousin died Breast ca age 28   Cancer Other        2 maternal great uncles with cancer NOS    Review of Systems  All other systems reviewed and are negative.   PHYSICAL EXAM:  BP 118/76 (BP Location: Left Arm, Patient Position: Sitting)   Pulse 73   Ht 5' 9 (1.753 m)   Wt 237 lb (107.5 kg)   LMP 08/21/2023 (Exact Date)   SpO2 96%   BMI 35.00 kg/m     General appearance: alert, cooperative and appears stated age Head: normocephalic, without obvious abnormality, atraumatic Neck: no adenopathy, supple, symmetrical, trachea midline and thyroid  normal to inspection and palpation Lungs: clear to auscultation bilaterally Breasts: right breast absent and implant present, no axillary adenopathy.  Left breast consistent with mastopexy surgery, no masses or tenderness, No nipple retraction or dimpling, No nipple discharge or bleeding, No axillary adenopathy Heart: regular rate and rhythm Abdomen: soft, non-tender; no masses, no organomegaly Extremities: extremities normal, atraumatic, no cyanosis or edema Skin: skin color, texture, turgor normal. No rashes or lesions Lymph nodes: cervical, supraclavicular, and axillary nodes  normal. Neurologic: grossly normal  Pelvic: External genitalia:  no lesions              No abnormal inguinal nodes palpated.              Urethra:  normal appearing urethra with no masses, tenderness or lesions              Bartholins and Skenes: normal                 Vagina: normal appearing vagina with normal color and discharge, no lesions              Cervix: no lesions              Pap taken: yes Bimanual Exam:  Uterus:  normal size, contour, position, consistency, mobility, non-tender              Adnexa: no mass, fullness, tenderness              Rectal exam: yes.  Confirms.              Anus:  normal sphincter tone, no lesions  Chaperone was present for exam:  Kari HERO, CMA  ASSESSMENT: Well woman visit with gynecologic exam. Hx prior LGSIL.  Neg HR HPV.  Abnormal menstruation.  Perimenopause versus possible Tamoxifen  effect. Cervical cancer screening.  Vaginal discharge. Hx elevated DHEAS.  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  Care through endocrinology.  Prediabetes.  Right breast cancer.  Negative genetic testing.  Status post right mastectomy with left mastopexy.  On Tamoxifen .  HSV 1.  Hx migraine without aura.  Hx UTI. PHQ-2-9: 0 Constipation.   PLAN: Mammogram screening discussed. Self breast awareness reviewed. Pap and HRV collected:  yes Swab for yeast, BV.  Guidelines for Calcium, Vitamin D , regular exercise program including cardiovascular and weight bearing exercise. Medication refills:  Valtrex 2000 mg po bid x 24 hours prn HSV I outbreak.  STD screening.  Return for pelvic ultrasound and possible endometrial biopsy.  Increase water, fiber. Consider probiotic, Miralax .  Tucks  pad and rectal hydrocortisone.  Follow up:  yearly and prn.

## 2023-09-04 ENCOUNTER — Other Ambulatory Visit (HOSPITAL_COMMUNITY)
Admission: RE | Admit: 2023-09-04 | Discharge: 2023-09-04 | Disposition: A | Discharge status: Home / Self Care | Source: Ambulatory Visit | Attending: Obstetrics and Gynecology | Admitting: Obstetrics and Gynecology

## 2023-09-04 ENCOUNTER — Encounter: Payer: Self-pay | Admitting: Obstetrics and Gynecology

## 2023-09-04 ENCOUNTER — Encounter: Payer: Self-pay | Admitting: Bariatrics

## 2023-09-04 ENCOUNTER — Ambulatory Visit (INDEPENDENT_AMBULATORY_CARE_PROVIDER_SITE_OTHER): Admitting: Obstetrics and Gynecology

## 2023-09-04 ENCOUNTER — Ambulatory Visit: Admitting: Bariatrics

## 2023-09-04 VITALS — BP 118/76 | HR 73 | Ht 69.0 in | Wt 237.0 lb

## 2023-09-04 VITALS — BP 100/67 | HR 70 | Temp 98.1°F | Ht 68.0 in | Wt 234.0 lb

## 2023-09-04 DIAGNOSIS — Z124 Encounter for screening for malignant neoplasm of cervix: Secondary | ICD-10-CM | POA: Insufficient documentation

## 2023-09-04 DIAGNOSIS — E669 Obesity, unspecified: Secondary | ICD-10-CM

## 2023-09-04 DIAGNOSIS — Z1159 Encounter for screening for other viral diseases: Secondary | ICD-10-CM

## 2023-09-04 DIAGNOSIS — Z6835 Body mass index (BMI) 35.0-35.9, adult: Secondary | ICD-10-CM

## 2023-09-04 DIAGNOSIS — Z1331 Encounter for screening for depression: Secondary | ICD-10-CM

## 2023-09-04 DIAGNOSIS — Z01419 Encounter for gynecological examination (general) (routine) without abnormal findings: Secondary | ICD-10-CM | POA: Diagnosis not present

## 2023-09-04 DIAGNOSIS — E559 Vitamin D deficiency, unspecified: Secondary | ICD-10-CM

## 2023-09-04 DIAGNOSIS — Z114 Encounter for screening for human immunodeficiency virus [HIV]: Secondary | ICD-10-CM

## 2023-09-04 DIAGNOSIS — Z113 Encounter for screening for infections with a predominantly sexual mode of transmission: Secondary | ICD-10-CM | POA: Diagnosis not present

## 2023-09-04 DIAGNOSIS — N926 Irregular menstruation, unspecified: Secondary | ICD-10-CM | POA: Diagnosis not present

## 2023-09-04 DIAGNOSIS — N898 Other specified noninflammatory disorders of vagina: Secondary | ICD-10-CM | POA: Diagnosis present

## 2023-09-04 DIAGNOSIS — R632 Polyphagia: Secondary | ICD-10-CM

## 2023-09-04 DIAGNOSIS — Z79810 Long term (current) use of selective estrogen receptor modulators (SERMs): Secondary | ICD-10-CM | POA: Diagnosis not present

## 2023-09-04 DIAGNOSIS — B001 Herpesviral vesicular dermatitis: Secondary | ICD-10-CM

## 2023-09-04 MED ORDER — VITAMIN D (ERGOCALCIFEROL) 1.25 MG (50000 UNIT) PO CAPS: 50000.0000 [IU] | ORAL_CAPSULE | ORAL | 0 refills | Status: DC

## 2023-09-04 MED ORDER — ZEPBOUND 10 MG/0.5ML ~~LOC~~ SOAJ: 10.0000 mg | SUBCUTANEOUS | 0 refills | Status: DC

## 2023-09-04 MED ORDER — VALACYCLOVIR HCL 500 MG PO TABS: ORAL_TABLET | ORAL | 3 refills | Status: AC

## 2023-09-04 NOTE — Progress Notes (Unsigned)
                                                                                                              WEIGHT SUMMARY AND BIOMETRICS  Weight Lost Since Last Visit: 1lb  Weight Gained Since Last Visit: 0   Vitals Temp: 98.1 F (36.7 C) BP: 100/67 Pulse Rate: 70 SpO2: 99 %   Anthropometric Measurements Height: 5' 8 (1.727 m) Weight: 234 lb (106.1 kg) BMI (Calculated): 35.59 Weight at Last Visit: 235lb Weight Lost Since Last Visit: 1lb Weight Gained Since Last Visit: 0 Starting Weight: 241lb Total Weight Loss (lbs): 7 lb (3.175 kg)   Body Composition  Body Fat %: 43.3 % Fat Mass (lbs): 101.6 lbs Muscle Mass (lbs): 126.4 lbs Total Body Water (lbs): 96.2 lbs Visceral Fat Rating : 11   Other Clinical Data Fasting: no Labs: no Today's Visit #: 23 Starting Date: 09/14/21    OBESITY Gail Erickson is here to discuss her progress with her obesity treatment plan along with follow-up of her obesity related diagnoses.    Nutrition Plan: the Category 2 plan - 30% adherence.  Current exercise: none  Interim History:  She is down 1 lb since her last visit.  {aabnutritionassessment:29213}   Pharmacotherapy: Gail Erickson is on {dwwpharmacotherapy:29109} Adverse side effects: {dwwse:29122} Hunger is {EWCONTROLASSESSMENT:24261}.  Cravings are {EWCONTROLASSESSMENT:24261}.  Assessment/Plan:   Gail Erickson endorses excessive hunger.  Medication(s): *** Effects of medication:  {EWCONTROLASSESSMENT:24261}. Cravings are {EWCONTROLASSESSMENT:24261}.   Plan: Medication(s): {dwwpharmacotherapy:29109} Will increase water, protein and fiber to help assuage hunger.  Will minimize foods that have a high glucose index/load to minimize reactive hypoglycemia.   Vitamin D  Deficiency Vitamin D  {CHL AMB IS/IS NOT:210130109} at goal of 50.  Most recent vitamin D  level was ***. She is on {dwwslvitdrx:29084}. Lab Results  Component Value Date   VD25OH 31.2 05/17/2022   VD25OH  27.3 (L) 09/14/2021   VD25OH 23 (L) 07/28/2021    Plan: {dwwslrf:29083::Refill} prescription vitamin D  50,000 IU weekly.      {dwwmorbid:29108::Morbid Obesity}: Current BMI BMI (Calculated): 35.59   Pharmacotherapy Plan {dwwmed:29123}  {dwwpharmacotherapy:29109}  Gail Erickson {CHL AMB IS/IS NOT:210130109} currently in the action stage of change. As such, her goal is to {MWMwtloss#1:210800005}.  She has agreed to {dwwsldiets:29085}.  Exercise goals: {MWM EXERCISE RECS:23473}  Behavioral modification strategies: {dwwslwtlossstrategies:29088}.  Gail Erickson has agreed to follow-up with our clinic in {NUMBER 1-10:22536} weeks.    Objective:   VITALS: Per patient if applicable, see vitals. GENERAL: Alert and in no acute distress. CARDIOPULMONARY: No increased WOB. Speaking in clear sentences.  PSYCH: Pleasant and cooperative. Speech normal rate and rhythm. Affect is appropriate. Insight and judgement are appropriate. Attention is focused, linear, and appropriate.  NEURO: Oriented as arrived to appointment on time with no prompting.   Attestation Statements:   This was prepared with the assistance of Engineer, civil (consulting).  Occasional wrong-word or sound-a-like substitutions may have occurred due to the inherent limitations of voice recognition.   Clayborne Daring, DO

## 2023-09-04 NOTE — Patient Instructions (Signed)

## 2023-09-05 ENCOUNTER — Other Ambulatory Visit: Payer: Self-pay | Admitting: Hematology

## 2023-09-05 DIAGNOSIS — C50811 Malignant neoplasm of overlapping sites of right female breast: Secondary | ICD-10-CM

## 2023-09-05 LAB — HIV ANTIBODY (ROUTINE TESTING W REFLEX): HIV 1&2 Ab, 4th Generation: NONREACTIVE

## 2023-09-05 LAB — HEPATITIS C ANTIBODY: Hepatitis C Ab: NONREACTIVE

## 2023-09-05 LAB — RPR: RPR Ser Ql: NONREACTIVE

## 2023-09-06 ENCOUNTER — Ambulatory Visit: Payer: Self-pay | Admitting: Obstetrics and Gynecology

## 2023-09-06 LAB — CYTOLOGY - PAP
Chlamydia: NEGATIVE
Comment: NEGATIVE
Comment: NEGATIVE
Comment: NORMAL
Diagnosis: NEGATIVE
High risk HPV: NEGATIVE
Neisseria Gonorrhea: NEGATIVE

## 2023-09-06 LAB — CERVICOVAGINAL ANCILLARY ONLY
Bacterial Vaginitis (gardnerella): NEGATIVE
Candida Glabrata: NEGATIVE
Candida Vaginitis: NEGATIVE
Comment: NEGATIVE
Comment: NEGATIVE
Comment: NEGATIVE
Comment: NEGATIVE
Trichomonas: NEGATIVE

## 2023-09-25 ENCOUNTER — Inpatient Hospital Stay: Attending: Nurse Practitioner

## 2023-09-25 DIAGNOSIS — E538 Deficiency of other specified B group vitamins: Secondary | ICD-10-CM | POA: Insufficient documentation

## 2023-09-25 MED ORDER — CYANOCOBALAMIN 1000 MCG/ML IJ SOLN
1000.0000 ug | Freq: Once | INTRAMUSCULAR | Status: AC
  Administered 2023-09-25: 1000 ug via INTRAMUSCULAR
  Filled 2023-09-25: qty 1

## 2023-10-02 ENCOUNTER — Ambulatory Visit: Admitting: Bariatrics

## 2023-10-02 ENCOUNTER — Encounter: Payer: Self-pay | Admitting: Bariatrics

## 2023-10-02 VITALS — BP 119/77 | HR 67 | Temp 98.3°F | Ht 68.0 in | Wt 236.0 lb

## 2023-10-02 DIAGNOSIS — R632 Polyphagia: Secondary | ICD-10-CM | POA: Diagnosis not present

## 2023-10-02 DIAGNOSIS — Z6835 Body mass index (BMI) 35.0-35.9, adult: Secondary | ICD-10-CM

## 2023-10-02 DIAGNOSIS — E669 Obesity, unspecified: Secondary | ICD-10-CM

## 2023-10-02 DIAGNOSIS — E559 Vitamin D deficiency, unspecified: Secondary | ICD-10-CM

## 2023-10-02 MED ORDER — ZEPBOUND 12.5 MG/0.5ML ~~LOC~~ SOAJ: 12.5000 mg | SUBCUTANEOUS | 0 refills | Status: DC

## 2023-10-02 MED ORDER — VITAMIN D (ERGOCALCIFEROL) 1.25 MG (50000 UNIT) PO CAPS: 50000.0000 [IU] | ORAL_CAPSULE | ORAL | 0 refills | Status: AC

## 2023-10-02 NOTE — Progress Notes (Unsigned)
                                                                                                              WEIGHT SUMMARY AND BIOMETRICS  Weight Lost Since Last Visit: 0  Weight Gained Since Last Visit: 2lb   Vitals Temp: 98.3 F (36.8 C) BP: 119/77 Pulse Rate: 67 SpO2: 98 %   Anthropometric Measurements Height: 5' 8 (1.727 m) Weight: 236 lb (107 kg) BMI (Calculated): 35.89 Weight at Last Visit: 234lb Weight Lost Since Last Visit: 0 Weight Gained Since Last Visit: 2lb Starting Weight: 241lb Total Weight Loss (lbs): 5 lb (2.268 kg)   Body Composition  Body Fat %: 43.3 % Fat Mass (lbs): 102.6 lbs Muscle Mass (lbs): 127.4 lbs Total Body Water (lbs): 96 lbs Visceral Fat Rating : 11   Other Clinical Data Fasting: no Labs: no Today's Visit #: 24 Starting Date: 09/14/21    OBESITY Gail Erickson is here to discuss her progress with her obesity treatment plan along with follow-up of her obesity related diagnoses.    Nutrition Plan: the Category 2 plan - 40% adherence.  Current exercise: walking  Interim History:  She is up 2 lbs since her last visit.  She states that she has been on her cycle and also that her cycles have been somewhat irregular over the last few months.  She also states that she has been stretching on her Zepbound  because she is concerned about her insurance coverage.  She was previously on Wegovy  but was unable to tolerate it secondary to constipation. {aabnutritionassessment:29213}   Pharmacotherapy: Gail Erickson is on Zepbound  10 mg SQ weekly Adverse side effects: None Hunger is {EWCONTROLASSESSMENT:24261}.  Cravings are {EWCONTROLASSESSMENT:24261}.  Assessment/Plan:      {dwwmorbid:29108::Morbid Obesity}: Current BMI BMI (Calculated): 35.89   Pharmacotherapy Plan {dwwmed:29123}  {dwwpharmacotherapy:29109}  Gail Erickson {CHL AMB IS/IS NOT:210130109} currently in the action stage of change. As such, her goal is to {MWMwtloss#1:210800005}.  She has  agreed to {dwwsldiets:29085}.  Exercise goals: {MWM EXERCISE RECS:23473}  Behavioral modification strategies: {dwwslwtlossstrategies:29088}.  Gail Erickson has agreed to follow-up with our clinic in {NUMBER 1-10:22536} weeks.     Objective:   VITALS: Per patient if applicable, see vitals. GENERAL: Alert and in no acute distress. CARDIOPULMONARY: No increased WOB. Speaking in clear sentences.  PSYCH: Pleasant and cooperative. Speech normal rate and rhythm. Affect is appropriate. Insight and judgement are appropriate. Attention is focused, linear, and appropriate.  NEURO: Oriented as arrived to appointment on time with no prompting.   Attestation Statements:   This was prepared with the assistance of Engineer, civil (consulting).  Occasional wrong-word or sound-a-like substitutions may have occurred due to the inherent limitations of voice recognition   Clayborne Daring, DO

## 2023-10-03 ENCOUNTER — Telehealth (INDEPENDENT_AMBULATORY_CARE_PROVIDER_SITE_OTHER): Payer: Self-pay | Admitting: Bariatrics

## 2023-10-03 ENCOUNTER — Other Ambulatory Visit: Payer: Self-pay | Admitting: Internal Medicine

## 2023-10-03 DIAGNOSIS — J329 Chronic sinusitis, unspecified: Secondary | ICD-10-CM

## 2023-10-03 NOTE — Telephone Encounter (Signed)
 Completing PA for Gail Erickson , the preferred medication is Wegoy. Does patient have a reason not to be prescribed Wegovy ?

## 2023-10-03 NOTE — Telephone Encounter (Signed)
 The PA is for Zebound 10 mg and the prescription noted in her chart is 12.5 mg. Which is correct?

## 2023-10-03 NOTE — Telephone Encounter (Signed)
 Gail Erickson (Key: [id number])  [employer] has not yet replied to your PA request. Depending on the information you've provided, additional questions may be returned by the plan. You may close this dialog, return to your dashboard, and perform other tasks.  To check for an update later, open this request again from your dashboard.  If [employer] has not replied to your request within 24 hours please contact [employer] at [phone].

## 2023-10-08 NOTE — Progress Notes (Unsigned)
 GYNECOLOGY  VISIT   HPI: 48 y.o.   Single  African American female   [id number] with Patient's last menstrual period was 09/23/2023 (exact date).   here for: US  Consult + endometrial biopsy     On tamoxifen  since 2022.  Bleeding started 08/21/23 and came and went.   It ended the end of June.   No bleeding until her cycle on 09/23/23 and lasted 4 days.    UPT negative.   GYNECOLOGIC HISTORY: Patient's last menstrual period was 09/23/2023 (exact date). Contraception:  Condoms  Menopausal hormone therapy:  n/a Last 2 paps:  09/04/23 neg HR HPV neg, 08/03/22 neg HR HPV neg  History of abnormal Pap or positive HPV:  yes Mammogram:  02/12/23 unilateral L, Breast Density Cat C, BIRADS Cat 1 neg         OB History     Gravida  0   Para  0   Term  0   Preterm  0   AB  0   Living  0      SAB  0   IAB  0   Ectopic  0   Multiple  0   Live Births                 Patient Active Problem List   Diagnosis Date Noted   B12 deficiency 07/16/2023   Impacted cerumen of left ear 06/06/2023   Chronic sinusitis 06/06/2023   Neck pain 06/09/2022   Back pain 06/09/2022   BMI 36.0-36.9,adult 05/17/2022   Polyphagia 01/26/2022   Hirsutism 11/01/2021   Impaired fasting glucose 11/01/2021   Menopausal and female climacteric states 11/01/2021   Metabolic syndrome 11/01/2021   Pure hypercholesterolemia 11/01/2021   Mass of lower outer quadrant of left breast 10/04/2021   Fever blister 09/27/2021   Fungal infection of toenail 09/27/2021   Other fatigue 09/14/2021   SOB (shortness of breath) on exertion 09/14/2021   Prediabetes 09/14/2021   Health care maintenance 09/14/2021   Eating disorder 09/14/2021   Class 2 severe obesity with serious comorbidity and body mass index (BMI) of 36.0 to 36.9 in adult [facility]) 09/14/2021   Screening for colon cancer 03/29/2021   Generalized obesity 03/29/2021   Urine discoloration 03/29/2021   Postoperative breast asymmetry 02/25/2021    Acquired absence of breast 10/01/2020   Encounter to establish care 09/09/2020   Screening due 09/09/2020   Elevated cholesterol 09/09/2020   Vitamin D  deficiency 09/09/2020   Genetic testing 03/08/2020   Malignant neoplasm of overlapping sites of right breast (HCC) 02/25/2020   Family history of breast cancer     Past Medical History:  Diagnosis Date   Abnormal Pap smear of cervix 06/02/2014   LGSIL:neg HR HPV (1st abn. pap)   Back pain    Breast cancer (HCC) 2022   Cancer (HCC)    Cyst of right breast    --stable in 2015 U/S done in [place], [place]   Elevated DHEA 05/21/2015   level = 343   Family history of breast cancer    High cholesterol    HSV-1 (herpes simplex virus 1) infection    oral HSV   Joint pain    Migraine without aura    PCOS (polycystic ovarian syndrome)    Prediabetes 2021   sees Dr.Balan   Swelling of lower extremity    Vitamin D  deficiency 05/21/2015   level = 10    Past Surgical History:  Procedure Laterality Date   BREAST  EXCISIONAL BIOPSY Left 10/21/2021   BREAST LUMPECTOMY WITH RADIOACTIVE SEED LOCALIZATION Left 10/21/2021   Procedure: LEFT BREAST RADIOACTIVE SEED LOCALIZED LUMPECTOMY;  Surgeon: [name], MD;  Location: [facility];  Service: General;  Laterality: Left;   BREAST RECONSTRUCTION WITH PLACEMENT OF TISSUE EXPANDER AND FLEX HD (ACELLULAR HYDRATED DERMIS) Right 04/28/2020   Procedure: IMMEDIATE RIGHT BREAST RECONSTRUCTION WITH PLACEMENT OF TISSUE EXPANDER AND FLEX HD (ACELLULAR HYDRATED DERMIS);  Surgeon: Lowery Estefana RAMAN, DO;  Location: [facility];  Service: Plastics;  Laterality: Right;   COLONOSCOPY WITH PROPOFOL  N/A 06/07/2021   Procedure: COLONOSCOPY WITH PROPOFOL ;  Surgeon: Cindie Carlin POUR, DO;  [facility];  Service: Endoscopy;  Laterality: N/A;  10:30 / ASA 2   COLPOSCOPY     LIPOSUCTION WITH LIPOFILLING Right 02/17/2021   Procedure: Lipofilling of right breast for symmetry;   Surgeon: Lowery Estefana RAMAN, DO;  Location: [facility];  Service: Plastics;  Laterality: Right;  1.5 hour   MASTECTOMY Right 04/28/2020   MASTECTOMY W/ SENTINEL NODE BIOPSY Right 04/28/2020   Procedure: RIGHT MASTECTOMY WITH SENTINEL LYMPH NODE BIOPSY;  Surgeon: [name], MD;  Location: [facility];  Service: General;  Laterality: Right;  PECTORAL BLOCK   MASTOPEXY Left 09/23/2020   Procedure: LEFT BREAST MASTOPEXY;  Surgeon: Lowery Estefana RAMAN, DO;  Location: [facility];  Service: Plastics;  Laterality: Left;   POLYPECTOMY  06/07/2021   Procedure: POLYPECTOMY;  Surgeon: Cindie Carlin POUR, DO;  [facility];  Service: Endoscopy;;   REDUCTION MAMMAPLASTY Left 04/28/2020   REMOVAL OF TISSUE EXPANDER AND PLACEMENT OF IMPLANT Right 09/23/2020   Procedure: REMOVAL OF TISSUE EXPANDER AND PLACEMENT OF IMPLANT;  Surgeon: Lowery Estefana RAMAN, DO;  Location: [facility];  Service: Plastics;  Laterality: Right;  90 min   UMBILICAL HERNIA REPAIR     --as a child    Current Outpatient Medications  Medication Sig Dispense Refill   Aspirin-Acetaminophen -Caffeine (EXCEDRIN MIGRAINE PO) Take 1 tablet by mouth daily as needed (headache).     Cetirizine HCl (ZYRTEC PO) Take by mouth.     Cholecalciferol (VITAMIN D3) 25 MCG (1000 UT) CAPS Take 1 capsule (1,000 Units total) by mouth daily. 60 capsule 3   Cyanocobalamin  (VITAMIN B 12 PO) Take by mouth.     Efinaconazole  10 % SOLN Apply 1 drop topically daily. 4 mL 11   fluticasone  (FLONASE ) 50 MCG/ACT nasal spray SHAKE LIQUID AND USE 2 SPRAYS IN EACH NOSTRIL DAILY 16 g 1   Multiple Vitamin (MULTIVITAMIN) tablet Take 1 tablet by mouth every 14 (fourteen) days.     Omega-3 Fatty Acids (FISH OIL) 1000 MG CAPS Take by mouth every 14 (fourteen) days.     tamoxifen  (NOLVADEX ) 20 MG tablet TAKE 1 TABLET(20 MG) BY MOUTH DAILY 90 tablet 0   tirzepatide  (ZEPBOUND ) 12.5 MG/0.5ML Pen Inject 12.5  mg into the skin once a week. 2 mL 0   valACYclovir  (VALTREX ) 500 MG tablet Take 4 tablets (2000 mg) by mouth twice a day for 24 hours as needed. 30 tablet 3   Vitamin D , Ergocalciferol , (DRISDOL ) 1.25 MG (50000 UNIT) CAPS capsule Take 1 capsule (50,000 Units total) by mouth every 7 (seven) days. 5 capsule 0   No current facility-administered medications for this visit.     ALLERGIES: Patient has no known allergies.  Family History  Problem Relation Age of Onset   Breast cancer Mother 95  Diabetes Mother    Hypertension Mother    Hyperlipidemia Mother    Thyroid  disease Mother    Transient ischemic attack Mother    Hyperlipidemia Father    High blood pressure Father    Aneurysm Maternal Aunt 26       Brain   Breast cancer Maternal Aunt 72       Breast cancer   HIV/AIDS Maternal Uncle    Kidney disease Maternal Uncle    Breast cancer Paternal Aunt 45       bil. Breast ca--deceased CHF   Cancer Paternal Uncle        NOS - needed BMT   Cancer Maternal Grandmother 32       adrenal gland cancer   Breast cancer Paternal Grandmother        dx in her mid to late 22s   Breast cancer Cousin 33       maternal first cousin died Breast ca age 56   Cancer Other        2 maternal great uncles with cancer NOS    Social History   Socioeconomic History   Marital status: Single    Spouse name: Not on file   Number of children: 0   Years of education: Not on file   Highest education level: Not on file  Occupational History   Not on file  Tobacco Use   Smoking status: Never   Smokeless tobacco: Never  Vaping Use   Vaping status: Never Used  Substance and Sexual Activity   Alcohol use: Yes    Comment: 1 drink per month   Drug use: Never   Sexual activity: Yes    Partners: Male    Birth control/protection: Condom    Comment: condoms everytime, not sexually active since 4/22  Other Topics Concern   Not on file  Social History Narrative   -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  No myometrial masses.  EMS 9.76 mm.   Possible 2 filling defects noted 6.4 x 4.0 mm and 13 x 12 mm with possible feeder vessel.  Free fluid noted in fundal region.  Left ovary 4.82 x 3.46 x 2.88 cm.  28 mm complex hemorrhagic cyst.  Right ovary 3.35 x 1.73 x 1.75 cm.  No adnexal masses.  Some free fluid in the cul de sac.   EMB: Consent for procedure. Time out done. Sterile prep with Hibiclens  Tenaculum to anterior cervical lip. Pipelle passed to    8      cm twice.   Tissue to pathology.  Minimal EBL. No complications.   Chaperone was present for exam:  Kari HERO, CMA  ASSESSMENT:  Abnormal uterine bleeding.  Thickened endometrium.  Tamoxifen  use. Complex left ovarian cyst.   PLAN:  Pelvic US  images and report reviewed.  If biopsy result is normal, will  proceed with saline ultrasound.  We did discuss hysteroscopy with dilation and curettage as an alternative.  Return for ultrasound recheck of left ovarian cyst in about 6 weeks.   20  total time was spent for this patient encounter, including preparation, face-to-face counseling with the patient, coordination of care, and documentation of the encounter in addition to doing the endometrial biopsy.

## 2023-10-08 NOTE — Telephone Encounter (Signed)
 Zepbound  approved through 06/03/2024

## 2023-10-09 ENCOUNTER — Encounter: Payer: Self-pay | Admitting: Obstetrics and Gynecology

## 2023-10-09 ENCOUNTER — Ambulatory Visit (INDEPENDENT_AMBULATORY_CARE_PROVIDER_SITE_OTHER): Admitting: Obstetrics and Gynecology

## 2023-10-09 ENCOUNTER — Other Ambulatory Visit (HOSPITAL_COMMUNITY)
Admission: RE | Admit: 2023-10-09 | Discharge: 2023-10-09 | Disposition: A | Discharge status: Home / Self Care | Source: Ambulatory Visit | Attending: Obstetrics and Gynecology | Admitting: Obstetrics and Gynecology

## 2023-10-09 ENCOUNTER — Ambulatory Visit (INDEPENDENT_AMBULATORY_CARE_PROVIDER_SITE_OTHER)

## 2023-10-09 VITALS — BP 126/86 | HR 70

## 2023-10-09 DIAGNOSIS — N939 Abnormal uterine and vaginal bleeding, unspecified: Secondary | ICD-10-CM

## 2023-10-09 DIAGNOSIS — Z79810 Long term (current) use of selective estrogen receptor modulators (SERMs): Secondary | ICD-10-CM | POA: Diagnosis not present

## 2023-10-09 DIAGNOSIS — Z01812 Encounter for preprocedural laboratory examination: Secondary | ICD-10-CM | POA: Diagnosis not present

## 2023-10-09 DIAGNOSIS — R9389 Abnormal findings on diagnostic imaging of other specified body structures: Secondary | ICD-10-CM | POA: Diagnosis present

## 2023-10-09 DIAGNOSIS — N926 Irregular menstruation, unspecified: Secondary | ICD-10-CM | POA: Diagnosis not present

## 2023-10-09 DIAGNOSIS — N83202 Unspecified ovarian cyst, left side: Secondary | ICD-10-CM

## 2023-10-09 LAB — PREGNANCY, URINE: Preg Test, Ur: NEGATIVE

## 2023-10-10 ENCOUNTER — Ambulatory Visit: Admitting: Obstetrics and Gynecology

## 2023-10-10 LAB — SURGICAL PATHOLOGY

## 2023-10-11 ENCOUNTER — Ambulatory Visit: Payer: Self-pay | Admitting: Obstetrics and Gynecology

## 2023-10-11 DIAGNOSIS — N939 Abnormal uterine and vaginal bleeding, unspecified: Secondary | ICD-10-CM

## 2023-10-11 DIAGNOSIS — R9389 Abnormal findings on diagnostic imaging of other specified body structures: Secondary | ICD-10-CM

## 2023-10-15 ENCOUNTER — Other Ambulatory Visit: Payer: Self-pay | Admitting: Obstetrics and Gynecology

## 2023-10-23 ENCOUNTER — Inpatient Hospital Stay: Attending: Nurse Practitioner

## 2023-10-23 DIAGNOSIS — E538 Deficiency of other specified B group vitamins: Secondary | ICD-10-CM | POA: Insufficient documentation

## 2023-10-23 MED ORDER — CYANOCOBALAMIN 1000 MCG/ML IJ SOLN
1000.0000 ug | Freq: Once | INTRAMUSCULAR | Status: AC
  Administered 2023-10-23 (×2): 1000 ug via INTRAMUSCULAR
  Filled 2023-10-23: qty 1

## 2023-10-31 ENCOUNTER — Ambulatory Visit: Admitting: Bariatrics

## 2023-10-31 ENCOUNTER — Encounter: Payer: Self-pay | Admitting: Bariatrics

## 2023-10-31 VITALS — BP 111/65 | HR 75 | Temp 98.2°F | Ht 68.0 in | Wt 239.0 lb

## 2023-10-31 DIAGNOSIS — R632 Polyphagia: Secondary | ICD-10-CM | POA: Diagnosis not present

## 2023-10-31 DIAGNOSIS — Z6836 Body mass index (BMI) 36.0-36.9, adult: Secondary | ICD-10-CM | POA: Diagnosis not present

## 2023-10-31 DIAGNOSIS — E669 Obesity, unspecified: Secondary | ICD-10-CM

## 2023-10-31 DIAGNOSIS — E782 Mixed hyperlipidemia: Secondary | ICD-10-CM

## 2023-10-31 DIAGNOSIS — E785 Hyperlipidemia, unspecified: Secondary | ICD-10-CM

## 2023-10-31 NOTE — Progress Notes (Addendum)
 WEIGHT SUMMARY AND BIOMETRICS  Weight Lost Since Last Visit: 0  Weight Gained Since Last Visit: 2lb   Vitals Temp: 98.2 F (36.8 C) BP: 111/65 Pulse Rate: 75 SpO2: 99 %   Anthropometric Measurements Height: 5' 8 (1.727 m) Weight: 239 lb (108.4 kg) BMI (Calculated): 36.35 Weight at Last Visit: 236lb Weight Lost Since Last Visit: 0 Weight Gained Since Last Visit: 2lb Starting Weight: 241lb Total Weight Loss (lbs): 3 lb (1.361 kg)   Body Composition  Body Fat %: 44.9 % Fat Mass (lbs): 107.4 lbs Muscle Mass (lbs): 125.2 lbs Total Body Water (lbs): 97.8 lbs Visceral Fat Rating : 12   Other Clinical Data Fasting: no Labs: no Today's Visit #: 25 Starting Date: 09/14/21    OBESITY Gail Erickson is here to discuss her progress with her obesity treatment plan along with follow-up of her obesity related diagnoses.    Nutrition Plan: the Category 2 plan - 10% adherence.  Current exercise: none  Interim History:  She is up 2 lbs since her last visit.  Eating all of the food on the plan., Protein intake is as prescribed, Not journaling consistently., Meeting protein goals., and Water intake is adequate. She has been off the Zepbound  12.5 mg for about 3 weeks due to difficulties with getting her medication approved. She has been on Wegovy  in the past but could not tolerate due to nausea and vomiting.  Wegovy  was discontinued but she has been able to tolerate the Zepbound .    Pharmacotherapy: Gail Erickson is on Zepbound  12.5 mg.  She was prescribed Zepbound  12.5 at her last visit we got an approval on 10/08/2023 that stated that her Zepbound  had been approved through her insurance through 06/03/2024.  The patient states that she received a letter that states that she has been approved for Mounjaro and not Zepbound .  She will upload the letter to us  so that we can figure out  what is going on and try to rectify the problem.  She does not have a diagnosis of diabetes. Adverse side effects: None Hunger is moderately controlled.  Cravings are moderately controlled.  Assessment/Plan:   Gail Erickson endorses excessive hunger.  Medication(s): Zepbound  Effects of medication:  moderately controlled. Cravings are moderately controlled.   Plan: Medication(s): Zepbound  12.5 mg SQ weekly Will increase water, protein and fiber to help assuage hunger.  Will minimize foods that have a high glucose index/load to minimize reactive hypoglycemia.  She will increase both her cardio and resistance training. She will continue her meal planning and cook at home. We will try to get her this issue in regard to her Zepbound  resolved, if not then we may have to consider Wegovy  as a backup.  I will not make the patient return to the office for her Wegovy  secondary to her schedule and that this insurance debacle is unusual and not related to that  fault on behalf of the patient.  Hyperlipidemia LDL is not at goal. Medication(s): Omega 3 fatty acids.  Cardiovascular risk factors: dyslipidemia, sedentary lifestyle, and smoking/ tobacco exposure  Lab Results  Component Value Date   CHOL 173 05/17/2022   HDL 46 05/17/2022   LDLCALC 114 (H) 05/17/2022   TRIG 65 05/17/2022   CHOLHDL 3.8 07/28/2021   Lab Results  Component Value Date   ALT 14 07/16/2023   AST 15 07/16/2023   ALKPHOS 39 07/16/2023   BILITOT 0.6 07/16/2023   The 10-year ASCVD risk score (Arnett DK, et al., 2019) is: 1.1%   Values used to calculate the score:     Age: 48 years     Clincally relevant sex: Female     Is Non-Hispanic African American: Yes     Diabetic: No     Tobacco smoker: No     Systolic Blood Pressure: 111 mmHg     Is BP treated: No     HDL Cholesterol: 42 mg/dL     Total Cholesterol: 173 mg/dL  Plan:  Continue statin.  Information sheet on healthy vs unhealthy fats.  Will avoid all  trans fats.  Will read labels Will minimize saturated fats except the following: low fat meats in moderation, diary, and limited dark chocolate.  Increase Omega 3 in foods, and consider an Omega 3 supplement.    Generalized Obesity: Current BMI BMI (Calculated): 36.35   Pharmacotherapy Plan Patient will upload the letter from the insurance company and we will try to resolve this issue in regard to her Zepbound  medication.  Gail Erickson is currently in the action stage of change. As such, her goal is to continue with weight loss efforts.  She has agreed to the Category 2 plan.  Exercise goals: For substantial health benefits, adults should do at least 150 minutes (2 hours and 30 minutes) a week of moderate-intensity, or 75 minutes (1 hour and 15 minutes) a week of vigorous-intensity aerobic physical activity, or an equivalent combination of moderate- and vigorous-intensity aerobic activity. Aerobic activity should be performed in episodes of at least 10 minutes, and preferably, it should be spread throughout the week. She is not getting enough exercise.   Behavioral modification strategies: increasing lean protein intake, decreasing simple carbohydrates , no meal skipping, decrease eating out, meal planning , increase water intake, better snacking choices, planning for success, increasing vegetables, keep healthy foods in the home, weigh protein portions, and mindful eating.  Gail Erickson has agreed to follow-up with our clinic in 4 weeks.   Objective:   VITALS: Per patient if applicable, see vitals. GENERAL: Alert and in no acute distress. CARDIOPULMONARY: No increased WOB. Speaking in clear sentences.  PSYCH: Pleasant and cooperative. Speech normal rate and rhythm. Affect is appropriate. Insight and judgement are appropriate. Attention is focused, linear, and appropriate.  NEURO: Oriented as arrived to appointment on time with no prompting.   Attestation Statements:   This was prepared with the  assistance of Engineer, civil (consulting).  Occasional wrong-word or sound-a-like substitutions may have occurred due to the inherent limitations of voice recognition   Clayborne Daring, DO

## 2023-10-31 NOTE — Addendum Note (Signed)
 Addended by: DELORES SHIELDS A on: 10/31/2023 04:24 PM   Modules accepted: Level of Service

## 2023-11-06 ENCOUNTER — Telehealth: Payer: Self-pay

## 2023-11-06 NOTE — Telephone Encounter (Signed)
 Zepbound  denied due to insurance stating the primary covered drug for the plan is Wegovy .

## 2023-11-06 NOTE — Telephone Encounter (Signed)
 Started PA for Zepbound  via covermymed

## 2023-11-07 ENCOUNTER — Other Ambulatory Visit: Payer: Self-pay | Admitting: Bariatrics

## 2023-11-07 MED ORDER — WEGOVY 0.5 MG/0.5ML ~~LOC~~ SOAJ
0.5000 mg | SUBCUTANEOUS | 0 refills | Status: DC
Start: 2023-11-07 — End: 2023-12-19

## 2023-11-16 NOTE — Progress Notes (Unsigned)
 GYNECOLOGY  VISIT   HPI: 48 y.o.   Single  African American female   G0P0000 with Patient's last menstrual period was 11/18/2023 (exact date).   here for: Sonohysterography.   Patient has had abnormal bleeding on Tamoxifen .      Pelvic US  10/09/23: Uterus 8.16 x 3.72 x 3.24 cm.  No myometrial masses.  EMS 9.76 mm.   Possible 2 filling defects noted 6.4 x 4.0 mm and 13 x 12 mm with possible feeder vessel.  Free fluid noted in fundal region.  Left ovary 4.82 x 3.46 x 2.88 cm.  28 mm complex hemorrhagic cyst.  Right ovary 3.35 x 1.73 x 1.75 cm.  No adnexal masses.  Some free fluid in the cul de sac.   EMB 10/09/23 showed early secretory endometrium, neg for hyperplasia and malignancy.  UPT negative.   GYNECOLOGIC HISTORY: Patient's last menstrual period was 11/18/2023 (exact date). Contraception:  Condoms  Menopausal hormone therapy:  n/a Last 2 paps:  09/04/23 neg HR HPV neg, 08/03/22 neg HR HPV neg  History of abnormal Pap or positive HPV:  yes 07/26/20 ASCUS  Mammogram:  02/12/23 Breast Density Cat C, BIRADS Cat 1 neg        OB History     Gravida  0   Para  0   Term  0   Preterm  0   AB  0   Living  0      SAB  0   IAB  0   Ectopic  0   Multiple  0   Live Births                 Patient Active Problem List   Diagnosis Date Noted   B12 deficiency 07/16/2023   Impacted cerumen of left ear 06/06/2023   Chronic sinusitis 06/06/2023   Neck pain 06/09/2022   Back pain 06/09/2022   BMI 36.0-36.9,adult 05/17/2022   Polyphagia 01/26/2022   Hirsutism 11/01/2021   Impaired fasting glucose 11/01/2021   Menopausal and female climacteric states 11/01/2021   Metabolic syndrome 11/01/2021   Pure hypercholesterolemia 11/01/2021   Mass of lower outer quadrant of left breast 10/04/2021   Fever blister 09/27/2021   Fungal infection of toenail 09/27/2021   Other fatigue 09/14/2021   SOB (shortness of breath) on exertion 09/14/2021   Prediabetes 09/14/2021    Health care maintenance 09/14/2021   Eating disorder 09/14/2021   Class 2 severe obesity with serious comorbidity and body mass index (BMI) of 36.0 to 36.9 in adult [facility]) 09/14/2021   Screening for colon cancer 03/29/2021   Generalized obesity 03/29/2021   Urine discoloration 03/29/2021   Postoperative breast asymmetry 02/25/2021   Acquired absence of breast 10/01/2020   Encounter to establish care 09/09/2020   Screening due 09/09/2020   Elevated cholesterol 09/09/2020   Vitamin D  deficiency 09/09/2020   Genetic testing 03/08/2020   Malignant neoplasm of overlapping sites of right breast (HCC) 02/25/2020   Family history of breast cancer     Past Medical History:  Diagnosis Date   Abnormal Pap smear of cervix 06/02/2014   LGSIL:neg HR HPV (1st abn. pap)   Back pain    Breast cancer (HCC) 2022   Cancer (HCC)    Cyst of right breast    --stable in 2015 U/S done in [place], [place]   Elevated DHEA 05/21/2015   level = 343   Family history of breast cancer    High cholesterol    HSV-1 (herpes  simplex virus 1) infection    oral HSV   Joint pain    Migraine without aura    PCOS (polycystic ovarian syndrome)    Prediabetes 2021   sees Dr.Balan   Swelling of lower extremity    Vitamin D  deficiency 05/21/2015   level = 10    Past Surgical History:  Procedure Laterality Date   BREAST EXCISIONAL BIOPSY Left 10/21/2021   BREAST LUMPECTOMY WITH RADIOACTIVE SEED LOCALIZATION Left 10/21/2021   Procedure: LEFT BREAST RADIOACTIVE SEED LOCALIZED LUMPECTOMY;  Surgeon: [name], MD;  Location: [facility];  Service: General;  Laterality: Left;   BREAST RECONSTRUCTION WITH PLACEMENT OF TISSUE EXPANDER AND FLEX HD (ACELLULAR HYDRATED DERMIS) Right 04/28/2020   Procedure: IMMEDIATE RIGHT BREAST RECONSTRUCTION WITH PLACEMENT OF TISSUE EXPANDER AND FLEX HD (ACELLULAR HYDRATED DERMIS);  Surgeon: Lowery Estefana RAMAN, DO;  Location: [facility];  Service: Plastics;   Laterality: Right;   COLONOSCOPY WITH PROPOFOL  N/A 06/07/2021   Procedure: COLONOSCOPY WITH PROPOFOL ;  Surgeon: Cindie Carlin POUR, DO;  [facility];  Service: Endoscopy;  Laterality: N/A;  10:30 / ASA 2   COLPOSCOPY     LIPOSUCTION WITH LIPOFILLING Right 02/17/2021   Procedure: Lipofilling of right breast for symmetry;  Surgeon: Lowery Estefana RAMAN, DO;  Location: [facility];  Service: Plastics;  Laterality: Right;  1.5 hour   MASTECTOMY Right 04/28/2020   MASTECTOMY W/ SENTINEL NODE BIOPSY Right 04/28/2020   Procedure: RIGHT MASTECTOMY WITH SENTINEL LYMPH NODE BIOPSY;  Surgeon: [name], MD;  Location: [facility];  Service: General;  Laterality: Right;  PECTORAL BLOCK   MASTOPEXY Left 09/23/2020   Procedure: LEFT BREAST MASTOPEXY;  Surgeon: Lowery Estefana RAMAN, DO;  Location: [facility];  Service: Plastics;  Laterality: Left;   POLYPECTOMY  06/07/2021   Procedure: POLYPECTOMY;  Surgeon: Cindie Carlin POUR, DO;  [facility];  Service: Endoscopy;;   REDUCTION MAMMAPLASTY Left 04/28/2020   REMOVAL OF TISSUE EXPANDER AND PLACEMENT OF IMPLANT Right 09/23/2020   Procedure: REMOVAL OF TISSUE EXPANDER AND PLACEMENT OF IMPLANT;  Surgeon: Lowery Estefana RAMAN, DO;  Location: [facility];  Service: Plastics;  Laterality: Right;  90 min   UMBILICAL HERNIA REPAIR     --as a child    Current Outpatient Medications  Medication Sig Dispense Refill   Aspirin-Acetaminophen -Caffeine (EXCEDRIN MIGRAINE PO) Take 1 tablet by mouth daily as needed (headache).     Cetirizine HCl (ZYRTEC PO) Take by mouth.     Cholecalciferol (VITAMIN D3) 25 MCG (1000 UT) CAPS Take 1 capsule (1,000 Units total) by mouth daily. 60 capsule 3   Cyanocobalamin  (VITAMIN B 12 PO) Take by mouth.     Efinaconazole  10 % SOLN Apply 1 drop topically daily. 4 mL 11   fluticasone  (FLONASE ) 50 MCG/ACT nasal spray SHAKE LIQUID AND USE 2 SPRAYS IN EACH  NOSTRIL DAILY 16 g 1   Multiple Vitamin (MULTIVITAMIN) tablet Take 1 tablet by mouth every 14 (fourteen) days.     Omega-3 Fatty Acids (FISH OIL) 1000 MG CAPS Take by mouth every 14 (fourteen) days.     semaglutide -weight management (WEGOVY ) 0.5 MG/0.5ML SOAJ SQ injection Inject 0.5 mg into the skin once a week. 2 mL 0   tamoxifen  (NOLVADEX ) 20 MG tablet TAKE 1 TABLET(20 MG) BY MOUTH DAILY 90 tablet 0   valACYclovir  (VALTREX ) 500 MG tablet Take 4 tablets (2000 mg) by mouth twice a day for 24  hours as needed. 30 tablet 3   Vitamin D , Ergocalciferol , (DRISDOL ) 1.25 MG (50000 UNIT) CAPS capsule Take 1 capsule (50,000 Units total) by mouth every 7 (seven) days. 5 capsule 0   No current facility-administered medications for this visit.     ALLERGIES: Patient has no known allergies.  Family History  Problem Relation Age of Onset   Breast cancer Mother 76   Diabetes Mother    Hypertension Mother    Hyperlipidemia Mother    Thyroid  disease Mother    Transient ischemic attack Mother    Hyperlipidemia Father    High blood pressure Father    Aneurysm Maternal Aunt 34       Brain   Breast cancer Maternal Aunt 72       Breast cancer   HIV/AIDS Maternal Uncle    Kidney disease Maternal Uncle    Breast cancer Paternal Aunt 45       bil. Breast ca--deceased CHF   Cancer Paternal Uncle        NOS - needed BMT   Cancer Maternal Grandmother 64       adrenal gland cancer   Breast cancer Paternal Grandmother        dx in her mid to late 59s   Breast cancer Cousin 42       maternal first cousin died Breast ca age 61   Cancer Other        2 maternal great uncles with cancer NOS    Social History   Socioeconomic History   Marital status: Single    Spouse name: Not on file   Number of children: 0   Years of education: Not on file   Highest education level: Not on file  Occupational History   Not on file  Tobacco Use   Smoking status: Never   Smokeless tobacco: Never  Vaping Use    Vaping status: Never Used  Substance and Sexual Activity   Alcohol use: Yes    Comment: 1 drink per month   Drug use: Never   Sexual activity: Yes    Partners: Male    Birth control/protection: Condom    Comment: condoms everytime, not sexually active since 4/22  Other Topics Concern   Not on file  Social History Narrative   -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   PHYSICAL EXAMINATION:   BP 124/82 (BP Location: Left Arm, Patient Position: Sitting)   Pulse 73   LMP 11/18/2023 (Exact Date)   SpO2 97%     General appearance: alert, cooperative and appears stated age Head: Normocephalic, without obvious abnormality, atraumatic Neck: no adenopathy, supple, symmetrical, trachea midline and thyroid  normal to inspection and palpation Lungs: clear to auscultation bilaterally Breasts: normal appearance, no masses or tenderness, No nipple retraction or dimpling, No nipple discharge or bleeding, No axillary or supraclavicular adenopathy Heart: regular rate and rhythm Abdomen: soft, non-tender, no masses,  no organomegaly Extremities: extremities normal, atraumatic, no cyanosis or edema Skin: Skin color, texture, turgor normal. No rashes or lesions Lymph nodes: Cervical, supraclavicular, and axillary nodes normal. No abnormal inguinal nodes palpated  Neurologic: Grossly normal  Pelvic: External genitalia:  no lesions              Urethra:  normal appearing urethra with no masses, tenderness or lesions              Bartholins and Skenes: normal                 Vagina: normal appearing vagina with normal color and discharge, no  lesions              Cervix: no lesions                Bimanual Exam:  Uterus:  normal size, contour, position, consistency, mobility, non-tender              Adnexa: no mass, fullness, tenderness              Rectal exam: {yes no:314532}.  Confirms.              Anus:  normal sphincter tone, no lesions  Chaperone was present for exam:  {BSCHAPERONE:31226::Emily F, CMA}  ASSESSMENT:  Finishing period today.   PLAN:    {LABS (Optional):23779}  ***  total time was spent for this patient encounter, including preparation, face-to-face counseling with the patient, coordination of care, and documentation of the encounter.

## 2023-11-20 ENCOUNTER — Inpatient Hospital Stay: Attending: Nurse Practitioner

## 2023-11-20 DIAGNOSIS — E538 Deficiency of other specified B group vitamins: Secondary | ICD-10-CM | POA: Insufficient documentation

## 2023-11-20 MED ORDER — CYANOCOBALAMIN 1000 MCG/ML IJ SOLN
1000.0000 ug | Freq: Once | INTRAMUSCULAR | Status: AC
  Administered 2023-11-20: 1000 ug via INTRAMUSCULAR
  Filled 2023-11-20: qty 1

## 2023-11-20 NOTE — Patient Instructions (Signed)
LI

## 2023-11-22 ENCOUNTER — Other Ambulatory Visit

## 2023-11-22 ENCOUNTER — Ambulatory Visit (INDEPENDENT_AMBULATORY_CARE_PROVIDER_SITE_OTHER): Admitting: Obstetrics and Gynecology

## 2023-11-22 ENCOUNTER — Encounter: Payer: Self-pay | Admitting: Obstetrics and Gynecology

## 2023-11-22 ENCOUNTER — Ambulatory Visit (INDEPENDENT_AMBULATORY_CARE_PROVIDER_SITE_OTHER)

## 2023-11-22 ENCOUNTER — Other Ambulatory Visit: Admitting: Obstetrics and Gynecology

## 2023-11-22 VITALS — BP 124/82 | HR 73

## 2023-11-22 DIAGNOSIS — R9389 Abnormal findings on diagnostic imaging of other specified body structures: Secondary | ICD-10-CM

## 2023-11-22 DIAGNOSIS — Z79810 Long term (current) use of selective estrogen receptor modulators (SERMs): Secondary | ICD-10-CM | POA: Diagnosis not present

## 2023-11-22 DIAGNOSIS — N939 Abnormal uterine and vaginal bleeding, unspecified: Secondary | ICD-10-CM | POA: Diagnosis not present

## 2023-11-22 DIAGNOSIS — Z01812 Encounter for preprocedural laboratory examination: Secondary | ICD-10-CM

## 2023-11-22 DIAGNOSIS — N83201 Unspecified ovarian cyst, right side: Secondary | ICD-10-CM | POA: Diagnosis not present

## 2023-11-22 DIAGNOSIS — N83202 Unspecified ovarian cyst, left side: Secondary | ICD-10-CM | POA: Diagnosis not present

## 2023-11-22 DIAGNOSIS — Z8742 Personal history of other diseases of the female genital tract: Secondary | ICD-10-CM | POA: Diagnosis not present

## 2023-11-22 LAB — PREGNANCY, URINE: Preg Test, Ur: NEGATIVE

## 2023-12-07 ENCOUNTER — Other Ambulatory Visit: Payer: Self-pay | Admitting: Family Medicine

## 2023-12-07 DIAGNOSIS — J329 Chronic sinusitis, unspecified: Secondary | ICD-10-CM

## 2023-12-10 ENCOUNTER — Other Ambulatory Visit: Payer: Self-pay | Admitting: Hematology

## 2023-12-10 DIAGNOSIS — C50811 Malignant neoplasm of overlapping sites of right female breast: Secondary | ICD-10-CM

## 2023-12-11 ENCOUNTER — Ambulatory Visit: Admitting: Bariatrics

## 2023-12-18 ENCOUNTER — Inpatient Hospital Stay: Attending: Nurse Practitioner

## 2023-12-18 DIAGNOSIS — Z17411 Hormone receptor positive with human epidermal growth factor receptor 2 negative status: Secondary | ICD-10-CM | POA: Diagnosis not present

## 2023-12-18 DIAGNOSIS — E538 Deficiency of other specified B group vitamins: Secondary | ICD-10-CM | POA: Diagnosis present

## 2023-12-18 DIAGNOSIS — C50811 Malignant neoplasm of overlapping sites of right female breast: Secondary | ICD-10-CM | POA: Diagnosis not present

## 2023-12-18 MED ORDER — CYANOCOBALAMIN 1000 MCG/ML IJ SOLN
1000.0000 ug | Freq: Once | INTRAMUSCULAR | Status: AC
  Administered 2023-12-18: 1000 ug via INTRAMUSCULAR
  Filled 2023-12-18: qty 1

## 2023-12-19 ENCOUNTER — Ambulatory Visit: Admitting: Bariatrics

## 2023-12-19 ENCOUNTER — Encounter: Payer: Self-pay | Admitting: Bariatrics

## 2023-12-19 VITALS — BP 115/74 | HR 71 | Temp 98.1°F | Ht 68.0 in | Wt 246.0 lb

## 2023-12-19 DIAGNOSIS — E669 Obesity, unspecified: Secondary | ICD-10-CM | POA: Diagnosis not present

## 2023-12-19 DIAGNOSIS — R632 Polyphagia: Secondary | ICD-10-CM

## 2023-12-19 DIAGNOSIS — R7303 Prediabetes: Secondary | ICD-10-CM | POA: Diagnosis not present

## 2023-12-19 DIAGNOSIS — Z6837 Body mass index (BMI) 37.0-37.9, adult: Secondary | ICD-10-CM

## 2023-12-19 MED ORDER — WEGOVY 1 MG/0.5ML ~~LOC~~ SOAJ: 1.0000 mg | SUBCUTANEOUS | 0 refills | Status: DC

## 2023-12-19 NOTE — Progress Notes (Signed)
 WEIGHT SUMMARY AND BIOMETRICS  Weight Lost Since Last Visit: 0  Weight Gained Since Last Visit: 7lb   Vitals Temp: 98.1 F (36.7 C) BP: 115/74 Pulse Rate: 71 SpO2: 100 %   Anthropometric Measurements Height: 5' 8 (1.727 m) Weight: 246 lb (111.6 kg) BMI (Calculated): 37.41 Weight at Last Visit: 239lb Weight Lost Since Last Visit: 0 Weight Gained Since Last Visit: 7lb Starting Weight: 241lb Total Weight Loss (lbs): 0 lb (0 kg)   Body Composition  Body Fat %: 44.7 % Fat Mass (lbs): 110.4 lbs Muscle Mass (lbs): 129.6 lbs Total Body Water (lbs): 99.2 lbs Visceral Fat Rating : 12   Other Clinical Data Fasting: no Labs: no Today's Visit #: 26 Starting Date: 09/14/21    OBESITY Marvel is here to discuss her progress with her obesity treatment plan along with follow-up of her obesity related diagnoses.    Nutrition Plan: the Category 2 plan - 0% adherence.  Current exercise: walking  Interim History:  She is up 7 lbs since her last visit.  Protein intake is as prescribed, Is skipping meals, and Water intake is adequate.   Pharmacotherapy: Quintella is on Wegovy  0.50 mg SQ weekly Adverse side effects: None Hunger is moderately controlled.  Cravings are moderately controlled.  Assessment/Plan:   Ginni Eichler endorses excessive hunger.  Medication(s): Wegovy  Effects of medication:  moderately controlled. Cravings are moderately controlled.   Plan: Medication(s): Wegovy  1.0 mg SQ weekly (Increase from 0.5 mg to 1.0 mg) Will increase water, protein and fiber to help assuage hunger.  Will minimize foods that have a high glucose index/load to minimize reactive hypoglycemia.  Will have a protein shake every day.  Will eat her protein first.   Prediabetes Last A1c was 5.5  Medication(s): Wegovy  1.0 mg SQ weekly Lab Results  Component Value Date    HGBA1C 5.5 05/17/2022   HGBA1C 5.5 09/14/2021   HGBA1C 5.4 06/13/2017   HGBA1C 5.1 07/01/2014   Lab Results  Component Value Date   INSULIN  17.6 05/17/2022   INSULIN  17.5 09/14/2021    Plan: Will minimize all refined carbohydrates both sweets and starches.  Will work on the plan and exercise.  Consider both aerobic and resistance training.  Will keep protein, water, and fiber intake high.  IContinue and increase dose Wegovy  1.0 mg SQ weekly  Handouts: High protein vegetables, Protein pairing, and high protein sources.    Generalized Obesity: Current BMI BMI (Calculated): 37.41   Pharmacotherapy Plan Continue and increase dose  Wegovy  1.0 mg SQ weekly  Jahniyah is currently in the action stage of change. As such, her goal is to continue with weight loss efforts.  She has agreed to the Category 2 plan.  Exercise goals: All adults should avoid inactivity. Some physical activity is better than none, and adults who participate in any amount of physical activity gain some health  benefits.  Behavioral modification strategies: increasing lean protein intake, decreasing simple carbohydrates , no meal skipping, meal planning , decrease liquid calories, increase water intake, better snacking choices, planning for success, increasing vegetables, increasing lower sugar fruits, increasing fiber rich foods, and avoiding temptations.  Nahlia has agreed to follow-up with our clinic in 4 weeks.       Objective:   VITALS: Per patient if applicable, see vitals. GENERAL: Alert and in no acute distress. CARDIOPULMONARY: No increased WOB. Speaking in clear sentences.  PSYCH: Pleasant and cooperative. Speech normal rate and rhythm. Affect is appropriate. Insight and judgement are appropriate. Attention is focused, linear, and appropriate.  NEURO: Oriented as arrived to appointment on time with no prompting.   Attestation Statements:   This was prepared with the assistance of Restaurant manager, fast food.  Occasional wrong-word or sound-a-like substitutions may have occurred due to the inherent limitations of voice recognition   Clayborne Daring, DO

## 2024-01-11 ENCOUNTER — Inpatient Hospital Stay

## 2024-01-11 DIAGNOSIS — E538 Deficiency of other specified B group vitamins: Secondary | ICD-10-CM

## 2024-01-11 DIAGNOSIS — C50811 Malignant neoplasm of overlapping sites of right female breast: Secondary | ICD-10-CM

## 2024-01-11 DIAGNOSIS — D649 Anemia, unspecified: Secondary | ICD-10-CM

## 2024-01-11 LAB — CMP (CANCER CENTER ONLY)
ALT: 12 U/L (ref 0–44)
AST: 17 U/L (ref 15–41)
Albumin: 4 g/dL (ref 3.5–5.0)
Alkaline Phosphatase: 44 U/L (ref 38–126)
Anion gap: 5 (ref 5–15)
BUN: 13 mg/dL (ref 6–20)
CO2: 26 mmol/L (ref 22–32)
Calcium: 9.3 mg/dL (ref 8.9–10.3)
Chloride: 109 mmol/L (ref 98–111)
Creatinine: 0.96 mg/dL (ref 0.44–1.00)
GFR, Estimated: >60 mL/min (ref >60)
Glucose, Bld: 90 mg/dL (ref 70–99)
Potassium: 4 mmol/L (ref 3.5–5.1)
Sodium: 140 mmol/L (ref 135–145)
Total Bilirubin: 0.6 mg/dL (ref 0.0–1.2)
Total Protein: 7.1 g/dL (ref 6.5–8.1)

## 2024-01-11 LAB — CBC WITH DIFFERENTIAL (CANCER CENTER ONLY)
Abs Immature Granulocytes: 0.01 K/uL (ref 0.00–0.07)
Basophils Absolute: 0 K/uL (ref 0.0–0.1)
Basophils Relative: 1 %
Eosinophils Absolute: 0.1 K/uL (ref 0.0–0.5)
Eosinophils Relative: 3 %
HCT: 34.3 % — ABNORMAL LOW (ref 36.0–46.0)
Hemoglobin: 11.8 g/dL — ABNORMAL LOW (ref 12.0–15.0)
Immature Granulocytes: 0 %
Lymphocytes Relative: 41 %
Lymphs Abs: 2 K/uL (ref 0.7–4.0)
MCH: 27.6 pg (ref 26.0–34.0)
MCHC: 34.4 g/dL (ref 30.0–36.0)
MCV: 80.1 fL (ref 80.0–100.0)
Monocytes Absolute: 0.5 K/uL (ref 0.1–1.0)
Monocytes Relative: 10 %
Neutro Abs: 2.2 K/uL (ref 1.7–7.7)
Neutrophils Relative %: 45 %
Platelet Count: 322 K/uL (ref 150–400)
RBC: 4.28 MIL/uL (ref 3.87–5.11)
RDW: 14.5 % (ref 11.5–15.5)
WBC Count: 4.8 K/uL (ref 4.0–10.5)
nRBC: 0 % (ref 0.0–0.2)

## 2024-01-11 LAB — VITAMIN B12: Vitamin B-12: 502 pg/mL (ref 180–914)

## 2024-01-11 LAB — FERRITIN: Ferritin: 50 ng/mL (ref 11–307)

## 2024-01-15 ENCOUNTER — Inpatient Hospital Stay

## 2024-01-15 ENCOUNTER — Inpatient Hospital Stay: Attending: Hematology | Admitting: Hematology

## 2024-01-15 VITALS — BP 112/70 | HR 105 | Temp 98.0°F | Resp 16 | Ht 68.0 in | Wt 253.1 lb

## 2024-01-15 DIAGNOSIS — E538 Deficiency of other specified B group vitamins: Secondary | ICD-10-CM | POA: Diagnosis not present

## 2024-01-15 DIAGNOSIS — C50811 Malignant neoplasm of overlapping sites of right female breast: Secondary | ICD-10-CM | POA: Diagnosis not present

## 2024-01-15 DIAGNOSIS — Z23 Encounter for immunization: Secondary | ICD-10-CM

## 2024-01-15 MED ORDER — CYANOCOBALAMIN 1000 MCG/ML IJ SOLN
1000.0000 ug | Freq: Once | INTRAMUSCULAR | Status: AC
  Administered 2024-01-15: 1000 ug via INTRAMUSCULAR
  Filled 2024-01-15: qty 1

## 2024-01-15 MED ORDER — INFLUENZA VIRUS VACC SPLIT PF (FLUZONE) 0.5 ML IM SUSY
0.5000 mL | PREFILLED_SYRINGE | Freq: Once | INTRAMUSCULAR | Status: AC
  Administered 2024-01-15: 0.5 mL via INTRAMUSCULAR
  Filled 2024-01-15: qty 0.5

## 2024-01-15 NOTE — Assessment & Plan Note (Signed)
 invasive ductal carcinoma and DCIS, [id number], stage IA, ER+/PR+/HER2-, Grade II -Diagnosed in 02/2020, s/p right mastectomy with Dr Alethea Andes and Dr Orin Birk on 04/28/20 -Given the T1a tumor, she did not need Oncotype, adjuvant chemotherapy or postmastectomy radiation.  -She began tamoxifen  20mg  once daily on 07/10/20.  -08/2021 screening breast MRI showed new left breast mass at 5:00 measuring up to 9 mm, path showed benign breast tissue with focally increased stromal fibrosis and fat of the left breast. Path was found to be discordant. -s/p left lumpectomy 10/21/21 by Dr. Alethea Andes, path showed fibromatosis, negative for malignancy. She recovered

## 2024-01-15 NOTE — Patient Instructions (Signed)
 Vitamin B12 Injection What is this medication? Vitamin B12 (VAHY tuh min B12) prevents and treats low vitamin B12 levels in your body. It is used in people who do not get enough vitamin B12 from their diet or when their digestive tract does not absorb enough. Vitamin B12 plays an important role in maintaining the health of your nervous system and red blood cells. This medicine may be used for other purposes; ask your health care provider or pharmacist if you have questions. COMMON BRAND NAME(S): B-12 Compliance Kit, B-12 Injection Kit, Cyomin, Dodex , LA-12, Nutri-Twelve, Physicians EZ Use B-12, Primabalt, Vitamin Deficiency Injectable System - B12 What should I tell my care team before I take this medication? They need to know if you have any of these conditions: Kidney disease Leber's disease Megaloblastic anemia An unusual or allergic reaction to cyanocobalamin , cobalt, other medications, foods, dyes, or preservatives Pregnant or trying to get pregnant Breast-feeding How should I use this medication? This medication is injected into a muscle or deeply under the skin. It is usually given in a clinic or care team's office. However, your care team may teach you how to inject yourself. Follow all instructions. Talk to your care team about the use of this medication in children. Special care may be needed. Overdosage: If you think you have taken too much of this medicine contact a poison control center or emergency room at once. NOTE: This medicine is only for you. Do not share this medicine with others. What if I miss a dose? If you are given your dose at a clinic or care team's office, call to reschedule your appointment. If you give your own injections, and you miss a dose, take it as soon as you can. If it is almost time for your next dose, take only that dose. Do not take double or extra doses. What may interact with this medication? Alcohol Colchicine This list may not describe all possible  interactions. Give your health care provider a list of all the medicines, herbs, non-prescription drugs, or dietary supplements you use. Also tell them if you smoke, drink alcohol, or use illegal drugs. Some items may interact with your medicine. What should I watch for while using this medication? Visit your care team regularly. You may need blood work done while you are taking this medication. You may need to follow a special diet. Talk to your care team. Limit your alcohol intake and avoid smoking to get the best benefit. What side effects may I notice from receiving this medication? Side effects that you should report to your care team as soon as possible: Allergic reactions--skin rash, itching, hives, swelling of the face, lips, tongue, or throat Swelling of the ankles, hands, or feet Trouble breathing Side effects that usually do not require medical attention (report to your care team if they continue or are bothersome): Diarrhea This list may not describe all possible side effects. Call your doctor for medical advice about side effects. You may report side effects to FDA at 1-800-FDA-1088. Where should I keep my medication? Keep out of the reach of children. Store at room temperature between 15 and 30 degrees C (59 and 85 degrees F). Protect from light. Throw away any unused medication after the expiration date. NOTE: This sheet is a summary. It may not cover all possible information. If you have questions about this medicine, talk to your doctor, pharmacist, or health care provider.  2024 Elsevier/Gold Standard (2020-11-09 00:00:00)

## 2024-01-15 NOTE — Progress Notes (Signed)
 [facility]   [phone] [phone]   Clinic Follow up Note   Patient Care Team: Bacchus, Meade PEDLAR, FNP as PCP - General (Family Medicine) [name], MD as Consulting Physician (Obstetrics and Gynecology) [name], MD as Consulting Physician (General Surgery) [name], MD as Consulting Physician (Hematology) [name], RN as Oncology Nurse Navigator Burton, Lacie K, NP as Nurse Practitioner (Nurse Practitioner) Peggi Caffey as Consulting Physician (Endocrinology) Diagnostic Radiology & Imaging, [facility] as Consulting Physician (Radiology)  Date of Service:  01/15/2024  CHIEF COMPLAINT: f/u of right breast cancer  CURRENT THERAPY:  Adjuvant tamoxifen  20 mg daily  Oncology History   Malignant neoplasm of overlapping sites of right breast [facility]) invasive ductal carcinoma and DCIS, pT1aN0M0, stage IA, ER+/PR+/HER2-, Grade II -Diagnosed in 02/2020, s/p right mastectomy with Dr Curvin and Dr Lowery on 04/28/20 -Given the T1a tumor, she did not need Oncotype, adjuvant chemotherapy or postmastectomy radiation.  -She began tamoxifen  20mg  once daily on 07/10/20.  -08/2021 screening breast MRI showed new left breast mass at 5:00 measuring up to 9 mm, path showed benign breast tissue with focally increased stromal fibrosis and fat of the left breast. Path was found to be discordant. -s/p left lumpectomy 10/21/21 by Dr. Curvin, path showed fibromatosis, negative for malignancy. She recovered  Assessment & Plan Right breast cancer on tamoxifen , under active surveillance Right breast cancer under active surveillance with tamoxifen  since April 2022. Joint pain and vaginal dryness are intermittent side effects of tamoxifen . Recent MRI in June was normal. Last mammogram was in December. Dense breast tissue noted, category C. Contrast-enhanced mammogram discussed as an alternative to MRI for better imaging, but she prefers to continue with  current MRI schedule for peace of mind. Tamoxifen  reduces the risk of recurrence, which is small due to the small size of the initial cancer. - Continue tamoxifen  until 2027 - Continue MRI every six months for surveillance - Due to her dense breast tissue, I recommend contrast-enhanced mammogram  Vitamin B12 deficiency  Vitamin B12 deficiency managed with monthly injections since May. B12 levels have improved with treatment, but she still experiences numbness and tingling in extremities. Discussed the option of switching to oral B12 supplementation. - Administered B12 injection today - Consider switching to oral B12 supplementation, 1000 mcg daily - Will recheck B12 levels in six months  Mild anemia Hemoglobin at 11.8 g/dL. Iron levels are normal but slightly lower than six months ago. B12 levels are normal. - Continue monitoring blood counts - Will recheck B12 and iron levels in six months  Suspected sickle cell trait, testing planned Suspected sickle cell trait identified during blood donation. No family history of sickle cell disease reported. Discussed that sickle cell trait is common in African American individuals and does not significantly impact health, but can be passed to offspring with a 50% chance. - Will test for sickle cell trait in six months  Plan - She is clinically doing well, tolerating tamoxifen  well, will continue for total of 5 years  -Will proceed to B12 injection today, then changed to oral B12 1000 mcg daily -Will repeat B12 level, and check hemoglobin electrophoresis on next lab - Lab and follow-up in 6 months.   SUMMARY OF ONCOLOGIC HISTORY: Oncology History Overview Note  Cancer Staging Malignant neoplasm of overlapping sites of right breast [facility]) Staging form: Breast, AJCC 8th Edition - Clinical stage from 02/25/2020: Stage IIB (cT3, cN0, cM0, G3, ER+, PR+, HER2-) -  Signed by [name], MD on 02/25/2020 - Pathologic stage from 04/28/2020: Stage IA (pT1a,  pN0, cM0, G2, ER+, PR+, HER2-) - Signed by [name], MD on 05/06/2020 Histologic grading system: 3 grade system    Malignant neoplasm of overlapping sites of right breast (HCC)  01/30/2020 Mammogram   Mammogram 01/30/20  IMPRESSION: 1. 7.3 x 5.7 x 3.0 cm area of indeterminate calcifications in medial right breast, involving the lower inner and upper inner quadrants, these extend from the retroareolar region posteriorly, in a segmental distribution, suspicious for DCIS. 2. Two probably benign left breast masses and 2 probably benign left axillary lymph nodes with borderline cortical thickening. 3. Benign right breast cyst.   02/13/2020 Initial Biopsy   Diagnosis 02/13/20 1. Breast, right, needle core biopsy, posterior extent - DUCTAL CARCINOMA IN SITU WITH CALCIFICATIONS AND NECROSIS - SEE COMMENT 2. Breast, right, needle core biopsy, mid-anterior extent - INVASIVE DUCTAL CARCINOMA - DUCTAL CARCINOMA IN SITU - SEE COMMENT Microscopic Comment 1. Based on the biopsy, the ductal carcinoma in situ has a solid pattern, high nuclear grade and measures 0.5 cm in greatest linear extent. Prognostic markers (ER/PR) are pending and will be reported in an addendum. Dr. Jodene reviewed the case and agrees with the above diagnosis. These results were called to [facility] on February 16, 2020. 2. Based on the biopsy, the carcinoma appears Nottingham grade 3 of 3 and measures 0.15 cm in greatest linear extent. Prognostic markers (ER/PR/ki-67/HER2) are pending and will be reported in an addendum.   02/13/2020 Receptors her2    1. PROGNOSTIC INDICATORS Results: IMMUNOHISTOCHEMICAL AND MORPHOMETRIC ANALYSIS PERFORMED MANUALLY Estrogen Receptor: >95%, POSITIVE, MODERATE-STRONG STAINING INTENSITY Progesterone Receptor: 5%, POSITIVE, MODERATE-STRONG STAINING INTENSITY  2. PROGNOSTIC INDICATORS Results: IMMUNOHISTOCHEMICAL AND MORPHOMETRIC ANALYSIS PERFORMED MANUALLY The tumor  cells are NEGATIVE for Her2 (0). Estrogen Receptor: >95%, POSITIVE, STRONG STAINING INTENSITY Progesterone Receptor: 60%, POSITIVE, STRONG STAINING INTENSITY Proliferation Marker Ki67: 1%   02/21/2020 Breast MRI   Breast MRI 02/21/20  IMPRESSION: 1. Involving the sites of biopsy proven DCIS in the medial right breast there is a large area of ill-defined non mass enhancement middle to anterior depth, somewhat difficult to measure due to irregular margins and background enhancement but measures at least 6.3 x 3.3 x 2.0 cm. 2. No MRI evidence of malignancy in the left breast. 3. No axillary adenopathy.   02/25/2020 Initial Diagnosis   Malignant neoplasm of overlapping sites of right breast (HCC)   02/25/2020 Cancer Staging   Staging form: Breast, AJCC 8th Edition - Clinical stage from 02/25/2020: Stage IIB (cT3, cN0, cM0, G3, ER+, PR+, HER2-) - Signed by [name], MD on 02/25/2020   02/27/2020 Pathology Results   Diagnosis Lymph node, needle/core biopsy, axillary - NO CARCINOMA IDENTIFIED IN NODAL TISSUE Microscopic Comment These results were called to [facility] on March 09, 2020.   04/28/2020 Surgery   RIGHT MASTECTOMY WITH SENTINEL LYMPH NODE BIOPSY by DR Curvin  IMMEDIATE RIGHT BREAST RECONSTRUCTION WITH PLACEMENT OF TISSUE EXPANDER AND FLEX HD (ACELLULAR HYDRATED DERMIS) by Dr Lowery   04/28/2020 Pathology Results   FINAL MICROSCOPIC DIAGNOSIS:   A. BREAST, RIGHT, MASTECTOMY:  -  Invasive ductal carcinoma, Nottingham grade 2 of 3, 0.5 cm  -  Ductal carcinoma in-situ, high grade  -  Calcifications associated with carcinoma  -  Margins uninvolved by carcinoma (> 2 cm; deep margin)  -  Previous biopsy site changes present  -  See oncology table  below   B. LYMPH NODE, RIGHT AXILLARY, SENTINEL, EXCISION:  -  No carcinoma identified in one lymph node (0/1)    04/28/2020 Cancer Staging   Staging form: Breast, AJCC 8th Edition - Pathologic stage  from 04/28/2020: Stage IA (pT1a, pN0, cM0, G2, ER+, PR+, HER2-) - Signed by [name], MD on 05/06/2020 Histologic grading system: 3 grade system   07/10/2020 -  Anti-estrogen oral therapy   Tamoxifen  20mg  once daily starting 07/10/20   11/04/2020 Survivorship   SCP delivered by Lacie Burton, NP      Discussed the use of AI scribe software for clinical note transcription with the patient, who gave verbal consent to proceed.  History of Present Illness [facility] is a 48 year old female with breast cancer who presents for follow-up.  She has been on tamoxifen  since April 2022, experiencing intermittent joint pain and vaginal dryness. Her last MRI in June 2025 and mammogram in December 2024 were normal, with dense breast tissue categorized as C.  She recently discovered she has a sickle cell trait after donating blood two months ago. Her hemoglobin level is 11.8, with normal iron and B12 levels. Despite monthly B12 injections since May 2025, she continues to experience numbness and tingling in her extremities.  She underwent a lumpectomy and lymph node removal, with only one lymph node removed for testing. She experiences soreness and tenderness in her arm, attributed to physical therapy.     All other systems were reviewed with the patient and are negative.  MEDICAL HISTORY:  Past Medical History:  Diagnosis Date   Abnormal Pap smear of cervix 06/02/2014   LGSIL:neg HR HPV (1st abn. pap)   Back pain    Breast cancer (HCC) 2022   Cancer (HCC)    Cyst of right breast    --stable in 2015 U/S done in [place], [place]   Elevated DHEA 05/21/2015   level = 343   Family history of breast cancer    High cholesterol    HSV-1 (herpes simplex virus 1) infection    oral HSV   Joint pain    Migraine without aura    PCOS (polycystic ovarian syndrome)    Prediabetes 2021   sees Dr.Balan   Swelling of lower extremity    Vitamin D  deficiency 05/21/2015   level = 10    SURGICAL  HISTORY: Past Surgical History:  Procedure Laterality Date   BREAST EXCISIONAL BIOPSY Left 10/21/2021   BREAST LUMPECTOMY WITH RADIOACTIVE SEED LOCALIZATION Left 10/21/2021   Procedure: LEFT BREAST RADIOACTIVE SEED LOCALIZED LUMPECTOMY;  Surgeon: [name], MD;  Location: [facility];  Service: General;  Laterality: Left;   BREAST RECONSTRUCTION WITH PLACEMENT OF TISSUE EXPANDER AND FLEX HD (ACELLULAR HYDRATED DERMIS) Right 04/28/2020   Procedure: IMMEDIATE RIGHT BREAST RECONSTRUCTION WITH PLACEMENT OF TISSUE EXPANDER AND FLEX HD (ACELLULAR HYDRATED DERMIS);  Surgeon: Lowery Estefana RAMAN, DO;  Location: [facility];  Service: Plastics;  Laterality: Right;   COLONOSCOPY WITH PROPOFOL  N/A 06/07/2021   Procedure: COLONOSCOPY WITH PROPOFOL ;  Surgeon: Cindie Carlin POUR, DO;  [facility];  Service: Endoscopy;  Laterality: N/A;  10:30 / ASA 2   COLPOSCOPY     LIPOSUCTION WITH LIPOFILLING Right 02/17/2021   Procedure: Lipofilling of right breast for symmetry;  Surgeon: Lowery Estefana RAMAN, DO;  Location: [facility];  Service: Plastics;  Laterality: Right;  1.5 hour   MASTECTOMY Right 04/28/2020   MASTECTOMY W/ SENTINEL NODE BIOPSY  Right 04/28/2020   Procedure: RIGHT MASTECTOMY WITH SENTINEL LYMPH NODE BIOPSY;  Surgeon: [name], MD;  Location: [facility];  Service: General;  Laterality: Right;  PECTORAL BLOCK   MASTOPEXY Left 09/23/2020   Procedure: LEFT BREAST MASTOPEXY;  Surgeon: Lowery Estefana RAMAN, DO;  Location: [facility];  Service: Plastics;  Laterality: Left;   POLYPECTOMY  06/07/2021   Procedure: POLYPECTOMY;  Surgeon: Cindie Carlin POUR, DO;  [facility];  Service: Endoscopy;;   REDUCTION MAMMAPLASTY Left 04/28/2020   REMOVAL OF TISSUE EXPANDER AND PLACEMENT OF IMPLANT Right 09/23/2020   Procedure: REMOVAL OF TISSUE EXPANDER AND PLACEMENT OF IMPLANT;  Surgeon: Lowery Estefana RAMAN,  DO;  [facility];  Service: Plastics;  Laterality: Right;  90 min   UMBILICAL HERNIA REPAIR     --as a child    I have reviewed the social history and family history with the patient and they are unchanged from previous note.  ALLERGIES:  has no known allergies.  MEDICATIONS:  Current Outpatient Medications  Medication Sig Dispense Refill   Aspirin-Acetaminophen -Caffeine (EXCEDRIN MIGRAINE PO) Take 1 tablet by mouth daily as needed (headache).     Cetirizine HCl (ZYRTEC PO) Take by mouth.     Cholecalciferol (VITAMIN D3) 25 MCG (1000 UT) CAPS Take 1 capsule (1,000 Units total) by mouth daily. 60 capsule 3   Cyanocobalamin  (VITAMIN B 12 PO) Take by mouth.     Efinaconazole  10 % SOLN Apply 1 drop topically daily. 4 mL 11   fluticasone  (FLONASE ) 50 MCG/ACT nasal spray SHAKE LIQUID AND USE 2 SPRAYS IN EACH NOSTRIL DAILY 16 g 1   Multiple Vitamin (MULTIVITAMIN) tablet Take 1 tablet by mouth every 14 (fourteen) days.     Omega-3 Fatty Acids (FISH OIL) 1000 MG CAPS Take by mouth every 14 (fourteen) days.     semaglutide -weight management (WEGOVY ) 1 MG/0.5ML SOAJ SQ injection Inject 1 mg into the skin once a week. 2 mL 0   tamoxifen  (NOLVADEX ) 20 MG tablet TAKE 1 TABLET(20 MG) BY MOUTH DAILY 90 tablet 0   valACYclovir  (VALTREX ) 500 MG tablet Take 4 tablets (2000 mg) by mouth twice a day for 24 hours as needed. 30 tablet 3   Vitamin D , Ergocalciferol , (DRISDOL ) 1.25 MG (50000 UNIT) CAPS capsule Take 1 capsule (50,000 Units total) by mouth every 7 (seven) days. 5 capsule 0   No current facility-administered medications for this visit.    PHYSICAL EXAMINATION: ECOG PERFORMANCE STATUS: 0 - Asymptomatic  Vitals:   01/15/24 1533  BP: 112/70  Pulse: (!) 105  Resp: 16  Temp: 98 F (36.7 C)  SpO2: 99%   Wt Readings from Last 3 Encounters:  01/15/24 253 lb 1.6 oz (114.8 kg)  12/19/23 246 lb (111.6 kg)  10/31/23 239 lb (108.4 kg)     GENERAL:alert, no distress and  comfortable SKIN: skin color, texture, turgor are normal, no rashes or significant lesions EYES: normal, Conjunctiva are pink and non-injected, sclera clear NECK: supple, thyroid  normal size, non-tender, without nodularity LYMPH:  no palpable lymphadenopathy in the cervical, axillary  LUNGS: clear to auscultation and percussion with normal breathing effort HEART: regular rate & rhythm and no murmurs and no lower extremity edema ABDOMEN:abdomen soft, non-tender and normal bowel sounds Musculoskeletal:no cyanosis of digits and no clubbing  NEURO: alert & oriented x 3 with fluent speech, no focal motor/sensory deficits Breasts: Breast inspection showed them to be symmetrical with no nipple discharge, (+) surgical incisions  in both breast. Palpation of the breasts and axilla revealed no obvious mass that I could appreciate.  Physical Exam   LABORATORY DATA:  I have reviewed the data as listed    Latest Ref Rng & Units 01/11/2024    8:52 AM 07/16/2023    8:06 AM 01/15/2023    8:32 AM  CBC  WBC 4.0 - 10.5 K/uL 4.8  5.1  4.3   Hemoglobin 12.0 - 15.0 g/dL 88.1  88.5  88.3   Hematocrit 36.0 - 46.0 % 34.3  33.2  34.1   Platelets 150 - 400 K/uL 322  267  265         Latest Ref Rng & Units 01/11/2024    8:52 AM 07/16/2023    8:06 AM 01/15/2023    8:32 AM  CMP  Glucose 70 - 99 mg/dL 90  95  89   BUN 6 - 20 mg/dL 13  9  10    Creatinine 0.44 - 1.00 mg/dL 9.03  9.05  9.00   Sodium 135 - 145 mmol/L 140  139  140   Potassium 3.5 - 5.1 mmol/L 4.0  3.9  4.1   Chloride 98 - 111 mmol/L 109  110  109   CO2 22 - 32 mmol/L 26  24  27    Calcium 8.9 - 10.3 mg/dL 9.3  8.9  8.8   Total Protein 6.5 - 8.1 g/dL 7.1  7.0  7.1   Total Bilirubin 0.0 - 1.2 mg/dL 0.6  0.6  0.7   Alkaline Phos 38 - 126 U/L 44  39  40   AST 15 - 41 U/L 17  15  20    ALT 0 - 44 U/L 12  14  15        RADIOGRAPHIC STUDIES: I have personally reviewed the radiological images as listed and agreed with the findings in the  report. No results found.    Orders Placed This Encounter  Procedures   Hgb Fractionation Cascade    Standing Status:   Future    Expected Date:   07/14/2024    Expiration Date:   01/14/2025   Intrinsic factor antibodies    Standing Status:   Future    Expected Date:   07/14/2024    Expiration Date:   01/14/2025   CBC with Differential/Platelet    Standing Status:   Standing    Number of Occurrences:   50    Expiration Date:   01/14/2025   Comprehensive metabolic panel with GFR    Standing Status:   Standing    Number of Occurrences:   50    Expiration Date:   01/14/2025   All questions were answered. The patient knows to call the clinic with any problems, questions or concerns. No barriers to learning was detected. The total time spent in the appointment was 25 minutes, including review of chart and various tests results, discussions about plan of care and coordination of care plan     Onita Mattock, MD 01/15/2024

## 2024-01-16 ENCOUNTER — Other Ambulatory Visit: Payer: Self-pay

## 2024-01-16 DIAGNOSIS — Z9189 Other specified personal risk factors, not elsewhere classified: Secondary | ICD-10-CM

## 2024-01-16 DIAGNOSIS — Z1231 Encounter for screening mammogram for malignant neoplasm of breast: Secondary | ICD-10-CM

## 2024-01-16 DIAGNOSIS — C50811 Malignant neoplasm of overlapping sites of right female breast: Secondary | ICD-10-CM

## 2024-01-16 NOTE — Progress Notes (Signed)
 Verbal order w/readback from Dr. Lanny for Contrast Enhanced Bilateral Mammogram for [facility] by 02/13/2024.  Ordered placed and [facility]ul[facility] (Crissie & Heather) notified of the order.

## 2024-01-17 ENCOUNTER — Ambulatory Visit: Admitting: Bariatrics

## 2024-01-17 ENCOUNTER — Encounter: Payer: Self-pay | Admitting: Bariatrics

## 2024-01-17 DIAGNOSIS — R7303 Prediabetes: Secondary | ICD-10-CM | POA: Diagnosis not present

## 2024-01-17 DIAGNOSIS — Z6837 Body mass index (BMI) 37.0-37.9, adult: Secondary | ICD-10-CM | POA: Diagnosis not present

## 2024-01-17 DIAGNOSIS — E66812 Obesity, class 2: Secondary | ICD-10-CM

## 2024-01-17 DIAGNOSIS — R632 Polyphagia: Secondary | ICD-10-CM

## 2024-01-17 DIAGNOSIS — E669 Obesity, unspecified: Secondary | ICD-10-CM

## 2024-01-17 MED ORDER — WEGOVY 1.7 MG/0.75ML ~~LOC~~ SOAJ: 1.7000 mg | SUBCUTANEOUS | 0 refills | Status: DC

## 2024-01-17 NOTE — Progress Notes (Signed)
 WEIGHT SUMMARY AND BIOMETRICS  Weight Lost Since Last Visit: 0  Weight Gained Since Last Visit: 0   Vitals Temp: 98 F (36.7 C) BP: 134/82 Pulse Rate: 83 SpO2: 97 %   Anthropometric Measurements Height: 5' 8 (1.727 m) Weight: 246 lb (111.6 kg) BMI (Calculated): 37.41 Weight at Last Visit: 246lb Weight Lost Since Last Visit: 0 Weight Gained Since Last Visit: 0 Starting Weight: 241lb Total Weight Loss (lbs): 0 lb (0 kg)   Body Composition  Body Fat %: 43.4 % Fat Mass (lbs): 107.2 lbs Muscle Mass (lbs): 132.6 lbs Total Body Water (lbs): 95.6 lbs Visceral Fat Rating : 12   Other Clinical Data Fasting: no Labs: no Today's Visit #: 34 Starting Date: 09/14/21    OBESITY [facility] is here to discuss her progress with her obesity treatment plan along with follow-up of her obesity related diagnoses.    Nutrition Plan: the Category 2 plan - 30% adherence.  Current exercise: walking  Interim History:  Her weight remains the same.  Eating all of the food on the plan., Is not skipping meals, Not journaling consistently., and Water intake is adequate.   Pharmacotherapy: [facility] is on Wegovy  1.0 mg SQ weekly Adverse side effects: None Hunger is moderately controlled. She has some increased hunger  Cravings are moderately controlled.  Assessment/Plan:    Prediabetes Last A1c was 5.5  Medication(s):  Wegovy  1.0 mg SQ weekly Lab Results  Component Value Date   HGBA1C 5.5 05/17/2022   HGBA1C 5.5 09/14/2021   HGBA1C 5.4 06/13/2017   HGBA1C 5.1 07/01/2014   Lab Results  Component Value Date   INSULIN  17.6 05/17/2022   INSULIN  17.5 09/14/2021    Plan: Will minimize all refined carbohydrates both sweets and starches.  Will work on the plan and exercise.  Consider both aerobic and resistance training.  Will keep protein, water, and fiber intake high.   Increase Polyunsaturated and Monounsaturated fats to increase satiety and encourage weight loss.  Aim for 7 to 9 hours of sleep nightly.  Continue and increase dose Wegovy  1.7 SQ weekly   Polyphagia [facility] endorses excessive hunger.  Medication(s): Wegovy  1.7 mg  Effects of medication:  moderately controlled. Cravings are moderately controlled.   Plan: Medication(s): Increase to  Wegovy  1.7 SQ weekly  Will increase water, protein and fiber to help assuage hunger.  Will minimize foods that have a high glucose index/load to minimize reactive hypoglycemia.  Will eat more regular meals.  Will do more meal planning.  Referral to [facility] ( REF [id number]).     Generalized Obesity: Current BMI BMI (Calculated): 37.41   Pharmacotherapy Plan Continue and increase dose  Wegovy  1.7 SQ weekly  [facility] is currently in the action stage of change. As such, her goal is to continue with weight loss efforts.  She has agreed to the Category 2 plan.  Exercise goals: All adults should avoid inactivity. Some physical activity is better than none, and adults who participate in any amount of physical activity gain some health benefits.  Behavioral modification strategies: increasing lean protein intake, decreasing simple carbohydrates , no meal skipping, decrease eating out, meal planning , better snacking choices, planning for success, increasing vegetables, avoiding temptations, keep healthy foods in the home, and increase frequency of journaling.  [facility] has agreed to follow-up with our clinic in 4 weeks.     Objective:   VITALS: Per patient if applicable, see vitals. GENERAL: Alert and in no acute distress. CARDIOPULMONARY: No increased WOB. Speaking in clear sentences.  PSYCH: Pleasant and cooperative. Speech normal rate and rhythm. Affect is appropriate. Insight and judgement are appropriate. Attention is focused, linear, and appropriate.  NEURO: Oriented as arrived to appointment on time with no  prompting.   Attestation Statements:   This was prepared with the assistance of Engineer, Civil (consulting).  Occasional wrong-word or sound-a-like substitutions may have occurred due to the inherent limitations of voice recognition   Clayborne Daring, DO

## 2024-01-22 ENCOUNTER — Telehealth: Payer: Self-pay

## 2024-01-22 NOTE — Telephone Encounter (Signed)
 Called left a voicemail and asked to call back.

## 2024-02-04 ENCOUNTER — Telehealth: Payer: Self-pay

## 2024-02-04 NOTE — Telephone Encounter (Signed)
 Called re: PREP program referral, voicemail box full; sent text message asking her to return my call.

## 2024-02-05 ENCOUNTER — Telehealth: Payer: Self-pay

## 2024-02-05 NOTE — Telephone Encounter (Signed)
 Returned her call, explained PREP; she would like to attend, prefers afternoon at [place]. Will contact in January when next 2pm T/Th class at [facility] is scheduled.

## 2024-02-06 ENCOUNTER — Other Ambulatory Visit (HOSPITAL_COMMUNITY): Payer: Self-pay | Admitting: Endocrinology

## 2024-02-06 DIAGNOSIS — E7800 Pure hypercholesterolemia, unspecified: Secondary | ICD-10-CM

## 2024-02-19 ENCOUNTER — Encounter: Payer: Self-pay | Admitting: Bariatrics

## 2024-02-19 ENCOUNTER — Ambulatory Visit: Admitting: Bariatrics

## 2024-02-19 VITALS — BP 127/80 | HR 82 | Ht 68.0 in | Wt 245.0 lb

## 2024-02-19 DIAGNOSIS — R632 Polyphagia: Secondary | ICD-10-CM

## 2024-02-19 DIAGNOSIS — E782 Mixed hyperlipidemia: Secondary | ICD-10-CM

## 2024-02-19 DIAGNOSIS — Z6837 Body mass index (BMI) 37.0-37.9, adult: Secondary | ICD-10-CM

## 2024-02-19 MED ORDER — WEGOVY 1.7 MG/0.75ML ~~LOC~~ SOAJ: 1.7000 mg | SUBCUTANEOUS | 0 refills | Status: DC

## 2024-02-19 NOTE — Progress Notes (Signed)
 WEIGHT SUMMARY AND BIOMETRICS  Weight Lost Since Last Visit: 1lb  Weight Gained Since Last Visit: 0   Vitals BP: 127/80 Pulse Rate: 82 SpO2: 98 %   Anthropometric Measurements Height: 5' 8 (1.727 m) Weight: 245 lb (111.1 kg) BMI (Calculated): 37.26 Weight at Last Visit: 246lb Weight Lost Since Last Visit: 1lb Weight Gained Since Last Visit: 0 Starting Weight: 241lb Total Weight Loss (lbs): 0 lb (0 kg)   Body Composition  Body Fat %: 43.5 % Fat Mass (lbs): 107 lbs Muscle Mass (lbs): 131.8 lbs Total Body Water (lbs): 99.8 lbs Visceral Fat Rating : 12   Other Clinical Data Fasting: no Labs: no Today's Visit #: 28 Starting Date: 09/14/21    OBESITY Gail Erickson is here to discuss her progress with her obesity treatment plan along with follow-up of her obesity related diagnoses.    Nutrition Plan: the Category 2 plan - 25% adherence.  Current exercise: none  Interim History:  She is down 1 lb since her last visit. She has been under a lot of stress. She is a caregiver.  Not eating all of the food on the plan.   Pharmacotherapy: Gail Erickson is on Wegovy  1.7 SQ weekly Adverse side effects: None Hunger is moderately controlled.  Cravings are moderately controlled.  Assessment/Plan:   Gail Erickson endorses excessive hunger.  Medication(s): Wegovy   Effects of medication:  moderately controlled. Cravings are moderately controlled.   Plan: Medication(s): Wegovy  1.7 SQ weekly Will increase water, protein and fiber to help assuage hunger.  Will minimize foods that have a high glucose index/load to minimize reactive hypoglycemia.  [facility] She is also going to join a program through her insurance where she will be having access to some services such as my fitness pal and other amenities.  Hyperlipidemia LDL  is not at goal. Medication(s): Omega 3 fatty acids.  Cardiovascular risk factors: dyslipidemia, obesity (BMI >= 30 kg/m2), and sedentary lifestyle  Lab Results  Component Value Date   CHOL 173 05/17/2022   HDL 46 05/17/2022   LDLCALC 114 (H) 05/17/2022   TRIG 65 05/17/2022   CHOLHDL 3.8 07/28/2021   Lab Results  Component Value Date   ALT 12 01/11/2024   AST 17 01/11/2024   ALKPHOS 44 01/11/2024   BILITOT 0.6 01/11/2024   The 10-year ASCVD risk score (Arnett DK, et al., 2019) is: 2.3%   Values used to calculate the score:     Age: 48 years     Clincally relevant sex: Female     Is Non-Hispanic African American: Yes     Diabetic: No     Tobacco smoker: No     Systolic Blood Pressure: 127 mmHg     Is BP treated: No     HDL Cholesterol: 41 mg/dL     Total Cholesterol: 174 mg/dL  Plan:  We discussed her last lab work which showed that her LDL was up slightly and her HDL was slightly low. She states that her primary has ordered a coronary calcium imaging in the near future. Will avoid all trans fats.  Will read labels Will minimize saturated fats except the following: low fat meats in moderation, diary, and limited dark chocolate.     Generalized Obesity: Current BMI BMI (Calculated): 37.26   Pharmacotherapy Plan Continue and refill  Wegovy  1.7 SQ weekly  Gail Erickson is currently in the action stage of change. As such, her goal is to continue with weight loss efforts.  She has agreed to the Category 2 plan.  Exercise goals: All adults should avoid inactivity. Some physical activity is better than none, and adults who participate in any amount of physical activity gain some health benefits.  Behavioral modification strategies: increasing lean protein intake, decreasing simple carbohydrates , no meal skipping, meal planning , planning for success, avoiding temptations, keep healthy foods in the home, and holiday eating strategies .  Gail Erickson has agreed to follow-up with our clinic  in 4 weeks.   Objective:   VITALS: Per patient if applicable, see vitals. GENERAL: Alert and in no acute distress. CARDIOPULMONARY: No increased WOB. Speaking in clear sentences.  PSYCH: Pleasant and cooperative. Speech normal rate and rhythm. Affect is appropriate. Insight and judgement are appropriate. Attention is focused, linear, and appropriate.  NEURO: Oriented as arrived to appointment on time with no prompting.   Attestation Statements:   This was prepared with the assistance of Engineer, Civil (consulting).  Occasional wrong-word or sound-a-like substitutions may have occurred due to the inherent limitations of voice recognition.  Gail Daring, DO

## 2024-02-25 ENCOUNTER — Ambulatory Visit (HOSPITAL_BASED_OUTPATIENT_CLINIC_OR_DEPARTMENT_OTHER)
Admission: RE | Admit: 2024-02-25 | Discharge: 2024-02-25 | Disposition: A | Payer: Self-pay | Source: Ambulatory Visit | Attending: Endocrinology | Admitting: Endocrinology

## 2024-02-25 DIAGNOSIS — E7800 Pure hypercholesterolemia, unspecified: Secondary | ICD-10-CM

## 2024-03-12 ENCOUNTER — Other Ambulatory Visit: Payer: Self-pay | Admitting: Family Medicine

## 2024-03-12 DIAGNOSIS — J329 Chronic sinusitis, unspecified: Secondary | ICD-10-CM

## 2024-03-18 DIAGNOSIS — C50811 Malignant neoplasm of overlapping sites of right female breast: Secondary | ICD-10-CM

## 2024-03-19 ENCOUNTER — Ambulatory Visit
Admission: RE | Admit: 2024-03-19 | Discharge: 2024-03-19 | Disposition: A | Discharge status: Home / Self Care | Source: Ambulatory Visit | Attending: Hematology | Admitting: Hematology

## 2024-03-19 DIAGNOSIS — Z9189 Other specified personal risk factors, not elsewhere classified: Secondary | ICD-10-CM

## 2024-03-19 DIAGNOSIS — Z1231 Encounter for screening mammogram for malignant neoplasm of breast: Secondary | ICD-10-CM

## 2024-03-19 DIAGNOSIS — C50811 Malignant neoplasm of overlapping sites of right female breast: Secondary | ICD-10-CM

## 2024-03-19 MED ORDER — IOPAMIDOL (ISOVUE-370) INJECTION 76%
100.0000 mL | Freq: Once | INTRAVENOUS | Status: AC | PRN
  Administered 2024-03-19: 100 mL via INTRAVENOUS

## 2024-03-24 ENCOUNTER — Encounter: Payer: Self-pay | Admitting: Bariatrics

## 2024-03-24 ENCOUNTER — Ambulatory Visit: Admitting: Bariatrics

## 2024-03-24 VITALS — BP 120/72 | HR 74 | Temp 98.1°F | Ht 68.0 in | Wt 242.0 lb

## 2024-03-24 DIAGNOSIS — E782 Mixed hyperlipidemia: Secondary | ICD-10-CM | POA: Diagnosis not present

## 2024-03-24 DIAGNOSIS — R632 Polyphagia: Secondary | ICD-10-CM

## 2024-03-24 DIAGNOSIS — Z6836 Body mass index (BMI) 36.0-36.9, adult: Secondary | ICD-10-CM

## 2024-03-24 DIAGNOSIS — E669 Obesity, unspecified: Secondary | ICD-10-CM

## 2024-03-24 MED ORDER — TIRZEPATIDE-WEIGHT MANAGEMENT 10 MG/0.5ML ~~LOC~~ SOLN: 10.0000 mg | SUBCUTANEOUS | 0 refills | Status: AC

## 2024-03-24 NOTE — Progress Notes (Unsigned)
" ° ° °                                                                                                     °    ° °  WEIGHT SUMMARY AND BIOMETRICS  Weight Lost Since Last Visit: 3 lb  Weight Gained Since Last Visit: 0   Vitals Temp: 98.1 F (36.7 C) BP: 120/72 Pulse Rate: 74 SpO2: 100 %   Anthropometric Measurements Height: 5' 8 (1.727 m) Weight: 242 lb (109.8 kg) BMI (Calculated): 36.8 Weight at Last Visit: 245 lb Weight Lost Since Last Visit: 3 lb Weight Gained Since Last Visit: 0 Starting Weight: 241 lb Total Weight Loss (lbs): 1 lb (0.454 kg)   Body Composition  Body Fat %: 43.1 % Fat Mass (lbs): 104.6 lbs Muscle Mass (lbs): 131 lbs Total Body Water (lbs): 96 lbs Visceral Fat Rating : 12   Other Clinical Data Fasting: no Labs: no Today's Visit #: 91 Starting Date: 09/14/21    OBESITY Gail Erickson is here to discuss her progress with her obesity treatment plan along with follow-up of her obesity related diagnoses.    Nutrition Plan: the Category 2 plan - 30% adherence.  Current exercise: no regular exercise  Interim History:  She is down 3 lbs since her last visit.  Eating all of the food on the plan., Protein intake is as prescribed, Is not skipping meals, Not journaling consistently., and Water intake is adequate.   Pharmacotherapy: Gail Erickson is on Zepbound  10 mg SQ weekly Adverse side effects: {dwwse:29122} Hunger is moderately controlled.  Cravings are moderately controlled.  Assessment/Plan:   Gail Erickson endorses excessive hunger.  Medication(s): *** Effects of medication:  {EWCONTROLASSESSMENT:24261}. Cravings are {EWCONTROLASSESSMENT:24261}.   Plan: Medication(s): {dwwpharmacotherapy:29109} Will increase water, protein and fiber to help assuage hunger.  Will minimize foods that have a high glucose index/load to minimize reactive hypoglycemia.   ***    Generalized Obesity: Current BMI BMI (Calculated): 36.8   Pharmacotherapy  Plan {dwwmed:29123}  {dwwpharmacotherapy:29109}  Evi {CHL AMB IS/IS NOT:210130109} currently in the action stage of change. As such, her goal is to {MWMwtloss#1:210800005}.  She has agreed to {dwwsldiets:29085}.  Exercise goals: {MWM EXERCISE RECS:23473}  Behavioral modification strategies: {dwwslwtlossstrategies:29088}.  Oluwatoyin has agreed to follow-up with our clinic in 4 weeks.      Objective:   VITALS: Per patient if applicable, see vitals. GENERAL: Alert and in no acute distress. CARDIOPULMONARY: No increased WOB. Speaking in clear sentences.  PSYCH: Pleasant and cooperative. Speech normal rate and rhythm. Affect is appropriate. Insight and judgement are appropriate. Attention is focused, linear, and appropriate.  NEURO: Oriented as arrived to appointment on time with no prompting.   Attestation Statements:   This was prepared with the assistance of Engineer, Civil (consulting).  Occasional wrong-word or sound-a-like substitutions may have occurred due to the inherent limitations of voice recognition.   Gail Daring, DO      "

## 2024-03-25 ENCOUNTER — Telehealth: Payer: Self-pay

## 2024-03-25 NOTE — Telephone Encounter (Signed)
 Called mz:[facility]ZE program, offered next 2 pm class at [facility] on 04/15/24, every T/Th at 2pm; assessment visit scheduled for 1/27 at 4pm.

## 2024-04-10 NOTE — Progress Notes (Signed)
 [facility] PRE[facility] Evaluation  Patient Details  Name: Gail Erickson MRN: [id number] Date of Birth: 1975/11/27 Age: 49 y.o. PCP: Edman Meade PEDLAR, FNP  Vitals:   04/10/24 1638  BP: 110/66  Pulse: 75  SpO2: 98%  Weight: 245 lb 9.6 oz (111.4 kg)     [facility] Eval - 04/10/24 1600       [facility] PREP Location   [facility] PREP Location [facility]      Referral    Referring Provider [employer]    Program Start Date 04/15/24      Measurement   Waist Circumference 41.5 inches    Hip Circumference 49.5 inches    Body fat 41 percent      Information for Trainer   Goals --   Lose 10-15 pounds by end of program; establish exercise routine/program   Current Exercise none    Pertinent Medical History --   R breast CA, s/p mastectomy   Current Barriers --   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 4    I have no trouble taking out the trash. 4    I do housework such as vacuuming and dusting on my own without difficulty. 4    I can easily lift a gallon of milk (8lbs). 4    I can easily walk a mile. 4    I have no trouble reaching into high cupboards or reaching down to pick up something from the floor. 4    I do not have trouble doing out-door work such as loss adjuster, chartered, raking leaves, or gardening. 3      Mobility and Daily Activities   I feel younger than my age. 2    I feel independent. 4    I feel energetic. 2    I live an active life.  2    I feel strong. 2    I feel healthy. 2    I feel active as other people my age. 2      How fit and strong are you.   Fit and Strong Total Score 43         Past Medical History:  Diagnosis Date   Abnormal Pap smear of cervix 06/02/2014   LGSIL:neg HR HPV (1st abn. pap)   Back pain    Breast cancer (HCC) 2022   Cancer (HCC)    Cyst of right breast    --stable in 2015 U/S done in [place], [place]    Elevated DHEA 05/21/2015   level = 343   Family history of breast cancer    High cholesterol    HSV-1 (herpes simplex virus 1) infection    oral HSV   Joint pain    Migraine without aura    PCOS (polycystic ovarian syndrome)    Prediabetes 2021   sees Dr.Balan   Swelling of lower extremity    Vitamin D  deficiency 05/21/2015   level = 10   Past Surgical History:  Procedure Laterality Date   BREAST EXCISIONAL BIOPSY Left 10/21/2021   BREAST LUMPECTOMY WITH RADIOACTIVE SEED LOCALIZATION Left 10/21/2021   Procedure: LEFT BREAST RADIOACTIVE SEED LOCALIZED LUMPECTOMY;  Surgeon: [name], MD;  Location: [facility];  Service: General;  Laterality: Left;   BREAST  RECONSTRUCTION WITH PLACEMENT OF TISSUE EXPANDER AND FLEX HD (ACELLULAR HYDRATED DERMIS) Right 04/28/2020   Procedure: IMMEDIATE RIGHT BREAST RECONSTRUCTION WITH PLACEMENT OF TISSUE EXPANDER AND FLEX HD (ACELLULAR HYDRATED DERMIS);  Surgeon: Lowery Estefana RAMAN, DO;  Location: [facility];  Service: Plastics;  Laterality: Right;   COLONOSCOPY WITH PROPOFOL  N/A 06/07/2021   Procedure: COLONOSCOPY WITH PROPOFOL ;  Surgeon: Cindie Carlin POUR, DO;  [facility];  Service: Endoscopy;  Laterality: N/A;  10:30 / ASA 2   COLPOSCOPY     LIPOSUCTION WITH LIPOFILLING Right 02/17/2021   Procedure: Lipofilling of right breast for symmetry;  Surgeon: Lowery Estefana RAMAN, DO;  Location: [facility];  Service: Plastics;  Laterality: Right;  1.5 hour   MASTECTOMY Right 04/28/2020   MASTECTOMY W/ SENTINEL NODE BIOPSY Right 04/28/2020   Procedure: RIGHT MASTECTOMY WITH SENTINEL LYMPH NODE BIOPSY;  Surgeon: [name], MD;  Location: [facility];  Service: General;  Laterality: Right;  PECTORAL BLOCK   MASTOPEXY Left 09/23/2020   Procedure: LEFT BREAST MASTOPEXY;  Surgeon: Lowery Estefana RAMAN, DO;  Location: [facility];  Service: Plastics;  Laterality: Left;    POLYPECTOMY  06/07/2021   Procedure: POLYPECTOMY;  Surgeon: Cindie Carlin POUR, DO;  [facility];  Service: Endoscopy;;   REDUCTION MAMMAPLASTY Left 04/28/2020   REMOVAL OF TISSUE EXPANDER AND PLACEMENT OF IMPLANT Right 09/23/2020   Procedure: REMOVAL OF TISSUE EXPANDER AND PLACEMENT OF IMPLANT;  Surgeon: Lowery Estefana RAMAN, DO;  Location: [facility];  Service: Plastics;  Laterality: Right;  90 min   UMBILICAL HERNIA REPAIR     --as a child   Tobacco Use History[1] To begin PREP class at [facility] on 04/15/24, every T/Th at 2pm Issa Kosmicki B Gyneth Hubka 04/10/2024, 4:41 PM      [1]  Social History Tobacco Use  Smoking Status Never  Smokeless Tobacco Never

## 2024-04-15 NOTE — Progress Notes (Signed)
 [facility] PRE[facility] Weekly Session  Patient Details  Name: Gail Erickson MRN: [id number] Date of Birth: [birth year] Age: 49 y.o. PCP: Edman Meade PEDLAR, FNP  There were no vitals filed for this visit.   [facility] Weekly seesion - 04/15/24 1500       [facility] PREP Location   [facility] PREP Location [facility]      Weekly Session   Topic Discussed Goal setting and welcome to the program   Introductions, review of notebook, tour of facility; offered option to work out on cardio machine   Classes attended to date 1          [name] 04/15/2024, 3:33 PM

## 2024-04-22 ENCOUNTER — Ambulatory Visit: Admitting: Bariatrics

## 2024-07-10 ENCOUNTER — Inpatient Hospital Stay

## 2024-07-14 ENCOUNTER — Inpatient Hospital Stay: Admitting: Hematology

## 2024-07-14 ENCOUNTER — Inpatient Hospital Stay

## 2024-09-08 ENCOUNTER — Ambulatory Visit: Admitting: Obstetrics and Gynecology
